# Patient Record
Sex: Female | Born: 1960
Health system: Southern US, Community
[De-identification: ages and names within clinical notes are randomized; demographics above are authoritative.]

## PROBLEM LIST (undated history)

## (undated) ENCOUNTER — Emergency Department (HOSPITAL_COMMUNITY): Discharge status: Absent without leave | Payer: Medicare HMO

## (undated) DIAGNOSIS — M545 Low back pain, unspecified: Secondary | ICD-10-CM

## (undated) DIAGNOSIS — F329 Major depressive disorder, single episode, unspecified: Secondary | ICD-10-CM

## (undated) DIAGNOSIS — K219 Gastro-esophageal reflux disease without esophagitis: Secondary | ICD-10-CM

## (undated) DIAGNOSIS — R011 Cardiac murmur, unspecified: Secondary | ICD-10-CM

## (undated) DIAGNOSIS — G43909 Migraine, unspecified, not intractable, without status migrainosus: Secondary | ICD-10-CM

## (undated) DIAGNOSIS — I10 Essential (primary) hypertension: Secondary | ICD-10-CM

## (undated) DIAGNOSIS — N390 Urinary tract infection, site not specified: Secondary | ICD-10-CM

## (undated) DIAGNOSIS — J45909 Unspecified asthma, uncomplicated: Secondary | ICD-10-CM

## (undated) DIAGNOSIS — Z9289 Personal history of other medical treatment: Secondary | ICD-10-CM

## (undated) DIAGNOSIS — K253 Acute gastric ulcer without hemorrhage or perforation: Secondary | ICD-10-CM

## (undated) DIAGNOSIS — N189 Chronic kidney disease, unspecified: Secondary | ICD-10-CM

## (undated) DIAGNOSIS — J189 Pneumonia, unspecified organism: Secondary | ICD-10-CM

## (undated) DIAGNOSIS — E119 Type 2 diabetes mellitus without complications: Secondary | ICD-10-CM

## (undated) DIAGNOSIS — G8929 Other chronic pain: Secondary | ICD-10-CM

## (undated) DIAGNOSIS — K224 Dyskinesia of esophagus: Secondary | ICD-10-CM

## (undated) DIAGNOSIS — F32A Depression, unspecified: Secondary | ICD-10-CM

## (undated) DIAGNOSIS — E7800 Pure hypercholesterolemia, unspecified: Secondary | ICD-10-CM

## (undated) DIAGNOSIS — M1990 Unspecified osteoarthritis, unspecified site: Secondary | ICD-10-CM

## (undated) DIAGNOSIS — F419 Anxiety disorder, unspecified: Secondary | ICD-10-CM

## (undated) DIAGNOSIS — B1920 Unspecified viral hepatitis C without hepatic coma: Secondary | ICD-10-CM

## (undated) DIAGNOSIS — IMO0002 Reserved for concepts with insufficient information to code with codable children: Secondary | ICD-10-CM

## (undated) DIAGNOSIS — D649 Anemia, unspecified: Secondary | ICD-10-CM

## (undated) HISTORY — PX: HEEL SPUR EXCISION: SHX1733

## (undated) HISTORY — PX: TONSILLECTOMY: SUR1361

## (undated) HISTORY — DX: Gastro-esophageal reflux disease without esophagitis: K21.9

## (undated) HISTORY — PX: TUBAL LIGATION: SHX77

## (undated) HISTORY — PX: ABDOMINAL HYSTERECTOMY: SHX81

## (undated) HISTORY — PX: APPENDECTOMY: SHX54

## (undated) HISTORY — DX: Unspecified osteoarthritis, unspecified site: M19.90

## (undated) HISTORY — DX: Acute gastric ulcer without hemorrhage or perforation: K25.3

## (undated) HISTORY — DX: Dyskinesia of esophagus: K22.4

## (undated) HISTORY — PX: CARPAL TUNNEL RELEASE: SHX101

## (undated) HISTORY — PX: FOOT FRACTURE SURGERY: SHX645

## (undated) HISTORY — PX: CARDIAC CATHETERIZATION: SHX172

## (undated) HISTORY — DX: Essential (primary) hypertension: I10

## (undated) HISTORY — DX: Anemia, unspecified: D64.9

## (undated) HISTORY — PX: CHOLECYSTECTOMY: SHX55

## (undated) HISTORY — DX: Reserved for concepts with insufficient information to code with codable children: IMO0002

---

## 1999-01-23 ENCOUNTER — Encounter: Payer: Self-pay | Admitting: Emergency Medicine

## 1999-01-23 ENCOUNTER — Emergency Department (HOSPITAL_COMMUNITY): Admission: EM | Admit: 1999-01-23 | Discharge: 1999-01-23 | Payer: Self-pay | Admitting: Emergency Medicine

## 1999-07-26 ENCOUNTER — Inpatient Hospital Stay (HOSPITAL_COMMUNITY): Admission: EM | Admit: 1999-07-26 | Discharge: 1999-07-28 | Payer: Self-pay | Admitting: Internal Medicine

## 1999-07-26 ENCOUNTER — Encounter (INDEPENDENT_AMBULATORY_CARE_PROVIDER_SITE_OTHER): Payer: Self-pay | Admitting: *Deleted

## 1999-07-27 ENCOUNTER — Encounter: Payer: Self-pay | Admitting: Family Medicine

## 1999-07-27 ENCOUNTER — Encounter: Payer: Self-pay | Admitting: Internal Medicine

## 1999-08-22 ENCOUNTER — Ambulatory Visit (HOSPITAL_COMMUNITY): Admission: RE | Admit: 1999-08-22 | Discharge: 1999-08-22 | Payer: Self-pay | Admitting: Gastroenterology

## 1999-11-06 ENCOUNTER — Emergency Department (HOSPITAL_COMMUNITY): Admission: EM | Admit: 1999-11-06 | Discharge: 1999-11-06 | Payer: Self-pay | Admitting: Emergency Medicine

## 1999-11-19 ENCOUNTER — Emergency Department (HOSPITAL_COMMUNITY): Admission: EM | Admit: 1999-11-19 | Discharge: 1999-11-19 | Payer: Self-pay | Admitting: *Deleted

## 1999-12-01 ENCOUNTER — Encounter: Payer: Self-pay | Admitting: Gastroenterology

## 1999-12-01 ENCOUNTER — Ambulatory Visit (HOSPITAL_COMMUNITY): Admission: RE | Admit: 1999-12-01 | Discharge: 1999-12-01 | Payer: Self-pay | Admitting: Gastroenterology

## 1999-12-08 ENCOUNTER — Encounter: Payer: Self-pay | Admitting: Emergency Medicine

## 1999-12-08 ENCOUNTER — Emergency Department (HOSPITAL_COMMUNITY): Admission: EM | Admit: 1999-12-08 | Discharge: 1999-12-09 | Payer: Self-pay | Admitting: Emergency Medicine

## 1999-12-15 ENCOUNTER — Ambulatory Visit (HOSPITAL_COMMUNITY): Admission: RE | Admit: 1999-12-15 | Discharge: 1999-12-15 | Payer: Self-pay | Admitting: Gastroenterology

## 1999-12-18 ENCOUNTER — Emergency Department (HOSPITAL_COMMUNITY): Admission: EM | Admit: 1999-12-18 | Discharge: 1999-12-19 | Payer: Self-pay | Admitting: Emergency Medicine

## 2000-01-19 ENCOUNTER — Emergency Department (HOSPITAL_COMMUNITY): Admission: EM | Admit: 2000-01-19 | Discharge: 2000-01-19 | Payer: Self-pay | Admitting: Emergency Medicine

## 2000-01-19 ENCOUNTER — Encounter: Payer: Self-pay | Admitting: Emergency Medicine

## 2000-04-05 ENCOUNTER — Ambulatory Visit (HOSPITAL_COMMUNITY): Admission: RE | Admit: 2000-04-05 | Discharge: 2000-04-05 | Payer: Self-pay | Admitting: Gastroenterology

## 2001-06-20 ENCOUNTER — Ambulatory Visit (HOSPITAL_COMMUNITY): Admission: RE | Admit: 2001-06-20 | Discharge: 2001-06-20 | Payer: Self-pay | Admitting: Gastroenterology

## 2001-06-25 ENCOUNTER — Emergency Department (HOSPITAL_COMMUNITY): Admission: EM | Admit: 2001-06-25 | Discharge: 2001-06-25 | Payer: Self-pay | Admitting: Emergency Medicine

## 2001-06-25 ENCOUNTER — Encounter: Payer: Self-pay | Admitting: Emergency Medicine

## 2001-10-21 ENCOUNTER — Encounter: Payer: Self-pay | Admitting: Emergency Medicine

## 2001-10-21 ENCOUNTER — Emergency Department (HOSPITAL_COMMUNITY): Admission: EM | Admit: 2001-10-21 | Discharge: 2001-10-22 | Payer: Self-pay | Admitting: Emergency Medicine

## 2001-10-22 ENCOUNTER — Encounter: Payer: Self-pay | Admitting: Emergency Medicine

## 2001-10-27 ENCOUNTER — Inpatient Hospital Stay (HOSPITAL_COMMUNITY): Admission: AD | Admit: 2001-10-27 | Discharge: 2001-10-27 | Payer: Self-pay | Admitting: Obstetrics and Gynecology

## 2001-12-04 ENCOUNTER — Encounter (INDEPENDENT_AMBULATORY_CARE_PROVIDER_SITE_OTHER): Payer: Self-pay | Admitting: *Deleted

## 2001-12-04 ENCOUNTER — Observation Stay (HOSPITAL_COMMUNITY): Admission: RE | Admit: 2001-12-04 | Discharge: 2001-12-05 | Payer: Self-pay | Admitting: Obstetrics and Gynecology

## 2002-07-31 ENCOUNTER — Ambulatory Visit (HOSPITAL_COMMUNITY): Admission: RE | Admit: 2002-07-31 | Discharge: 2002-07-31 | Payer: Self-pay | Admitting: Gastroenterology

## 2002-08-31 ENCOUNTER — Emergency Department (HOSPITAL_COMMUNITY): Admission: EM | Admit: 2002-08-31 | Discharge: 2002-08-31 | Payer: Self-pay | Admitting: Emergency Medicine

## 2003-07-01 ENCOUNTER — Encounter: Payer: Self-pay | Admitting: Gastroenterology

## 2003-07-01 ENCOUNTER — Ambulatory Visit (HOSPITAL_COMMUNITY): Admission: RE | Admit: 2003-07-01 | Discharge: 2003-07-01 | Payer: Self-pay | Admitting: Gastroenterology

## 2004-02-11 ENCOUNTER — Ambulatory Visit: Payer: Self-pay | Admitting: Gastroenterology

## 2004-02-16 ENCOUNTER — Ambulatory Visit (HOSPITAL_COMMUNITY): Admission: RE | Admit: 2004-02-16 | Discharge: 2004-02-16 | Payer: Self-pay | Admitting: Gastroenterology

## 2004-02-16 ENCOUNTER — Ambulatory Visit: Payer: Self-pay | Admitting: Gastroenterology

## 2004-03-15 ENCOUNTER — Ambulatory Visit: Payer: Self-pay | Admitting: Gastroenterology

## 2004-03-20 ENCOUNTER — Ambulatory Visit: Payer: Self-pay | Admitting: Gastroenterology

## 2004-03-20 ENCOUNTER — Ambulatory Visit (HOSPITAL_COMMUNITY): Admission: RE | Admit: 2004-03-20 | Discharge: 2004-03-20 | Payer: Self-pay | Admitting: Gastroenterology

## 2004-03-21 ENCOUNTER — Ambulatory Visit (HOSPITAL_COMMUNITY): Admission: RE | Admit: 2004-03-21 | Discharge: 2004-03-21 | Payer: Self-pay | Admitting: Gastroenterology

## 2004-04-05 ENCOUNTER — Ambulatory Visit: Payer: Self-pay | Admitting: Gastroenterology

## 2004-04-26 ENCOUNTER — Ambulatory Visit: Payer: Self-pay | Admitting: Gastroenterology

## 2004-05-11 ENCOUNTER — Ambulatory Visit: Payer: Self-pay | Admitting: Gastroenterology

## 2004-05-16 ENCOUNTER — Emergency Department (HOSPITAL_COMMUNITY): Admission: EM | Admit: 2004-05-16 | Discharge: 2004-05-16 | Payer: Self-pay | Admitting: Emergency Medicine

## 2004-05-16 ENCOUNTER — Ambulatory Visit (HOSPITAL_COMMUNITY): Admission: RE | Admit: 2004-05-16 | Discharge: 2004-05-16 | Payer: Self-pay | Admitting: Obstetrics & Gynecology

## 2004-05-16 ENCOUNTER — Ambulatory Visit: Payer: Self-pay | Admitting: Internal Medicine

## 2004-07-19 ENCOUNTER — Emergency Department (HOSPITAL_COMMUNITY): Admission: EM | Admit: 2004-07-19 | Discharge: 2004-07-19 | Payer: Self-pay | Admitting: Emergency Medicine

## 2006-01-20 ENCOUNTER — Emergency Department (HOSPITAL_COMMUNITY): Admission: EM | Admit: 2006-01-20 | Discharge: 2006-01-21 | Payer: Self-pay | Admitting: Internal Medicine

## 2006-05-02 ENCOUNTER — Ambulatory Visit: Payer: Self-pay | Admitting: Gastroenterology

## 2007-09-06 ENCOUNTER — Other Ambulatory Visit: Payer: Self-pay | Admitting: Family Medicine

## 2007-09-06 ENCOUNTER — Emergency Department (HOSPITAL_COMMUNITY): Admission: EM | Admit: 2007-09-06 | Discharge: 2007-09-06 | Payer: Self-pay | Admitting: Emergency Medicine

## 2008-02-04 ENCOUNTER — Encounter: Payer: Self-pay | Admitting: Gastroenterology

## 2008-03-04 ENCOUNTER — Encounter: Payer: Self-pay | Admitting: Gastroenterology

## 2008-03-09 ENCOUNTER — Telehealth: Payer: Self-pay | Admitting: Gastroenterology

## 2008-03-10 ENCOUNTER — Telehealth: Payer: Self-pay | Admitting: Gastroenterology

## 2008-03-10 DIAGNOSIS — J42 Unspecified chronic bronchitis: Secondary | ICD-10-CM

## 2008-03-10 DIAGNOSIS — B182 Chronic viral hepatitis C: Secondary | ICD-10-CM | POA: Insufficient documentation

## 2008-03-10 DIAGNOSIS — K224 Dyskinesia of esophagus: Secondary | ICD-10-CM

## 2008-03-10 DIAGNOSIS — I10 Essential (primary) hypertension: Secondary | ICD-10-CM

## 2008-03-10 DIAGNOSIS — M129 Arthropathy, unspecified: Secondary | ICD-10-CM

## 2008-03-10 HISTORY — DX: Dyskinesia of esophagus: K22.4

## 2008-03-10 HISTORY — DX: Essential (primary) hypertension: I10

## 2008-03-10 HISTORY — DX: Unspecified chronic bronchitis: J42

## 2008-03-10 HISTORY — DX: Arthropathy, unspecified: M12.9

## 2008-03-10 HISTORY — DX: Chronic viral hepatitis C: B18.2

## 2008-03-11 ENCOUNTER — Encounter: Payer: Self-pay | Admitting: Gastroenterology

## 2008-03-15 ENCOUNTER — Telehealth: Payer: Self-pay | Admitting: Gastroenterology

## 2008-03-15 ENCOUNTER — Ambulatory Visit: Payer: Self-pay | Admitting: Gastroenterology

## 2008-03-16 ENCOUNTER — Encounter: Payer: Self-pay | Admitting: Gastroenterology

## 2008-03-24 ENCOUNTER — Telehealth: Payer: Self-pay | Admitting: Gastroenterology

## 2008-03-25 ENCOUNTER — Ambulatory Visit (HOSPITAL_COMMUNITY): Admission: RE | Admit: 2008-03-25 | Discharge: 2008-03-25 | Payer: Self-pay | Admitting: Gastroenterology

## 2008-03-25 ENCOUNTER — Encounter: Payer: Self-pay | Admitting: Gastroenterology

## 2008-03-25 ENCOUNTER — Ambulatory Visit: Payer: Self-pay | Admitting: Gastroenterology

## 2008-03-26 ENCOUNTER — Telehealth: Payer: Self-pay | Admitting: Gastroenterology

## 2008-03-29 ENCOUNTER — Encounter: Payer: Self-pay | Admitting: Gastroenterology

## 2008-04-07 ENCOUNTER — Encounter: Payer: Self-pay | Admitting: Gastroenterology

## 2008-04-08 ENCOUNTER — Telehealth: Payer: Self-pay | Admitting: Gastroenterology

## 2008-04-23 ENCOUNTER — Ambulatory Visit: Payer: Self-pay | Admitting: Internal Medicine

## 2008-04-26 ENCOUNTER — Ambulatory Visit: Payer: Self-pay | Admitting: Gastroenterology

## 2008-04-29 ENCOUNTER — Ambulatory Visit (HOSPITAL_COMMUNITY): Admission: RE | Admit: 2008-04-29 | Discharge: 2008-04-29 | Payer: Self-pay | Admitting: Gastroenterology

## 2008-04-29 ENCOUNTER — Ambulatory Visit: Payer: Self-pay | Admitting: Gastroenterology

## 2008-06-14 ENCOUNTER — Ambulatory Visit: Payer: Self-pay | Admitting: Gastroenterology

## 2008-11-10 ENCOUNTER — Encounter: Payer: Self-pay | Admitting: Nurse Practitioner

## 2008-11-18 ENCOUNTER — Encounter: Payer: Self-pay | Admitting: Nurse Practitioner

## 2008-11-24 ENCOUNTER — Encounter: Payer: Self-pay | Admitting: Nurse Practitioner

## 2008-12-02 ENCOUNTER — Encounter: Payer: Self-pay | Admitting: Nurse Practitioner

## 2008-12-10 ENCOUNTER — Encounter: Payer: Self-pay | Admitting: Nurse Practitioner

## 2008-12-14 ENCOUNTER — Encounter: Payer: Self-pay | Admitting: Nurse Practitioner

## 2008-12-17 ENCOUNTER — Telehealth: Payer: Self-pay | Admitting: Gastroenterology

## 2008-12-20 ENCOUNTER — Ambulatory Visit: Payer: Self-pay | Admitting: Gastroenterology

## 2008-12-20 DIAGNOSIS — R112 Nausea with vomiting, unspecified: Secondary | ICD-10-CM

## 2008-12-20 DIAGNOSIS — R1031 Right lower quadrant pain: Secondary | ICD-10-CM | POA: Insufficient documentation

## 2008-12-20 HISTORY — DX: Nausea with vomiting, unspecified: R11.2

## 2008-12-20 LAB — CONVERTED CEMR LAB
Basophils Absolute: 0.1 10*3/uL (ref 0.0–0.1)
Basophils Relative: 0.6 % (ref 0.0–3.0)
Eosinophils Absolute: 0.2 10*3/uL (ref 0.0–0.7)
Eosinophils Relative: 1.4 % (ref 0.0–5.0)
HDL goal, serum: 40 mg/dL
Hemoglobin: 13.3 g/dL (ref 12.0–15.0)
Lymphocytes Relative: 27.9 % (ref 12.0–46.0)
Lymphs Abs: 3.2 10*3/uL (ref 0.7–4.0)
Monocytes Relative: 4.9 % (ref 3.0–12.0)
Neutro Abs: 7.3 10*3/uL (ref 1.4–7.7)
Neutrophils Relative %: 65.2 % (ref 43.0–77.0)

## 2009-01-24 ENCOUNTER — Ambulatory Visit: Payer: Self-pay | Admitting: Gastroenterology

## 2009-01-24 ENCOUNTER — Encounter (INDEPENDENT_AMBULATORY_CARE_PROVIDER_SITE_OTHER): Payer: Self-pay | Admitting: *Deleted

## 2009-01-24 DIAGNOSIS — R1310 Dysphagia, unspecified: Secondary | ICD-10-CM | POA: Insufficient documentation

## 2009-02-08 ENCOUNTER — Ambulatory Visit: Payer: Self-pay | Admitting: Gastroenterology

## 2009-02-08 ENCOUNTER — Ambulatory Visit (HOSPITAL_COMMUNITY): Admission: RE | Admit: 2009-02-08 | Discharge: 2009-02-08 | Payer: Self-pay | Admitting: Gastroenterology

## 2009-02-14 ENCOUNTER — Ambulatory Visit (HOSPITAL_COMMUNITY): Admission: RE | Admit: 2009-02-14 | Discharge: 2009-02-14 | Payer: Self-pay | Admitting: Gastroenterology

## 2009-02-14 ENCOUNTER — Telehealth: Payer: Self-pay | Admitting: Gastroenterology

## 2009-02-15 ENCOUNTER — Telehealth: Payer: Self-pay | Admitting: Gastroenterology

## 2009-03-03 ENCOUNTER — Telehealth: Payer: Self-pay | Admitting: Gastroenterology

## 2009-03-07 ENCOUNTER — Telehealth: Payer: Self-pay | Admitting: Gastroenterology

## 2009-03-08 ENCOUNTER — Telehealth: Payer: Self-pay | Admitting: Gastroenterology

## 2009-03-09 ENCOUNTER — Encounter: Payer: Self-pay | Admitting: Physician Assistant

## 2009-03-09 ENCOUNTER — Ambulatory Visit: Payer: Self-pay | Admitting: Gastroenterology

## 2009-03-09 DIAGNOSIS — R0789 Other chest pain: Secondary | ICD-10-CM

## 2009-03-09 DIAGNOSIS — R1310 Dysphagia, unspecified: Secondary | ICD-10-CM

## 2009-03-09 HISTORY — DX: Other chest pain: R07.89

## 2009-03-09 HISTORY — DX: Dysphagia, unspecified: R13.10

## 2009-03-15 ENCOUNTER — Telehealth: Payer: Self-pay | Admitting: Physician Assistant

## 2009-03-16 ENCOUNTER — Telehealth: Payer: Self-pay | Admitting: Gastroenterology

## 2009-03-17 ENCOUNTER — Ambulatory Visit: Payer: Self-pay | Admitting: Gastroenterology

## 2009-03-17 ENCOUNTER — Ambulatory Visit (HOSPITAL_COMMUNITY): Admission: RE | Admit: 2009-03-17 | Discharge: 2009-03-17 | Payer: Self-pay | Admitting: Gastroenterology

## 2009-03-18 ENCOUNTER — Telehealth: Payer: Self-pay | Admitting: Gastroenterology

## 2009-03-22 ENCOUNTER — Telehealth: Payer: Self-pay | Admitting: Gastroenterology

## 2009-07-25 ENCOUNTER — Ambulatory Visit: Payer: Self-pay | Admitting: Gastroenterology

## 2009-08-23 ENCOUNTER — Ambulatory Visit (HOSPITAL_COMMUNITY): Admission: RE | Admit: 2009-08-23 | Discharge: 2009-08-23 | Payer: Self-pay | Admitting: Gastroenterology

## 2009-08-23 ENCOUNTER — Ambulatory Visit: Payer: Self-pay | Admitting: Gastroenterology

## 2009-08-24 ENCOUNTER — Telehealth: Payer: Self-pay | Admitting: Gastroenterology

## 2009-08-25 ENCOUNTER — Ambulatory Visit: Payer: Self-pay | Admitting: Internal Medicine

## 2009-08-26 ENCOUNTER — Telehealth: Payer: Self-pay | Admitting: Nurse Practitioner

## 2009-09-02 ENCOUNTER — Telehealth: Payer: Self-pay | Admitting: Gastroenterology

## 2009-09-05 ENCOUNTER — Encounter: Payer: Self-pay | Admitting: Gastroenterology

## 2009-09-05 ENCOUNTER — Encounter: Payer: Self-pay | Admitting: Physician Assistant

## 2009-09-05 ENCOUNTER — Ambulatory Visit: Payer: Self-pay | Admitting: Internal Medicine

## 2009-09-05 DIAGNOSIS — R1013 Epigastric pain: Secondary | ICD-10-CM | POA: Insufficient documentation

## 2009-09-05 DIAGNOSIS — E1149 Type 2 diabetes mellitus with other diabetic neurological complication: Secondary | ICD-10-CM

## 2009-09-05 HISTORY — DX: Type 2 diabetes mellitus with other diabetic neurological complication: E11.49

## 2009-09-06 ENCOUNTER — Ambulatory Visit (HOSPITAL_COMMUNITY): Admission: RE | Admit: 2009-09-06 | Discharge: 2009-09-06 | Payer: Self-pay | Admitting: Internal Medicine

## 2009-09-07 ENCOUNTER — Telehealth: Payer: Self-pay | Admitting: Gastroenterology

## 2009-09-09 ENCOUNTER — Ambulatory Visit (HOSPITAL_COMMUNITY): Admission: RE | Admit: 2009-09-09 | Discharge: 2009-09-09 | Payer: Self-pay | Admitting: Gastroenterology

## 2009-09-09 ENCOUNTER — Telehealth: Payer: Self-pay | Admitting: Internal Medicine

## 2009-09-09 ENCOUNTER — Encounter: Payer: Self-pay | Admitting: Gastroenterology

## 2009-09-10 ENCOUNTER — Emergency Department (HOSPITAL_COMMUNITY): Admission: EM | Admit: 2009-09-10 | Discharge: 2009-09-11 | Payer: Self-pay | Admitting: Emergency Medicine

## 2009-09-10 ENCOUNTER — Telehealth: Payer: Self-pay | Admitting: Internal Medicine

## 2009-09-12 ENCOUNTER — Telehealth: Payer: Self-pay | Admitting: Gastroenterology

## 2009-09-15 ENCOUNTER — Telehealth: Payer: Self-pay | Admitting: Gastroenterology

## 2009-09-16 ENCOUNTER — Telehealth: Payer: Self-pay | Admitting: Gastroenterology

## 2009-10-07 ENCOUNTER — Telehealth: Payer: Self-pay | Admitting: Gastroenterology

## 2009-10-21 ENCOUNTER — Telehealth: Payer: Self-pay | Admitting: Gastroenterology

## 2009-11-16 ENCOUNTER — Telehealth: Payer: Self-pay | Admitting: Gastroenterology

## 2010-03-04 ENCOUNTER — Encounter: Payer: Self-pay | Admitting: Obstetrics and Gynecology

## 2010-03-05 ENCOUNTER — Encounter: Payer: Self-pay | Admitting: Gastroenterology

## 2010-03-14 NOTE — Progress Notes (Signed)
Summary: Med refill  Phone Note Refill Request Message from:  Fax from Pharmacy on October 07, 2009 2:06 PM  Refills Requested: Medication #1:  VICODIN 5-500 MG TABS 1 by mouth every 6 hours as needed for pain.   Dosage confirmed as above?Dosage Confirmed   Brand Name Necessary? No  Method Requested: Fax to Local Pharmacy Initial call taken by: Genella Mech CMA ([place]),  October 07, 2009 2:08 PM    New/Updated Medications: VICODIN 5-500 MG TABS (HYDROCODONE-ACETAMINOPHEN) 1 by mouth every 6 hours as needed for pain Prescriptions: VICODIN 5-500 MG TABS (HYDROCODONE-ACETAMINOPHEN) 1 by mouth every 6 hours as needed for pain  #30 x 0   Entered by:   Genella Mech CMA ([place])   Authorized by:   Inda Castle MD   Signed by:   Genella Mech CMA ([place]) on 10/07/2009   Method used:   Historical   RxIDLP:[id number]

## 2010-03-14 NOTE — Procedures (Signed)
Summary: Upper Endoscopy  Patient: [name] [name] Note: All result statuses are Final unless otherwise noted.  Tests: (1) Upper Endoscopy (EGD)   EGD Upper Endoscopy       DONE     [facility]     [place], [place]  [address]           ENDOSCOPY PROCEDURE REPORT           PATIENT:  [name], [name]  MR#:  [id number]     BIRTHDATE:  [birth year], 21 yrs. old  GENDER:  female           ENDOSCOPIST:  Sandy Salaam. Deatra Ina, MD     Referred by:           PROCEDURE DATE:  09/09/2009     PROCEDURE:  EGD with balloon dilatation, EGD w/botox injection     ASA CLASS:  Class II     INDICATIONS:  chest pain           MEDICATIONS:   Fentanyl 75 mcg IV, Versed 5 mg IV, glycopyrrolate     (Robinal) 0.2 mg IV, Benadryl 50 mg IV     TOPICAL ANESTHETIC:  Cetacaine Spray           DESCRIPTION OF PROCEDURE:   After the risks benefits and     alternatives of the procedure were thoroughly explained, informed     consent was obtained.  The  endoscope was introduced through the     mouth and advanced to the third portion of the duodenum, without     limitations.  The instrument was slowly withdrawn as the mucosa     was fully examined.           The upper, middle, and distal third of the esophagus were     carefully inspected and no abnormalities were noted. The z-line     was well seen at the GEJ. The endoscope was pushed into the fundus     which was normal including a retroflexed view. The antrum,gastric     body, first and second part of the duodenum were unremarkable.     balloon dilation botox injection 52mm for 30 seconds; minimal     resistance; no heme     10 units (1cc) injected submucosally every 1cm beginning at GE     junction. Total 100 units were injected.    Retroflexed views     revealed no abnormalities.    The scope was then withdrawn from     the patient and the procedure completed.           COMPLICATIONS:  None           ENDOSCOPIC IMPRESSION:     1)  Diffuse esophageal spasm - s/p balloon dilitation and botox     injection     RECOMMENDATIONS:OV 1 month           REPEAT EXAM:  No           ______________________________     Sandy Salaam. Deatra Ina, MD           CC:  [name] MD           n.     Lorrin MaisSandy Salaam. Monroe Qin at 09/09/2009 12:59 PM           [name] [name], [id number]  Note: An exclamation mark (!) indicates a result that was not dispersed into the flowsheet.  Document Creation Date: 09/09/2009 1:00 PM _______________________________________________________________________  (1) Order result status: Final Collection or observation date-time: 09/09/2009 12:55 Requested date-time:  Receipt date-time:  Reported date-time:  Referring Physician:   Ordering Physician: Erskine Emery [phone]) Specimen Source:  Source: Tawanna Cooler Order Number: [id number] Lab site:

## 2010-03-14 NOTE — Letter (Signed)
Summary: Diabetic Instructions  [facility]  [place], [place] [address]   Phone: [phone]  Fax: [phone]    [name] [name] April 23, 1960 MRN: [id number]   X   ORAL DIABETIC MEDICATION INSTRUCTIONS  The day before your procedure:   Take your diabetic pill as you do normally  The day of your procedure:   Do not take your diabetic pill    We will check your blood sugar levels during the admission process and again in Recovery before discharging you home  ________________________________________________________________________  _  _   INSULIN (LONG ACTING) MEDICATION INSTRUCTIONS (Lantus, NPH, 70/30, Humulin, Novolin-N)   The day before your procedure:   Take  your regular evening dose    The day of your procedure:   Do not take your morning dose    _  _   INSULIN (SHORT ACTING) MEDICATION INSTRUCTIONS (Regular, Humulog, Novolog)   The day before your procedure:   Do not take your evening dose   The day of your procedure:   Do not take your morning dose   _  _   INSULIN PUMP MEDICATION INSTRUCTIONS  We will contact the physician managing your diabetic care for written dosage instructions for the day before your procedure and the day of your procedure.  Once we have received the instructions, we will contact you.

## 2010-03-14 NOTE — Letter (Signed)
Summary: EGD Instructions  [facility]  [place], [place] [address]   Phone: [phone]  Fax: [phone]       [name] [name]    12-02-1960    MRN: [id number]       Procedure Day /Date:03-16-09     Arrival Time: 7:00 AM     Procedure Time: 8:00 AM     Location of Procedure:                     X    [facility] ( Outpatient Registration)    PREPARATION FOR ENDOSCOPY   On 03-16-09 THE DAY OF THE PROCEDURE:  1.   No solid foods, milk or milk products are allowed after midnight the night before your procedure.  2.   Do not drink anything colored red or purple.  Avoid juices with pulp.  No orange juice.  3.  You may drink clear liquids until 4:00 AM, which is 2 hours before your procedure.                                                                                                CLEAR LIQUIDS INCLUDE: Water Jello Ice Popsicles Tea (sugar ok, no milk/cream) Powdered fruit flavored drinks Coffee (sugar ok, no milk/cream) Gatorade Juice: apple, white grape, white cranberry  Lemonade Clear bullion, consomm, broth Carbonated beverages (any kind) Strained chicken noodle soup Hard Candy   MEDICATION INSTRUCTIONS  Unless otherwise instructed, you should take regular prescription medications with a small sip of water as early as possible the morning of your procedure.  Diabetic patients - see separate instructions.           OTHER INSTRUCTIONS  You will need a responsible adult at least 49 years of age to accompany you and drive you home.   This person must remain in the waiting room during your procedure.  Wear loose fitting clothing that is easily removed.  Leave jewelry and other valuables at home.  However, you may wish to bring a book to read or an iPod/MP3 player to listen to music as you wait for your procedure to start.  Remove all body piercing jewelry and leave at home.  Total time from sign-in until discharge is  approximately 2-3 hours.  You should go home directly after your procedure and rest.  You can resume normal activities the day after your procedure.  The day of your procedure you should not:   Drive   Make legal decisions   Operate machinery   Drink alcohol   Return to work  You will receive specific instructions about eating, activities and medications before you leave.    The above instructions have been reviewed and explained to me by   _______________________    I fully understand and can verbalize these instructions _____________________________ Date _________

## 2010-03-14 NOTE — Letter (Signed)
Summary: EGD Instructions  [facility]  [place], [place] [address]   Phone: [phone]  Fax: [phone]       [name] [name]    October 09, 1960    MRN: [id number]       Procedure Day /Date:FRIDAY 09/09/2009     Arrival Time: 11:30AM     Procedure Time:12:30PM     Location of Procedure:                     X  [facility] ( Outpatient Registration)   PREPARATION FOR ENDOSCOPY/BALLOON/BOTOX   On7/29/2011  THE DAY OF THE PROCEDURE:  1.   No solid foods, milk or milk products are allowed after midnight the night before your procedure.  2.   Do not drink anything colored red or purple.  Avoid juices with pulp.  No orange juice.  3.  You may drink clear liquids until8:30AM, which is 4  hours before your procedure.                                                                                                CLEAR LIQUIDS INCLUDE: Water Jello Ice Popsicles Tea (sugar ok, no milk/cream) Powdered fruit flavored drinks Coffee (sugar ok, no milk/cream) Gatorade Juice: apple, white grape, white cranberry  Lemonade Clear bullion, consomm, broth Carbonated beverages (any kind) Strained chicken noodle soup Hard Candy   MEDICATION INSTRUCTIONS  Unless otherwise instructed, you should take regular prescription medications with a small sip of water as early as possible the morning of your procedure.  Diabetic patients - see separate instructions.           OTHER INSTRUCTIONS  You will need a responsible adult at least 49 years of age to accompany you and drive you home.   This person must remain in the waiting room during your procedure.  Wear loose fitting clothing that is easily removed.  Leave jewelry and other valuables at home.  However, you may wish to bring a book to read or an iPod/MP3 player to listen to music as you wait for your procedure to start.  Remove all body piercing jewelry and leave at home.  Total time from sign-in  until discharge is approximately 2-3 hours.  You should go home directly after your procedure and rest.  You can resume normal activities the day after your procedure.  The day of your procedure you should not:   Drive   Make legal decisions   Operate machinery   Drink alcohol   Return to work  You will receive specific instructions about eating, activities and medications before you leave.    The above instructions have been reviewed and explained to me by   _______________________    I fully understand and can verbalize these instructions _____________________________ Date _________

## 2010-03-14 NOTE — Progress Notes (Signed)
Summary: Meds not working  Phone Note Call from Patient Call back at [employer] [phone]   Call For: Tye Savoy, NP Summary of Call: Medicine she was given yesterday is not working. Initial call taken by: Irwin Brakeman [facility],  August 26, 2009 10:28 AM  Follow-up for Phone Call        Per Nevin Bloodgood, she needs to continue the Carafate.  Nevin Bloodgood had me send to [facility] in [place] the Viscous Lidocaine 2 % , she is to take 5 cc, swish and swallow before each meal for 10 day and i sent 1 refill.  The pt understood this and I asked her to call me Mon or Tues of next week with a progress report. Follow-up by: Sharol Roussel,  August 26, 2009 11:22 AM    New/Updated Medications: LIDOCAINE VISCOUS 2 % SOLN (LIDOCAINE HCL) Take 5 cc and swish and swallow before each meal. Prescriptions: LIDOCAINE VISCOUS 2 % SOLN (LIDOCAINE HCL) Take 5 cc and swish and swallow before each meal.  #150 cc x 1   Entered by:   Marisue Humble NCMA   Authorized by:   Tye Savoy NP   Signed by:   Marisue Humble NCMA on 08/26/2009   Method used:   Electronically to        [facility] (retail)       [address]       [place], [place]  [address]       Ph: [phone]       Fax: [phone]   RxID:   [id number]

## 2010-03-14 NOTE — Progress Notes (Signed)
Summary: Triage  Phone Note Call from Patient Call back at [phone]   Caller: Patient Call For: Dr. Deatra Ina Reason for Call: Talk to Nurse Summary of Call: Pt has some questions about her procedure tomorrow. She has a ?cyst on her vagina and wants to know if it can be "popped". Initial call taken by: Webb Laws,  March 16, 2009 8:58 AM  Follow-up for Phone Call        No, she has been advised to contact her GYN. or PCP. Keep procedure as scheduled. Pt. instructed to call back as needed.  Follow-up by: Vivia Ewing LPN,  February  2, 624THL 10:09 AM

## 2010-03-14 NOTE — Progress Notes (Signed)
Summary: ON CALL - CHEST PAIN  Phone Note Call from Patient   Caller: Patient Call For: Dr. Deatra Ina Details for Reason: chest pain Summary of Call: patient called crying. c/o same chest pain. no other symptoms such as SOB, N/V, fvr, Bleeding, etc...told her that I have no idea why she has the complaint. Recommend ER eval ASAP Initial call taken by: Irene Shipper MD,  September 10, 2009 8:13 PM

## 2010-03-14 NOTE — Letter (Signed)
Summary: Diabetic Instructions  [facility]  [place], [place] [address]   Phone: [phone]  Fax: [phone]    [name] [name] 01/05/1961 MRN: [id number]   X   ORAL DIABETIC MEDICATION INSTRUCTIONS  The day before your procedure:   Take your diabetic pill as you do normally  The day of your procedure:   Do not take your diabetic pill    We will check your blood sugar levels during the admission process and again in Recovery before discharging you home  ________________________________________________________________________  _  _   INSULIN (LONG ACTING) MEDICATION INSTRUCTIONS (Lantus, NPH, 70/30, Humulin, Novolin-N)   The day before your procedure:   Take  your regular evening dose    The day of your procedure:   Do not take your morning dose    _  _   INSULIN (SHORT ACTING) MEDICATION INSTRUCTIONS (Regular, Humulog, Novolog)   The day before your procedure:   Do not take your evening dose   The day of your procedure:   Do not take your morning dose   _  _   INSULIN PUMP MEDICATION INSTRUCTIONS  We will contact the physician managing your diabetic care for written dosage instructions for the day before your procedure and the day of your procedure.  Once we have received the instructions, we will contact you.

## 2010-03-14 NOTE — Progress Notes (Signed)
Summary: Pt cancelled appointment  ---- Converted from flag ---- ---- 03/07/2009 12:24 PM, [name] [name] [facility] wrote: Pt cancelled appt today 03-07-09 2:45pm. Admitted at [facility] ------------------------------  no charge

## 2010-03-14 NOTE — Assessment & Plan Note (Signed)
Summary: CHEST PAIN  POST PROCEDURE        (DR.KAPLAN PT.)       [name] [name]   History of Present Illness Visit Type: Follow-up Visit Primary GI MD: Erskine Emery MD [facility] Primary Provider: [name], MD  Requesting Provider: n/a Chief Complaint: Hervey Ard pains in chest post procedure, patient choked when drinking History of Present Illness:   Patient known to Dr. Deatra Ina for chronic chest pain / history of esophageal spasm.  Patient has had numerous EGDs with balloon dilations as well as Botox injections, her last one being two days ago. Felt okay following procedure but later in the evening noticed she was "sore" in her chest. This was followed by intermittent sharp pains and sensation of "weight" on her chest. Taking a deep breath can cause the sharp pain but not on consistent basis. Patient has had these exact symptoms post-EGD before. Upon questioning, feels slightly SOB with exertion. No cough.   This am she "choked" on Kool-Aid, it "wouldn't go down".  Having some mild nausea but that pre-dates EGD.    GI Review of Systems    Reports chest pain and  dysphagia with liquids.      Denies abdominal pain, acid reflux, belching, bloating, dysphagia with solids, heartburn, loss of appetite, nausea, vomiting, vomiting blood, weight loss, and  weight gain.        Denies anal fissure, black tarry stools, change in bowel habit, constipation, diarrhea, diverticulosis, fecal incontinence, heme positive stool, hemorrhoids, irritable bowel syndrome, jaundice, light color stool, liver problems, rectal bleeding, and  rectal pain.    Current Medications (verified): 1)  Glimepiride 4 Mg Tabs (Glimepiride) .... One Tablet Once Daily 2)  Metformin Hcl 500 Mg Tabs (Metformin Hcl) .... One Tablet By Mouth Once Daily 3)  Tramadol Hcl 50 Mg Tabs (Tramadol Hcl) .... Take 1 Tab Every 6 Hours As Needed 4)  Omeprazole 20 Mg Cpdr (Omeprazole) .... Take 1 Tab 30 Min Prior To Breakfast 5)  Isosorbide Dinitrate 30 Mg Tabs  (Isosorbide Dinitrate) .... Take 1 Tablet Once Daily For Chest Pain  Allergies (verified): 1)  ! Codeine  Past History:  Past Medical History: Reviewed history from 03/10/2008 and no changes required. 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 Cholecystectomy Hysterectomy Tonsillectomy  Family History: Reviewed history from 12/20/2008 and no changes required. Family History of Diabetes: Brother, Aunt, and Grandma  No FH of Colon Cancer:  Social History: Reviewed history from 07/25/2009 and no changes required. Patient has never smoked.  Alcohol Use - no Illicit Drug Use - no Occupation: Glass blower/designer Married  2 girls  Review of Systems       The patient complains of shortness of breath and sleeping problems.  The patient denies allergy/sinus, anemia, anxiety-new, arthritis/joint pain, back pain, blood in urine, breast changes/lumps, change in vision, confusion, cough, coughing up blood, depression-new, fainting, fatigue, fever, headaches-new, hearing problems, heart murmur, heart rhythm changes, itching, menstrual pain, muscle pains/cramps, night sweats, nosebleeds, pregnancy symptoms, skin rash, sore throat, swelling of feet/legs, swollen lymph glands, thirst - excessive, urination - excessive, urination changes/pain, urine leakage, vision changes, and voice change.    Vital Signs:  Patient profile:   50 year old female Height:      65 inches Weight:      212.38 pounds BMI:     35.47  Pulse rate:   80 / minute Pulse rhythm:   regular BP sitting:   108 / 74  (left arm) Cuff size:   regular  Vitals Entered By: [name] [name]) (August 25, 2009 2:09 PM)  Physical Exam  General:  Well developed, well nourished, no acute  distress. Head:  Normocephalic and atraumatic. Eyes:  Conjunctiva pink, no icterus.  Neck:  no obvious masses  Lungs:  Clear throughout to auscultation. Heart:  Regular rate and rhythm, positive murmur Abdomen:  Abdomen soft, nontender, nondistended. No obvious masses or hepatomegaly.Normal bowel sounds.  Msk:  Moderate chest wall tenderness localized to small area of left chest Neurologic:  Alert and  oriented x4;  grossly normal neurologically. Skin:  Intact without significant lesions or rashes. No crepitus. Cervical Nodes:  No significant cervical adenopathy. Psych:  Alert and cooperative. Normal mood and affect.   Impression & Recommendations:  Problem # 1:  CHEST PAIN (ICD-786.50) Assessment Deteriorated Chronic chest pain, history of esophageal spasms. Having intermittent sharp pains since EGD with balloon dilation and Botox injection yesterday. Patient tells me she has had these same symptoms post-procedure in the past. Patient looks fine, no chest crepitus on exam, no dyspnea or fevers. Some of her discomfort may be from the injection, nothing on exam suggests perforation. Area of localized tenderness to left chest. She may have musculoskeletal pain. Trial of Carafate, will make some temporary diet modifications. Follow up with Dr. Deatra Ina to be made per post-EGD recommendations. Patient knows to head to ER for severe chest pain, SOB, fevers.   Problem # 2:  DYSPHAGIA (ICD-787.29) Clear liquids for 24 hours then small bites of soft food for next 1-2 days.  Patient Instructions: 1)  Clear liquids for 24 hours then small bites of soft food for next 1-2 days. 2)  We faxed a prescription for Carafate Slurry to [facility]. 3)  If you get shortness of breath, severe chest pain go to the Emergency Room. 4)  We made you an appointment to see Dr. Deatra Ina for 09-20-09 at 10:15 PM.  5)  Copy sent to : [name] 6)  The medication list was reviewed and reconciled.  All changed /  newly prescribed medications were explained.  A complete medication list was provided to the patient / caregiver. Prescriptions: CARAFATE SLURRY Take 1 GM 4 times daily  #120 x 0   Entered by:   Marisue Humble NCMA   Authorized by:   Tye Savoy NP   Signed by:   Marisue Humble NCMA on 08/25/2009   Method used:   Faxed to ...       [facility] [address]* (retail)       [address]       [place], [place]  [address]       Ph: [phone]       Fax: [phone]   RxID:   [id number]

## 2010-03-14 NOTE — Assessment & Plan Note (Signed)
Summary: F/U AFTER PROCEDURE...EM   History of Present Illness Visit Type: Follow-up Visit Primary GI MD: Erskine Emery MD [facility] Primary Provider: [name], MD  Requesting Provider: n/a Chief Complaint: follow-up ECL History of Present Illness:   [name] [name] has returned with recurrent dysphagia and chest discomfort.  She has a nonspecific motility disorder that responds to combination of balloon dilatation and Botox.  Her last treatment session was in early February.  She is having typical chest discomfort with swallowing and dysphagia to both solids and liquids   GI Review of Systems    Reports abdominal pain, chest pain, and  dysphagia with liquids.      Denies acid reflux, belching, bloating, dysphagia with solids, heartburn, loss of appetite, nausea, vomiting, vomiting blood, weight loss, and  weight gain.        Denies anal fissure, black tarry stools, change in bowel habit, constipation, diarrhea, diverticulosis, fecal incontinence, heme positive stool, hemorrhoids, irritable bowel syndrome, jaundice, light color stool, liver problems, rectal bleeding, and  rectal pain.    Current Medications (verified): 1)  Glimepiride 4 Mg Tabs (Glimepiride) .... One Tablet Once Daily 2)  Metformin Hcl 500 Mg Tabs (Metformin Hcl) .... One Tablet By Mouth Once Daily 3)  Tramadol Hcl 50 Mg Tabs (Tramadol Hcl) .... Take 1 Tab Every 6 Hours As Needed 4)  Omeprazole 20 Mg Cpdr (Omeprazole) .... Take 1 Tab 30 Min Prior To Breakfast  Allergies (verified): 1)  ! Codeine  Past History:  Past Medical History: Reviewed history from 03/10/2008 and no changes required. 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 Cholecystectomy Hysterectomy Tonsillectomy  Family History: Reviewed history from 12/20/2008 and no changes required. Family History of Diabetes: Brother, Aunt, and Grandma  No FH of Colon Cancer:  Social History: Reviewed history from 03/15/2008 and no changes required. Patient has never smoked.  Alcohol Use - no Illicit Drug Use - no Occupation: Glass blower/designer Married  2 girls  Review of Systems  The patient denies allergy/sinus, anemia, anxiety-new, arthritis/joint pain, back pain, blood in urine, breast changes/lumps, change in vision, confusion, cough, coughing up blood, depression-new, fainting, fatigue, fever, headaches-new, hearing problems, heart murmur, heart rhythm changes, itching, menstrual pain, muscle pains/cramps, night sweats, nosebleeds, pregnancy symptoms, shortness of breath, skin rash, sleeping problems, sore throat, swelling of feet/legs, swollen lymph glands, thirst - excessive , urination - excessive , urination changes/pain, urine leakage, vision changes, and voice change.    Vital Signs:  Patient profile:   50 year old female Height:      65 inches Weight:      222 pounds BMI:     37.08 Pulse rate:   84 / minute Pulse rhythm:   regular BP sitting:   158 / 98  (left arm)  Vitals Entered By: Randye Lobo NCMA (July 25, 2009 3:10 PM)   Impression & Recommendations:  Problem # 1:  CHEST PAIN (ICD-786.50)  Plan repeat endoscopy with Botox injection and balloon dilatation  Orders: ZENDO with Botox (ZENDO/Botox) ZEGD Balloon Dil (ZEGD Balloon)  Patient Instructions: 1)  Copy sent to : [name], MD  2)  Your EGD is scheduled at [facility] on 08/23/2009 at 9am 3)  You have been instructed on diabetic meds 4)  The medication list was reviewed and reconciled.  All changed / newly prescribed medications were  explained.  A complete medication list was provided to the patient / caregiver.

## 2010-03-14 NOTE — Progress Notes (Signed)
Summary: Condition Update  Phone Note Outgoing Call   Call placed by: Vivia Ewing LPN,  January  4, 624THL 9:00 AM Call placed to: Patient Summary of Call: Condition update--(See triage and BA Swallow report from 02-14-09) Pt. continues to have chest pain, somewhat better this morning. Pt. instructed to stay on a full liquid diet for 24 hours and slowly advance diet back to normal, as tolerated. Use pain meds as needed. If symptoms become worse call back immediately or go to ER. Pt. instructed to call back as needed.  Initial call taken by: Vivia Ewing LPN,  January  4, 624THL 9:03 AM  Follow-up for Phone Call        xray looks ok agree with plan Follow-up by: Inda Castle MD,  February 15, 2009 10:52 AM

## 2010-03-14 NOTE — Progress Notes (Signed)
Summary: Date change for EGD @ [facility].  Phone Note Call from Patient   Caller: Patient Call For: [facility] Summary of Call: Called pt at her Mother's number, [phone] and LM for the pt to please call me.  The pt did call me back this AM and I did let her know the [facility] ENdo Balloon Dil is schedule for Thurs 03-17-09 at 8Am. She would need to arrive at 6:45Am.  When pt was here and saw Rielle Schlauch PA-C on 03-09-09, we scheduled the procedure for  03-16-09 but we had to change it to 03-17-09 due to a meeting Dr. Deatra Ina has to attend. The pt was fine with the date change.  Initial call taken by: Sharol Roussel,  March 15, 2009 10:26 AM

## 2010-03-14 NOTE — Progress Notes (Signed)
Summary: Triage  Phone Note Call from Patient Call back at [phone]   Caller: Patient Call For: Dr. Deatra Ina Reason for Call: Talk to Nurse Summary of Call: Pt. is requesting to speak directly to nurse Initial call taken by: Webb Laws,  October 21, 2009 9:31 AM  Follow-up for Phone Call        Answering machine is in spanish, Georgette Shell left a message for pt. to callback. Pt. home# [phone] and cell# [phone] are not accepting calls.  No answer at [phone]. Pt's daughters# [phone] is unavailable. I left a message w/pt's mother at [phone], for pt. to callback.  Follow-up by: Vivia Ewing LPN,  September  9, 624THL 9:59 AM  Additional Follow-up for Phone Call Additional follow up Details #1::        Pt. calling to see if Dr.Kaplan can do a Myotomy. I let her know that is a surgical procedure and she will need to discuss this w/Dr.Kock at [facility] when she goes to see him. Pt. instructed to call back as needed.  Additional Follow-up by: Vivia Ewing LPN,  September  9, 624THL 10:07 AM

## 2010-03-14 NOTE — Progress Notes (Signed)
Summary: Triage  Phone Note Call from Patient Call back at Home Phone [phone]   Caller: Patient Call For: Dr. Deatra Ina Reason for Call: Talk to Nurse Summary of Call: Seen in ER on 09-11-09 and still having burning in esophagus and abd. Initial call taken by: Webb Laws,  September 12, 2009 8:47 AM  Follow-up for Phone Call        Pt. has 2 charts as the hospital one which she was seen under yesterday is listed under [facility]mie Evens. Pollina.EMR team has been contacted to combine charts. She was given PEPCID at ER and told to call here today. Follow-up by: Abel Presto RN,  September 12, 2009 10:33 AM  Additional Follow-up for Phone Call Additional follow up Details #1::        Can d/c pepcid, continue carafate and omeprazole. Try hyomax 0.375mg  two times a day as needed abdominal pain. Additional Follow-up by: Inda Castle MD,  September 12, 2009 11:34 AM    Additional Follow-up for Phone Call Additional follow up Details #2::    Pt. ntfd. of Dr.Rachell Druckenmiller's orders and new rx. sent to pharmacy. Follow-up by: Abel Presto RN,  September 12, 2009 11:54 AM  New/Updated Medications: HYOMAX-SR 0.375 MG XR12H-TAB (HYOSCYAMINE SULFATE) Take 1 p.o. twice a day as needed for abd. pain Prescriptions: HYOMAX-SR 0.375 MG XR12H-TAB (HYOSCYAMINE SULFATE) Take 1 p.o. twice a day as needed for abd. pain  #60 x 3   Entered by:   Abel Presto RN   Authorized by:   Inda Castle MD   Signed by:   Abel Presto RN on 09/12/2009   Method used:   Electronically to        [facility].* (retail)       [address]       [place], [place]  [address]       Ph: [phone]       Fax: [phone]   RxID:   [id number]

## 2010-03-14 NOTE — Progress Notes (Signed)
Summary: TRIAGE  Phone Note Call from Patient Call back at [phone]   Caller: Patient Call For: Dr. Deatra Ina Reason for Call: Talk to Nurse Summary of Call: Pt is having Esophagus and chest pain. Been to [facility] ER and needs an appt. with [facility] ASAP Initial call taken by: Webb Laws,  March 03, 2009 10:13 AM  Follow-up for Phone Call        Pt. c/o chest pain, worse yesterday, she went to [facility] ER last night, was told it is her Esophagus. Pt. feels somewhat better this morning.  1) See Dr.Zakariyah Freimark on 03-07-09 at 2:45pm 2) Bring ER records to appt. 3) Increase Omeprazole to two times a day until Appt. 4) Soft,bland diet. No spicy,greasy,fried foods.  5) If symptoms become worse call back immediately or go to ER.  Follow-up by: Vivia Ewing LPN,  January 20, 624THL 10:35 AM  Additional Follow-up for Phone Call Additional follow up Details #1::        ok Additional Follow-up by: Inda Castle MD,  March 03, 2009 10:41 AM

## 2010-03-14 NOTE — Progress Notes (Signed)
Summary: F/U From ER Last Night  Phone Note Call from Patient Call back at Home Phone [phone]   Caller: Patient Call For: Dr. Deatra Ina  Reason for Call: Talk to Nurse Summary of Call: pt says she was told by hospital to move her f/u appt up... currently sch'ed for August 9th Initial call taken by: Lucien Mons,  September 02, 2009 9:36 AM  Follow-up for Phone Call        Pt. saw Tye Savoy NP 08-25-09. She went to the ER at [facility] last night because she got chicken stuck in her esophagus. She states they gave her NTG SL and had her drink water and the chicken finally went down.   1) See Amy Esterwood PAC on 09-05-09 at 10:30am 2) Full liquids and very soft foods, no meats,breads or rice. 3) If symptoms become worse call back immediately or go to ER.  (I have requested the records from [facility] ER be faxed to me.) Follow-up by: Vivia Ewing LPN,  July 22, 624THL 579FGE AM

## 2010-03-14 NOTE — Progress Notes (Signed)
Summary: Triage  Phone Note Call from Patient Call back at Home Phone [phone] Call back at [phone]   Call For: Dr Deatra Ina Summary of Call: Medicine is making her fall asleep. Initial call taken by: Irwin Brakeman [facility],  September 15, 2009 10:37 AM  Follow-up for Phone Call        Pt. began Hyomax on 09-12-09. Pt. states,"It just puts me to sleep, when I wake up I 'm still hurting."  Pt. is scheduled to see Dr.Koch on 12-07-09.  [facility] PLEASE ADVISE  Follow-up by: Vivia Ewing LPN,  August  4, 624THL 12:33 PM  Additional Follow-up for Phone Call Additional follow up Details #1::        try hyomax 0.125mg  s.l. q4h prn Additional Follow-up by: Inda Castle MD,  September 15, 2009 1:47 PM    Additional Follow-up for Phone Call Additional follow up Details #2::    Above MD orders reviewed with patient. Med to pharmacy. Pt. requests I try to get a sooner appt. for her with Dr.Koch. Per Sharl Ma w/Dr.Koch, she will have Dr.Koch review pt. records and advise. They will contact pt. with sooner appt. information. Pt. instructed to call back as needed.  Follow-up by: Vivia Ewing LPN,  August  4, 624THL 2:36 PM  New/Updated Medications: HYOMAX-SL 0.125 MG SUBL (HYOSCYAMINE SULFATE) Put 1 under your tongue every 4 hours as needed. Prescriptions: HYOMAX-SL 0.125 MG SUBL (HYOSCYAMINE SULFATE) Put 1 under your tongue every 4 hours as needed.  #30 x 1   Entered by:   Vivia Ewing LPN   Authorized by:   Inda Castle MD   Signed by:   Vivia Ewing LPN on 624THL   Method used:   Electronically to        [facility].* (retail)       [address]       [place], [place]  [address]       Ph: [phone]       Fax: [phone]   RxID:   [id number]

## 2010-03-14 NOTE — Procedures (Signed)
Summary: Upper Endoscopy  Patient: [name] [name] Note: All result statuses are Final unless otherwise noted.  Tests: (1) Upper Endoscopy (EGD)   EGD Upper Endoscopy       DONE     [facility]     [place], [place]  [address]           ENDOSCOPY PROCEDURE REPORT           PATIENT:  [name], [name]  MR#:  [id number]     BIRTHDATE:  Jun 12, 1960, 65 yrs. old  GENDER:  female           ENDOSCOPIST:  Sandy Salaam. Deatra Ina, MD     Referred by:           PROCEDURE DATE:  08/23/2009     PROCEDURE:  EGD with balloon dilatation, EGD w/botox injection     ASA CLASS:  Class II     INDICATIONS:  chest pain h/o esophageal spasm           MEDICATIONS:   Fentanyl 125 mcg IV, Versed 12.5 mg IV, Benadryl 50     mg IV, glycopyrrolate (Robinal) 0.2 mg IV     TOPICAL ANESTHETIC:  Cetacaine Spray           DESCRIPTION OF PROCEDURE:   After the risks benefits and     alternatives of the procedure were thoroughly explained, informed     consent was obtained.  The  endoscope was introduced through the     mouth and advanced to the third portion of the duodenum, without     limitations.  The instrument was slowly withdrawn as the mucosa     was fully examined.           The upper, middle, and distal third of the esophagus were     carefully inspected and no abnormalities were noted. The z-line     was well seen at the GEJ. The endoscope was pushed into the fundus     which was normal including a retroflexed view. The antrum,gastric     body, first and second part of the duodenum were unremarkable.     balloon dilation botox injection 15-16.5-18 mild resistance; no     heme     10 units (1cc) were injected every 1 cm beginning at GE junction     and moving proximally. Total 10cc (100 units) were injected.     Retroflexed views revealed no abnormalities.    The scope was then     withdrawn from the patient and the procedure completed.     COMPLICATIONS:  None           ENDOSCOPIC  IMPRESSION:     1) esophageal spasm - s/p balloon dilitation and botox injection           RECOMMENDATIONS:     1) Call office next 2-3 days to schedule an office appointment     for 6 weeks           REPEAT EXAM:  prn           ______________________________     Sandy Salaam. Deatra Ina, MD           CC:  [name] MD           n.     Lorrin MaisSandy Salaam. Christna Kulick at 08/23/2009 10:26 AM           [name] [name],  [id number]  Note: An exclamation mark (!) indicates a result that was not dispersed into the flowsheet. Document Creation Date: 08/23/2009 10:27 AM _______________________________________________________________________  (1) Order result status: Final Collection or observation date-time: 08/23/2009 10:18 Requested date-time:  Receipt date-time:  Reported date-time:  Referring Physician:   Ordering Physician: Erskine Emery [id number]) Specimen Source:  Source: Tawanna Cooler Order Number: [id number] Lab site:

## 2010-03-14 NOTE — Procedures (Signed)
Summary: Upper Endoscopy w/DIL  Patient: [name] [name] Note: All result statuses are Final unless otherwise noted.  Tests: (1) Upper Endoscopy w/DIL (UED)  UED Upper Endoscopy w/DIL                             DONE     [facility]     [place], [place]  [address]           ENDOSCOPY PROCEDURE REPORT           PATIENT:  [name] [name], [name] [name]  MR#:  [id number]     BIRTHDATE:  [birth year], 45 yrs. old  GENDER:  female           ENDOSCOPIST:  Sandy Salaam. Deatra Ina, MD     ASSISTANT:           PROCEDURE DATE:  03/17/2009     PROCEDURE:  EGD with balloon dilatation, EGD w/ submucosal     injection     ASA CLASS:  Class II     INDICATIONS:  1) chest pain           MEDICATIONS:   Fentanyl 75 mcg IV, Versed 7 mg IV, Benadryl 50 mg     IV, glycopyrrolate (Robinal) 0.2 mg IV     TOPICAL ANESTHETIC:  Cetacaine Spray           DESCRIPTION OF PROCEDURE:   After the risks benefits and     alternatives of the procedure were thoroughly explained, informed     consent was obtained.  The  endoscope was introduced through the     mouth and advanced to the second portion of the duodenum, without     limitations.  The instrument was slowly withdrawn as the mucosa     was carefully examined.     <<PROCEDUREIMAGES>>           Retained food was present (see image1).  The examination was     otherwise normal. botox injection 15-16.5-78mm balloon dilators     (8cm in length) were inflated in lower 1/3 of esophagus and across     the GE junction. Following dilitation 10 units of botox (1cc) was     injected at 1cc intervals beginning at the GE junction and     extending proximally    Dilation was then performed at the           1) Dilator:  Balloon  Size(s):  15-16.5-18     Resistance:  minimal  Heme:  none     Appearance:           COMPLICATIONS:  None           ENDOSCOPIC IMPRESSION:     1) Food, retained     2) Esophageal spasm - s/p dilitation and botox injection  RECOMMENDATIONS:     1) call office to schedule an office visit for _3weeks           REPEAT EXAM:  No           ______________________________     Sandy Salaam. Deatra Ina, MD           CC:           n.     eSIGNED:   Sandy Salaam. Kaplan at 03/17/2009 08:18 AM           [name] [name], [id number]  Note: An exclamation  mark (!) indicates a result that was not dispersed into the flowsheet. Document Creation Date: 03/17/2009 8:19 AM _______________________________________________________________________  (1) Order result status: Final Collection or observation date-time: 03/17/2009 08:12 Requested date-time:  Receipt date-time:  Reported date-time:  Referring Physician:   Ordering Physician: Erskine Emery [phone]) Specimen Source:  Source: Tawanna Cooler Order Number: [id number] Lab site:   Appended Document: Upper Endoscopy w/DIL Called to remind pt. she needs to schedule a f/u appt. with Dr.Kaplan. She will check her work calendar and callback to schedule appt.

## 2010-03-14 NOTE — Progress Notes (Signed)
Summary: Appt. w/Dr.Koch  ---- Converted from flag ---- ---- 09/07/2009 11:26 AM, Inda Castle MD wrote: yes  ---- 09/07/2009 11:08 AM, Vivia Ewing LPN wrote: Does she need to keep her appt. for and endo. with you on Friday?  ---- 09/07/2009 11:07 AM, Inda Castle MD wrote: The patient remains symptomatic despite medical therapy.  That is make an appointment for her to see Dr. Nash Dimmer at [facility]  ---- 09/06/2009 8:49 PM, Amy Genia Harold PA-c wrote: Derrek Monaco REVIEW MY NOTE AND LOOK AT BARIUM SWALLOW/UGI--GUESS SHE PROBABLY DOES NOT NEED A REPEAT BOTOX/OR DIL NOW THOUGH STILL WITH MAJOR SXS.... ------------------------------  Phone Note Outgoing Call Call back at [facility] Phone [phone] Call back at Work Phone [phone]   Call placed by: Vivia Ewing LPN,  July 27, 624THL 075-GRM AM Summary of Call: Records faxed to [facility] for appt.  Message left for patient to callback.   Initial call taken by: Vivia Ewing LPN,  July 27, 624THL [id number] AM  Follow-up for Phone Call        Pt. advised I will notify her with appt. information for Dr.Koch. She will keep her Endo. appt. on 09-09-09 w/Dr.Aadi Bordner. Pt. instructed to call back as needed.  Follow-up by: Vivia Ewing LPN,  July 27, 624THL [id number] PM     Appended Document: Appt. w/Dr.Koch Pt's appt. with Dr.Koch is scheduled for 12-07-09 at 2pm, they will mail her an appt. packet. Pt's sister,Kathy, will give pt. the appt. information. Pt. instructed to call back as needed.

## 2010-03-14 NOTE — Assessment & Plan Note (Signed)
Summary: POST ER ON 09-01-09, FOOD IMPACTION.        (DR.KAPLAN PT.)   ...   History of Present Illness Visit Type: Follow-up Visit Primary GI MD: Erskine Emery MD [facility] Primary Provider: [name], MD Requesting Provider: n/a Chief Complaint: dysphagia, seen in ER on 7/21, she is still having problems with solids and liquids History of Present Illness:   [name] [name] 50 YO FEMALE KNOWN TO DR. KAPLAN WITH DX OF DIFFUSE ESOPHAGEAL SPASM. SHE HAS UNDERGONE CARDIAC EVALUATION EARLIER THIS YEAR WITH NEGATIVE CATHERIZATION. SHE HAS HAD MULTIPLE EGD'S WITH BALOON DILATIONS AND BOTOX INJECTIONS OVER THE PAST COUPLE YEARS. SHE MOST RECENTLY HAD AN EGD ON 7/12 WITH BALLOON TO 18 MM AND BOTOX TO GE JUNCTION. SHE SAYS SHE USUALLY HURTS FOR A COUPLE WEEKS AFTER THE PROCEDURE . THIS LAST TIME SHE HURT WORSE-WAS SEEN IN THE OFFICE ON 7/14 BY N.P.,ADDED CARAFATE TO REGIMEN. SHE WENT TO THE E.R ON 7/23 AFTER SHE CHOKED ON CHICKEN. SHE WAS GIVEN  NTG S.L WHICH HELPED AND SXS RESOLVED. SHE SAYS SHE HAS BEEN CHOKING OFF AND ON WITH SOLIDS AND LIQUIDS THE PAST COUPLE WEEKS.YESTERDAY SHE HAD A SPASM WITH SALIVA.SHE IS EATING VERY SOFT FOODS WHICH GENERALLY GO DOWN WELL. SHE HAS IMDUR AT HOME WHICH HAS ONLY BEEN TAKING as needed,IT GIVED HER A HEADACHE AND DOESN'T HELP. SHE DOES NOT FEEL SHE COULD TOLERATE DAILY DOSING DUE TO HEADACHES.   GI Review of Systems    Reports abdominal pain, belching, bloating, chest pain, dysphagia with liquids, and  dysphagia with solids.     Location of  Abdominal pain: upper abdomen.    Denies acid reflux, heartburn, loss of appetite, nausea, vomiting, vomiting blood, and  weight loss.        Denies anal fissure, black tarry stools, change in bowel habit, constipation, diarrhea, diverticulosis, fecal incontinence, heme positive stool, hemorrhoids, irritable bowel syndrome, jaundice, light color stool, liver problems, rectal bleeding, and  rectal pain.    Current Medications  (verified): 1)  Glimepiride 4 Mg Tabs (Glimepiride) .... One Tablet Once Daily 2)  Metformin Hcl 500 Mg Tabs (Metformin Hcl) .... One Tablet By Mouth Once Daily 3)  Tramadol Hcl 50 Mg Tabs (Tramadol Hcl) .... Take 1 Tab Every 6 Hours As Needed 4)  Omeprazole 20 Mg Cpdr (Omeprazole) .... Take 1 Tab 30 Min Prior To Breakfast 5)  Isosorbide Dinitrate 30 Mg Tabs (Isosorbide Dinitrate) .... Take 1 Tablet Once Daily For Chest Pain 6)  Carafate 1 Gm/63ml Susp (Sucralfate) .... Take 1 Gram By Mouth Four Times A Day 7)  Lidocaine Viscous 2 % Soln (Lidocaine Hcl) .... Take 5 Cc and Swish and Swallow Before Each Meal.  Allergies (verified): 1)  ! Codeine  Past History:  Past Medical History: Reviewed history from 03/10/2008 and no changes required. 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 Family History of Diabetes: Brother, Aunt, and Grandma  No FH of Colon Cancer:  Social History: Reviewed history from 07/25/2009 and no changes required. Patient has never smoked.  Alcohol Use - no Illicit Drug Use - no Occupation: Glass blower/designer Married  2 girls  Review of Systems  The patient denies allergy/sinus, anemia, anxiety-new, arthritis/joint pain, back pain, blood in urine, breast changes/lumps, confusion, cough, coughing up blood, depression-new, fainting, fatigue, fever, headaches-new, hearing  problems, heart murmur, heart rhythm changes, itching, menstrual pain, muscle pains/cramps, night sweats, nosebleeds, pregnancy symptoms, shortness of breath, skin rash, sleeping problems, sore throat, swelling of feet/legs, swollen lymph glands, thirst - excessive, urination -  excessive, urination changes/pain, urine leakage, vision changes, and voice change.         OTHERWISE SEE HPI  Vital Signs:  Patient profile:   50 year old female Height:      65 inches Weight:      212 pounds BMI:     35.41 Pulse rate:   84 / minute Pulse rhythm:   regular BP sitting:   110 / 76  (left arm) Cuff size:   large  Vitals Entered By: Abelino Derrick CMA [name]) (September 05, 2009 9:30 AM)  Physical Exam  General:  Well developed, well nourished, no acute distress. Head:  Normocephalic and atraumatic. Eyes:  PERRLA, no icterus. Neck:  Supple; no masses or thyromegaly. Lungs:  Clear throughout to auscultation. Heart:  Regular rate and rhythm; no murmurs, rubs,  or bruits. Abdomen:  SOFT, MINIMALLY TENDER UPPER ABDOMEN, NO GUARDING, NO MASS OR HSM,BS+ Rectal:  NOT DONE Neurologic:  Alert and  oriented x4;  grossly normal neurologically. Psych:  Alert and cooperative. Normal mood and affect.   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 OF DIFFUSE ESOPHAGEAL SPASM;WITH PERSISTENT CHOKING EPISODES S/P RECENT EGD WITH BALLOON DILATION AND BOTOX .SXS ARE CONSISTENT WITH SPASM-RELIEVED WITH NTG. SHE SEEMS TO BE INTOLERANT OF LONG ACTING NITRATES. PAIN  PERSISTENT POST LAST DILATION.  WILL SCHEDULE FOR BA SWALLOW /UGI -R/O POST PROCEDURE EDEMA ETC. SCHEDULE FOR REPEAT EGD/BOTOX/DILATION WITH DR. KAPLAN. PT HAS HAD TO HAVE SOME SEQUENTIAL PROCEDURES FOR GOOD RESPONSE IN THE PAST. START NTG S.L. 0.4 as needed CHEST PAIN/CHOKING- I EXPLAINED HOW TO USE,NO MORE THAN ONE AT A TIME,OR TWO IN ONE DAY STOP ISOSORBIDE WHICH WAS NOT TAKING CORRECTLY,AND DOESN'T TOLERATE WELL CONSIDER SURGICAL REFERRAL FOR MYOTOMY. Orders: ZEGD Balloon Dil (ZEGD Balloon)  Problem # 2:  DIABETES MELLITUS-TYPE II (ICD-250.00) Assessment: Comment Only  Problem # 3:  HEPATITIS C, CHRONIC  (ICD-070.54) Assessment: Comment Only  Problem # 4:  BRONCHITIS, CHRONIC (ICD-491.9) Assessment: Comment Only  Other Orders: UGI Series (UGI Series) Barium Swallow (Barium Swallow) ZENDO with Botox (ZENDO/Botox)  Patient Instructions: 1)  Copy sent to : [name], MD 2)  Your radiology test are scheduled at [facility] radiology on 09/06/2009 at 9:45am 3)  Your EGD with Dr Deatra Ina is scheduled on 09/09/2009 at 12:30pm at [place]. 4)  We are sending in your Nitroglycerin to your pharmacy today 5)  Please D/C Isosorbide per Tavionna Grout,PA 6)  The medication list was reviewed and reconciled.  All changed / newly prescribed medications were explained.  A complete medication list was provided to the patient / caregiver. Prescriptions: NITROSTAT 0.4 MG SUBL (NITROGLYCERIN) to use as needed for chest pain and choking  #15 x 1   Entered by:   Genella Mech CMA (AAMA)   Authorized by:   Alfredia Ferguson PA-c   Signed by:   Genella Mech CMA (Bracey) on 09/05/2009   Method used:   Electronically to        [facility] (retail)       [address]       [place], [place]  [address]  Ph: [phone]       Fax: [phone]   RxID:   [id number]

## 2010-03-14 NOTE — Procedures (Signed)
Summary: Prep/[facility]  Prep/[facility]   Imported By: Bubba Hales 09/07/2009 10:05:16  _____________________________________________________________________  External Attachment:    Type:   Image     Comment:   External Document

## 2010-03-14 NOTE — Assessment & Plan Note (Signed)
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 SHE HAS RESPONDED TO BALOON DILATIONS IN COMBINATION WITH BOTOX INJECTIONS. HER LAST PROCEDURE WAS IN DECEMBER 2010.SHE DID HAVE RELIEF BU STARTED HAVING RECURRENT SXS LAST WEEK. SHE HAD A BAD EPISODE OF PAIN LAST TUESDAY AND WENT TO THE ER IN [place],WAS DISCHARGED WITH PAIN MEDS. SHE WENT BACK WITH ANOTHER EPIDOSE WHICH WAS WORSE ON 1/24 AND WAS TRANSFEERRED TO [facility].SHE REPORTS HAVING A CATHERIZATION  WHICH WAS NORMAL. SHE IS STILL HAVING PAIN WHICH WAXES AND WANES BUT DOESN'T GO AWAY-SHE FEELS IT IN TO HER BACK AND SOMETIMES IN TO HER LEFT ARM. SHE CAN EAT,FEEL FOOD STICKING AT TIMES. NO ABDOMINAL PAIN.SHE HAD BEEN TRIED ON NTG IN THE PAST SOMEWHAT HELPFUL. HAD ALSO BEEN ON IMDUR BUT DOESN'T REMEMBER IF THIS HELPED.   GI Review of Systems    Reports chest pain, dysphagia with solids, loss of appetite, and  nausea.      Denies abdominal pain, acid reflux, belching, bloating, dysphagia with liquids, heartburn, vomiting, vomiting blood, and  weight loss.        Denies anal fissure, black tarry stools, change in bowel habit, constipation, diarrhea, diverticulosis, fecal incontinence, heme positive stool, hemorrhoids, irritable bowel syndrome, jaundice, light color stool, liver problems, rectal bleeding, and  rectal pain.    Current Medications (verified): 1)  Glimepiride 4 Mg Tabs (Glimepiride) .... One Tablet Once Daily 2)  Metformin Hcl 500 Mg Tabs (Metformin Hcl) .... One Tablet By Mouth Once Daily 3)  Tramadol Hcl 50 Mg Tabs (Tramadol Hcl) .... Take 1 Tab Every 6 Hours As Needed 4)  Omeprazole 20 Mg  Cpdr (Omeprazole) .... Take 1 Tab 30 Min Prior To Breakfast  Allergies (verified): 1)  ! Codeine  Past History:  Past Medical History: Reviewed history from 03/10/2008 and no changes required. 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 Cholecystectomy Hysterectomy Tonsillectomy  Family History: Reviewed history from 12/20/2008 and no changes required. Family History of Diabetes: Brother, Aunt, and Grandma  No FH of Colon Cancer:  Social History: Reviewed history from 03/15/2008 and no changes required. Patient has never smoked.  Alcohol Use - no Illicit Drug Use - no Occupation: Glass blower/designer  Review of Systems       The patient complains of anxiety-new, arthritis/joint pain, back pain, depression-new, fatigue, headaches-new, itching, muscle pains/cramps, night sweats, nosebleeds, shortness of breath, sleeping problems, and voice change.         ROS OTHERWISE NEGATIVE EXCEPT AS IN HPI  Vital Signs:  Patient profile:   50 year old female Height:      65 inches Weight:      222.13 pounds BMI:     37.10 Pulse rate:   80 / minute Pulse rhythm:   regular BP sitting:   116 / 80  (left arm)  Vitals Entered By: June McMurray Protection [name]) (March 09, 2009 1:23 PM)  Physical Exam  General:  Well developed, well nourished, no acute distress. Head:  Normocephalic  and atraumatic. Eyes:  PERRLA, no icterus. Neck:  Supple; no masses or thyromegaly. Lungs:  Clear throughout to auscultation. Heart:  Regular rate and rhythm; no murmurs, rubs,  or bruits. Abdomen:  SOFT, NONTENDER, NO MASS OR HSM,BS+ Rectal:  NOT DONE Extremities:  No clubbing, cyanosis, edema or deformities noted. Neurologic:  Alert and  oriented x4;  grossly  normal neurologically. Psych:  Alert and cooperative. Normal mood and affect.   Impression & Recommendations:  Problem # 1:  CHEST PAIN (ICD-786.50) Assessment Deteriorated  50 YO FEMALE WITH HX OF ESOPHAGEAL SPASM;PROBABLE DIFFUSE ESOPHAGEAL SPASM WHO HAS RESPONDED TO BALLOON DILATIONS IN COMBINATION WITH BOTOX INJECTIONS-NOW WITH RECURRENT CHEST PAIN. NEGATIVE CATHERIZATION EARLIER THIS WEEK  SCHEDULE FOR REPEAT EGD WITH BALLOON DILATION AND BOTOX INJECTIONS WITH DR. KAPLAN A.S.A.P TRIAL OF PROCARDIA XL 30 MG DAILY IN AM VICODEN 5/500 Q 6 HOURS AS NEEDED FOR PAIN. PT ADVISED TO STOP LISINOPRIL (SHE JUST STARTED IT) DISCUSSED  CONTINUITY OF CARE ISSUES WITH HER AND ADVISED TO TRY TO [place] [facility] SO  HER CARE CAN BE COORDINATED. ALSO BRIEFLY DISCUSSED POSSIBILITY OF MYOTOMY IN THE FUTURE.  Orders: ZEGD Balloon Dil (ZEGD Balloon)  Problem # 2:  HEPATITIS C, CHRONIC (ICD-070.54) Assessment: Comment Only  Patient Instructions: 1)  We scheduled the Endoscopy with Dr [name] [name] and [facility] on 03-17-09 at [facility].   2)  Endoscopy brochure provided. 3)  We sent a perscription for Procardia XL to your pharmacy. 4)  We have given you a perscription for Vicodin to take to your pharmacy. 5)  Stop the Lisinopril.  6)  Copy sent to : Dr. [name] 7)  The medication list was reviewed and reconciled.  All changed / newly prescribed medications were explained.  A complete medication list was provided to the patient / caregiver. Prescriptions: PROCARDIA XL 30 MG XR24H-TAB (NIFEDIPINE) Take 1 tab daily  #30 x 2   Entered by:   Marisue Humble NCMA   Authorized by:   Alfredia Ferguson PA-c   Signed by:   Marisue Humble NCMA on 03/09/2009   Method used:   Electronically to        [facility]  [address] [address] [phone]* (retail)       [address], [place]  [address]       Ph: [phone] or [phone]       Fax: [phone]   RxID:    [id number] VICODIN 5-500 MG TABS (HYDROCODONE-ACETAMINOPHEN) Take 1 tab every 6 hours as needed for pain  #40 x 0   Entered by:   Marisue Humble NCMA   Authorized by:   Alfredia Ferguson PA-c   Signed by:   Marisue Humble NCMA on 03/09/2009   Method used:   Printed then faxed to ...       [facility]  [address] [phone]* (retail)       [address]       [place], [place]  [address]       Ph: [phone] or [phone]       Fax: [phone]   RxID:   [id number]

## 2010-03-14 NOTE — Progress Notes (Signed)
Summary: Triage-Chest Pain  Phone Note Call from Patient Call back at [phone]   Caller: Patient Call For: Dr. Deatra Ina Reason for Call: Talk to Nurse Summary of Call: Pt. is having pain in chest for over a week. Initial call taken by: Webb Laws,  March 22, 2009 11:40 AM  Follow-up for Phone Call        Had Endo/Dil/Botox on 03-17-09.Pt. continues with chest pain. Takes Omeprazole 20mg  daily, Vicodin 5/500 Q6 hours. Never started Procardia that was ordered on 03-09-09.  1) Start Procardia daily 2) Soft,bland diet. No spicy,greasy,fried foods.  3) Continue other above meds 4) Call with an update on Friday, sooner as needed. Follow-up by: Vivia Ewing LPN,  February  8, 624THL 12:09 PM  Additional Follow-up for Phone Call Additional follow up Details #1::        ok Additional Follow-up by: Inda Castle MD,  March 22, 2009 1:53 PM

## 2010-03-14 NOTE — Progress Notes (Signed)
Summary: referral info  Phone Note Call from Patient Call back at [phone]   Caller: Patient Call For: Dr. Deatra Ina Reason for Call: Talk to Nurse Summary of Call: would like name of doctor being referred to at [facility] as well as the phone number Initial call taken by: Lucien Mons,  November 16, 2009 10:01 AM  Follow-up for Phone Call        Dr Derrill Kay [facility] [phone].  I have left the patient a voicemail with the information Follow-up by: Barb Merino RN, CGRN,  November 16, 2009 10:39 AM

## 2010-03-14 NOTE — Progress Notes (Signed)
Summary: C/O CHEST PAIN AFTER PROCEDURE  Phone Note Call from Patient   Caller: Spouse Details for Reason: pain Summary of Call: Patient c/o pain post procedure (talked w/ female). Current and  old record reviewed. Pt has c/o same in past...Marland Kitchenhear patient SCREAMING in he background. Impossible for me to know if there is a clinicly relevant problem. Thus, could only recommend goimng to ER for eval. Initial call taken by: Irene Shipper MD,  September 09, 2009 8:08 PM

## 2010-03-14 NOTE — Progress Notes (Signed)
Summary: TRIAGE-PAIN  Phone Note Call from Patient Call back at [phone]   Caller: Patient Call For: Deatra Ina Reason for Call: Talk to Nurse Summary of Call: Patient has chest pain, had procedure yesterday (pt crying) Initial call taken by: Ronalee Red,  March 18, 2009 3:41 PM  Follow-up for Phone Call        Pt. had Endo/Botox/DIl. yesterday. Pt. is crying, states, "I hurt like hell!" Pt. states she hasn't eaten or drank anything since 12noon yesterday. Pt. states this is the same pain she usually has.  Per Dr.Natania Finigan- 1) Full liquids x24 hours. Advance to soft,bland diet x2-4 days. Advanced as tolerated. 2) Vicodin 5-500 1 Q6H as needed for pain, #30, 0 refills  Above MD orders reviewed with patient. Med to pharmacy. If symptoms become worse call back immediately or go to ER.   Follow-up by: Vivia Ewing LPN,  February  4, 624THL 3:46 PM    Prescriptions: VICODIN 5-500 MG TABS (HYDROCODONE-ACETAMINOPHEN) Take 1 tab every 6 hours as needed for pain  #30 x 0   Entered by:   Vivia Ewing LPN   Authorized by:   Inda Castle MD   Signed by:   Vivia Ewing LPN on [id number]   Method used:   Telephoned to ...       [facility]  [address] [phone]* (retail)       [address]       [place], [place]  [address]       Ph: [phone] or [phone]       Fax: [phone]   RxID:   [id number]

## 2010-03-14 NOTE — Progress Notes (Signed)
Summary: Triage-Pain  Phone Note Call from Patient Call back at [phone]   Caller: Patient Call For: Dr. Deatra Ina Reason for Call: Talk to Nurse Summary of Call: pt. had Endo yesterday and is experiencing chest pain and discomfort Initial call taken by: Webb Laws,  August 24, 2009 12:51 PM  Follow-up for Phone Call        # busy x3, I will try back later. Vivia Ewing LPN  July 13, 624THL [id number] PM   No answer, I will try back later. Vivia Ewing LPN  July 13, 624THL 624THL PM  No answer, I will call again tomorrow. Vivia Ewing LPN  July 13, 624THL 624THL PM   Pt's mother states to call pt. at [phone]. Unable to leave a message at that #. I called back and left a message with pt./mother for pt. to callback. Follow-up by: Vivia Ewing LPN,  July 14, 624THL 624THL AM  Additional Follow-up for Phone Call Additional follow up Details #1::        Had Endo/Balloon Dil/Botox 07-24-09. She began w/chest pain that evening. Pain is intermittent, sharp, mid-chest and left chest. Some nausea and painful swallowing. Takes Omeprazole daily.  Pt. will see Tye Savoy NP today at 2pm. Additional Follow-up by: Vivia Ewing LPN,  July 14, 624THL 624THL AM

## 2010-03-14 NOTE — Procedures (Signed)
Summary: Prep/[facility]  Prep/[facility]   Imported By: Bubba Hales 07/28/2009 10:52:05  _____________________________________________________________________  External Attachment:    Type:   Image     Comment:   External Document

## 2010-03-14 NOTE — Letter (Signed)
Summary: Diabetic Instructions  [facility]  [place], [place] [address]   Phone: [phone]  Fax: [phone]    [name] [name] 1960-07-22 MRN: [id number]   X    ORAL DIABETIC MEDICATION INSTRUCTIONS  The day before your procedure:   Take your diabetic pill as you do normally  The day of your procedure:   Do not take your diabetic pill    We will check your blood sugar levels during the admission process and again in Recovery before discharging you home  ________________________________________________________________________  _  _   INSULIN (LONG ACTING) MEDICATION INSTRUCTIONS (Lantus, NPH, 70/30, Humulin, Novolin-N)   The day before your procedure:   Take  your regular evening dose    The day of your procedure:   Do not take your morning dose    _  _   INSULIN (SHORT ACTING) MEDICATION INSTRUCTIONS (Regular, Humulog, Novolog)   The day before your procedure:   Do not take your evening dose   The day of your procedure:   Do not take your morning dose   _  _   INSULIN PUMP MEDICATION INSTRUCTIONS  We will contact the physician managing your diabetic care for written dosage instructions for the day before your procedure and the day of your procedure.  Once we have received the instructions, we will contact you.

## 2010-03-14 NOTE — Letter (Signed)
Summary: EGD Instructions  [facility]  [place], [place] [address]   Phone: [phone]  Fax: [phone]       [name] [name]    27-Jun-1960    MRN: [id number]       Procedure Day /Date:TUESDAY 08/23/2009     Arrival Time: 8AM     Procedure Time:9AM     Location of Procedure:                     [facility] ( Outpatient Registration)    PREPARATION FOR ENDOSCOPY/BALLOON/BOTOX   On7/01/2010  THE DAY OF THE PROCEDURE:  1.   No solid foods, milk or milk products are allowed after midnight the night before your procedure.  2.   Do not drink anything colored red or purple.  Avoid juices with pulp.  No orange juice.  3.  You may drink clear liquids until5AM, which is 4 hours before your procedure.                                                                                                CLEAR LIQUIDS INCLUDE: Water Jello Ice Popsicles Tea (sugar ok, no milk/cream) Powdered fruit flavored drinks Coffee (sugar ok, no milk/cream) Gatorade Juice: apple, white grape, white cranberry  Lemonade Clear bullion, consomm, broth Carbonated beverages (any kind) Strained chicken noodle soup Hard Candy   MEDICATION INSTRUCTIONS  Unless otherwise instructed, you should take regular prescription medications with a small sip of water as early as possible the morning of your procedure.  Diabetic patients - see separate instructions.             OTHER INSTRUCTIONS  You will need a responsible adult at least 49 years of age to accompany you and drive you home.   This person must remain in the waiting room during your procedure.  Wear loose fitting clothing that is easily removed.  Leave jewelry and other valuables at home.  However, you may wish to bring a book to read or an iPod/MP3 player to listen to music as you wait for your procedure to start.  Remove all body piercing jewelry and leave at home.  Total time from sign-in until  discharge is approximately 2-3 hours.  You should go home directly after your procedure and rest.  You can resume normal activities the day after your procedure.  The day of your procedure you should not:   Drive   Make legal decisions   Operate machinery   Drink alcohol   Return to work  You will receive specific instructions about eating, activities and medications before you leave.    The above instructions have been reviewed and explained to me by   _______________________    I fully understand and can verbalize these instructions _____________________________ Date _________

## 2010-03-14 NOTE — Progress Notes (Signed)
Summary: TRIAGE  Phone Note Call from Patient Call back at [phone]   Caller: Patient Call For: Dr. Deatra Ina Reason for Call: Talk to Nurse Summary of Call: Pt has been in the hosp. for chest pain to ruleout heart. Needs an appt. w/[facility] before the first available which is Feb. 16th. Initial call taken by: Webb Laws,  March 08, 2009 3:54 PM  Follow-up for Phone Call        Pt. was in the hospital for chest pain, states cardiac has been r/o. Continues w/severe chest pain, wants an ASAP appt.  She will see [facility] on 03-09-09 at 1:30pm. Pt. advised to bring the hospital records with her, she agrees. If symptoms become worse call back immediately or go to ER.  Follow-up by: Vivia Ewing LPN,  January 25, 624THL 4:06 PM

## 2010-03-14 NOTE — Progress Notes (Signed)
Summary: TRIAGE  Phone Note Call from Patient Call back at Work Phone [phone]   Call For: Dr Deatra Ina Summary of Call: Told me she was returning Robin's call but I did not see a note stating that. When I asked her when did Robin call and she started crying and said she is tired of hurting and just wants help. Initial call taken by: Irwin Brakeman [facility],  September 16, 2009 1:40 PM  Follow-up for Phone Call        Pt. crying, continues w/epigastric pain.  Per Dr.Kaplan- 1) Vicodin 5-500 1 by mouth Q6 hours PRN #30, 0 refill. 2) Full liquids x24 hours. Advance to soft,bland diet x2-4 days. Advanced as tolerated. 3) Keep appt. w/Dr.Koch at [facility]. 4) If symptoms become worse call back immediately or go to ER.     Follow-up by: Vivia Ewing LPN,  August  5, 624THL 2:13 PM

## 2010-03-14 NOTE — Progress Notes (Signed)
Summary: TRIAGE--PAIN  Phone Note Call from Patient Call back at [phone]   Caller: Patient Call For: Dr. Deatra Ina Reason for Call: Talk to Nurse Summary of Call: Pt is having chest pain since last Wednesday. Was hoping it would go away Initial call taken by: Webb Laws,  February 14, 2009 9:37 AM  Follow-up for Phone Call        Pt. had Endo/Botox & Balloon Dil. on 02-08-09. She is c/o severe chest pain, pain is constant. Also states her voice is "Restricted"  States she feels like her food is moving around in her chest. Pt. is on the phone crying.   [facility] PLEASE ADVISE   Follow-up by: Vivia Ewing LPN,  January  3, 624THL 9:51 AM  Additional Follow-up for Phone Call Additional follow up Details #1::        needs barium swallow vicodin 10/500 1 tab Q6h as needed #25 Additional Follow-up by: Inda Castle MD,  February 14, 2009 10:04 AM    Additional Follow-up for Phone Call Additional follow up Details #2::    Above MD orders reviewed with patient. She is scheduled for a Barium Swallow with Gastrografin, at [facility] today at 1pm. NPO 3 hours prior. She will pick-up her prescription after her x-ray. Pt. to remain on liquids only until Dr.Krisi Azua reviews BA Swallow and advises. If symptoms become worse call back immediately or go to ER.  Follow-up by: Vivia Ewing LPN,  January  3, 624THL 10:17 AM  New/Updated Medications: HYDROCODONE-ACETAMINOPHEN 5-500 MG TABS (HYDROCODONE-ACETAMINOPHEN) Take one by mouth every 6 hours as needed for pain. Prescriptions: HYDROCODONE-ACETAMINOPHEN 5-500 MG TABS (HYDROCODONE-ACETAMINOPHEN) Take one by mouth every 6 hours as needed for pain.  #25 x 0   Entered by:   Vivia Ewing LPN   Authorized by:   Inda Castle MD   Signed by:   Vivia Ewing LPN on [id number]   Method used:   Print then Give to Patient   RxID:   [id number]

## 2010-03-14 NOTE — Procedures (Signed)
Summary: Instructions for procedure/[facility] (out pt)  Instructions for procedure/[facility] (out pt)   Imported By: Phillis Knack 03/12/2009 11:47:06  _____________________________________________________________________  External Attachment:    Type:   Image     Comment:   External Document

## 2010-04-29 LAB — CBC
HCT: 33.9 % — ABNORMAL LOW (ref 36.0–46.0)
Hemoglobin: 11.7 g/dL — ABNORMAL LOW (ref 12.0–15.0)
MCV: 84.3 fL (ref 78.0–100.0)
RBC: 4.02 MIL/uL (ref 3.87–5.11)
RDW: 14 % (ref 11.5–15.5)
WBC: 11.3 10*3/uL — ABNORMAL HIGH (ref 4.0–10.5)

## 2010-04-29 LAB — HEPATIC FUNCTION PANEL
ALT: 23 U/L (ref 0–35)
AST: 28 U/L (ref 0–37)
Alkaline Phosphatase: 129 U/L — ABNORMAL HIGH (ref 39–117)
Bilirubin, Direct: 0.1 mg/dL (ref 0.0–0.3)
Indirect Bilirubin: 0.7 mg/dL (ref 0.3–0.9)

## 2010-04-29 LAB — POCT CARDIAC MARKERS
CKMB, poc: <1 ng/mL — ABNORMAL LOW (ref 1.0–8.0)
Myoglobin, poc: 51.6 ng/mL (ref 12–200)
Myoglobin, poc: 62.7 ng/mL (ref 12–200)

## 2010-04-29 LAB — DIFFERENTIAL
Eosinophils Relative: 5 % (ref 0–5)
Lymphocytes Relative: 33 % (ref 12–46)
Lymphs Abs: 3.8 10*3/uL (ref 0.7–4.0)
Monocytes Absolute: 0.7 10*3/uL (ref 0.1–1.0)
Monocytes Relative: 6 % (ref 3–12)
Neutro Abs: 6.3 10*3/uL (ref 1.7–7.7)

## 2010-04-29 LAB — BASIC METABOLIC PANEL
Calcium: 8.9 mg/dL (ref 8.4–10.5)
GFR calc Af Amer: >60 mL/min (ref >60)
Glucose, Bld: 72 mg/dL (ref 70–99)

## 2010-04-29 LAB — GLUCOSE, CAPILLARY
Glucose-Capillary: 164 mg/dL — ABNORMAL HIGH (ref 70–99)
Glucose-Capillary: 80 mg/dL (ref 70–99)

## 2010-05-03 LAB — GLUCOSE, CAPILLARY: Glucose-Capillary: 264 mg/dL — ABNORMAL HIGH (ref 70–99)

## 2010-05-15 LAB — GLUCOSE, CAPILLARY: Glucose-Capillary: 146 mg/dL — ABNORMAL HIGH (ref 70–99)

## 2010-06-21 ENCOUNTER — Other Ambulatory Visit: Payer: Self-pay | Admitting: Physician Assistant

## 2010-06-26 NOTE — Telephone Encounter (Signed)
Amy saw patient 02/2010 and refilled the Nitrostat for her.  She is a Dr. Deatra Ina patient and Amy said to send it to you or Vaughan Basta and he can decide if he wants to fill it.

## 2010-06-27 NOTE — Telephone Encounter (Signed)
DR Deatra Ina DO YOU WANT REFILL THIS MEDICATION AMY WAS FIRST PRESCRIBER

## 2010-06-27 NOTE — Assessment & Plan Note (Signed)
[place] OFFICE NOTE   NAME:[name], [name] GOOS                        MRN:          [id number]  DATE:04/23/2008                            DOB:          [birth year]    IDENTIFICATION:  [name] [name] is a 50 year old who was referred for  evaluation of chest pain.  She has followed in our GI Clinic.  She also  has been followed by Dr. [name].   HISTORY OF PRESENT ILLNESS:  The patient has a history of esophageal  spasm, has had a Botox injection in the past.  Most recently a few weeks  ago by Dr. Deatra Ina (she has an appointment next week).   She has a history of chest pain in the past.  In May of last year, she  had a cardiac catheterization done at [facility],  which showed normal coronary artery, LVEF of 70.  At that time, she said  she had chest pressure.   Over the past couple of weeks, she has had episodes of chest pain, worse  at night sharp, stabbing pain that radiated to left arm, worse with  taking a deep breath with these spells, she also says that night she  will some right leg cramping.   Note at work, she does lot of lifting, actually up to 70 pounds.  She  denies injury that she can remember.   CURRENT MEDICINES:  1. Glyburide 4 b.i.d.  2. Imdur 30.   ALLERGIES:  CODEINE leading to itching.   PAST MEDICAL HISTORY:  1. Diabetes.  2. Hepatitis C.  3. History of reflux and esophageal spasm.   SOCIAL HISTORY:  The patient is married, does not smoke, does not drink.   FAMILY HISTORY:  Negative for premature CAD.   REVIEW OF SYSTEMS:  All systems reviewed, negative to the above problem  except as noted above.   PHYSICAL EXAMINATION:  GENERAL:  The patient is in no acute distress at  rest.  VITAL SIGNS:  Blood pressure 108/72, pulse is 78 and regular, weight  246, and height is 5 feet 5 inches.  HEENT:  Normocephalic and atraumatic.  EOMI, PERRLA.  Mucous membranes  are  moist.  NECK:  JVP is normal without thyromegaly or bruits.  LUNGS:  Clear.  No rales or wheezes.  Moving air well.  CARDIAC:  Regular rate and rhythm.  S1 and S2.  No S3.  No murmurs.  CHEST:  Tender on palpation.  No with deep inspiration.  The patient's  pain recurs in the left parasternal area.  ABDOMEN:  Supple and nontender.  Normal bowel sounds.  No hepatomegaly.  EXTREMITIES:  Good distal pulses throughout.  No lower extremity edema.   A 12-lead EKG, normal sinus rhythm at 78 beats per minute.  Nonspecific  ST changes.   IMPRESSION:  Chest pain.  I am not impressed that [name] [name]'s chest  pain is cardiac in nature.  Indeed, they brought about mildly with  palpation, but more I watched her catch and develop pain when she takes  a deep breath.  I think it is more musculoskeletal.  She does have a  history of some heavy lifting and I think this is exacerbating things.   She may have some GI component as well.  She will discuss with Dr.  Deatra Ina.   I would not plan any further testing unless her symptoms change.  I told  her try to cut back on her work and give her time to rest.   I have not set a definite followup.      Fay Records, MD, [facility]  Electronically Signed    PVR/MedQ  DD: 04/23/2008  DT: 04/24/2008  Job #: [id number]   cc:   [name]

## 2010-06-27 NOTE — Letter (Signed)
September 07, 2009    Delton Coombes, MD  [facility] [place], [place]  [address]   RE:  BLAKELYN, FAUSNAUGH  MRN:  [id number]  /  DOB:  1960/09/13   Dear Yvone Neu,   I am referring Ms. [name] for your evaluation.  She is a 50-year-  old [place] American female with a history of esophageal spasm.  She has  been treated for the past 10 years for severe episodes of chest pain.  She has had multiple cardiac workups which were negative.  Esophageal  manometry 10 years ago demonstrated high-amplitude contractions after  stimulation with Tensilon that were accompanied by chest pain.  She has  been treated with calcium-blocking agents and nitrates without success.  We have had limited success with periodic Botox injections of her distal  esophagus at 1 cm intervals.  At times, she has responded to combination  of balloon dilatation and Botox injection.  Unfortunately, she is  becoming increasingly symptomatic despite a medical therapy.  Before  proceeding with surgical myotomy, I would like your thoughts and  recommendations.    Sincerely,      Sandy Salaam. Deatra Ina, MD,FACG    RDK/MedQ  DD: 09/07/2009  DT: 09/08/2009  Job #: [id number]

## 2010-06-30 NOTE — Procedures (Signed)
[place]  Patient:    [name] [name], [name] [name]                        MRN: [id number] Proc. Date: 07/28/99 Adm. Date:  [id number] Disc. Date: [id number] Attending:  Fay Records CC:         Dorris Carnes, M.D. [facility]                           Procedure Report  PROCEDURE:  Upper endoscopy.  ENDOSCOPIST:  Sandy Salaam. Deatra Ina, M.D.  INDICATIONS:  The patient is a 50 year old black female admitted with what turned out to be noncardiac chest pain.  Cardiac cath was negative.  Test is performed to rule out upper GI pathology.  INFORMED CONSENT:  The patient provided consent after risks, benefits, and alternatives were explained.  MEDICATIONS:  Versed 7.5, fentanyl 75, and Robinul 0.2 mg IV, and Cetacaine spray.  DESCRIPTION OF PROCEDURE:  The patient was placed in the left lateral decubitus position and administered continuous low flow oxygen and was placed on pulse oximetry.  The Olympus video gastroscope was inserted under direct vision into the oropharynx and esophagus.  FINDINGS: 1. In the prepyloric antrum, there was a superficial 1 cm erosion.  The    surrounding mucosa was slightly erythematous.  Biopsies were taken to rule    out H. pylori. 2. Normal esophagus, proximal stomach and duodenum.  IMPRESSION: 1. Gastric erosion. 2. Chest pain of unclear etiology.  RECOMMENDATIONS: 1. Continue Protonix 40 mg a day. 2. Esophageal manometry with provocative testing with _________. DD:  07/28/99 TD:  08/01/99 Job: [id number]

## 2010-06-30 NOTE — Op Note (Signed)
NAME:  [name] [name], [name] [name]                           ACCOUNT NO.:  [id number]   MEDICAL RECORD NO.:  [id number]                   PATIENT TYPE:  AMB   LOCATION:  DAY                                  FACILITY[facility]  [facility]   PHYSICIAN:  Ralene Bathe. Matthew Saras, M.D.            DATE OF BIRTH:  [birth year]   DATE OF PROCEDURE:  12/04/2001  DATE OF DISCHARGE:                                 OPERATIVE REPORT   PREOPERATIVE DIAGNOSES:  1. Ovarian cyst.  2. Acute and chronic pelvic pain.   POSTOPERATIVE DIAGNOSES:  1. Ovarian cyst.  2. Acute and chronic pelvic pain.  3. Possible products of conception.   PROCEDURE:  Diagnostic laparoscopy, laparoscopic bilateral salpingo-  oophorectomy.   SURGEON:  Ralene Bathe. Matthew Saras, M.D.   ANESTHESIA:  General endotracheal.   COMPLICATIONS:  None.   DRAINS:  Foley catheter.   ESTIMATED BLOOD LOSS:  50 cc.   SPECIMENS:  Specimens removed both tubes and ovaries.   DESCRIPTION OF PROCEDURE:  The patient was taken to the operating room.  After an adequate level of general endotracheal anesthesia was obtained,  with the patient's legs in stirrups, the abdomen, perineum and vagina were  prepped and draped in the usual manner for a laparoscopy.  The bladder was  drained and examination was carried out.  No masses were noted.  The  subumbilical area was infiltrated with 0.5% Marcaine plain.  The Veress  needle was inserted without difficulty.  The central abdominal position was  verified by pressure and water testing.  After a two liter pneumoperitoneum  was then created, laparoscope trocar was inserted and introduced without  difficulty.  This is a 10-12 trocar.  There was no evidence of any bleeding  or trauma.  Three fingerbreadths below the symphysis in the midline, a 5 mm  trocar was inserted after infiltrating with 0.5% Marcaine.  This was done  under direct visualization.  A 35 mm trocar was inserted in the left lower  quadrant after negative  transillumination.  The patient was placed in  Trendelenburg and the pelvic findings revealed that both ovaries were twice  normal size but free and mobile.  There was no evidence of any free fluid or  other abnormalities noted.  Due to the severity of her problem her request  was to have both ovaries removed since she has had severe pain related to  her current ovarian cyst.  Atraumatic grasper was used to grasp the right  ovary.  It was placed on traction toward the midline.  The course of the  right ureter was noted to be well below the operative site.  Coagulation and  cut technique was used to coagulate and cut the ligament again assuring that  the course of the ureter was well below removing the right tube and ovary.  This was hemostatic.  The exact same was repeated on the opposite side again  after  carefully identifying the ureter below.  Initially we tried to remove  the ovary with an Endobag, but due to the size and the density of the ovary,  this was not possibly.  Each ovary was cut into small pieces to remove  through the 12 mm trocar.  All pieces were accounted for.  The pelvis was  irrigated with saline and aspirated.  Pedicles were noted to be hemostatic.  She tolerated this well.  The instruments were removed.  Gas was allowed to  escape.  Deep layers were closed with 4-0 Dexon subcuticular sutures and  Dermabond.  The upper incision due to the adipose layer could never  adequately the fascia to close separately.  This was closed with 2-0 subcu  interrupted sutures and 4-0 Dexon subcuticular with Dermabond and a pressure  dressing.  She tolerated this well and went to the recovery room in good  condition.                                               Richard M. Matthew Saras, M.D.    RMH/MEDQ  D:  12/04/2001  T:  12/04/2001  Job:  [id number]

## 2010-06-30 NOTE — H&P (Signed)
   NAME:  [name] [name], [name] [name]                           ACCOUNT NO.:  [id number]   MEDICAL RECORD NO.:  [name]                   PATIENT TYPE:  AMB   LOCATION:  DAY                                  FACILITY:  [facility]   PHYSICIAN:  Ralene Bathe. Matthew Saras, M.D.            DATE OF BIRTH:  18-Nov-1960   DATE OF ADMISSION:  12/04/2001  DATE OF DISCHARGE:                                HISTORY & PHYSICAL   CHIEF COMPLAINT:  Acute and chronic pelvic pain.   HISTORY AND PHYSICAL:  A 50 year old G3 P3, prior hysterectomy.  This  patient was originally seen in my office on October 28, 2001 with a two-  week history of severe right lower quadrant pain.  She was seen several ED  visits and also by her local GYN who told her she had an ovarian cyst.   When she continued to have pain without relief, a CT was done at the [facility] ED  that showed the uterus to be surgically absent, the right ovary 6 cm in  diameter, with two low-density areas representing smaller cysts  She has  continued to require Percocet for pain and presents now for DL, possible  BSO.  This procedure including risks of bleeding, infection, transfusion,  the possible need for open or additional surgery, the need for ERT, and  other risks regarding wound infection, phlebitis, etc. reviewed with her.  Her CA 125 was drawn on October 15 which was 10.3.  Pain has been bothering  to the point that she has a strong preference to proceed with BSO.   ALLERGIES:  None.   PAST MEDICAL HISTORY:  She is on an oral hypoglycemic for diet-controlled  diabetes.  Surgeries:  She has had prior tonsillectomy, and hysterectomy in  1999.  She is a G3 P3.   FAMILY HISTORY:  Otherwise unremarkable.   PHYSICAL EXAMINATION:  VITAL SIGNS:  Temperature 98.2, blood pressure  124/74.  HEENT:  Unremarkable.  NECK:  Supple without masses.  LUNGS:  Clear.  CARDIOVASCULAR:  Regular rate and rhythm without murmurs, rubs, or gallops  noted.  BREASTS:  Without  masses.  ABDOMEN:  Soft, flat, nontender.  PELVIC:  Normal external genitalia, vagina cuff clear, bimanual negative.  Some tenderness on exam but no definite mass.  EXTREMITIES AND NEUROLOGIC:  Unremarkable.    IMPRESSION:  1. Ovarian cyst.  2. Severe pelvic pain.   PLAN:  Diagnostic laparoscopy, possible BSO.  Procedure and risks reviewed  as above.                                                Richard M. Matthew Saras, M.D.    RMH/MEDQ  D:  12/02/2001  T:  12/02/2001  Job:  [id number]

## 2010-06-30 NOTE — Cardiovascular Report (Signed)
[place]  Patient:    [name] [name], [name] [name]                        MRN: [id number] Proc. Date: 07/27/99 Adm. Date:  [id number] Disc. Date: [id number] Attending:  Fay Records CC:         Allene Dillon, M.D. [facility]             Dr. Lee, [facility]                        Cardiac Catheterization  PROCEDURE PERFORMED:  Coronary arteriography.  INDICATIONS:  Recurrent chest pain.  It was somewhat difficult to access the patients right femoral artery due to her large size.  The 6 French sheath and catheters were used.  CORONARY ARTERIOGRAPHY:  Left main coronary artery:  The left main coronary artery is normal.  Left anterior descending artery:  The left anterior descending artery was normal.  Circumflex coronary artery:  The circumflex coronary artery was nondominant and it was normal.  Right coronary artery:  The right coronary artery was dominant.  It was normal.  Because of the patients persistent chest pain aortic root injection was performed.  There was no evidence of dissection or atheroma.  VENTRICULOGRAPHY:  RAO ventriculography revealed normal wall motion with an EF in excess of 65%.  Left ventricular pressure was equal to 168/27.  Aortic pressure was 159/85.  Because of the patients significant hypertension during the case, distal aortography was performed and there was no evidence of renal artery stenosis.  IMPRESSION:  The patient has had atypical chest pain with normal Cardiolite study and normal catheterization.  She will have a gastrointestinal work-up next.  The patient may require additional medication for her high blood pressure.  She tolerated the procedure well with Versed sedation. DD:  07/27/99 TD:  07/31/99 Job: [id number]

## 2010-06-30 NOTE — Procedures (Signed)
[name] [name]  Patient:    [name] [name], [name] [name]                        MRN: [id number] Proc. Date: 08/24/99 Adm. Date:  [id number] Disc. Date: [id number] Attending:  [place] [facility]                           Procedure Report  PROCEDURE:  Esophageal manometry.  HISTORY:  [name] is a 50 year old female with atypical chest pain. Cardiac work-up is negative.  Testing is performed, including provocative testing with ______ to rule out esophageal spasm.  Esophageal manometry was performed in the usual pull through fashion.  FINDINGS: 1. Upper esophageal sphincter contraction pressure contractions and relaxation    were normal. 2. There were peristaltic contractions throughout the esophageal body.  In the    distal esophagus, there was one high-amplitude contraction after saline    injection measuring 254 mmHg.  Following injection of IV Tensilon.  There    were sustained high-amplitude contractions in the middle and distal    esophagus.  This was associated with chest pain. 3. The LES resting pressure was elevated at 65.8 mmHg.  Relaxation was normal.  IMPRESSION:  Positive Tensilon provocative test consistent with diffuse esophageal spasm.  RECOMMENDATIONS:  Procardia XL 30 mg daily. DD:  08/24/99 TD:  08/24/99 Job: [id number]

## 2010-06-30 NOTE — Op Note (Signed)
[place]. [name]  Patient:    [name] [name]                         MRN: [id number] Proc. Date: 01/23/99 Adm. Date:  [id number] Attending:  Shaune Pollack                           Operative Report  HISTORY OF PRESENT ILLNESS:  I was asked to see [name] [name] in the emergency  room at [facility] January 23, 1999.  She is a 50 year old right-hand  dominant black female, who fell off her porch, sustained an injury to her right  thumb.  The patient has an open distal phalanx fracture with nail bed laceration and avulsed nail.  I have been asked to see her in regards to this.  The patient was given a g of Ancef in the emergency room today.  The patient is up to date n her tetanus shot (had a tetanus shot in the last six months).  She denies loss f consciousness or other complaints.  She is accompanied with her husband.  ALLERGIES:  Darnelle Maffucci is difficult for her to take.  MEDICINES:  None.  PAST SURGICAL HISTORY:  Hysterectomy, tonsillectomy and adenoidectomy.  PAST MEDICAL HISTORY:  None.  SOCIAL HISTORY:  She does not smoke or drink.  She has two children.  She is a Ship broker.  She is married.  PHYSICAL EXAMINATION:  This is a very pleasant black female, alert and oriented and in no acute distress.  Patient has a right thumb that has a nail bed avulsion and bleeding blood in this area.  EPL and FPL appear to be intact.  Sensation ______ is intact.  She is very tender.  The IP joint was not stressed.  There is normal refill.  The patient did not have any pain at the MCP joint.  CMC joint and fingers throughout the right hand are nontender.  Her HEENT examination is grossly within normal limits.  Her chest has equal breath sounds.  Abdomen is obese and soft without distension.  Bilateral lower extremities are atraumatic.  She is ambulator. X-ray shows a comminuted distal phalanx fracture, which is not significantly displaced and  does not extend into the interarticular surface.  There is some depression of the dorsal cortical fragment.  IMPRESSION:  Open distal phalanx fracture, right thumb with nail bed laceration.  PLAN:  I verbally consented her for I&D and repair of structures as necessary.  With this in mind, she desires to proceed.  DESCRIPTION OF OPERATION/PROCEDURE:  The patient was taken to the procedure area of the ER.  She underwent a local block with Marcaine and lidocaine without epinephrine.  The Marcaine was 0.25% without epinephrine.  The lidocaine was 1%  without epinephrine.  Once a satisfactory block was achieved about the thumb, she was given a 10 minute surgical Betadine scrub followed by sterile Betadine painting and draping.  Once the sterile field was secured, the patient had the nail removed distally.  This was done by taking a Soil scientist between the nail and the sterile matrix.  The nail was then removed to a different portion of the operative area and soaked in irrigant.  Next, the patient had inspection of the nail bed laceration.  This was done under tourniquet control using a Penrose tourniquet.  The patient had an obvious laceration, which was oblique  and somewhat jagged ulnarly.  This did involve the germinal matrix.  Back cuts were made at the eponychial folds and the eponychial folds were then sutured down to the skin so as to see clearly the germinal and sterile matrix.  Once this was done, an I&D was  accomplished.  The dorsal cortex, which was comminuted was I&Dd copiously with syringe, saline and pulsatile-type lavage using a 25-gauge needle.  Once satisfactory I&D was accomplished, the dorsal comminution was replaced to a flat even surface and the patient then underwent nail bed repair with 7-0 Vicryl under 4.0 loupe magnification.  The nail bed approximated well.  The stellate configuration ulnarly did approximate well with the repair.  I was pleased  with the repair.  It was a nice smooth surface.  I discussed with the patient that she does have propensity towards nail ridging and/or decreased growth secondary to the involvement in the germinal matrix.  Once this patient had the repair performed, the tourniquet was then deflated.  Following this, the patient had the eponychial fold stitch removed.  The patients nail was then trimmed and scraped to a translucent and very thin surface and treated with antibiotic irrigant.  A large hole was placed in the middle of it and it was then replaced and sutured with three interrupted nylon sutures.  The eponychial fold back cuts, which were created with knife blade were sutured with 5-0 chromic suture.  This all approximated well.  The patient had excellent capillary refill.  Bloody egress of fluid was able to be accomplished through the dorsal hole in the nail.  Following this, the patient ad Xeroform placed around the incision and laceration sites.  The patient then had  placement of gauze, followed by Doreene Nest and a Coban wrap with a volar splint. She tolerated the procedure well, had good refill and there were no complications. All sponge, needle and instrument counts were correct.  The patient was instructed on elevation x 72 hours.  She will return to see me n one week for wound check.  She will be given Keflex x seven days 5 mg one p.o. q.i.d.  She was given appropriate pain medicine and precautions while taking it. If there are any problems, she will notify me. DD:  01/23/99 TD:  01/23/99 Job: [id number]

## 2010-06-30 NOTE — Discharge Summary (Signed)
[place]. [facility]  Patient:    [name] [name], [name] [name]                        MRN: [id number] Adm. Date:  [id number] Disc. Date: 07/28/99 Attending:  Fay Records Dictator:   Mannie Stabile, P.A. CC:         Sandy Salaam. Deatra Ina, M.D. [facility]             Dr. Truman Hayward, [place]                  Referring Physician Discharge [place]  PROCEDURES: 1. Cardiac catheterization on July 27, 1999. 2. Esophagogastroduodenoscopy on July 28, 1999.  HISTORY OF PRESENT ILLNESS:  The patient is a 50 year old female without a prior history of heart disease and cardiac risk factors notable for diabetes mellitus, who initially presented to [facility] with chest discomfort. Serial cardiac enzymes were negative and a Persantine Cardiolite was normal. However, the patient requested coronary angiography for definitive diagnosis and was subsequently transferred to [facility].  LABORATORY DATA AT [place]:  Negative serial CPK-MB. Troponin I 0.0.  Hemoglobin A1C 9.7.  Normal CBC.  Normal metabolic profile save for elevated glucose of 128.  Elevated SGOT/SGPT.  Lipid profile: Cholesterol 176, triglycerides 247, HDL 38, LDL 89.  Hepatic panel:  Total protein 5.6, albumin 2.8, SGOT 380, SGPT 464, direct bilirubin 0.5, otherwise normal.  Hepatitis panel:  Pending.  Urinalysis:  Urobilinogen 1.0.  HOSPITAL COURSE:  Following transfer from [facility], the patient proceeded with same-day diagnostic coronary angiogram performed by Allene Dillon, M.D. (see catheterization report for details).  Angiography revealed normal coronary arteries/left ventricle.  The patient was noted to have significant HTN (170/110) treated with labetalol and nitroglycerin.  [facility] was consulted for noncardiac chest discomfort and elevated LFTs.  Hepatobiliary scan was normal and an EGD revealed a 1 cm superficial linear prepyloric erosion.  The patient was placed on Protonix  and plans were to proceed with outpatient manometry scheduled to be performed here at [facility]. [facility].  The elevated LFTs were felt to represent chronic liver disease.  Hepatitis panel pending at discharge.  DISCHARGE MEDICATIONS: 1. Protonix 40 mg q.d. 2. Norvasc 5 mg q.d. 3. GlucoVance 5/500 mg q.d.  DISPOSITION The patient is to proceed to the [facility] admitting department on August 14, 1999, at 10 a.m. for swallowing studies (manometry).  Instructions regarding refraining from eating or drinking that morning have been provided.  The patient will then follow up with Sandy Salaam. Deatra Ina, M.D., [facility], on August 23, 1999, at 2:30 p.m.  DISCHARGE DIAGNOSES: 1. Noncardiac chest pain.    a. Cardiac catheterization on July 27, 1999, with normal coronary       arteries/left ventricle. 2. Gastric erosion.    a. Esophagogastroduodenoscopy on July 28, 1999. 3. Elevated liver enzymes. 4. Type 2 diabetes mellitus. 5. Hypertension. DD:  07/28/99 TD:  07/28/99 Job: [id number]

## 2011-07-10 IMAGING — RF DG UGI W/ HIGH DENSITY W/KUB
14 of 24 series · 14 of 24 positions shown · non-contrast
Comparison: None.

CLINICAL DATA: 49-year-old female with dysphasia, history of
esophageal dilatation

UPPER GI SERIES WITH KUB
TECHNIQUE: Routine upper GI series was performed with thin and
high density barium.
Fluoroscopy Time: 5.6 minutes

[Series 1: run · 1 of 1 slices shown (1 of 14)]
[im 1/1]
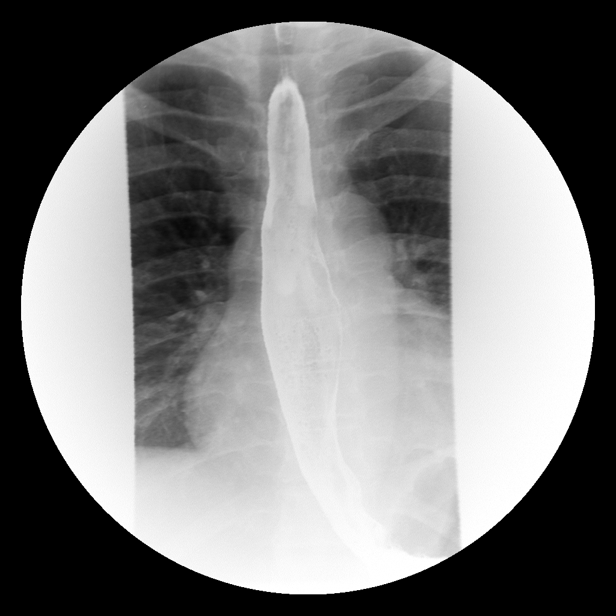

[Series 3: run · 1 of 1 slices shown (2 of 14)]
[im 1/1]
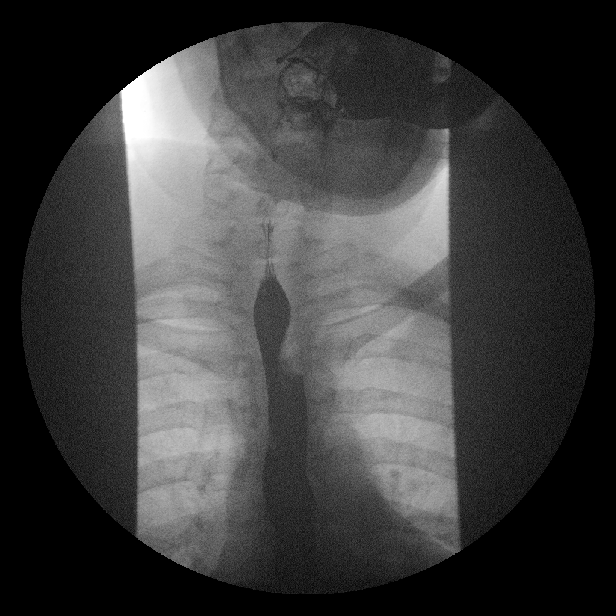

[Series 5: run · 1 of 1 slices shown (3 of 14)]
[im 1/1]
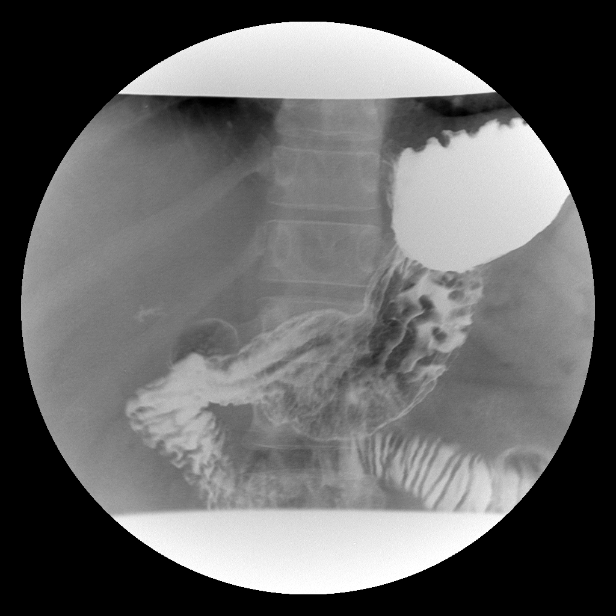

[Series 7: run · 1 of 1 slices shown (4 of 14)]
[im 1/1]
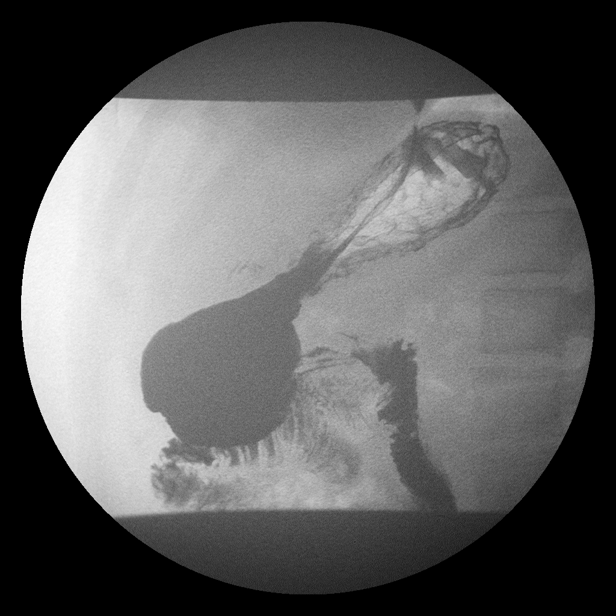

[Series 8: run · 1 of 1 slices shown (5 of 14)]
[im 1/1]
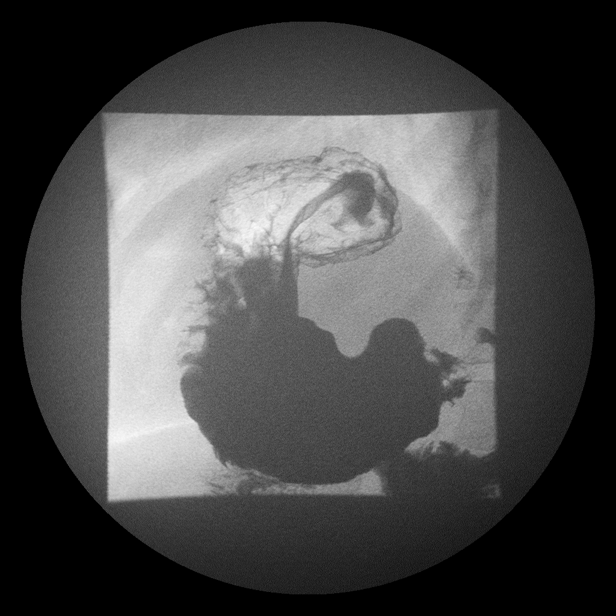

[Series 10: run · 1 of 1 slices shown (6 of 14)]
[im 1/1]
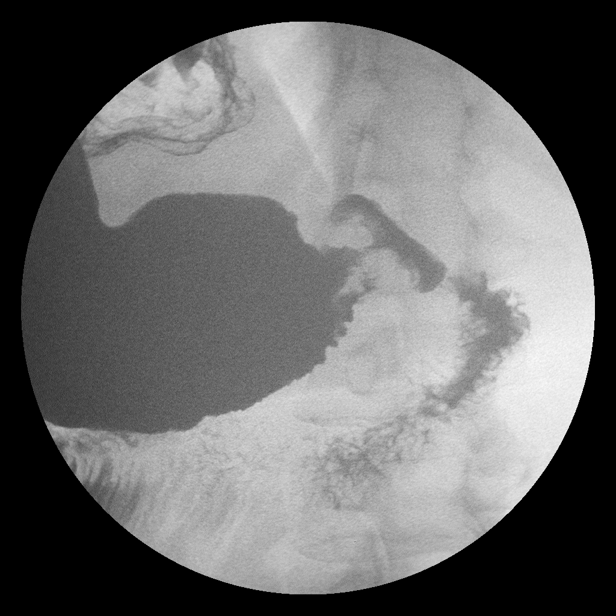

[Series 12: run · 1 of 1 slices shown (7 of 14)]
[im 1/1]
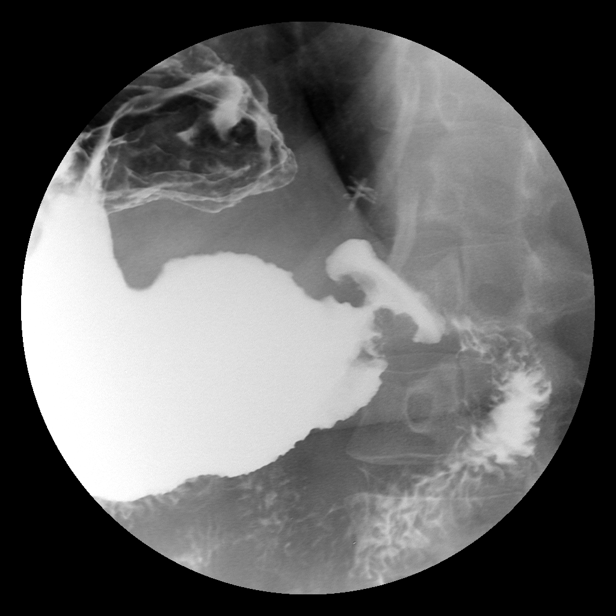

[Series 13: run · 1 of 1 slices shown (8 of 14)]
[im 1/1]
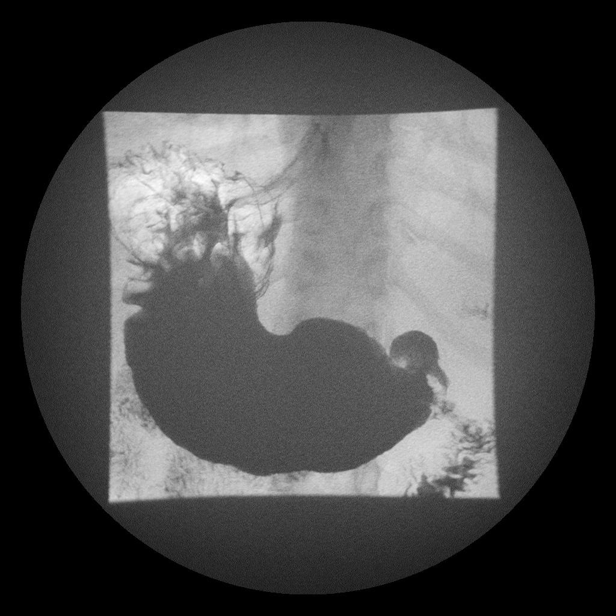

[Series 15: run · 1 of 1 slices shown (9 of 14)]
[im 1/1]
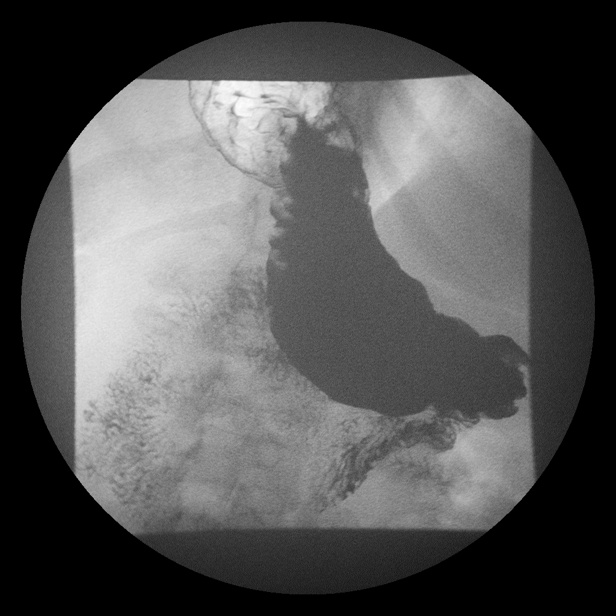

[Series 17: run · 1 of 1 slices shown (10 of 14)]
[im 1/1]
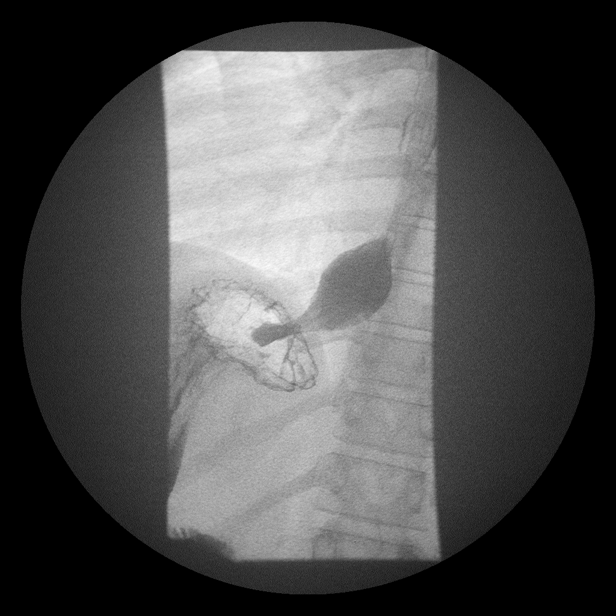

[Series 19: run · 1 of 1 slices shown (11 of 14)]
[im 1/1]
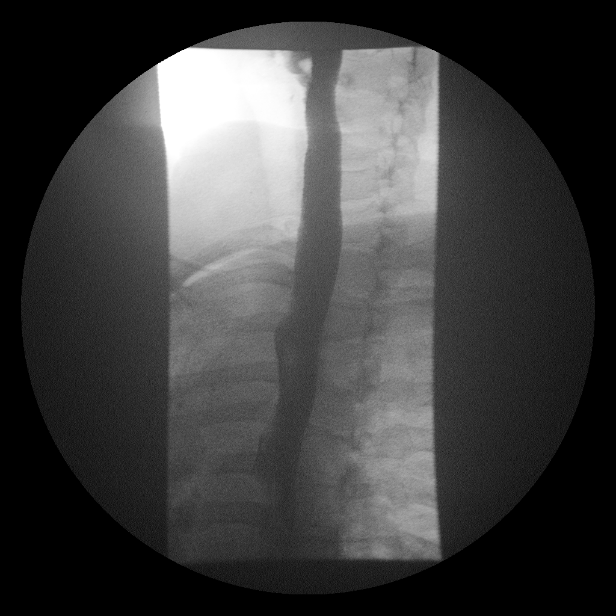

[Series 20: run · 1 of 1 slices shown (12 of 14)]
[im 1/1]
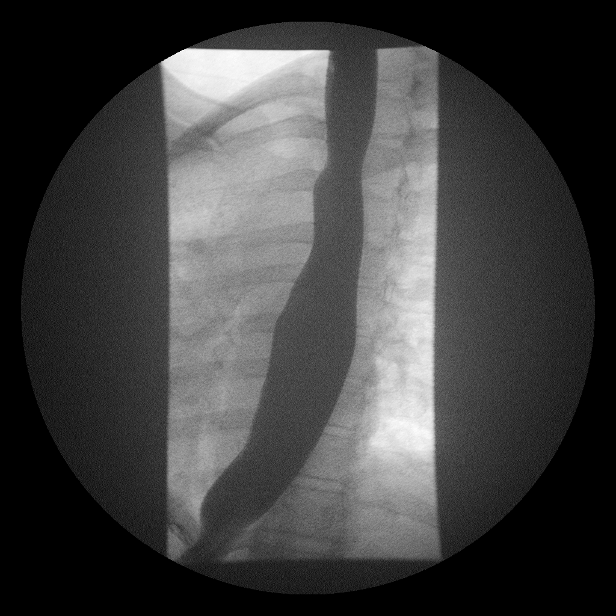

[Series 22: run · 1 of 1 slices shown (13 of 14)]
[im 1/1]
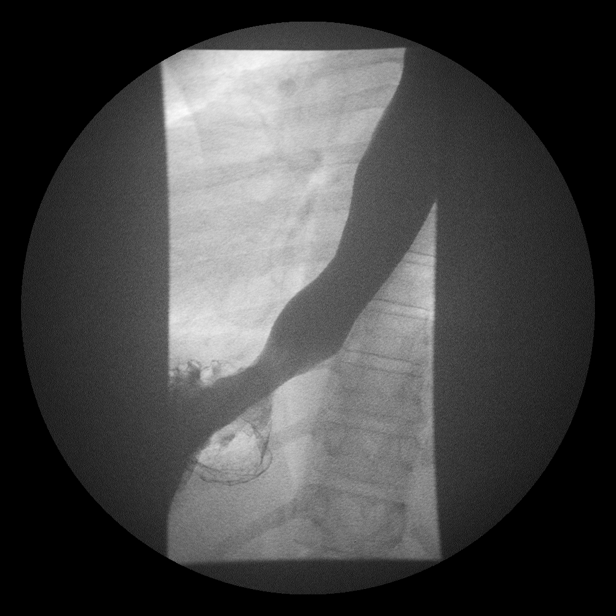

[Series 24: run · 1 of 1 slices shown (14 of 14)]
[im 1/1]
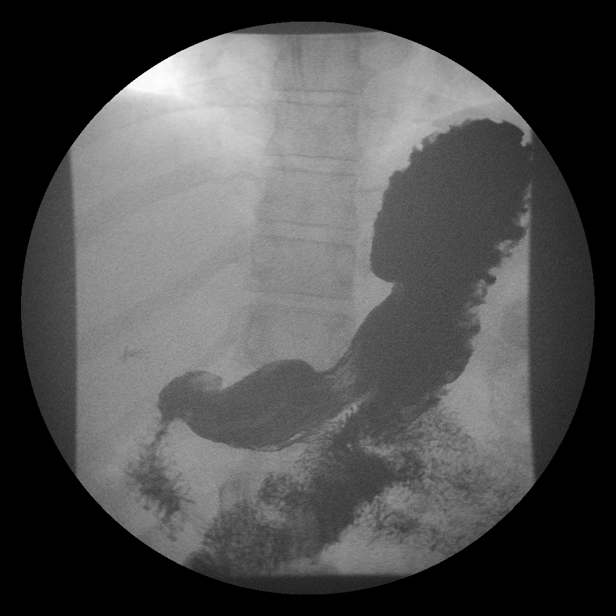

[14 of 24 positions shown; findings below may reference images not displayed]

FINDINGS: A single scout view is obtained of the abdomen
demonstrating a nonobstructive bowel gas pattern.  Phleboliths are
seen in the pelvis the patient has had a cholecystectomy with clips
in the right upper quadrant.

The esophagus is normal in caliber without evidence of stricture on
today's exam.  No hiatal hernia is seen.  A barium pill passes
through the esophagus without difficulty. A large volume of reflux
is seen throughout the course of the exam.  The esophageal motility
is otherwise normal.

The distensibility of the stomach is normal.  No abnormalities are
seen in the stomach or duodenum.  The duodenal bulb is
unremarkable.
IMPRESSION: Gastroesophageal reflux as detailed above.  No evidence of
esophageal stricture.

## 2011-12-17 ENCOUNTER — Emergency Department (HOSPITAL_COMMUNITY)
Admission: EM | Admit: 2011-12-17 | Discharge: 2011-12-17 | Disposition: A | Discharge status: Home / Self Care | Payer: PRIVATE HEALTH INSURANCE | Attending: Emergency Medicine | Admitting: Emergency Medicine

## 2011-12-17 ENCOUNTER — Encounter (HOSPITAL_COMMUNITY): Payer: Self-pay | Admitting: *Deleted

## 2011-12-17 DIAGNOSIS — M25579 Pain in unspecified ankle and joints of unspecified foot: Secondary | ICD-10-CM | POA: Insufficient documentation

## 2011-12-17 DIAGNOSIS — M898X9 Other specified disorders of bone, unspecified site: Secondary | ICD-10-CM | POA: Insufficient documentation

## 2011-12-17 DIAGNOSIS — E119 Type 2 diabetes mellitus without complications: Secondary | ICD-10-CM | POA: Insufficient documentation

## 2011-12-17 DIAGNOSIS — B1920 Unspecified viral hepatitis C without hepatic coma: Secondary | ICD-10-CM | POA: Insufficient documentation

## 2011-12-17 DIAGNOSIS — Z79899 Other long term (current) drug therapy: Secondary | ICD-10-CM | POA: Insufficient documentation

## 2011-12-17 DIAGNOSIS — M7730 Calcaneal spur, unspecified foot: Secondary | ICD-10-CM

## 2011-12-17 HISTORY — DX: Unspecified viral hepatitis C without hepatic coma: B19.20

## 2011-12-17 MED ORDER — DIPHENHYDRAMINE HCL 25 MG PO CAPS
25.0000 mg | ORAL_CAPSULE | Freq: Once | ORAL | Status: AC
  Administered 2011-12-17: 25 mg via ORAL
  Filled 2011-12-17: qty 1

## 2011-12-17 MED ORDER — HYDROCODONE-ACETAMINOPHEN 5-325 MG PO TABS
2.0000 | ORAL_TABLET | Freq: Once | ORAL | Status: AC
  Administered 2011-12-17: 2 via ORAL
  Filled 2011-12-17: qty 2

## 2011-12-17 MED ORDER — OXYCODONE-ACETAMINOPHEN 5-325 MG PO TABS: 2.0000 | ORAL_TABLET | ORAL | Status: DC | PRN

## 2011-12-17 NOTE — ED Notes (Signed)
Pt reports R heel pain-states she has heel spur.  Went to see a foot doctor x 2 weeks ago and was told that she needed a "hard bottom" shoe.

## 2011-12-17 NOTE — ED Provider Notes (Signed)
Medical screening examination/treatment/procedure(s) were performed by non-physician practitioner and as supervising physician I was immediately available for consultation/collaboration.    Dot Lanes, MD 12/17/11 2046

## 2011-12-17 NOTE — ED Provider Notes (Signed)
History     CSN: [id number]  Arrival date & time 12/17/11  [id number]   First MD Initiated Contact with Patient 12/17/11 1021      Chief Complaint  Patient presents with  . Foot Pain    R heel    (Consider location/radiation/quality/duration/timing/severity/associated sxs/prior treatment) HPI Comments: Patient is a 51 year old female who presents with right heel pain for the past couple weeks. The pain started gradually and progressively worsened since the onset. The pain is sharp, aching, and severe. The pain is located on the bottom of her right heel. Any weight bearing on the affected heel makes the pain unbearable. Patient has seen her PCP and Podiatrist for symptom relief but the patient would like a "shot" or some faster pain relief so she can work 12 hour shifts on her feet. Patient has tried tylenol, ibuprofen, and shoe inserts for symptoms without relief. No alleviating factors. Patient denies associated symptoms. Patient denies injury, numbness/tingling, weakness, swelling, color change of extremities.    Past Medical History  Diagnosis Date  . Diabetes mellitus without complication   . Hepatitis C     Past Surgical History  Procedure Date  . Cholecystectomy   . Abdominal hysterectomy   . Appendectomy     No family history on file.  History  Substance Use Topics  . Smoking status: Never Smoker   . Smokeless tobacco: Not on file  . Alcohol Use: No    OB History    Grav Para Term Preterm Abortions TAB SAB Ect Mult Living                  Review of Systems  Musculoskeletal: Positive for arthralgias.  All other systems reviewed and are negative.    Allergies  Codeine  Home Medications   Current Outpatient Rx  Name  Route  Sig  Dispense  Refill  . GLIMEPIRIDE 4 MG PO TABS   Oral   Take 4 mg by mouth 2 (two) times daily.         . IBUPROFEN 800 MG PO TABS   Oral   Take 800 mg by mouth every 8 (eight) hours as needed. Pain         . MAGNESIUM OXIDE  400 MG PO TABS   Oral   Take 400 mg by mouth daily.         Marland Kitchen METFORMIN HCL 1000 MG PO TABS   Oral   Take 1,000 mg by mouth 2 (two) times daily.         . MULTI-VITAMIN/MINERALS PO TABS   Oral   Take 1 tablet by mouth daily.         Marland Kitchen NITROSTAT 0.4 MG SL SUBL      USE AS NEEDED FOR CHEST PAIN AND CHOKING   30 each   2     BP 146/90  Pulse 81  Temp 98.5 F (36.9 C) (Oral)  Resp 20  SpO2 100%  Physical Exam  Nursing note and vitals reviewed. Constitutional: She is oriented to person, place, and time. She appears well-developed and well-nourished. No distress.  HENT:  Head: Normocephalic and atraumatic.  Eyes: Conjunctivae normal and EOM are normal. Pupils are equal, round, and reactive to light.  Neck: Normal range of motion. Neck supple.  Cardiovascular: Normal rate and regular rhythm.  Exam reveals no gallop and no friction rub.   No murmur heard. Pulmonary/Chest: Effort normal and breath sounds normal. She has no wheezes. She has  no rales. She exhibits no tenderness.  Abdominal: Soft. She exhibits no distension.  Musculoskeletal: Normal range of motion.       Volar aspect of right heel tender to palpation. No obvious deformity, swelling or color changes. No other joint affected. Right ankle and toes ROM intact.   Neurological: She is alert and oriented to person, place, and time. Coordination normal.       Strength and sensation equal and intact bilaterally. Speech is goal-oriented. Moves limbs without ataxia.   Skin: Skin is warm and dry. She is not diaphoretic.  Psychiatric: She has a normal mood and affect. Her behavior is normal.    ED Course  Procedures (including critical care time)  Labs Reviewed - No data to display No results found.   1. Heel spur       MDM  10:59 AM Patient will have Vicodin for pain. Patient will be discharged with recommended follow up with Ortho and Podiatry. She will call and see which can provide her the most  appropriate care. No imaging needed and no further evaluation required at this time.         Alvina Chou, PA-C 12/17/11 1609

## 2011-12-27 ENCOUNTER — Other Ambulatory Visit (HOSPITAL_COMMUNITY): Payer: Self-pay | Admitting: Orthopedic Surgery

## 2011-12-27 ENCOUNTER — Ambulatory Visit (HOSPITAL_COMMUNITY)
Admission: RE | Admit: 2011-12-27 | Discharge: 2011-12-27 | Disposition: A | Discharge status: Home / Self Care | Payer: PRIVATE HEALTH INSURANCE | Source: Ambulatory Visit | Attending: Orthopedic Surgery | Admitting: Orthopedic Surgery

## 2011-12-27 DIAGNOSIS — M549 Dorsalgia, unspecified: Secondary | ICD-10-CM

## 2011-12-27 DIAGNOSIS — Z1389 Encounter for screening for other disorder: Secondary | ICD-10-CM | POA: Insufficient documentation

## 2012-01-28 ENCOUNTER — Ambulatory Visit (HOSPITAL_COMMUNITY)
Admission: RE | Admit: 2012-01-28 | Discharge: 2012-01-28 | Disposition: A | Discharge status: Home / Self Care | Payer: PRIVATE HEALTH INSURANCE | Source: Ambulatory Visit | Attending: Podiatry | Admitting: Podiatry

## 2012-01-28 ENCOUNTER — Other Ambulatory Visit (HOSPITAL_COMMUNITY): Payer: Self-pay | Admitting: Podiatry

## 2012-01-28 DIAGNOSIS — I82409 Acute embolism and thrombosis of unspecified deep veins of unspecified lower extremity: Secondary | ICD-10-CM

## 2012-01-28 NOTE — Progress Notes (Signed)
Right lower extremity venous doppler completed.

## 2012-02-26 ENCOUNTER — Encounter: Payer: Self-pay | Admitting: Gastroenterology

## 2012-03-26 ENCOUNTER — Ambulatory Visit (AMBULATORY_SURGERY_CENTER): Payer: PRIVATE HEALTH INSURANCE | Admitting: *Deleted

## 2012-03-26 VITALS — Ht 65.0 in | Wt 235.4 lb

## 2012-03-26 DIAGNOSIS — Z1211 Encounter for screening for malignant neoplasm of colon: Secondary | ICD-10-CM

## 2012-03-26 MED ORDER — NA SULFATE-K SULFATE-MG SULF 17.5-3.13-1.6 GM/177ML PO SOLN: 1.0000 | Freq: Once | ORAL | Status: DC

## 2012-03-26 NOTE — Progress Notes (Signed)
No egg or soy allergy. ewm No problems with sedation or intubation. ewm

## 2012-03-31 ENCOUNTER — Encounter: Payer: Self-pay | Admitting: Gastroenterology

## 2012-04-08 ENCOUNTER — Encounter: Payer: PRIVATE HEALTH INSURANCE | Admitting: Gastroenterology

## 2012-04-10 ENCOUNTER — Encounter: Payer: Self-pay | Admitting: Gastroenterology

## 2012-04-10 ENCOUNTER — Other Ambulatory Visit: Payer: Self-pay | Admitting: Gastroenterology

## 2012-04-10 ENCOUNTER — Ambulatory Visit (AMBULATORY_SURGERY_CENTER): Payer: PRIVATE HEALTH INSURANCE | Admitting: Gastroenterology

## 2012-04-10 VITALS — BP 167/92 | HR 65 | Temp 96.7°F | Resp 20 | Ht 65.0 in | Wt 235.0 lb

## 2012-04-10 DIAGNOSIS — Z1211 Encounter for screening for malignant neoplasm of colon: Secondary | ICD-10-CM

## 2012-04-10 HISTORY — PX: COLONOSCOPY: SHX174

## 2012-04-10 MED ORDER — SODIUM CHLORIDE 0.9 % IV SOLN: 500.0000 mL | INTRAVENOUS | Status: DC

## 2012-04-10 NOTE — Progress Notes (Signed)
[place] juice prior to discharge.

## 2012-04-10 NOTE — Patient Instructions (Signed)
YOU HAD AN ENDOSCOPIC PROCEDURE TODAY AT THE [facility]: Refer to the procedure report that was given to you for any specific questions about what was found during the examination.  If the procedure report does not answer your questions, please call your gastroenterologist to clarify.  If you requested that your care partner not be given the details of your procedure findings, then the procedure report has been included in a sealed envelope for you to review at your convenience later.  YOU SHOULD EXPECT: Some feelings of bloating in the abdomen. Passage of more gas than usual.  Walking can help get rid of the air that was put into your GI tract during the procedure and reduce the bloating. If you had a lower endoscopy (such as a colonoscopy or flexible sigmoidoscopy) you may notice spotting of blood in your stool or on the toilet paper. If you underwent a bowel prep for your procedure, then you may not have a normal bowel movement for a few days.  DIET: Your first meal following the procedure should be a light meal and then it is ok to progress to your normal diet.  A half-sandwich or bowl of soup is an example of a good first meal.  Heavy or fried foods are harder to digest and may make you feel nauseous or bloated.  Likewise meals heavy in dairy and vegetables can cause extra gas to form and this can also increase the bloating.  Drink plenty of fluids but you should avoid alcoholic beverages for 24 hours.  ACTIVITY: Your care partner should take you home directly after the procedure.  You should plan to take it easy, moving slowly for the rest of the day.  You can resume normal activity the day after the procedure however you should NOT DRIVE or use heavy machinery for 24 hours (because of the sedation medicines used during the test).    SYMPTOMS TO REPORT IMMEDIATELY: A gastroenterologist can be reached at any hour.  During normal business hours, 8:30 AM to 5:00 PM Monday through Friday,  call [phone].  After hours and on weekends, please call the GI answering service at [phone] who will take a message and have the physician on call contact you.   Following lower endoscopy (colonoscopy or flexible sigmoidoscopy):  Excessive amounts of blood in the stool  Significant tenderness or worsening of abdominal pains  Swelling of the abdomen that is new, acute  Fever of 100F or higher    FOLLOW UP: If any biopsies were taken you will be contacted by phone or by letter within the next 1-3 weeks.  Call your gastroenterologist if you have not heard about the biopsies in 3 weeks.  Our staff will call the home number listed on your records the next business day following your procedure to check on you and address any questions or concerns that you may have at that time regarding the information given to you following your procedure. This is a courtesy call and so if there is no answer at the home number and we have not heard from you through the emergency physician on call, we will assume that you have returned to your regular daily activities without incident.  SIGNATURES/CONFIDENTIALITY: You and/or your care partner have signed paperwork which will be entered into your electronic medical record.  These signatures attest to the fact that that the information above on your After Visit Summary has been reviewed and is understood.  Full responsibility of the confidentiality   of this discharge information lies with you and/or your care-partner.     

## 2012-04-10 NOTE — Progress Notes (Signed)
Patient did not experience any of the following events: a burn prior to discharge; a fall within the facility; wrong site/side/patient/procedure/implant event; or a hospital transfer or hospital admission upon discharge from the facility. (G8907) Patient did not have preoperative order for IV antibiotic SSI prophylaxis. (G8918)  

## 2012-04-10 NOTE — Op Note (Signed)
[place]  [address]. [place], [id number]   COLONOSCOPY PROCEDURE REPORT  PATIENT: [name] [name], [name] [name]  MR#: [id number] BIRTHDATE: August 22, 1960 , 51  yrs. old GENDER: Female ENDOSCOPIST: Inda Castle, MD REFERRED BY:Jim Spero Curb, M.D. PROCEDURE DATE:  04/10/2012 PROCEDURE:   Colonoscopy, diagnostic ASA CLASS:   Class II INDICATIONS:average risk screening. MEDICATIONS: MAC sedation, administered by CRNA and propofol (Diprivan) 200mg  IV  DESCRIPTION OF PROCEDURE:   After the risks benefits and alternatives of the procedure were thoroughly explained, informed consent was obtained.  A digital rectal exam revealed no abnormalities of the rectum.   The LB [id number]  endoscope was introduced through the anus and advanced to the cecum, which was identified by both the appendix and ileocecal valve. No adverse events experienced.   The quality of the prep was Suprep excellent The instrument was then slowly withdrawn as the colon was fully examined.      COLON FINDINGS: A normal appearing cecum, ileocecal valve, and appendiceal orifice were identified.  The ascending, hepatic flexure, transverse, splenic flexure, descending, sigmoid colon and rectum appeared unremarkable.  No polyps or cancers were seen. Retroflexed views revealed no abnormalities. The time to cecum=1 minutes 36 seconds.  Withdrawal time=7 minutes 49 seconds.  The scope was withdrawn and the procedure completed. COMPLICATIONS: There were no complications.  ENDOSCOPIC IMPRESSION: Normal colon  RECOMMENDATIONS: Continue current colorectal screening recommendations for "routine risk" patients with a repeat colonoscopy in 10 years.   eSigned:  Inda Castle, MD 04/10/2012 2:43 PM   cc:

## 2012-04-10 NOTE — Progress Notes (Signed)
A/ox3 pleased with MAC report to Karen RN 

## 2012-04-11 ENCOUNTER — Telehealth: Payer: Self-pay | Admitting: *Deleted

## 2012-04-11 NOTE — Telephone Encounter (Signed)
  Follow up Call-  Call back number 04/10/2012  Post procedure Call Back phone  # [phone]  Permission to leave phone message Yes     Patient questions:  Left message for patient to call if necessary.

## 2012-06-23 ENCOUNTER — Other Ambulatory Visit: Payer: Self-pay | Admitting: Orthopedic Surgery

## 2012-06-23 DIAGNOSIS — M545 Low back pain, unspecified: Secondary | ICD-10-CM

## 2012-07-03 ENCOUNTER — Other Ambulatory Visit: Payer: Self-pay | Admitting: Orthopedic Surgery

## 2012-07-03 ENCOUNTER — Ambulatory Visit
Admission: RE | Admit: 2012-07-03 | Discharge: 2012-07-03 | Disposition: A | Discharge status: Home / Self Care | Payer: PRIVATE HEALTH INSURANCE | Source: Ambulatory Visit | Attending: Orthopedic Surgery | Admitting: Orthopedic Surgery

## 2012-07-03 ENCOUNTER — Inpatient Hospital Stay: Admission: RE | Admit: 2012-07-03 | Payer: Self-pay | Source: Ambulatory Visit

## 2012-07-03 ENCOUNTER — Ambulatory Visit
Admission: RE | Admit: 2012-07-03 | Discharge: 2012-07-03 | Disposition: A | Discharge status: Home / Self Care | Payer: Self-pay | Source: Ambulatory Visit | Attending: Orthopedic Surgery | Admitting: Orthopedic Surgery

## 2012-07-03 ENCOUNTER — Other Ambulatory Visit: Payer: PRIVATE HEALTH INSURANCE

## 2012-07-03 VITALS — BP 92/44 | HR 72

## 2012-07-03 DIAGNOSIS — M545 Low back pain, unspecified: Secondary | ICD-10-CM

## 2012-07-03 DIAGNOSIS — M549 Dorsalgia, unspecified: Secondary | ICD-10-CM

## 2012-07-03 MED ORDER — IOHEXOL 180 MG/ML  SOLN
15.0000 mL | Freq: Once | INTRAMUSCULAR | Status: AC | PRN
  Administered 2012-07-03: 15 mL via INTRATHECAL

## 2012-07-03 MED ORDER — ONDANSETRON HCL 4 MG/2ML IJ SOLN
4.0000 mg | Freq: Once | INTRAMUSCULAR | Status: AC
  Administered 2012-07-03: 4 mg via INTRAMUSCULAR

## 2012-07-03 MED ORDER — MEPERIDINE HCL 100 MG/ML IJ SOLN
100.0000 mg | Freq: Once | INTRAMUSCULAR | Status: AC
  Administered 2012-07-03: 100 mg via INTRAMUSCULAR

## 2012-07-03 MED ORDER — DIAZEPAM 5 MG PO TABS
10.0000 mg | ORAL_TABLET | Freq: Once | ORAL | Status: AC
  Administered 2012-07-03: 10 mg via ORAL

## 2012-07-03 NOTE — Progress Notes (Signed)
Discharge instructions explained.

## 2012-11-10 ENCOUNTER — Encounter: Payer: Self-pay | Admitting: Gastroenterology

## 2012-12-12 ENCOUNTER — Encounter: Payer: Self-pay | Admitting: Physician Assistant

## 2012-12-12 ENCOUNTER — Ambulatory Visit: Payer: PRIVATE HEALTH INSURANCE | Admitting: Gastroenterology

## 2012-12-12 ENCOUNTER — Ambulatory Visit (INDEPENDENT_AMBULATORY_CARE_PROVIDER_SITE_OTHER): Payer: Managed Care, Other (non HMO) | Admitting: Physician Assistant

## 2012-12-12 VITALS — BP 130/80 | HR 75 | Ht 65.0 in | Wt 220.4 lb

## 2012-12-12 DIAGNOSIS — I10 Essential (primary) hypertension: Secondary | ICD-10-CM

## 2012-12-12 DIAGNOSIS — K224 Dyskinesia of esophagus: Secondary | ICD-10-CM

## 2012-12-12 DIAGNOSIS — R079 Chest pain, unspecified: Secondary | ICD-10-CM

## 2012-12-12 MED ORDER — OXYCODONE-ACETAMINOPHEN 5-325 MG PO TABS: 2.0000 | ORAL_TABLET | ORAL | Status: DC | PRN

## 2012-12-12 NOTE — Progress Notes (Signed)
Subjective:    Patient ID: [name] [name], female    DOB: 08/26/1960, 52 y.o.   MRN: [id number]  HPI  [name] [name] is a pleasant 52 year old African American female known to Dr. Deatra Ina who has undergone multiple endoscopies in the past for complaints of dysphagia and odynophagia as well as chest pain. Notes have indicated that she has a diagnosis of diffuse esophageal spasm. I cannot locate a manometry however the patient states that she had a manometry done at [facility] 51 or 3 years ago and was told that she definitely had esophageal spasms. Last EGD was done in July of 2011 with a normal-appearing esophagus she underwent balloon dilation and and Botox injections. She says that this is uncomfortable for a day or 2 after the procedure but then her dysphagia and chest pain resolved and she will go for a long period of time without any difficulty. She has recently had a hospitalization at [facility] last week after she presented there with chest pain and was also noted to have malignant hypertension. She has been started on antihypertensives and was also put on a nitrate and given nitroglycerin to use as needed. She underwent upper endoscopy with Dr. Lyda Jester showing multiple small ulcers in the distal stomach and she was placed on a PPI twice daily. She also had extensive cardiac workup which was unremarkable. She was advised to come back to Dr. Deatra Ina regarding further treatment of her esophageal spasms Patient says that she had been having a lot of difficulty over the past month and had been taking aspirin and NSAIDs as needed, she says I would take whatever I could get my hands on" to help relieve the chest pain. Since being on any medications she does feel that she is . somewhat improved but still has some level of chest pain present constantly. She says it never completely goes away. She's not feel that the pain is exertional in nature and does not seem to be affected by eating. Her husband also  reports that she has frequent episodes of choking and that he does not let her eat alone. She was also started on metoclopramide 10 mg 4 times daily during his last hospital stay.    Review of Systems  Constitutional: Negative.   HENT: Positive for trouble swallowing.   Eyes: Negative.   Respiratory: Positive for choking.   Cardiovascular: Positive for chest pain.  Gastrointestinal: Positive for abdominal pain.  Endocrine: Negative.   Genitourinary: Negative.   Musculoskeletal: Negative.   Allergic/Immunologic: Negative.   Neurological: Negative.   Hematological: Negative.   Psychiatric/Behavioral: Negative.    Outpatient Prescriptions Prior to Visit  Medication Sig Dispense Refill  . glimepiride (AMARYL) 4 MG tablet Take 4 mg by mouth 2 (two) times daily.      Marland Kitchen lisinopril (PRINIVIL,ZESTRIL) 2.5 MG tablet Take 20 mg by mouth. 2 tablets once a day      . NITROSTAT 0.4 MG SL tablet USE AS NEEDED FOR CHEST PAIN AND CHOKING  30 each  2  . oxyCODONE-acetaminophen (PERCOCET/ROXICET) 5-325 MG per tablet Take 2 tablets by mouth every 4 (four) hours as needed for pain.  15 tablet  0  . ibuprofen (ADVIL,MOTRIN) 800 MG tablet Take 800 mg by mouth every 8 (eight) hours as needed. Pain      . magnesium oxide (MAG-OX) 400 MG tablet Take 400 mg by mouth daily.      . metFORMIN (GLUCOPHAGE) 1000 MG tablet Take 1,000 mg by mouth 2 (two) times  daily.      . Multiple Vitamins-Minerals (MULTIVITAMIN WITH MINERALS) tablet Take 1 tablet by mouth daily.       No facility-administered medications prior to visit.   Allergies  Allergen Reactions  . Codeine Itching    No hives or rash, takes benadryl   Patient Active Problem List   Diagnosis Date Noted  . DIABETES MELLITUS-TYPE II 09/05/2009  . CHEST PAIN 03/09/2009  . DYSPHAGIA 03/09/2009  . NAUSEA AND VOMITING 12/20/2008  . HEPATITIS C, CHRONIC 03/10/2008  . HYPERTENSION 03/10/2008  . BRONCHITIS, CHRONIC 03/10/2008  . Dyskinesia of esophagus  03/10/2008  . ARTHRITIS 03/10/2008   History  Substance Use Topics  . Smoking status: Never Smoker   . Smokeless tobacco: Never Used  . Alcohol Use: No   family history includes Alzheimer's disease in her father; Hypertension in her mother; Throat cancer (age of onset: 70) in her sister. There is no history of Colon cancer, Rectal cancer, or Stomach cancer.     Objective:   Physical Exam   Well-developed African American female in no acute distress, pleasant accompanied by her husband. Blood pressure 130/80 pulse 75 height 5 foot 5 weight 220. HEENT nontraumatic normocephalic EOMI PERRLA sclera anicteric, Supple no JVD, Cardiovascular regular rate and rhythm with S1-S2 no murmur or gallop, Pulmonary clear bilaterally, Abdomen soft basically nontender there's no palpable mass or hepatosplenomegaly bowel sounds are present no guarding or rebound, Rectal exam not done, Extremities no clubbing cyanosis or edema skin warm and dry, Psych mood and affect normal and appropriate       Assessment & Plan:  #80 52 year old female with previous diagnosis of diffuse esophagitis esophageal spasm by records which has been responsive to balloon dilations and Botox injections with last procedure about 3 years ago. Patient now presents with 1 month history of recurrent chest pain dysphagia odynophagia and intermittent choking. Recent hospitalization at [facility] with negative cardiac workup. Symptoms are most consistent with diffuse esophageal spasm  #2 hypertension #3 diabetes mellitus #4 history of hepatitis C #5 osteoarthritis #6 probable NSAID-induced gastric ulcers on EGD done at [facility] 10 2014  Plan; patient will continue Prilosec 20 mg by mouth twice daily and advise her to avoid all aspirin and NSAIDs given recent diagnosis of an ulcer Discontinue Reglan Patient has been placed on Imdur were and will continue and has Nitrostat to use when necessary Will obtain copies of her  records from [facility]. Patient states that she had a manometry done there 2-3 years ago Schedule for EGD with probable balloon dilation and Botox injections with Dr. Deatra Ina. Oxycodone 5/325 #20 no refills for severe pain

## 2012-12-12 NOTE — Patient Instructions (Signed)
You have been scheduled for an endoscopy with balloon dilation and botox at [facility] .  Please follow written instructions given to you at your visit today. If you use inhalers (even only as needed), please bring them with you on the day of your procedure.  We have given you the following medications to take to your pharmacy for you to pick up at your convenience:  Percocet  Per Amy Esterwood, discontinue the Reglan.

## 2012-12-15 NOTE — Progress Notes (Signed)
Reviewed and agree with management. Yana Schorr D. Calib Wadhwa, M.D., FACG  

## 2012-12-22 ENCOUNTER — Ambulatory Visit: Payer: 59 | Admitting: Gastroenterology

## 2012-12-23 ENCOUNTER — Telehealth: Payer: Self-pay | Admitting: *Deleted

## 2012-12-23 NOTE — Telephone Encounter (Signed)
Per dr Guillermina City request pt has been rescheduled to Monday morning  Dr Myrla Halsted will be in the hospital on Monday morning I monved fridays case [name] [name] to Monday morning at 8:30am

## 2012-12-23 NOTE — Telephone Encounter (Signed)
ok 

## 2012-12-24 ENCOUNTER — Encounter: Payer: Self-pay | Admitting: Gastroenterology

## 2012-12-29 ENCOUNTER — Encounter (HOSPITAL_COMMUNITY)
Admission: RE | Disposition: A | Discharge status: Home / Self Care | Payer: Self-pay | Source: Ambulatory Visit | Attending: Gastroenterology

## 2012-12-29 ENCOUNTER — Encounter (HOSPITAL_COMMUNITY): Payer: 59 | Admitting: Registered Nurse

## 2012-12-29 ENCOUNTER — Ambulatory Visit (HOSPITAL_COMMUNITY): Payer: 59 | Admitting: Registered Nurse

## 2012-12-29 ENCOUNTER — Encounter (HOSPITAL_COMMUNITY): Payer: Self-pay | Admitting: Gastroenterology

## 2012-12-29 ENCOUNTER — Ambulatory Visit (HOSPITAL_COMMUNITY)
Admission: RE | Admit: 2012-12-29 | Discharge: 2012-12-29 | Disposition: A | Discharge status: Home / Self Care | Payer: 59 | Source: Ambulatory Visit | Attending: Gastroenterology | Admitting: Gastroenterology

## 2012-12-29 DIAGNOSIS — I10 Essential (primary) hypertension: Secondary | ICD-10-CM | POA: Insufficient documentation

## 2012-12-29 DIAGNOSIS — R079 Chest pain, unspecified: Secondary | ICD-10-CM | POA: Insufficient documentation

## 2012-12-29 DIAGNOSIS — R1310 Dysphagia, unspecified: Secondary | ICD-10-CM | POA: Insufficient documentation

## 2012-12-29 DIAGNOSIS — Z79899 Other long term (current) drug therapy: Secondary | ICD-10-CM | POA: Insufficient documentation

## 2012-12-29 DIAGNOSIS — B182 Chronic viral hepatitis C: Secondary | ICD-10-CM | POA: Insufficient documentation

## 2012-12-29 DIAGNOSIS — K2940 Chronic atrophic gastritis without bleeding: Secondary | ICD-10-CM | POA: Insufficient documentation

## 2012-12-29 DIAGNOSIS — K2960 Other gastritis without bleeding: Secondary | ICD-10-CM

## 2012-12-29 DIAGNOSIS — K259 Gastric ulcer, unspecified as acute or chronic, without hemorrhage or perforation: Secondary | ICD-10-CM

## 2012-12-29 DIAGNOSIS — E119 Type 2 diabetes mellitus without complications: Secondary | ICD-10-CM | POA: Insufficient documentation

## 2012-12-29 DIAGNOSIS — M1990 Unspecified osteoarthritis, unspecified site: Secondary | ICD-10-CM | POA: Insufficient documentation

## 2012-12-29 DIAGNOSIS — K222 Esophageal obstruction: Secondary | ICD-10-CM

## 2012-12-29 DIAGNOSIS — K224 Dyskinesia of esophagus: Secondary | ICD-10-CM

## 2012-12-29 HISTORY — PX: BALLOON DILATION: SHX5330

## 2012-12-29 HISTORY — PX: BOTOX INJECTION: SHX5754

## 2012-12-29 HISTORY — PX: ESOPHAGOGASTRODUODENOSCOPY: SHX5428

## 2012-12-29 LAB — GLUCOSE, CAPILLARY
Glucose-Capillary: 436 mg/dL — ABNORMAL HIGH (ref 70–99)
Glucose-Capillary: 393 mg/dL — ABNORMAL HIGH (ref 70–99)
Glucose-Capillary: 287 mg/dL — ABNORMAL HIGH (ref 70–99)
Glucose-Capillary: 240 mg/dL — ABNORMAL HIGH (ref 70–99)

## 2012-12-29 SURGERY — EGD (ESOPHAGOGASTRODUODENOSCOPY)
Anesthesia: Monitor Anesthesia Care

## 2012-12-29 MED ORDER — NON FORMULARY: 6.0000 [IU] | Freq: Once | Status: DC

## 2012-12-29 MED ORDER — LABETALOL HCL 5 MG/ML IV SOLN
INTRAVENOUS | Status: DC | PRN
  Administered 2012-12-29: 5 mg via INTRAVENOUS

## 2012-12-29 MED ORDER — MIDAZOLAM HCL 5 MG/5ML IJ SOLN
INTRAMUSCULAR | Status: DC | PRN
  Administered 2012-12-29: 1 mg via INTRAVENOUS

## 2012-12-29 MED ORDER — GLYCOPYRROLATE 0.2 MG/ML IJ SOLN
INTRAMUSCULAR | Status: DC | PRN
  Administered 2012-12-29: 0.2 mg via INTRAVENOUS

## 2012-12-29 MED ORDER — MIDAZOLAM HCL 10 MG/2ML IJ SOLN
INTRAMUSCULAR | Status: AC
  Filled 2012-12-29: qty 2

## 2012-12-29 MED ORDER — INSULIN ASPART 100 UNIT/ML ~~LOC~~ SOLN
6.0000 [IU] | Freq: Once | SUBCUTANEOUS | Status: AC
  Administered 2012-12-29: 6 [IU] via SUBCUTANEOUS
  Filled 2012-12-29: qty 0.06

## 2012-12-29 MED ORDER — DIPHENHYDRAMINE HCL 50 MG/ML IJ SOLN
INTRAMUSCULAR | Status: AC
  Filled 2012-12-29: qty 1

## 2012-12-29 MED ORDER — BUTAMBEN-TETRACAINE-BENZOCAINE 2-2-14 % EX AERO
INHALATION_SPRAY | CUTANEOUS | Status: DC | PRN
  Administered 2012-12-29: 1 via TOPICAL
  Administered 2012-12-29: 2 via TOPICAL

## 2012-12-29 MED ORDER — FENTANYL CITRATE 0.05 MG/ML IJ SOLN
INTRAMUSCULAR | Status: AC
  Filled 2012-12-29: qty 2

## 2012-12-29 MED ORDER — FENTANYL CITRATE 0.05 MG/ML IJ SOLN
INTRAMUSCULAR | Status: DC | PRN
  Administered 2012-12-29: 50 ug via INTRAVENOUS

## 2012-12-29 MED ORDER — SODIUM CHLORIDE 0.9 % IV SOLN
INTRAVENOUS | Status: DC
  Administered 2012-12-29: 09:00:00 via INTRAVENOUS
  Administered 2012-12-29: 500 mL via INTRAVENOUS

## 2012-12-29 MED ORDER — TRAMADOL HCL 50 MG PO TABS: 50.0000 mg | ORAL_TABLET | Freq: Four times a day (QID) | ORAL | Status: DC | PRN

## 2012-12-29 MED ORDER — INSULIN ASPART 100 UNIT/ML ~~LOC~~ SOLN
6.0000 [IU] | Freq: Once | SUBCUTANEOUS | Status: AC
  Administered 2012-12-29: 6 [IU] via SUBCUTANEOUS
  Filled 2012-12-29 (×2): qty 0.06

## 2012-12-29 MED ORDER — LIDOCAINE HCL (CARDIAC) 20 MG/ML IV SOLN
INTRAVENOUS | Status: DC | PRN
  Administered 2012-12-29: 50 mg via INTRAVENOUS

## 2012-12-29 MED ORDER — PROPOFOL INFUSION 10 MG/ML OPTIME
INTRAVENOUS | Status: DC | PRN
  Administered 2012-12-29: 200 ug/kg/min via INTRAVENOUS

## 2012-12-29 MED ORDER — SODIUM CHLORIDE 0.9 % IJ SOLN
100.0000 [IU] | Freq: Once | INTRAMUSCULAR | Status: AC
  Administered 2012-12-29: 100 [IU] via SUBMUCOSAL
  Filled 2012-12-29: qty 100

## 2012-12-29 NOTE — Anesthesia Preprocedure Evaluation (Addendum)
Anesthesia Evaluation    Airway Mallampati: II TM Distance: >3 FB     Dental  (+) Dental Advisory Given   Pulmonary  breath sounds clear to auscultation        Cardiovascular hypertension, Pt. on medications Rhythm:Regular Rate:Normal     Neuro/Psych    GI/Hepatic GERD-  ,(+) Hepatitis -, C  Endo/Other  diabetes (Patient arrives with poor diabetes control; spoke with Dr. Deatra Ina and due to nauture of her ongoing GERD/esophagel spasm, wil e;;ect to proceed.), Poorly Controlled, Type 2, Oral Hypoglycemic Agents  Renal/GU      Musculoskeletal   Abdominal   Peds  Hematology   Anesthesia Other Findings   Reproductive/Obstetrics                          Anesthesia Physical Anesthesia Plan  ASA: III  Anesthesia Plan: MAC   Post-op Pain Management:    Induction: Intravenous  Airway Management Planned: Nasal Cannula  Additional Equipment:   Intra-op Plan:   Post-operative Plan:   Informed Consent:   Plan Discussed with: CRNA  Anesthesia Plan Comments:         Anesthesia Quick Evaluation

## 2012-12-29 NOTE — H&P (View-Only) (Signed)
Subjective:    Patient ID: [name] [name], female    DOB: April 12, 1960, 52 y.o.   MRN: [id number]  HPI  [name] [name] is a pleasant 52 year old African American female known to Dr. Deatra Ina who has undergone multiple endoscopies in the past for complaints of dysphagia and odynophagia as well as chest pain. Notes have indicated that she has a diagnosis of diffuse esophageal spasm. I cannot locate a manometry however the patient states that she had a manometry done at [facility] 2 or 3 years ago and was told that she definitely had esophageal spasms. Last EGD was done in July of 2011 with a normal-appearing esophagus she underwent balloon dilation and and Botox injections. She says that this is uncomfortable for a day or 2 after the procedure but then her dysphagia and chest pain resolved and she will go for a long period of time without any difficulty. She has recently had a hospitalization at [facility] last week after she presented there with chest pain and was also noted to have malignant hypertension. She has been started on antihypertensives and was also put on a nitrate and given nitroglycerin to use as needed. She underwent upper endoscopy with Dr. Lyda Jester showing multiple small ulcers in the distal stomach and she was placed on a PPI twice daily. She also had extensive cardiac workup which was unremarkable. She was advised to come back to Dr. Deatra Ina regarding further treatment of her esophageal spasms Patient says that she had been having a lot of difficulty over the past month and had been taking aspirin and NSAIDs as needed, she says I would take whatever I could get my hands on" to help relieve the chest pain. Since being on any medications she does feel that she is . somewhat improved but still has some level of chest pain present constantly. She says it never completely goes away. She's not feel that the pain is exertional in nature and does not seem to be affected by eating. Her husband also  reports that she has frequent episodes of choking and that he does not let her eat alone. She was also started on metoclopramide 10 mg 4 times daily during his last hospital stay.    Review of Systems  Constitutional: Negative.   HENT: Positive for trouble swallowing.   Eyes: Negative.   Respiratory: Positive for choking.   Cardiovascular: Positive for chest pain.  Gastrointestinal: Positive for abdominal pain.  Endocrine: Negative.   Genitourinary: Negative.   Musculoskeletal: Negative.   Allergic/Immunologic: Negative.   Neurological: Negative.   Hematological: Negative.   Psychiatric/Behavioral: Negative.    Outpatient Prescriptions Prior to Visit  Medication Sig Dispense Refill  . glimepiride (AMARYL) 4 MG tablet Take 4 mg by mouth 2 (two) times daily.      Marland Kitchen lisinopril (PRINIVIL,ZESTRIL) 2.5 MG tablet Take 20 mg by mouth. 2 tablets once a day      . NITROSTAT 0.4 MG SL tablet USE AS NEEDED FOR CHEST PAIN AND CHOKING  30 each  2  . oxyCODONE-acetaminophen (PERCOCET/ROXICET) 5-325 MG per tablet Take 2 tablets by mouth every 4 (four) hours as needed for pain.  15 tablet  0  . ibuprofen (ADVIL,MOTRIN) 800 MG tablet Take 800 mg by mouth every 8 (eight) hours as needed. Pain      . magnesium oxide (MAG-OX) 400 MG tablet Take 400 mg by mouth daily.      . metFORMIN (GLUCOPHAGE) 1000 MG tablet Take 1,000 mg by mouth 2 (two) times  daily.      . Multiple Vitamins-Minerals (MULTIVITAMIN WITH MINERALS) tablet Take 1 tablet by mouth daily.       No facility-administered medications prior to visit.   Allergies  Allergen Reactions  . Codeine Itching    No hives or rash, takes benadryl   Patient Active Problem List   Diagnosis Date Noted  . DIABETES MELLITUS-TYPE II 09/05/2009  . CHEST PAIN 03/09/2009  . DYSPHAGIA 03/09/2009  . NAUSEA AND VOMITING 12/20/2008  . HEPATITIS C, CHRONIC 03/10/2008  . HYPERTENSION 03/10/2008  . BRONCHITIS, CHRONIC 03/10/2008  . Dyskinesia of esophagus  03/10/2008  . ARTHRITIS 03/10/2008   History  Substance Use Topics  . Smoking status: Never Smoker   . Smokeless tobacco: Never Used  . Alcohol Use: No   family history includes Alzheimer's disease in her father; Hypertension in her mother; Throat cancer (age of onset: 14) in her sister. There is no history of Colon cancer, Rectal cancer, or Stomach cancer.     Objective:   Physical Exam   Well-developed African American female in no acute distress, pleasant accompanied by her husband. Blood pressure 130/80 pulse 75 height 5 foot 5 weight 220. HEENT nontraumatic normocephalic EOMI PERRLA sclera anicteric, Supple no JVD, Cardiovascular regular rate and rhythm with S1-S2 no murmur or gallop, Pulmonary clear bilaterally, Abdomen soft basically nontender there's no palpable mass or hepatosplenomegaly bowel sounds are present no guarding or rebound, Rectal exam not done, Extremities no clubbing cyanosis or edema skin warm and dry, Psych mood and affect normal and appropriate       Assessment & Plan:  #86 52 year old female with previous diagnosis of diffuse esophagitis esophageal spasm by records which has been responsive to balloon dilations and Botox injections with last procedure about 3 years ago. Patient now presents with 1 month history of recurrent chest pain dysphagia odynophagia and intermittent choking. Recent hospitalization at [facility] with negative cardiac workup. Symptoms are most consistent with diffuse esophageal spasm  #2 hypertension #3 diabetes mellitus #4 history of hepatitis C #5 osteoarthritis #6 probable NSAID-induced gastric ulcers on EGD done at [facility] 10 2014  Plan; patient will continue Prilosec 20 mg by mouth twice daily and advise her to avoid all aspirin and NSAIDs given recent diagnosis of an ulcer Discontinue Reglan Patient has been placed on Imdur were and will continue and has Nitrostat to use when necessary Will obtain copies of her  records from [facility]. Patient states that she had a manometry done there 2-3 years ago Schedule for EGD with probable balloon dilation and Botox injections with Dr. Deatra Ina. Oxycodone 5/325 #20 no refills for severe pain

## 2012-12-29 NOTE — Interval H&P Note (Signed)
History and Physical Interval Note:  12/29/2012 8:56 AM  [name] [name]  has presented today for surgery, with the diagnosis of Esophageal spasm [530.5]  The various methods of treatment have been discussed with the patient and family. After consideration of risks, benefits and other options for treatment, the patient has consented to  Procedure(s): ESOPHAGOGASTRODUODENOSCOPY (EGD) (N/A) BOTOX INJECTION (N/A) BALLOON DILATION (N/A) as a surgical intervention .  The patient's history has been reviewed, patient examined, no change in status, stable for surgery.  I have reviewed the patient's chart and labs.  Questions were answered to the patient's satisfaction.     The recent H&P (dated *12/12/12**) was reviewed, the patient was examined and there is no change in the patients condition since that H&P was completed.   Erskine Emery  12/29/2012, 8:56 AM   Erskine Emery

## 2012-12-29 NOTE — Op Note (Signed)
[facility] [place], [address]   ENDOSCOPY PROCEDURE REPORT  PATIENT: [name], [name]  MR#: [id number] BIRTHDATE: 02-06-1961 , 52  yrs. old GENDER: Female ENDOSCOPIST: Inda Castle, MD REFERRED BY:  [name], M.D. PROCEDURE DATE:  12/29/2012 PROCEDURE:  EGD w/ biopsy and EGD w/ Botox injection, balloon dilation ASA CLASS:     Class II INDICATIONS:  Chest pain.   Dysphagia. MEDICATIONS: MAC sedation, administered by CRNA TOPICAL ANESTHETIC:  DESCRIPTION OF PROCEDURE: After the risks benefits and alternatives of the procedure were thoroughly explained, informed consent was obtained.  The Pentax Gastroscope [id number] endoscope was introduced through the mouth and advanced to the third portion of the duodenum. Without limitations.  The instrument was slowly withdrawn as the mucosa was fully examined.      There was a mild narrowing at the GE junction.  The 9.8 mm gastroscope easily traversed the stricture. in the gastric antrum there multiple superficial erosions.  Biopsies were taken.   There was a mild narrowing at the GE junction.  The 9.8 mm gastroscope easily traversed the stricture. in the gastric antrum there multiple superficial erosions.  Biopsies were taken.   The remainder of the upper endoscopy exam was otherwise normal.  Retroflexed views revealed no abnormalities. a TTS balloon dilator was passed across the GE junction and inflated to 16.5 mm and 18 mm diameters for 30 seconds each.  There was mild resistance to the 18 mm balloon dilator.  There was no heme. Beginning at the [place] junction at 42 cm 1 cc (10 units) of Botox was injected submucosally everyone centimeter for a total of 10 injections corresponding to 10 cm length of the esophagus.   The scope was then withdrawn from the patient and the procedure completed.  COMPLICATIONS: There were no complications. ENDOSCOPIC IMPRESSION: 1.  early esophageal stricture-status  post balloon dilation 2.  status post Botox injection for esophageal spasm 3.  gastric erosions  RECOMMENDATIONS: 1.  Continue PPI therapy 2.  await biopsy results 3.  office visit 2-3 weeks REPEAT EXAM:  eSigned:  Inda Castle, MD 12/29/2012 9:57 AM   CC:  PATIENT NAME:  [name] [name], [name] [name] MR#: [id number]

## 2012-12-29 NOTE — Transfer of Care (Signed)
Immediate Anesthesia Transfer of Care Note  Patient: [name] [name]  Procedure(s) Performed: Procedure(s): ESOPHAGOGASTRODUODENOSCOPY (EGD) (N/A) BOTOX INJECTION BALLOON DILATION (N/A)  Patient Location: PACU  Anesthesia Type:MAC  Level of Consciousness: awake, alert , oriented and patient cooperative  Airway & Oxygen Therapy: Patient Spontanous Breathing and Patient connected to nasal cannula oxygen  Post-op Assessment: Report given to PACU RN, Post -op Vital signs reviewed and stable and Patient moving all extremities X 4  Post vital signs: stable  Complications: No apparent anesthesia complications

## 2012-12-29 NOTE — Anesthesia Postprocedure Evaluation (Signed)
Anesthesia Post Note  Patient: [name] [name]  Procedure(s) Performed: Procedure(s) (LRB): ESOPHAGOGASTRODUODENOSCOPY (EGD) (N/A) BOTOX INJECTION BALLOON DILATION (N/A)  Anesthesia type: MAC  Patient location: PACU  Post pain: Pain level controlled  Post assessment: Post-op Vital signs reviewed  Last Vitals:  Filed Vitals:   12/29/12 1115  BP: 151/88  Pulse:   Temp:   Resp: 15    Post vital signs: Reviewed  Level of consciousness: sedated  Complications: No apparent anesthesia complications

## 2012-12-30 ENCOUNTER — Encounter (HOSPITAL_COMMUNITY): Payer: Self-pay | Admitting: Gastroenterology

## 2013-01-06 ENCOUNTER — Encounter: Payer: Self-pay | Admitting: Gastroenterology

## 2013-01-12 ENCOUNTER — Telehealth: Payer: Self-pay | Admitting: Gastroenterology

## 2013-01-12 NOTE — Telephone Encounter (Signed)
Pts family doctor wrote her a note to stay out of work until her procedure with Dr. Deatra Ina. Discussed with pt that her family doctor should be the one to write her note to go back to work, Dr. Deatra Ina did not put her out of work. Pt verbalized understanding.

## 2013-01-23 ENCOUNTER — Encounter: Payer: Self-pay | Admitting: Gastroenterology

## 2013-01-23 ENCOUNTER — Ambulatory Visit (INDEPENDENT_AMBULATORY_CARE_PROVIDER_SITE_OTHER): Payer: PRIVATE HEALTH INSURANCE | Admitting: Gastroenterology

## 2013-01-23 VITALS — BP 138/84 | HR 68 | Ht 65.0 in | Wt 220.4 lb

## 2013-01-23 DIAGNOSIS — K224 Dyskinesia of esophagus: Secondary | ICD-10-CM

## 2013-01-23 DIAGNOSIS — K219 Gastro-esophageal reflux disease without esophagitis: Secondary | ICD-10-CM | POA: Insufficient documentation

## 2013-01-23 DIAGNOSIS — B182 Chronic viral hepatitis C: Secondary | ICD-10-CM

## 2013-01-23 DIAGNOSIS — K222 Esophageal obstruction: Secondary | ICD-10-CM

## 2013-01-23 NOTE — Assessment & Plan Note (Signed)
Not surprisingly patient has developed pyrosis following dilatation therapy despite taking Prilosec.  Recommendations #1 trial of dexilant 60 mg before breakfast and dinner for 2 weeks and then daily - patient will be given samples before prescribing the medicine

## 2013-01-23 NOTE — Assessment & Plan Note (Signed)
Dysphagia has subsided following dilatation therapy.  Plan repeat as needed

## 2013-01-23 NOTE — Progress Notes (Signed)
          History of Present Illness:  Mrs. Backes has returned following endoscopy with dilatation and Botox injection.  Both chest pain and dysphagia have entirely subsided.  Her main complaint is pyrosis.  She's taking Prilosec daily.   Review of Systems: Pertinent positive and negative review of systems were noted in the above HPI section. All other review of systems were otherwise negative.    Current Medications, Allergies, Past Medical History, Past Surgical History, Family History and Social History were reviewed in [facility] record  Vital signs were reviewed in today's medical record. Physical Exam: General: Well developed , well nourished, no acute distress

## 2013-01-23 NOTE — Assessment & Plan Note (Signed)
Patient has had an excellent response to therapy including Botox and balloon dilatation.  Chest pain has entirely subsided.  Recommendations #1 repeat Botox injection as needed

## 2013-01-23 NOTE — Patient Instructions (Addendum)
Take Dexilant twice a day for 3 weeks then take once daily Script will be sent to your pharmacy  Make sure you are following up with the Hep C clinic

## 2013-01-23 NOTE — Assessment & Plan Note (Signed)
Patient was instructed to followup with her hepatologist for new therapies for hepatitis A

## 2013-06-12 ENCOUNTER — Telehealth: Payer: Self-pay

## 2013-06-12 NOTE — Telephone Encounter (Signed)
Pt states she has started to have trouble swallowing again. Pt scheduled to see Dr. Deatra Ina 06/17/13@8 :30am. Pt aware of appt.

## 2013-06-17 ENCOUNTER — Ambulatory Visit: Payer: Managed Care, Other (non HMO) | Admitting: Gastroenterology

## 2013-06-18 ENCOUNTER — Encounter: Payer: Self-pay | Admitting: Physician Assistant

## 2013-06-18 ENCOUNTER — Encounter (HOSPITAL_COMMUNITY): Payer: Self-pay | Admitting: Pharmacy Technician

## 2013-06-18 ENCOUNTER — Ambulatory Visit (INDEPENDENT_AMBULATORY_CARE_PROVIDER_SITE_OTHER): Payer: Managed Care, Other (non HMO) | Admitting: Physician Assistant

## 2013-06-18 VITALS — BP 158/84 | HR 60 | Ht 65.0 in | Wt 227.4 lb

## 2013-06-18 DIAGNOSIS — R079 Chest pain, unspecified: Secondary | ICD-10-CM

## 2013-06-18 DIAGNOSIS — K219 Gastro-esophageal reflux disease without esophagitis: Secondary | ICD-10-CM

## 2013-06-18 DIAGNOSIS — R1319 Other dysphagia: Secondary | ICD-10-CM

## 2013-06-18 MED ORDER — DEXLANSOPRAZOLE 60 MG PO CPDR: 60.0000 mg | DELAYED_RELEASE_CAPSULE | Freq: Every day | ORAL | Status: DC

## 2013-06-18 MED ORDER — HYOSCYAMINE SULFATE 0.125 MG SL SUBL: 0.1250 mg | SUBLINGUAL_TABLET | SUBLINGUAL | Status: DC | PRN

## 2013-06-18 NOTE — Patient Instructions (Addendum)
Switch from prevacid  To Dexilant 60 mg once daily We sent a prescription to [facility] [place]  Try levsin sl  Dissolve on tongue 1/2 hours before meals to see if helps with spasm/pain We sent a prescription to your pharmacy, [place], [place].  Elevate Head of bed to prevent nighttime reflux, and stay upright after meals  You are scheduled for EGD and dilation/botox with Dr. Myrla Halsted have been scheduled for an endoscopy with propofol. Please follow written instructions given to you at your visit today. If you use inhalers (even only as needed), please bring them with you on the day of your procedure. Your physician has requested that you go to www.startemmi.com and enter the access code given to you at your visit today. This web site gives a general overview about your procedure. However, you should still follow specific instructions given to you by our office regarding your preparation for the procedure.   Thank you for allowing me to  Participate in your care, and Thanks for choosing [facility]  !

## 2013-06-18 NOTE — Progress Notes (Signed)
Reviewed and agree with management. Robbyn Hodkinson D. Allysia Ingles, M.D., FACG  

## 2013-06-18 NOTE — Progress Notes (Signed)
Subjective:    Patient ID: [name] [name], female    DOB: Jan 30, 1961, 53 y.o.   MRN: [id number]  HPI [name] [name] is a pleasant 53 year old Afro-American female known to Dr. Deatra Ina who has history of esophageal dyskinesia, esophageal stricture, chronic hepatitis C, adult onset diabetes mellitus, and hypertension. She has had difficulty with recurrent dysphagia, odynophagia, episodes of chest pain and choking for the past several years. She has undergone multiple EGDs and had a manometry and pH study done at [facility] in 2013. She has been responsive to esophageal dilations and Botox injections which provide complete relief of her symptoms for generally 6-7 months. She last had EGD done in November of 2014 was felt to have an early stricture which was balloon dilated and she also had Botox injections in 4 quadrants at the LES. She says she had complete relief of her symptoms up until about 2-3 weeks ago. Patient says she had eaten at [facility] and did fine while eating, but then afterwards developed chest pain tightness spasm and a lot of burning in her esophagus. She says she had some choking kind of feeling as well. She did take the nitroglycerin which she thinks helps a little bit but still had some discomfort and difficulty swallowing the rest of that day. She says ever since then she has had some intermittent recurrent choking episodes and feels as if her food is not going down. Complains of some burning in her esophagus as well she has not had any regurgitation or vomiting no complaints of abdominal pain. She has been using Prevacid 30 mg by mouth daily. ,   Review of Systems  Constitutional: Negative.   HENT: Positive for trouble swallowing.   Eyes: Negative.   Respiratory: Negative.   Cardiovascular: Positive for chest pain.  Gastrointestinal: Negative.   Endocrine: Negative.   Genitourinary: Negative.   Musculoskeletal: Negative.   Allergic/Immunologic: Negative.   Neurological: Negative.     Hematological: Negative.   Psychiatric/Behavioral: Negative.    Outpatient Prescriptions Prior to Visit  Medication Sig Dispense Refill  . glimepiride (AMARYL) 4 MG tablet Take 4 mg by mouth 2 (two) times daily.      Marland Kitchen lisinopril (PRINIVIL,ZESTRIL) 2.5 MG tablet Take 20 mg by mouth. 2 tablets once a day      . NITROSTAT 0.4 MG SL tablet USE AS NEEDED FOR CHEST PAIN AND CHOKING  30 each  2  . atorvastatin (LIPITOR) 40 MG tablet Take 40 mg by mouth daily.      . carvedilol (COREG) 25 MG tablet Take 25 mg by mouth 2 (two) times daily with a meal.      . isosorbide mononitrate (IMDUR) 30 MG 24 hr tablet Take 30 mg by mouth daily.      Marland Kitchen omeprazole (PRILOSEC) 40 MG capsule Take 40 mg by mouth 2 (two) times daily.      Marland Kitchen oxyCODONE-acetaminophen (PERCOCET/ROXICET) 5-325 MG per tablet Take 2 tablets by mouth as needed for pain.  20 tablet  0  . traMADol (ULTRAM) 50 MG tablet Take 1 tablet (50 mg total) by mouth every 6 (six) hours as needed.  30 tablet  0   No facility-administered medications prior to visit.   Allergies  Allergen Reactions  . Codeine Itching    No hives or rash, takes benadryl   Patient Active Problem List   Diagnosis Date Noted  . Esophageal reflux 01/23/2013  . Gastric erosions 12/29/2012  . Stricture and stenosis of esophagus 12/29/2012  . DIABETES  MELLITUS-TYPE II 09/05/2009  . CHEST PAIN 03/09/2009  . DYSPHAGIA 03/09/2009  . NAUSEA AND VOMITING 12/20/2008  . HEPATITIS C, CHRONIC 03/10/2008  . HYPERTENSION 03/10/2008  . BRONCHITIS, CHRONIC 03/10/2008  . Dyskinesia of esophagus 03/10/2008  . ARTHRITIS 03/10/2008   History  Substance Use Topics  . Smoking status: Never Smoker   . Smokeless tobacco: Never Used  . Alcohol Use: No   family history includes Alzheimer's disease in her father; Hypertension in her mother; Throat cancer (age of onset: 54) in her sister. There is no history of Colon cancer, Rectal cancer, or Stomach cancer.     Objective:    Physical Exam  well-developed African American female in no acute distress, pleasant blood pressure 158/84 pulse 60 height 5 foot 5 weight 227. HEENT; nontraumatic normocephalic EOMI PERRLA sclera anicteric, Supple; no JVD, Cardiovascular; regular rate and rhythm with S1-S2 no murmur or gallop, Pulmonary ;clear bilaterally, Abdomen; soft nontender nondistended bowel sounds are active there is no palpable mass or hepatosplenomegaly, Extremities; no clubbing cyanosis or edema skin warm and dry, Psych; mood and affect appropriate        Assessment & Plan:  #46  53 year old female with esophageal dyskinesia with recurrent esophageal spasms, chest pain and dysphagia as well as odynophagia. Symptoms responsive to balloon dilations and Botox injections of the LES. Patient presents today with recurrent symptoms x2 weeks #2 GERD poorly controlled #3 diabetes mellitus #4 hepatitis C #5 hypertension Plan; will change to Dexilant  60 mg by mouth every morning, samples given. Patient did have any increase in heartburn and reflux symptoms after her last Botox injection and this was discussed todayday, hopefully with Dexilant  this will be controlled, we also discussed elevation of the head of the bed n.p.o. for 2 hours before bedtime etc. Have scheduled for EGD with balloon dilation and Botox injections with Dr. Deatra Ina at [facility] . Procedure discussed with patient and she is agreeable to proceed. Have also given her a prescription for Levsin sublingual, she is asked to try this half an hour before meals to see if this improves her symptoms currently and then may use when necessary for episodes of spasm

## 2013-06-22 ENCOUNTER — Encounter (HOSPITAL_COMMUNITY): Payer: Self-pay | Admitting: *Deleted

## 2013-06-25 ENCOUNTER — Ambulatory Visit (HOSPITAL_COMMUNITY)
Admission: RE | Admit: 2013-06-25 | Discharge: 2013-06-25 | Disposition: A | Discharge status: Home / Self Care | Payer: 59 | Source: Ambulatory Visit | Attending: Gastroenterology | Admitting: Gastroenterology

## 2013-06-25 ENCOUNTER — Encounter (HOSPITAL_COMMUNITY): Payer: Self-pay | Admitting: *Deleted

## 2013-06-25 ENCOUNTER — Encounter (HOSPITAL_COMMUNITY): Payer: 59 | Admitting: Anesthesiology

## 2013-06-25 ENCOUNTER — Encounter (HOSPITAL_COMMUNITY)
Admission: RE | Disposition: A | Discharge status: Home / Self Care | Payer: Self-pay | Source: Ambulatory Visit | Attending: Gastroenterology

## 2013-06-25 ENCOUNTER — Ambulatory Visit (HOSPITAL_COMMUNITY): Payer: 59 | Admitting: Anesthesiology

## 2013-06-25 DIAGNOSIS — K219 Gastro-esophageal reflux disease without esophagitis: Secondary | ICD-10-CM | POA: Insufficient documentation

## 2013-06-25 DIAGNOSIS — I10 Essential (primary) hypertension: Secondary | ICD-10-CM | POA: Insufficient documentation

## 2013-06-25 DIAGNOSIS — R1319 Other dysphagia: Secondary | ICD-10-CM

## 2013-06-25 DIAGNOSIS — Z79899 Other long term (current) drug therapy: Secondary | ICD-10-CM | POA: Insufficient documentation

## 2013-06-25 DIAGNOSIS — E119 Type 2 diabetes mellitus without complications: Secondary | ICD-10-CM | POA: Insufficient documentation

## 2013-06-25 DIAGNOSIS — K224 Dyskinesia of esophagus: Secondary | ICD-10-CM | POA: Insufficient documentation

## 2013-06-25 DIAGNOSIS — B1920 Unspecified viral hepatitis C without hepatic coma: Secondary | ICD-10-CM | POA: Insufficient documentation

## 2013-06-25 DIAGNOSIS — R079 Chest pain, unspecified: Secondary | ICD-10-CM

## 2013-06-25 DIAGNOSIS — Z885 Allergy status to narcotic agent status: Secondary | ICD-10-CM | POA: Insufficient documentation

## 2013-06-25 DIAGNOSIS — R0789 Other chest pain: Secondary | ICD-10-CM | POA: Insufficient documentation

## 2013-06-25 DIAGNOSIS — B182 Chronic viral hepatitis C: Secondary | ICD-10-CM | POA: Insufficient documentation

## 2013-06-25 HISTORY — PX: BALLOON DILATION: SHX5330

## 2013-06-25 HISTORY — PX: ESOPHAGOGASTRODUODENOSCOPY: SHX5428

## 2013-06-25 HISTORY — PX: BOTOX INJECTION: SHX5754

## 2013-06-25 LAB — GLUCOSE, CAPILLARY
Glucose-Capillary: 379 mg/dL — ABNORMAL HIGH (ref 70–99)
Glucose-Capillary: 328 mg/dL — ABNORMAL HIGH (ref 70–99)

## 2013-06-25 SURGERY — EGD (ESOPHAGOGASTRODUODENOSCOPY)
Anesthesia: Monitor Anesthesia Care

## 2013-06-25 MED ORDER — SODIUM CHLORIDE 0.9 % IJ SOLN
INTRAMUSCULAR | Status: AC
  Filled 2013-06-25: qty 10

## 2013-06-25 MED ORDER — PROPOFOL INFUSION 10 MG/ML OPTIME
INTRAVENOUS | Status: DC | PRN
  Administered 2013-06-25: 90 ug/kg/min via INTRAVENOUS

## 2013-06-25 MED ORDER — MIDAZOLAM HCL 5 MG/5ML IJ SOLN
INTRAMUSCULAR | Status: DC | PRN
Start: 2013-06-25 — End: 2013-06-25
  Administered 2013-06-25: 2 mg via INTRAVENOUS

## 2013-06-25 MED ORDER — ONABOTULINUMTOXINA 100 UNITS IJ SOLR
INTRAMUSCULAR | Status: DC | PRN
  Administered 2013-06-25: 100 [IU] via INTRAMUSCULAR

## 2013-06-25 MED ORDER — LACTATED RINGERS IV SOLN
INTRAVENOUS | Status: DC
  Administered 2013-06-25: 1000 mL via INTRAVENOUS

## 2013-06-25 MED ORDER — SODIUM CHLORIDE 0.9 % IV SOLN: INTRAVENOUS | Status: DC

## 2013-06-25 MED ORDER — PROPOFOL 10 MG/ML IV BOLUS
INTRAVENOUS | Status: AC
  Filled 2013-06-25: qty 20

## 2013-06-25 MED ORDER — MIDAZOLAM HCL 2 MG/2ML IJ SOLN
INTRAMUSCULAR | Status: AC
  Filled 2013-06-25: qty 2

## 2013-06-25 MED ORDER — KETAMINE HCL 10 MG/ML IJ SOLN
INTRAMUSCULAR | Status: DC | PRN
  Administered 2013-06-25: 20 mg via INTRAVENOUS

## 2013-06-25 MED ORDER — ONABOTULINUMTOXINA 100 UNITS IJ SOLR
100.0000 [IU] | Freq: Once | INTRAMUSCULAR | Status: DC
  Filled 2013-06-25: qty 100

## 2013-06-25 NOTE — H&P (View-Only) (Signed)
Subjective:    Patient ID: [name] [name], female    DOB: 1960/02/14, 53 y.o.   MRN: [id number]  HPI Bao is a pleasant 53 year old Afro-American female known to Dr. Deatra Ina who has history of esophageal dyskinesia, esophageal stricture, chronic hepatitis C, adult onset diabetes mellitus, and hypertension. She has had difficulty with recurrent dysphagia, odynophagia, episodes of chest pain and choking for the past several years. She has undergone multiple EGDs and had a manometry and pH study done at [facility] in 2013. She has been responsive to esophageal dilations and Botox injections which provide complete relief of her symptoms for generally 6-7 months. She last had EGD done in November of 2014 was felt to have an early stricture which was balloon dilated and she also had Botox injections in 4 quadrants at the LES. She says she had complete relief of her symptoms up until about 2-3 weeks ago. Patient says she had eaten at [facility] and did fine while eating, but then afterwards developed chest pain tightness spasm and a lot of burning in her esophagus. She says she had some choking kind of feeling as well. She did take the nitroglycerin which she thinks helps a little bit but still had some discomfort and difficulty swallowing the rest of that day. She says ever since then she has had some intermittent recurrent choking episodes and feels as if her food is not going down. Complains of some burning in her esophagus as well she has not had any regurgitation or vomiting no complaints of abdominal pain. She has been using Prevacid 30 mg by mouth daily. ,   Review of Systems  Constitutional: Negative.   HENT: Positive for trouble swallowing.   Eyes: Negative.   Respiratory: Negative.   Cardiovascular: Positive for chest pain.  Gastrointestinal: Negative.   Endocrine: Negative.   Genitourinary: Negative.   Musculoskeletal: Negative.   Allergic/Immunologic: Negative.   Neurological: Negative.     Hematological: Negative.   Psychiatric/Behavioral: Negative.    Outpatient Prescriptions Prior to Visit  Medication Sig Dispense Refill  . glimepiride (AMARYL) 4 MG tablet Take 4 mg by mouth 2 (two) times daily.      Marland Kitchen lisinopril (PRINIVIL,ZESTRIL) 2.5 MG tablet Take 20 mg by mouth. 2 tablets once a day      . NITROSTAT 0.4 MG SL tablet USE AS NEEDED FOR CHEST PAIN AND CHOKING  30 each  2  . atorvastatin (LIPITOR) 40 MG tablet Take 40 mg by mouth daily.      . carvedilol (COREG) 25 MG tablet Take 25 mg by mouth 2 (two) times daily with a meal.      . isosorbide mononitrate (IMDUR) 30 MG 24 hr tablet Take 30 mg by mouth daily.      Marland Kitchen omeprazole (PRILOSEC) 40 MG capsule Take 40 mg by mouth 2 (two) times daily.      Marland Kitchen oxyCODONE-acetaminophen (PERCOCET/ROXICET) 5-325 MG per tablet Take 2 tablets by mouth as needed for pain.  20 tablet  0  . traMADol (ULTRAM) 50 MG tablet Take 1 tablet (50 mg total) by mouth every 6 (six) hours as needed.  30 tablet  0   No facility-administered medications prior to visit.   Allergies  Allergen Reactions  . Codeine Itching    No hives or rash, takes benadryl   Patient Active Problem List   Diagnosis Date Noted  . Esophageal reflux 01/23/2013  . Gastric erosions 12/29/2012  . Stricture and stenosis of esophagus 12/29/2012  . DIABETES  MELLITUS-TYPE II 09/05/2009  . CHEST PAIN 03/09/2009  . DYSPHAGIA 03/09/2009  . NAUSEA AND VOMITING 12/20/2008  . HEPATITIS C, CHRONIC 03/10/2008  . HYPERTENSION 03/10/2008  . BRONCHITIS, CHRONIC 03/10/2008  . Dyskinesia of esophagus 03/10/2008  . ARTHRITIS 03/10/2008   History  Substance Use Topics  . Smoking status: Never Smoker   . Smokeless tobacco: Never Used  . Alcohol Use: No   family history includes Alzheimer's disease in her father; Hypertension in her mother; Throat cancer (age of onset: 60) in her sister. There is no history of Colon cancer, Rectal cancer, or Stomach cancer.     Objective:    Physical Exam  well-developed African American female in no acute distress, pleasant blood pressure 158/84 pulse 60 height 5 foot 5 weight 227. HEENT; nontraumatic normocephalic EOMI PERRLA sclera anicteric, Supple; no JVD, Cardiovascular; regular rate and rhythm with S1-S2 no murmur or gallop, Pulmonary ;clear bilaterally, Abdomen; soft nontender nondistended bowel sounds are active there is no palpable mass or hepatosplenomegaly, Extremities; no clubbing cyanosis or edema skin warm and dry, Psych; mood and affect appropriate        Assessment & Plan:  #71  53 year old female with esophageal dyskinesia with recurrent esophageal spasms, chest pain and dysphagia as well as odynophagia. Symptoms responsive to balloon dilations and Botox injections of the LES. Patient presents today with recurrent symptoms x2 weeks #2 GERD poorly controlled #3 diabetes mellitus #4 hepatitis C #5 hypertension Plan; will change to Dexilant  60 mg by mouth every morning, samples given. Patient did have any increase in heartburn and reflux symptoms after her last Botox injection and this was discussed todayday, hopefully with Dexilant  this will be controlled, we also discussed elevation of the head of the bed n.p.o. for 2 hours before bedtime etc. Have scheduled for EGD with balloon dilation and Botox injections with Dr. Deatra Ina at [facility] . Procedure discussed with patient and she is agreeable to proceed. Have also given her a prescription for Levsin sublingual, she is asked to try this half an hour before meals to see if this improves her symptoms currently and then may use when necessary for episodes of spasm

## 2013-06-25 NOTE — Transfer of Care (Signed)
Immediate Anesthesia Transfer of Care Note  Patient: [name] [name]  Procedure(s) Performed: Procedure(s): ESOPHAGOGASTRODUODENOSCOPY (EGD) (N/A) BALLOON DILATION (N/A) BOTOX INJECTION (N/A)  Patient Location: PACU  Anesthesia Type:MAC  Level of Consciousness: sedated  Airway & Oxygen Therapy: Patient Spontanous Breathing and Patient connected to nasal cannula oxygen  Post-op Assessment: Report given to PACU RN and Post -op Vital signs reviewed and stable  Post vital signs: Reviewed and stable  Complications: No apparent anesthesia complications

## 2013-06-25 NOTE — Anesthesia Preprocedure Evaluation (Signed)
Anesthesia Evaluation  Patient identified by MRN, date of birth, ID band Patient awake    Reviewed: Allergy & Precautions, H&P , NPO status , Patient's Chart, lab work & pertinent test results  Airway Mallampati: II TM Distance: >3 FB     Dental  (+) Dental Advisory Given   Pulmonary neg pulmonary ROS,  breath sounds clear to auscultation        Cardiovascular hypertension, Pt. on medications Rhythm:Regular Rate:Normal     Neuro/Psych negative neurological ROS  negative psych ROS   GI/Hepatic GERD-  ,(+) Hepatitis -, C  Endo/Other  diabetes, Poorly Controlled, Type 2, Oral Hypoglycemic Agents  Renal/GU negative Renal ROS     Musculoskeletal negative musculoskeletal ROS (+)   Abdominal   Peds  Hematology negative hematology ROS (+)   Anesthesia Other Findings   Reproductive/Obstetrics negative OB ROS                           Anesthesia Physical  Anesthesia Plan  ASA: III  Anesthesia Plan: MAC   Post-op Pain Management:    Induction: Intravenous  Airway Management Planned: Nasal Cannula  Additional Equipment:   Intra-op Plan:   Post-operative Plan:   Informed Consent: I have reviewed the patients History and Physical, chart, labs and discussed the procedure including the risks, benefits and alternatives for the proposed anesthesia with the patient or authorized representative who has indicated his/her understanding and acceptance.   Dental advisory given  Plan Discussed with: CRNA  Anesthesia Plan Comments:         Anesthesia Quick Evaluation

## 2013-06-25 NOTE — Anesthesia Postprocedure Evaluation (Signed)
Anesthesia Post Note  Patient: Charlesa E Beezley  Procedure(s) Performed: Procedure(s) (LRB): ESOPHAGOGASTRODUODENOSCOPY (EGD) (N/A) BALLOON DILATION (N/A) BOTOX INJECTION (N/A)  Anesthesia type: MAC  Patient location: PACU  Post pain: Pain level controlled  Post assessment: Post-op Vital signs reviewed  Last Vitals: BP 211/99  Pulse 58  Temp(Src) 36.7 C (Oral)  Resp 15  SpO2 98%  Post vital signs: Reviewed  Level of consciousness: awake  Complications: No apparent anesthesia complications

## 2013-06-25 NOTE — Interval H&P Note (Signed)
History and Physical Interval Note:  06/25/2013 1:09 PM  [name] [name]  has presented today for surgery, with the diagnosis of Chest pain with esophageal spasms  The various methods of treatment have been discussed with the patient and family. After consideration of risks, benefits and other options for treatment, the patient has consented to  Procedure(s): ESOPHAGOGASTRODUODENOSCOPY (EGD) (N/A) BALLOON DILATION (N/A) BOTOX INJECTION (N/A) as a surgical intervention .  The patient's history has been reviewed, patient examined, no change in status, stable for surgery.  I have reviewed the patient's chart and labs.  Questions were answered to the patient's satisfaction.    The recent H&P (dated *06/18/13**) was reviewed, the patient was examined and there is no change in the patients condition since that H&P was completed.   Inda Castle  06/25/2013, 1:09 PM    Inda Castle

## 2013-06-25 NOTE — Discharge Instructions (Signed)
Gastrointestinal Endoscopy °Care After °Refer to this sheet in the next few weeks. These instructions provide you with information on caring for yourself after your procedure. Your caregiver may also give you more specific instructions. Your treatment has been planned according to current medical practices, but problems sometimes occur. Call your caregiver if you have any problems or questions after your procedure. °HOME CARE INSTRUCTIONS °· If you were given medicine to help you relax (sedative), do not drive, operate machinery, or sign important documents for 24 hours. °· Avoid alcohol and hot or warm beverages for the first 24 hours after the procedure. °· Only take over-the-counter or prescription medicines for pain, discomfort, or fever as directed by your caregiver. You may resume taking your normal medicines unless your caregiver tells you otherwise. Ask your caregiver when you may resume taking medicines that may cause bleeding, such as aspirin, clopidogrel, or warfarin. °· You may return to your normal diet and activities on the day after your procedure, or as directed by your caregiver. Walking may help to reduce any bloated feeling in your abdomen. °· Drink enough fluids to keep your urine clear or pale yellow. °· You may gargle with salt water if you have a sore throat. °SEEK IMMEDIATE MEDICAL CARE IF: °· You have severe nausea or vomiting. °· You have severe abdominal pain, abdominal cramps that last longer than 6 hours, or abdominal swelling (distention). °· You have severe shoulder or back pain. °· You have trouble swallowing. °· You have shortness of breath, your breathing is shallow, or you are breathing faster than normal. °· You have a fever or a rapid heartbeat. °· You vomit blood or material that looks like coffee grounds. °· You have bloody, black, or tarry stools. °MAKE SURE YOU: °· Understand these instructions. °· Will watch your condition. °· Will get help right away if you are not doing  well or get worse. °Document Released: 09/13/2003 Document Revised: 07/31/2011 Document Reviewed: 05/01/2011 °ExitCare® Patient Information ©2014 [employer]. ° °

## 2013-06-25 NOTE — Op Note (Signed)
[facility] [place], [address]   ENDOSCOPY PROCEDURE REPORT  PATIENT: Hesta, Adamson  MR#: [id number] BIRTHDATE: Sep 26, 1960 , 53  yrs. old GENDER: Female ENDOSCOPIST: Inda Castle, MD ASSISTANT:   Mariana Single, RN Corliss Parish, technician REFERRED BY: PROCEDURE DATE:  06/25/2013 PROCEDURE:   EGD with balloon dilatation , botox injection ASA CLASS:   Class II INDICATIONS:chest pain. MEDICATIONS: MAC sedation, administered by CRNA TOPICAL ANESTHETIC:  DESCRIPTION OF PROCEDURE:   After the risks benefits and alternatives of the procedure were thoroughly explained, informed consent was obtained.  The     endoscope was introduced through the mouth  and advanced to the third portion of the duodenum ,      The instrument was slowly withdrawn as the mucosa was carefully examined.    Upper endoscopy exam was  normal including a retroflexed view. Dilation was then performed at the gastroesphageal junction  Dilator:Balloon Size:50mm  Reststance:minimal Heme:none . Following this 25 units (1 cc) of Botox was injected into each quadrant at the GE junction.  COMPLICATIONS: There were no complications. ENDOSCOPIC IMPRESSION: chest pain secondary to spasm at the LES-status post balloon dilation and Botox injection  RECOMMENDATIONS: Repeat dilation and Botox as needed  eSigned:  Inda Castle, MD 06/25/2013 2:28 PM  CC:

## 2013-06-26 ENCOUNTER — Telehealth: Payer: Self-pay | Admitting: Gastroenterology

## 2013-06-26 ENCOUNTER — Encounter (HOSPITAL_COMMUNITY): Payer: Self-pay | Admitting: Gastroenterology

## 2013-06-26 NOTE — Telephone Encounter (Signed)
Spoke with patient and she is hoarse and having chest pain. She can swallow without difficulty. Spoke with Dr. Deatra Ina and he suggests patient drink warm liquids such as tea. Patient notified.

## 2013-07-01 NOTE — Progress Notes (Signed)
During post-op phone call, pt reported hoarseness and "loosing my voice" that comes and goes. She also stated she was having chest pain since her procedure. Patient states that she has notified Dr. Kelby Fam office and MD recommended her to drink "salt water" and that she has been doing so since this Saturday. Otherwise, no other symptoms reported. Advised patient to keep a watch on her symptoms and if they get worse or do not go away by this Saturday, she should call MD's office back or get to ER. Patient verbalized understanding.

## 2013-07-15 ENCOUNTER — Telehealth: Payer: Self-pay | Admitting: Gastroenterology

## 2013-07-15 NOTE — Telephone Encounter (Signed)
Left message for pt to call back.  Pt scheduled to see Alonza Bogus PA 07/17/13@2pm . PCP office to notify pt of appt and send records.

## 2013-07-17 ENCOUNTER — Ambulatory Visit (INDEPENDENT_AMBULATORY_CARE_PROVIDER_SITE_OTHER): Payer: 59 | Admitting: Gastroenterology

## 2013-07-17 ENCOUNTER — Other Ambulatory Visit: Payer: 59

## 2013-07-17 ENCOUNTER — Encounter: Payer: Self-pay | Admitting: Gastroenterology

## 2013-07-17 VITALS — BP 150/98 | HR 64 | Ht 65.0 in | Wt 225.4 lb

## 2013-07-17 DIAGNOSIS — R109 Unspecified abdominal pain: Secondary | ICD-10-CM

## 2013-07-17 DIAGNOSIS — R197 Diarrhea, unspecified: Secondary | ICD-10-CM

## 2013-07-17 DIAGNOSIS — K219 Gastro-esophageal reflux disease without esophagitis: Secondary | ICD-10-CM

## 2013-07-17 HISTORY — DX: Unspecified abdominal pain: R10.9

## 2013-07-17 HISTORY — DX: Diarrhea, unspecified: R19.7

## 2013-07-17 MED ORDER — DEXLANSOPRAZOLE 60 MG PO CPDR: 60.0000 mg | DELAYED_RELEASE_CAPSULE | Freq: Every day | ORAL | Status: DC

## 2013-07-17 MED ORDER — HYOSCYAMINE SULFATE 0.125 MG SL SUBL: 0.1250 mg | SUBLINGUAL_TABLET | SUBLINGUAL | Status: DC | PRN

## 2013-07-17 NOTE — Addendum Note (Signed)
Addended by: Hope Pigeon A on: 07/17/2013 03:42 PM   Modules accepted: Orders

## 2013-07-17 NOTE — Patient Instructions (Signed)
Samples of Dexilant given today. Prescription of Dexilant was sent to your pharmacy. Please take one capsule by mouth twice daily.  Your physician has requested that you go to the basement for the following lab work before leaving today: GI Pathogen Panel   Refill of Levsin was sent to your pharmacy, please place under your tongue twice daily.

## 2013-07-17 NOTE — Progress Notes (Signed)
07/17/2013 [name] [name] [id number] 1960-10-14   History of Present Illness:  [name] [name] is a pleasant 53 year old African-American female known to Dr. Deatra Ina who has history of esophageal dyskinesia, esophageal stricture, chronic hepatitis C, adult onset diabetes mellitus, and hypertension.  She has had difficulty with recurrent dysphagia, odynophagia, episodes of chest pain and choking for the past several years. She has undergone multiple EGDs and had a manometry and pH study done at [facility] in 2013. She has been responsive to esophageal dilations and Botox injections which provide complete relief of her symptoms for generally 6-7 months. She had EGD done in November of 2014 was felt to have an early stricture which was balloon dilated and she also had Botox injections in 4 quadrants at the LES. She says she had complete relief of her symptoms up until about 2-3 weeks ago.  She underwent EGD by Dr. Deatra Ina on 5/14 at which time he dilated her LES and injected Botox due to esophageal spasm at that area.  She was previously taking prevacid 30 mg daily, but was switched to Dexilant 60 mg daily at her visit here in early May prior to the EGD.  She says that she is taking the Dexilant every day but does not think that it is helping much with her upper abdominal burning.  She also complains of diarrhea that has been present for 3-4 days.  Says that she is having 5-6 episodes of diarrhea each day and wakes up at night to have a BM as well.  Denies blood in the stool.  Denies antibiotic use, recent travel, or sick contacts.    She also complains of lower abdominal pain that she said started between her 06/18/13 office visit and her EGD on 06/25/13.  She was given Levsin to use prior to eating and prn for her esophageal spasm/UGI issues actually.  She has only been taking it once a day, however, and has been swallowing the medication even though it is SL formulation.    She says that her PCP performed labs (we  do not have these results and their office is closed until Monday) and told her that there was something wrong with her small intestine and that she need to see GI for that issue.   Current Medications, Allergies, Past Medical History, Past Surgical History, Family History and Social History were reviewed in [employer] record.   Physical Exam: BP 150/98  Pulse 64  Ht 5\' 5"  (1.651 m)  Wt 225 lb 6.4 oz (102.241 kg)  BMI 37.51 kg/m2 General: Well developed black female in no acute distress Head: Normocephalic and atraumatic Eyes:  Sclerae anicteric, conjunctiva pink  Ears: Normal auditory acuity Lungs: Clear throughout to auscultation Heart: Regular rate and rhythm Abdomen: Soft, non-distended.  Normal bowel sounds.  Very minimal discomfort upon palpation in the lower abdomen. Musculoskeletal: Symmetrical with no gross deformities  Extremities: No edema  Neurological: Alert oriented x 4, grossly non-focal Psychological:  Alert and cooperative. Normal mood and affect  Assessment and Recommendations: -Diarrhea:  Acute, only present 3 or 4 days.  Will check GI stool pathogen panel.  ? Infectious or could this be side effect of Dexilant. -Lower abdominal pain:  Abdominal exam benign.  She has only been using the levsin once daily and has been swallowing it even though it is SL.  I have asked her to place it under her tongue 2-3 times daily. -GERD:  Does not think that Dexilant is helping.  Will increase to twice a day for a couple of weeks until we see if it calms down then could go back to once a day (will send prescription for once daily and will give samples to use for the second dose).  *Will obtain and review lab results from her PCP on Monday when their office re-opens.

## 2013-07-20 ENCOUNTER — Telehealth: Payer: Self-pay | Admitting: *Deleted

## 2013-07-20 NOTE — Progress Notes (Signed)
Reviewed and agree with management.  It is difficult to evaluate this patient because she has so many somatic complaints so often.  If she's not improved with high dose dexilant I would switch her to a less expensive PPI the Robert D. Deatra Ina, M.D., [facility]

## 2013-07-20 NOTE — Telephone Encounter (Signed)
Called Patty @ Dr. Spero Curb [phone], per Linda's note on 07-15-2013 [facility] for call back. Called today 07-20-2013 [facility] for call back regarding patient's lab work that was suppose to be faxed last week before appointment with Alonza Bogus. Per Genella Mech, CMA she received records Friday 07-17-2013 and sent them down to be scanned in to chart.  Called back at 1020 am was told they where short staffed and Chong Sicilian is off today. Advised labs will be faxed in one or two hours today. Gave fax number [phone].

## 2013-07-21 ENCOUNTER — Other Ambulatory Visit: Payer: 59

## 2013-07-21 DIAGNOSIS — R109 Unspecified abdominal pain: Secondary | ICD-10-CM

## 2013-07-21 DIAGNOSIS — R197 Diarrhea, unspecified: Secondary | ICD-10-CM

## 2013-07-22 LAB — GASTROINTESTINAL PATHOGEN PANEL PCR
C. difficile Tox A/B, PCR: NEGATIVE
CRYPTOSPORIDIUM, PCR: NEGATIVE
Campylobacter, PCR: NEGATIVE
E coli (ETEC) LT/ST PCR: NEGATIVE
E coli (STEC) stx1/stx2, PCR: NEGATIVE
E coli 0157, PCR: NEGATIVE
Giardia lamblia, PCR: NEGATIVE
NOROVIRUS, PCR: NEGATIVE
Rotavirus A, PCR: NEGATIVE
SHIGELLA, PCR: NEGATIVE
Salmonella, PCR: NEGATIVE

## 2013-07-24 ENCOUNTER — Other Ambulatory Visit: Payer: Self-pay | Admitting: *Deleted

## 2013-07-24 MED ORDER — METRONIDAZOLE 500 MG PO TABS: ORAL_TABLET | ORAL | Status: DC

## 2013-08-18 ENCOUNTER — Telehealth: Payer: Self-pay | Admitting: Physician Assistant

## 2013-08-18 NOTE — Telephone Encounter (Signed)
Spoke with patient and she states she had chest pain yesterday. States she is taking Dexilant and Levsin without relief. States yesterday, she took nitroglycerin x 3, used a pain patch and Aleve. None of these helped. She has not called her cardiologist because she states it is always GI. She reports she tried to eat today and it burns. Please, advise.

## 2013-08-19 MED ORDER — SUCRALFATE 1 GM/10ML PO SUSP: 1.0000 g | Freq: Three times a day (TID) | ORAL | Status: DC

## 2013-08-19 NOTE — Telephone Encounter (Signed)
Spoke with patient and gave her recommendations. She states she saw her cardiologist yesterday. Rx sent for Carafate suspension(chest pain). Scheduled with Dr. Deatra Ina for [place].

## 2013-08-19 NOTE — Telephone Encounter (Signed)
Left a message for patient to call back. 

## 2013-08-19 NOTE — Telephone Encounter (Signed)
Have her try some carafate 4 times per day (suspension if pain is in chest or tablet if pain is in epigastrium).  I would still like her to see her PCP or cardiologist.  Also, make her an appt to see Dr. Deatra Ina at some point as well for follow-up.  Thank you,  Jess

## 2013-08-27 ENCOUNTER — Encounter (HOSPITAL_COMMUNITY): Payer: Self-pay | Admitting: Emergency Medicine

## 2013-08-27 ENCOUNTER — Inpatient Hospital Stay (HOSPITAL_COMMUNITY)
Admission: EM | Admit: 2013-08-27 | Discharge: 2013-08-30 | DRG: 305 | Disposition: A | Discharge status: Home / Self Care | Payer: 59 | Attending: Internal Medicine | Admitting: Internal Medicine

## 2013-08-27 ENCOUNTER — Emergency Department (HOSPITAL_COMMUNITY): Payer: 59

## 2013-08-27 DIAGNOSIS — G8929 Other chronic pain: Secondary | ICD-10-CM | POA: Diagnosis present

## 2013-08-27 DIAGNOSIS — R0789 Other chest pain: Secondary | ICD-10-CM

## 2013-08-27 DIAGNOSIS — I10 Essential (primary) hypertension: Principal | ICD-10-CM | POA: Diagnosis present

## 2013-08-27 DIAGNOSIS — E1149 Type 2 diabetes mellitus with other diabetic neurological complication: Secondary | ICD-10-CM | POA: Diagnosis present

## 2013-08-27 DIAGNOSIS — F3289 Other specified depressive episodes: Secondary | ICD-10-CM | POA: Diagnosis present

## 2013-08-27 DIAGNOSIS — F411 Generalized anxiety disorder: Secondary | ICD-10-CM | POA: Diagnosis present

## 2013-08-27 DIAGNOSIS — R9431 Abnormal electrocardiogram [ECG] [EKG]: Secondary | ICD-10-CM

## 2013-08-27 DIAGNOSIS — R1319 Other dysphagia: Secondary | ICD-10-CM

## 2013-08-27 DIAGNOSIS — R109 Unspecified abdominal pain: Secondary | ICD-10-CM

## 2013-08-27 DIAGNOSIS — R519 Headache, unspecified: Secondary | ICD-10-CM

## 2013-08-27 DIAGNOSIS — R079 Chest pain, unspecified: Secondary | ICD-10-CM | POA: Diagnosis present

## 2013-08-27 DIAGNOSIS — M549 Dorsalgia, unspecified: Secondary | ICD-10-CM | POA: Diagnosis present

## 2013-08-27 DIAGNOSIS — J42 Unspecified chronic bronchitis: Secondary | ICD-10-CM

## 2013-08-27 DIAGNOSIS — R51 Headache: Secondary | ICD-10-CM

## 2013-08-27 DIAGNOSIS — M129 Arthropathy, unspecified: Secondary | ICD-10-CM

## 2013-08-27 DIAGNOSIS — F329 Major depressive disorder, single episode, unspecified: Secondary | ICD-10-CM | POA: Diagnosis present

## 2013-08-27 DIAGNOSIS — K219 Gastro-esophageal reflux disease without esophagitis: Secondary | ICD-10-CM | POA: Diagnosis present

## 2013-08-27 DIAGNOSIS — K222 Esophageal obstruction: Secondary | ICD-10-CM

## 2013-08-27 DIAGNOSIS — Z23 Encounter for immunization: Secondary | ICD-10-CM

## 2013-08-27 DIAGNOSIS — K224 Dyskinesia of esophagus: Secondary | ICD-10-CM | POA: Diagnosis present

## 2013-08-27 DIAGNOSIS — E119 Type 2 diabetes mellitus without complications: Secondary | ICD-10-CM

## 2013-08-27 DIAGNOSIS — Z7982 Long term (current) use of aspirin: Secondary | ICD-10-CM

## 2013-08-27 DIAGNOSIS — I160 Hypertensive urgency: Secondary | ICD-10-CM

## 2013-08-27 DIAGNOSIS — R197 Diarrhea, unspecified: Secondary | ICD-10-CM

## 2013-08-27 DIAGNOSIS — Z79899 Other long term (current) drug therapy: Secondary | ICD-10-CM

## 2013-08-27 DIAGNOSIS — B182 Chronic viral hepatitis C: Secondary | ICD-10-CM | POA: Diagnosis present

## 2013-08-27 DIAGNOSIS — E7800 Pure hypercholesterolemia, unspecified: Secondary | ICD-10-CM

## 2013-08-27 HISTORY — DX: Type 2 diabetes mellitus without complications: E11.9

## 2013-08-27 HISTORY — DX: Other chronic pain: G89.29

## 2013-08-27 HISTORY — DX: Low back pain: M54.5

## 2013-08-27 HISTORY — DX: Anxiety disorder, unspecified: F41.9

## 2013-08-27 HISTORY — DX: Low back pain, unspecified: M54.50

## 2013-08-27 HISTORY — DX: Pure hypercholesterolemia, unspecified: E78.00

## 2013-08-27 HISTORY — DX: Major depressive disorder, single episode, unspecified: F32.9

## 2013-08-27 HISTORY — DX: Depression, unspecified: F32.A

## 2013-08-27 HISTORY — DX: Pneumonia, unspecified organism: J18.9

## 2013-08-27 HISTORY — DX: Migraine, unspecified, not intractable, without status migrainosus: G43.909

## 2013-08-27 HISTORY — DX: Urinary tract infection, site not specified: N39.0

## 2013-08-27 LAB — COMPREHENSIVE METABOLIC PANEL
ALT: 32 U/L (ref 0–35)
ANION GAP: 17 — AB (ref 5–15)
AST: 30 U/L (ref 0–37)
Albumin: 3.4 g/dL — ABNORMAL LOW (ref 3.5–5.2)
Alkaline Phosphatase: 150 U/L — ABNORMAL HIGH (ref 39–117)
BUN: 13 mg/dL (ref 6–23)
CALCIUM: 8.8 mg/dL (ref 8.4–10.5)
CO2: 23 mEq/L (ref 19–32)
Chloride: 96 mEq/L (ref 96–112)
Creatinine, Ser: 0.98 mg/dL (ref 0.50–1.10)
GFR calc non Af Amer: 65 mL/min — ABNORMAL LOW (ref >90)
GFR, EST AFRICAN AMERICAN: 76 mL/min — AB (ref >90)
GLUCOSE: 399 mg/dL — AB (ref 70–99)
Potassium: 4.3 mEq/L (ref 3.7–5.3)
Sodium: 136 mEq/L — ABNORMAL LOW (ref 137–147)
Total Bilirubin: 0.3 mg/dL (ref 0.3–1.2)
Total Protein: 6.6 g/dL (ref 6.0–8.3)

## 2013-08-27 LAB — CBC WITH DIFFERENTIAL/PLATELET
Basophils Absolute: 0 10*3/uL (ref 0.0–0.1)
Basophils Relative: 0 % (ref 0–1)
EOS ABS: 0.2 10*3/uL (ref 0.0–0.7)
EOS PCT: 2 % (ref 0–5)
HCT: 34.5 % — ABNORMAL LOW (ref 36.0–46.0)
HEMOGLOBIN: 12.3 g/dL (ref 12.0–15.0)
LYMPHS ABS: 3.2 10*3/uL (ref 0.7–4.0)
Lymphocytes Relative: 39 % (ref 12–46)
MCH: 27.5 pg (ref 26.0–34.0)
MCHC: 35.7 g/dL (ref 30.0–36.0)
MCV: 77.2 fL — AB (ref 78.0–100.0)
MONOS PCT: 6 % (ref 3–12)
Monocytes Absolute: 0.5 10*3/uL (ref 0.1–1.0)
Neutro Abs: 4.3 10*3/uL (ref 1.7–7.7)
Neutrophils Relative %: 53 % (ref 43–77)
PLATELETS: 185 10*3/uL (ref 150–400)
RBC: 4.47 MIL/uL (ref 3.87–5.11)
RDW: 13.3 % (ref 11.5–15.5)
WBC: 8.2 10*3/uL (ref 4.0–10.5)

## 2013-08-27 LAB — I-STAT TROPONIN, ED: Troponin i, poc: 0 ng/mL (ref 0.00–0.08)

## 2013-08-27 MED ORDER — NITROGLYCERIN 0.4 MG SL SUBL
0.4000 mg | SUBLINGUAL_TABLET | SUBLINGUAL | Status: DC | PRN
Start: 2013-08-27 — End: 2013-08-30
  Administered 2013-08-28: 0.4 mg via SUBLINGUAL
  Filled 2013-08-27: qty 1

## 2013-08-27 MED ORDER — HYDROMORPHONE HCL PF 1 MG/ML IJ SOLN
1.0000 mg | Freq: Once | INTRAMUSCULAR | Status: AC
  Administered 2013-08-28: 1 mg via INTRAVENOUS
  Filled 2013-08-27: qty 1

## 2013-08-27 MED ORDER — MORPHINE SULFATE 4 MG/ML IJ SOLN
4.0000 mg | Freq: Once | INTRAMUSCULAR | Status: AC
  Administered 2013-08-27: 4 mg via INTRAVENOUS
  Filled 2013-08-27: qty 1

## 2013-08-27 MED ORDER — SODIUM CHLORIDE 0.9 % IV BOLUS (SEPSIS)
1000.0000 mL | Freq: Once | INTRAVENOUS | Status: AC
  Administered 2013-08-27: 1000 mL via INTRAVENOUS

## 2013-08-27 MED ORDER — INSULIN ASPART 100 UNIT/ML ~~LOC~~ SOLN
10.0000 [IU] | Freq: Once | SUBCUTANEOUS | Status: AC
  Administered 2013-08-28: 10 [IU] via SUBCUTANEOUS
  Filled 2013-08-27: qty 1

## 2013-08-27 MED ORDER — ONDANSETRON HCL 4 MG/2ML IJ SOLN
4.0000 mg | Freq: Once | INTRAMUSCULAR | Status: AC
  Administered 2013-08-27: 4 mg via INTRAVENOUS
  Filled 2013-08-27: qty 2

## 2013-08-27 MED ORDER — ASPIRIN 81 MG PO CHEW
324.0000 mg | CHEWABLE_TABLET | Freq: Once | ORAL | Status: AC
  Administered 2013-08-27: 324 mg via ORAL
  Filled 2013-08-27: qty 4

## 2013-08-27 NOTE — ED Notes (Signed)
RN discussed with MD pt's symptoms and vitals. CT head verbally ordered. Pt neurologically intact. CT notified.

## 2013-08-27 NOTE — ED Notes (Signed)
Pt not in room, pt in xray/CT.

## 2013-08-27 NOTE — ED Provider Notes (Signed)
CSN: [id number]     Arrival date & time 08/27/13  2103 History   First MD Initiated Contact with Patient 08/27/13 2145     Chief Complaint  Patient presents with  . Chest Pain  . Dizziness     (Consider location/radiation/quality/duration/timing/severity/associated sxs/prior Treatment) HPI  53 year old female with history of hepatitis C, non-insulin-dependent diabetes, GERD, gastric ulcer presents for evaluation of chest pain and headache. Patient reports 2 days gradual onset of left-sided 8/10 chest pain which patient described as a squeezing sensation, radiates to left arm and also radiates to back. In no diaphoresis but without nausea or vomiting. No complaints of shortness of breath or cough. Endorse lightheadedness and dizziness worsening with positional change. The symptom has not made better or worse with food or medication .  Checked her BP and it was high in the 200s.  Has hx of esophageal spasm, but sts this pain is different.  Denies having similar pain in the past. Also endorsed a severe headache since this morning.  Headache has been constant, pounding, in the center of head.  Nothing makes it better or worse.  Pain is 8/10.  Non smoker, no drug/alcohol use.  No hx of blood clots.  Has been taking Aleve, Advil, Peptol Bismo, BP medication without relief.  Report feeling more forgetful today than usual, which is new for her.  Denies facial numbness, focal weakness, or speech difficulty.  No prior hx of stroke.    Past Medical History  Diagnosis Date  . Diabetes mellitus without complication   . Hepatitis C   . Arthritis     hips  . GERD (gastroesophageal reflux disease)   . Hypertension   . Ulcer     hx gastric ulcers  . Esophageal spasm    Past Surgical History  Procedure Laterality Date  . Cholecystectomy    . Abdominal hysterectomy    . Appendectomy    . Bone spur removal Right     heel  . Tonsillectomy    . Esophagogastroduodenoscopy N/A 12/29/2012    Procedure:  ESOPHAGOGASTRODUODENOSCOPY (EGD);  Surgeon: Inda Castle, MD;  Location: [facility];  Service: Endoscopy;  Laterality: N/A;  . Botox injection  12/29/2012    Procedure: BOTOX INJECTION;  Surgeon: Inda Castle, MD;  Location: [facility];  Service: Endoscopy;;  . Balloon dilation N/A 12/29/2012    Procedure: Larrie Kass DILATION;  Surgeon: Inda Castle, MD;  Location: [facility];  Service: Endoscopy;  Laterality: N/A;  . Esophagogastroduodenoscopy N/A 06/25/2013    Procedure: ESOPHAGOGASTRODUODENOSCOPY (EGD);  Surgeon: Inda Castle, MD;  Location: [facility];  Service: Endoscopy;  Laterality: N/A;  . Balloon dilation N/A 06/25/2013    Procedure: BALLOON DILATION;  Surgeon: Inda Castle, MD;  Location: [facility];  Service: Endoscopy;  Laterality: N/A;  . Botox injection N/A 06/25/2013    Procedure: BOTOX INJECTION;  Surgeon: Inda Castle, MD;  Location: [facility];  Service: Endoscopy;  Laterality: N/A;   Family History  Problem Relation Age of Onset  . Colon cancer Neg Hx   . Rectal cancer Neg Hx   . Stomach cancer Neg Hx   . Throat cancer Sister 41    died at age 36  . Alzheimer's disease Father   . Hypertension Mother    History  Substance Use Topics  . Smoking status: Never Smoker   . Smokeless tobacco: Never Used  . Alcohol Use: No   OB History   Grav Para Term Preterm Abortions TAB  SAB Ect Mult Living                 Review of Systems  All other systems reviewed and are negative.     Allergies  Codeine  Home Medications   Prior to Admission medications   Medication Sig Start Date End Date Taking? Authorizing Provider  dexlansoprazole (DEXILANT) 60 MG capsule Take 60 mg by mouth daily.   Yes Historical Provider, MD  glimepiride (AMARYL) 4 MG tablet Take 4 mg by mouth 2 (two) times daily.   Yes Historical Provider, MD  labetalol (NORMODYNE) 200 MG tablet Take 200 mg by mouth 2 (two) times daily.   Yes Historical Provider, MD  lisinopril  (PRINIVIL,ZESTRIL) 20 MG tablet Take 20 mg by mouth 2 (two) times daily.   Yes Historical Provider, MD  naproxen sodium (ANAPROX) 220 MG tablet Take 440 mg by mouth 2 (two) times daily with a meal.    Yes Historical Provider, MD  nitroGLYCERIN (NITRODUR - DOSED IN MG/24 HR) 0.2 mg/hr patch Place 0.2 mg onto the skin daily.   Yes Historical Provider, MD  nitroGLYCERIN (NITROSTAT) 0.4 MG SL tablet Place 0.4 mg under the tongue every 5 (five) minutes as needed for chest pain.   Yes Historical Provider, MD  omeprazole (PRILOSEC) 40 MG capsule Take 40 mg by mouth 2 (two) times daily.   Yes Historical Provider, MD  sucralfate (CARAFATE) 1 GM/10ML suspension Take 10 mLs (1 g total) by mouth 4 (four) times daily -  with meals and at bedtime. 08/19/13  Yes Jessica D. Zehr, PA-C   BP 187/104  Pulse 74  Temp(Src) 98.1 F (36.7 C) (Oral)  Resp 16  Wt 225 lb (102.059 kg)  SpO2 99% Physical Exam  Nursing note and vitals reviewed. Constitutional: She is oriented to person, place, and time. She appears well-developed and well-nourished. No distress.  HENT:  Head: Atraumatic.  Cerumen impaction to ear bilaterally  Eyes: Conjunctivae and EOM are normal. Pupils are equal, round, and reactive to light.  Horizontal nystagmus favoring L side.    Neck: Normal range of motion. Neck supple.  No nuchal rigidity  Cardiovascular: Normal rate, regular rhythm and intact distal pulses.   Pulmonary/Chest: Effort normal and breath sounds normal. She exhibits no tenderness.  Abdominal: Soft. She exhibits no distension. There is no tenderness.  Musculoskeletal: She exhibits no edema.  Neurological: She is alert and oriented to person, place, and time.  Neurologic exam:  Speech clear, pupils equal round reactive to light, extraocular movements intact  Normal peripheral visual fields Cranial nerves III through XII normal including no facial droop Follows commands, moves all extremities x4, normal strength to bilateral  upper and lower extremities at all major muscle groups including grip Sensation normal to light touch  Coordination intact, no limb ataxia, finger-nose-finger normal Rapid alternating movements normal No pronator drift Gait normal   Skin: No rash noted.  Psychiatric: She has a normal mood and affect.    ED Course  Procedures (including critical care time)  Pt here with active CP x 2 days and also headache x 1 day.  Patient has moderate risk factors for ACS, with a heart score of 4, PERC negative.  She does not have any focal neuro deficit on exam, head CT scan is without acute intracranial abnormality. Her EKG showed new ST inversion in the inferior leads. Her troponin is negative other the pain is been ongoing for 2 days. Doubt acute coronary syndrome. Her initial blood pressure  was [id number] systolic which has improved without any specific treatment. Her blood sugar is 399 without evidence of anion gap concerning for DKA. Care discussed with Dr. Dina Rich.    12:15 AM Pt continues to endorse moderate headache but now report worsening chest pain despite receiving ASA, and morphine. ECG shows ST changes, trop neg.  Will treat sxs as hypertensive urgency.  Her BP is now in 190s.  Labetalol given, Nitro drip initiated.  Pt made aware nitro drip may worsening headache.  Plan to have pt admitted for further care.    12:44 AM Will repeat ECG and obtain delta trop.  Will continue treating her sxs.  I have consulted [facility], Dr. Roel Cluck, who agrees to admit pt to step down, team 10, under her care for further management.    12:47 AM Repeat ECG demonstrates no worsening ECG changes.    1:02 AM Dr. Roel Cluck has evaluated pt and felt pt may benefit from chest/abd/pelvis CT to r/o dissection given her worsening pain.  Test has been ordered.  Dr. Roel Cluck will f/u on result.    1:22 AM Pt drops her BP from [id number] to [id number] systolic without any specific treatment    CRITICAL CARE Performed by:  Trecia Maring Total critical care time: 45 min Critical care time was exclusive of separately billable procedures and treating other patients. Critical care was necessary to treat or prevent imminent or life-threatening deterioration. Critical care was time spent personally by me on the following activities: development of treatment plan with patient and/or surrogate as well as nursing, discussions with consultants, evaluation of patient's response to treatment, examination of patient, obtaining history from patient or surrogate, ordering and performing treatments and interventions, ordering and review of laboratory studies, ordering and review of radiographic studies, pulse oximetry and re-evaluation of patient's condition.   Labs Review Labs Reviewed  CBC WITH DIFFERENTIAL - Abnormal; Notable for the following:    HCT 34.5 (*)    MCV 77.2 (*)    All other components within normal limits  COMPREHENSIVE METABOLIC PANEL - Abnormal; Notable for the following:    Sodium 136 (*)    Glucose, Bld 399 (*)    Albumin 3.4 (*)    Alkaline Phosphatase 150 (*)    GFR calc non Af Amer 65 (*)    GFR calc Af Amer 76 (*)    Anion gap 17 (*)    All other components within normal limits  I-STAT TROPOININ, ED  CBG MONITORING, ED  Randolm Idol, ED    Imaging Review Dg Chest 2 View  08/27/2013   CLINICAL DATA:  Centralized chest pain with dizziness and headache. Hypertension.  EXAM: CHEST  2 VIEW  COMPARISON:  11/23/2012  FINDINGS: Lungs are adequately inflated without consolidation or effusion. Cardiomediastinal silhouette and remainder the exam is unchanged.  IMPRESSION: No active cardiopulmonary disease.   Electronically Signed   By: Marin Olp M.D.   On: 08/27/2013 21:56   Ct Head Wo Contrast  08/27/2013   CLINICAL DATA:  Chest pain and dizziness with headache and hypertension.  EXAM: CT HEAD WITHOUT CONTRAST  TECHNIQUE: Contiguous axial images were obtained from the base of the skull through the  vertex without intravenous contrast.  COMPARISON:  07/08/2012 and 09/06/2007  FINDINGS: Ventricles, cisterns and other CSF spaces are within normal. There is no mass, mass effect, shift of midline structures or acute hemorrhage. There is no evidence to suggest acute infarction. Bones a soft tissues are within normal.  IMPRESSION: No acute  intracranial findings.   Electronically Signed   By: Marin Olp M.D.   On: 08/27/2013 21:55     EKG Interpretation None      Date: 08/27/2013  Rate: 83  Rhythm: normal sinus rhythm  QRS Axis: normal  Intervals: normal  ST/T Wave abnormalities: nonspecific T wave changes  Conduction Disutrbances:none  Narrative Interpretation:   Old EKG Reviewed: none available    MDM   Final diagnoses:  Hypertensive urgency  Chest pain, unspecified chest pain type  Headache, unspecified headache type    BP 165/83  Pulse 71  Temp(Src) 98.1 F (36.7 C) (Oral)  Resp 15  Wt 225 lb (102.059 kg)  SpO2 92%  I have reviewed nursing notes and vital signs. I personally reviewed the imaging tests through PACS system  I reviewed available ER/hospitalization records thought the EMR      Domenic Moras, PA-C 08/28/13 0102  Domenic Moras, PA-C 09/02/13 [id number]

## 2013-08-27 NOTE — ED Notes (Addendum)
Pt reports dizziness starting this morning. BP running high 200s/100s. Chest pain starting yesterday described as pressure into arm. Diaphoresis but denies nausea and vomiting. C/o bad headache unrelieved by OTC meds. Has hx of esophageal spasms but states this does not feel like it.

## 2013-08-28 ENCOUNTER — Encounter (HOSPITAL_COMMUNITY): Payer: Self-pay | Admitting: Internal Medicine

## 2013-08-28 ENCOUNTER — Inpatient Hospital Stay (HOSPITAL_COMMUNITY): Payer: 59

## 2013-08-28 DIAGNOSIS — I160 Hypertensive urgency: Secondary | ICD-10-CM | POA: Diagnosis present

## 2013-08-28 DIAGNOSIS — K224 Dyskinesia of esophagus: Secondary | ICD-10-CM

## 2013-08-28 DIAGNOSIS — E119 Type 2 diabetes mellitus without complications: Secondary | ICD-10-CM

## 2013-08-28 DIAGNOSIS — I10 Essential (primary) hypertension: Principal | ICD-10-CM

## 2013-08-28 DIAGNOSIS — R079 Chest pain, unspecified: Secondary | ICD-10-CM | POA: Diagnosis present

## 2013-08-28 DIAGNOSIS — R0789 Other chest pain: Secondary | ICD-10-CM

## 2013-08-28 DIAGNOSIS — E7800 Pure hypercholesterolemia, unspecified: Secondary | ICD-10-CM

## 2013-08-28 DIAGNOSIS — B182 Chronic viral hepatitis C: Secondary | ICD-10-CM

## 2013-08-28 HISTORY — DX: Hypertensive urgency: I16.0

## 2013-08-28 HISTORY — DX: Chest pain, unspecified: R07.9

## 2013-08-28 LAB — URINALYSIS, ROUTINE W REFLEX MICROSCOPIC
Bilirubin Urine: NEGATIVE
GLUCOSE, UA: 100 mg/dL — AB
KETONES UR: NEGATIVE mg/dL
Leukocytes, UA: NEGATIVE
Nitrite: NEGATIVE
PH: 5.5 (ref 5.0–8.0)
Protein, ur: 100 mg/dL — AB
Specific Gravity, Urine: 1.01 (ref 1.005–1.030)
Urobilinogen, UA: 1 mg/dL (ref 0.0–1.0)

## 2013-08-28 LAB — CBC
HCT: 33 % — ABNORMAL LOW (ref 36.0–46.0)
Hemoglobin: 11.6 g/dL — ABNORMAL LOW (ref 12.0–15.0)
MCH: 27.4 pg (ref 26.0–34.0)
MCHC: 35.2 g/dL (ref 30.0–36.0)
MCV: 77.8 fL — ABNORMAL LOW (ref 78.0–100.0)
PLATELETS: 185 10*3/uL (ref 150–400)
RBC: 4.24 MIL/uL (ref 3.87–5.11)
RDW: 13.3 % (ref 11.5–15.5)
WBC: 7.4 10*3/uL (ref 4.0–10.5)

## 2013-08-28 LAB — CBG MONITORING, ED
GLUCOSE-CAPILLARY: 261 mg/dL — AB (ref 70–99)
GLUCOSE-CAPILLARY: 387 mg/dL — AB (ref 70–99)
GLUCOSE-CAPILLARY: 84 mg/dL (ref 70–99)
Glucose-Capillary: 110 mg/dL — ABNORMAL HIGH (ref 70–99)
Glucose-Capillary: 95 mg/dL (ref 70–99)

## 2013-08-28 LAB — COMPREHENSIVE METABOLIC PANEL
ALT: 30 U/L (ref 0–35)
AST: 29 U/L (ref 0–37)
Albumin: 3.4 g/dL — ABNORMAL LOW (ref 3.5–5.2)
Alkaline Phosphatase: 105 U/L (ref 39–117)
Anion gap: 12 (ref 5–15)
BILIRUBIN TOTAL: 0.5 mg/dL (ref 0.3–1.2)
BUN: 13 mg/dL (ref 6–23)
CO2: 27 meq/L (ref 19–32)
CREATININE: 0.82 mg/dL (ref 0.50–1.10)
Calcium: 8.6 mg/dL (ref 8.4–10.5)
Chloride: 103 mEq/L (ref 96–112)
GFR calc Af Amer: >90 mL/min (ref >90)
GFR, EST NON AFRICAN AMERICAN: 81 mL/min — AB (ref >90)
Glucose, Bld: 80 mg/dL (ref 70–99)
Potassium: 3.7 mEq/L (ref 3.7–5.3)
Sodium: 142 mEq/L (ref 137–147)
Total Protein: 6.5 g/dL (ref 6.0–8.3)

## 2013-08-28 LAB — GLUCOSE, CAPILLARY
Glucose-Capillary: 185 mg/dL — ABNORMAL HIGH (ref 70–99)
Glucose-Capillary: 160 mg/dL — ABNORMAL HIGH (ref 70–99)
Glucose-Capillary: 98 mg/dL (ref 70–99)

## 2013-08-28 LAB — HEMOGLOBIN A1C
Hgb A1c MFr Bld: 12.2 % — ABNORMAL HIGH (ref <5.7)
Mean Plasma Glucose: 303 mg/dL — ABNORMAL HIGH (ref <117)

## 2013-08-28 LAB — TSH: TSH: 2.18 u[IU]/mL (ref 0.350–4.500)

## 2013-08-28 LAB — LIPID PANEL
CHOL/HDL RATIO: 3.7 ratio
CHOLESTEROL: 224 mg/dL — AB (ref 0–200)
HDL: 61 mg/dL (ref >39)
LDL Cholesterol: 142 mg/dL — ABNORMAL HIGH (ref 0–99)
TRIGLYCERIDES: 103 mg/dL (ref <150)
VLDL: 21 mg/dL (ref 0–40)

## 2013-08-28 LAB — PHOSPHORUS: Phosphorus: 4 mg/dL (ref 2.3–4.6)

## 2013-08-28 LAB — URINE MICROSCOPIC-ADD ON

## 2013-08-28 LAB — TROPONIN I
Troponin I: <0.3 ng/mL (ref <0.30)
Troponin I: <0.3 ng/mL (ref <0.30)

## 2013-08-28 LAB — MRSA PCR SCREENING: MRSA by PCR: NEGATIVE

## 2013-08-28 LAB — MAGNESIUM: Magnesium: 1.7 mg/dL (ref 1.5–2.5)

## 2013-08-28 LAB — I-STAT TROPONIN, ED: TROPONIN I, POC: 0 ng/mL (ref 0.00–0.08)

## 2013-08-28 LAB — POC OCCULT BLOOD, ED: Fecal Occult Bld: NEGATIVE

## 2013-08-28 MED ORDER — SUCRALFATE 1 GM/10ML PO SUSP
1.0000 g | Freq: Three times a day (TID) | ORAL | Status: DC
  Administered 2013-08-28 – 2013-08-30 (×6): 1 g via ORAL
  Filled 2013-08-28 (×13): qty 10

## 2013-08-28 MED ORDER — ASPIRIN EC 81 MG PO TBEC
81.0000 mg | DELAYED_RELEASE_TABLET | Freq: Every day | ORAL | Status: DC
  Administered 2013-08-28 – 2013-08-30 (×3): 81 mg via ORAL
  Filled 2013-08-28 (×3): qty 1

## 2013-08-28 MED ORDER — HYDROMORPHONE HCL PF 1 MG/ML IJ SOLN: 1.0000 mg | INTRAMUSCULAR | Status: DC | PRN

## 2013-08-28 MED ORDER — IOHEXOL 350 MG/ML SOLN
100.0000 mL | Freq: Once | INTRAVENOUS | Status: AC | PRN
  Administered 2013-08-28: 100 mL via INTRAVENOUS

## 2013-08-28 MED ORDER — HYDROCODONE-ACETAMINOPHEN 5-325 MG PO TABS
1.0000 | ORAL_TABLET | ORAL | Status: DC | PRN
  Administered 2013-08-28: 1 via ORAL
  Administered 2013-08-28: 2 via ORAL
  Administered 2013-08-29 – 2013-08-30 (×2): 1 via ORAL
  Filled 2013-08-28: qty 2
  Filled 2013-08-28 (×2): qty 1
  Filled 2013-08-28: qty 2

## 2013-08-28 MED ORDER — ATORVASTATIN CALCIUM 40 MG PO TABS
40.0000 mg | ORAL_TABLET | Freq: Every day | ORAL | Status: DC
  Administered 2013-08-28 – 2013-08-29 (×2): 40 mg via ORAL
  Filled 2013-08-28 (×3): qty 1

## 2013-08-28 MED ORDER — ONDANSETRON HCL 4 MG/2ML IJ SOLN: 4.0000 mg | Freq: Four times a day (QID) | INTRAMUSCULAR | Status: DC | PRN

## 2013-08-28 MED ORDER — LISINOPRIL 20 MG PO TABS
20.0000 mg | ORAL_TABLET | Freq: Two times a day (BID) | ORAL | Status: DC
  Administered 2013-08-28 – 2013-08-30 (×4): 20 mg via ORAL
  Filled 2013-08-28 (×9): qty 1

## 2013-08-28 MED ORDER — DOCUSATE SODIUM 100 MG PO CAPS
100.0000 mg | ORAL_CAPSULE | Freq: Two times a day (BID) | ORAL | Status: DC
  Administered 2013-08-28 – 2013-08-30 (×5): 100 mg via ORAL
  Filled 2013-08-28 (×6): qty 1

## 2013-08-28 MED ORDER — POLYETHYLENE GLYCOL 3350 17 G PO PACK
17.0000 g | PACK | Freq: Every day | ORAL | Status: DC | PRN
  Filled 2013-08-28: qty 1

## 2013-08-28 MED ORDER — DIPHENHYDRAMINE HCL 50 MG/ML IJ SOLN
25.0000 mg | Freq: Once | INTRAMUSCULAR | Status: AC
  Administered 2013-08-28: 25 mg via INTRAVENOUS
  Filled 2013-08-28: qty 1

## 2013-08-28 MED ORDER — INSULIN ASPART 100 UNIT/ML ~~LOC~~ SOLN
0.0000 [IU] | SUBCUTANEOUS | Status: DC
  Administered 2013-08-28: 9 [IU] via SUBCUTANEOUS
  Administered 2013-08-28: 2 [IU] via SUBCUTANEOUS
  Administered 2013-08-29: 7 [IU] via SUBCUTANEOUS
  Administered 2013-08-29: 5 [IU] via SUBCUTANEOUS
  Administered 2013-08-29: 7 [IU] via SUBCUTANEOUS
  Administered 2013-08-29: 1 [IU] via SUBCUTANEOUS
  Administered 2013-08-29: 5 [IU] via SUBCUTANEOUS
  Administered 2013-08-30: 3 [IU] via SUBCUTANEOUS
  Administered 2013-08-30 (×2): 5 [IU] via SUBCUTANEOUS
  Filled 2013-08-28: qty 1

## 2013-08-28 MED ORDER — PANTOPRAZOLE SODIUM 40 MG PO TBEC: 40.0000 mg | DELAYED_RELEASE_TABLET | Freq: Every day | ORAL | Status: DC

## 2013-08-28 MED ORDER — ENOXAPARIN SODIUM 40 MG/0.4ML ~~LOC~~ SOLN
40.0000 mg | SUBCUTANEOUS | Status: DC
  Administered 2013-08-28 – 2013-08-30 (×3): 40 mg via SUBCUTANEOUS
  Filled 2013-08-28 (×4): qty 0.4

## 2013-08-28 MED ORDER — GI COCKTAIL ~~LOC~~
30.0000 mL | Freq: Once | ORAL | Status: AC
  Administered 2013-08-28: 30 mL via ORAL
  Filled 2013-08-28: qty 30

## 2013-08-28 MED ORDER — HYDRALAZINE HCL 20 MG/ML IJ SOLN: 10.0000 mg | INTRAMUSCULAR | Status: DC | PRN

## 2013-08-28 MED ORDER — LABETALOL HCL 5 MG/ML IV SOLN
20.0000 mg | Freq: Once | INTRAVENOUS | Status: AC
  Administered 2013-08-28: 20 mg via INTRAVENOUS
  Filled 2013-08-28: qty 4

## 2013-08-28 MED ORDER — SODIUM CHLORIDE 0.9 % IV SOLN: 250.0000 mL | INTRAVENOUS | Status: DC | PRN

## 2013-08-28 MED ORDER — SENNA 8.6 MG PO TABS
1.0000 | ORAL_TABLET | Freq: Two times a day (BID) | ORAL | Status: DC
  Administered 2013-08-28 – 2013-08-30 (×5): 8.6 mg via ORAL
  Filled 2013-08-28 (×6): qty 1

## 2013-08-28 MED ORDER — ONDANSETRON HCL 4 MG/2ML IJ SOLN: 4.0000 mg | Freq: Three times a day (TID) | INTRAMUSCULAR | Status: DC | PRN

## 2013-08-28 MED ORDER — MORPHINE SULFATE 4 MG/ML IJ SOLN
4.0000 mg | INTRAMUSCULAR | Status: DC | PRN
  Administered 2013-08-28 – 2013-08-30 (×6): 4 mg via INTRAVENOUS
  Filled 2013-08-28 (×6): qty 1

## 2013-08-28 MED ORDER — SODIUM CHLORIDE 0.9 % IJ SOLN
3.0000 mL | Freq: Two times a day (BID) | INTRAMUSCULAR | Status: DC
  Administered 2013-08-28 – 2013-08-29 (×3): 3 mL via INTRAVENOUS

## 2013-08-28 MED ORDER — SODIUM CHLORIDE 0.9 % IJ SOLN: 3.0000 mL | INTRAMUSCULAR | Status: DC | PRN

## 2013-08-28 MED ORDER — ACETAMINOPHEN 325 MG PO TABS: 650.0000 mg | ORAL_TABLET | Freq: Four times a day (QID) | ORAL | Status: DC | PRN

## 2013-08-28 MED ORDER — DIPHENHYDRAMINE HCL 50 MG/ML IJ SOLN
25.0000 mg | Freq: Four times a day (QID) | INTRAMUSCULAR | Status: DC | PRN
  Administered 2013-08-28 – 2013-08-30 (×4): 25 mg via INTRAVENOUS
  Filled 2013-08-28 (×5): qty 1

## 2013-08-28 MED ORDER — ONDANSETRON HCL 4 MG PO TABS: 4.0000 mg | ORAL_TABLET | Freq: Four times a day (QID) | ORAL | Status: DC | PRN

## 2013-08-28 MED ORDER — PNEUMOCOCCAL VAC POLYVALENT 25 MCG/0.5ML IJ INJ
0.5000 mL | INJECTION | INTRAMUSCULAR | Status: AC
  Administered 2013-08-29: 0.5 mL via INTRAMUSCULAR
  Filled 2013-08-28: qty 0.5

## 2013-08-28 MED ORDER — ACETAMINOPHEN 650 MG RE SUPP: 650.0000 mg | Freq: Four times a day (QID) | RECTAL | Status: DC | PRN

## 2013-08-28 MED ORDER — DIPHENHYDRAMINE HCL 50 MG/ML IJ SOLN
25.0000 mg | Freq: Once | INTRAMUSCULAR | Status: AC
Start: 2013-08-28 — End: 2013-08-28
  Administered 2013-08-28: 25 mg via INTRAVENOUS
  Filled 2013-08-28: qty 1

## 2013-08-28 MED ORDER — LABETALOL HCL 200 MG PO TABS
200.0000 mg | ORAL_TABLET | Freq: Two times a day (BID) | ORAL | Status: DC
  Administered 2013-08-28 – 2013-08-29 (×3): 200 mg via ORAL
  Filled 2013-08-28 (×5): qty 1

## 2013-08-28 MED ORDER — NITROGLYCERIN IN D5W 200-5 MCG/ML-% IV SOLN
5.0000 ug/min | INTRAVENOUS | Status: DC
  Filled 2013-08-28: qty 250

## 2013-08-28 MED ORDER — PANTOPRAZOLE SODIUM 40 MG IV SOLR
40.0000 mg | Freq: Two times a day (BID) | INTRAVENOUS | Status: DC
  Administered 2013-08-28 – 2013-08-30 (×5): 40 mg via INTRAVENOUS
  Filled 2013-08-28 (×7): qty 40

## 2013-08-28 NOTE — ED Notes (Signed)
HA better 5/10, CP unchanged 10/10, no change in presentation, VS improved, pt remains alert, NAD, calm, interactive, speech pressured and hoarse when vocalizing complaints, no dyspnea noted, VSS, pt to CT.

## 2013-08-28 NOTE — ED Notes (Signed)
RN notified CBG 84

## 2013-08-28 NOTE — Progress Notes (Signed)
Arrived to unit from ED alert; drowsy. Recently  Given Percocet and Morphine per nurse.

## 2013-08-28 NOTE — H&P (Signed)
PCP:  [name], MD    Chief Complaint:  Chest pain  HPI: [name] [name] is a 53 y.o. female   has a past medical history of Diabetes mellitus without complication; Hepatitis C; Arthritis; GERD (gastroesophageal reflux disease); Hypertension; Ulcer; and Esophageal spasm.   Presented with  Patient have had steadily worsening chest pain for the past 2 days. This have been increasing in duration and severity. Activity made her feel light headed. She woke up with a pounding headache. Chest pain was radiating to the back and left arm feels like pressure "like a balloon ready to burst". Patient checked her BP and it was up to [id number] systolic usually in [id number] range. Patient presented to ER on arrival her BP was 213/120 she was given labetalol, nitro and 1 mg of dilaudid. After this her blood pressure went down to 100/55. She continued to have severe chest pain but headache have resolved. Of note patient has hx of esophageal spasm and stricture needing dilatation in the past but stes this pain is very different. CT per dissection protocol was done and was unremarkable. Patient continues to have chest pain although somewhat improved. Trop 0.00 x2, ECG unchanged from prior.   Hospitalist was called for admission for hypertensive urgency  Review of Systems:    Pertinent positives include:  chest pain, headaches, dizziness, palpitations  Constitutional:  No weight loss, night sweats, Fevers, chills, fatigue, weight loss  HEENT:  No  Difficulty swallowing,Tooth/dental problems,Sore throat,  No sneezing, itching, ear ache, nasal congestion, post nasal drip,  Cardio-vascular:  No Orthopnea, PND, anasarca,.no Bilateral lower extremity swelling  GI:  No heartburn, indigestion, abdominal pain, nausea, vomiting, diarrhea, change in bowel habits, loss of appetite, melena, blood in stool, hematemesis Resp:  no shortness of breath at rest. No dyspnea on exertion, No excess mucus, no productive cough, No  non-productive cough, No coughing up of blood.No change in color of mucus.No wheezing. Skin:  no rash or lesions. No jaundice GU:  no dysuria, change in color of urine, no urgency or frequency. No straining to urinate.  No flank pain.  Musculoskeletal:  No joint pain or no joint swelling. No decreased range of motion. No back pain.  Psych:  No change in mood or affect. No depression or anxiety. No memory loss.  Neuro: no localizing neurological complaints, no tingling, no weakness, no double vision, no gait abnormality, no slurred speech, no confusion  Otherwise ROS are negative except for above, 10 systems were reviewed  Past Medical History: Past Medical History  Diagnosis Date  . Diabetes mellitus without complication   . Hepatitis C   . Arthritis     hips  . GERD (gastroesophageal reflux disease)   . Hypertension   . Ulcer     hx gastric ulcers  . Esophageal spasm    Past Surgical History  Procedure Laterality Date  . Cholecystectomy    . Abdominal hysterectomy    . Appendectomy    . Bone spur removal Right     heel  . Tonsillectomy    . Esophagogastroduodenoscopy N/A 12/29/2012    Procedure: ESOPHAGOGASTRODUODENOSCOPY (EGD);  Surgeon: Inda Castle, MD;  Location: [facility];  Service: Endoscopy;  Laterality: N/A;  . Botox injection  12/29/2012    Procedure: BOTOX INJECTION;  Surgeon: Inda Castle, MD;  Location: [facility];  Service: Endoscopy;;  . Balloon dilation N/A 12/29/2012    Procedure: Larrie Kass DILATION;  Surgeon: Inda Castle, MD;  Location: [facility];  Service: Endoscopy;  Laterality: N/A;  . Esophagogastroduodenoscopy N/A 06/25/2013    Procedure: ESOPHAGOGASTRODUODENOSCOPY (EGD);  Surgeon: Inda Castle, MD;  Location: [facility];  Service: Endoscopy;  Laterality: N/A;  . Balloon dilation N/A 06/25/2013    Procedure: BALLOON DILATION;  Surgeon: Inda Castle, MD;  Location: [facility];  Service: Endoscopy;  Laterality: N/A;  . Botox  injection N/A 06/25/2013    Procedure: BOTOX INJECTION;  Surgeon: Inda Castle, MD;  Location: [facility];  Service: Endoscopy;  Laterality: N/A;     Medications: Prior to Admission medications   Medication Sig Start Date End Date Taking? Authorizing Provider  dexlansoprazole (DEXILANT) 60 MG capsule Take 60 mg by mouth daily.   Yes Historical Provider, MD  glimepiride (AMARYL) 4 MG tablet Take 4 mg by mouth 2 (two) times daily.   Yes Historical Provider, MD  labetalol (NORMODYNE) 200 MG tablet Take 200 mg by mouth 2 (two) times daily.   Yes Historical Provider, MD  lisinopril (PRINIVIL,ZESTRIL) 20 MG tablet Take 20 mg by mouth 2 (two) times daily.   Yes Historical Provider, MD  naproxen sodium (ANAPROX) 220 MG tablet Take 440 mg by mouth 2 (two) times daily with a meal.    Yes Historical Provider, MD  nitroGLYCERIN (NITRODUR - DOSED IN MG/24 HR) 0.2 mg/hr patch Place 0.2 mg onto the skin daily.   Yes Historical Provider, MD  nitroGLYCERIN (NITROSTAT) 0.4 MG SL tablet Place 0.4 mg under the tongue every 5 (five) minutes as needed for chest pain.   Yes Historical Provider, MD  omeprazole (PRILOSEC) 40 MG capsule Take 40 mg by mouth 2 (two) times daily.   Yes Historical Provider, MD  sucralfate (CARAFATE) 1 GM/10ML suspension Take 10 mLs (1 g total) by mouth 4 (four) times daily -  with meals and at bedtime. 08/19/13  Yes Jessica D. Zehr, PA-C    Allergies:   Allergies  Allergen Reactions  . Codeine Itching    No hives or rash, takes benadryl    Social History:  Ambulatory  independently   Lives at home With family     reports that she has never smoked. She has never used smokeless tobacco. She reports that she does not drink alcohol or use illicit drugs.    Family History: family history includes Alzheimer's disease in her father; Hypertension in her mother; Throat cancer (age of onset: 20) in her sister. There is no history of Colon cancer, Rectal cancer, or Stomach cancer.     Physical Exam: Patient Vitals for the past 24 hrs:  BP Temp Temp src Pulse Resp SpO2 Weight  08/28/13 0100 100/55 mmHg - - 72 21 91 % -  08/28/13 0058 109/75 mmHg - - 71 20 92 % -  08/28/13 0045 177/116 mmHg - - 75 22 95 % -  08/28/13 0030 165/83 mmHg - - 71 15 92 % -  08/28/13 0015 171/122 mmHg - - 71 21 95 % -  08/28/13 0000 184/106 mmHg - - 70 18 96 % -  08/27/13 2348 194/98 mmHg - - 75 14 99 % -  08/27/13 2330 184/96 mmHg - - 77 19 97 % -  08/27/13 2315 193/108 mmHg - - 77 16 98 % -  08/27/13 2245 213/120 mmHg - - 73 23 97 % -  08/27/13 2230 167/91 mmHg - - 70 20 97 % -  08/27/13 2215 175/102 mmHg - - 74 16 98 % -  08/27/13 2200 185/108 mmHg - - 73  19 98 % -  08/27/13 2151 187/104 mmHg - - 74 16 99 % -  08/27/13 2122 - 98.1 F (36.7 C) Oral - - - -  08/27/13 2119 - - - - - - 102.059 kg (225 lb)  08/27/13 2112 202/101 mmHg - - 78 18 98 % -    1. General:  Appears to be uncomfortable 2. Psychological: Alert and   Oriented 3. Head/ENT:   Moist   Mucous Membranes                          Head Non traumatic, neck supple                          Norma Dentition 4. SKIN: normal  Skin turgor,  Skin clean Dry and intact no rash 5. Heart: Regular rate and rhythm no Murmur, Rub or gallop 6. Lungs: Clear to auscultation bilaterally, no wheezes or crackles   7. Abdomen: Soft, non-tender, Non distended, obese 8. Lower extremities: no clubbing, cyanosis, or edema 9. Neurologically Grossly intact, moving all 4 extremities equally 10. MSK: Normal range of motion Pulses equal bilateraly  body mass index is 37.44 kg/(m^2).   Labs on Admission:   Recent Labs  08/27/13 2113  NA 136*  K 4.3  CL 96  CO2 23  GLUCOSE 399*  BUN 13  CREATININE 0.98  CALCIUM 8.8    Recent Labs  08/27/13 2113  AST 30  ALT 32  ALKPHOS 150*  BILITOT 0.3  PROT 6.6  ALBUMIN 3.4*   No results found for this basename: LIPASE, AMYLASE,  in the last 72 hours  Recent Labs  08/27/13 2113  WBC  8.2  NEUTROABS 4.3  HGB 12.3  HCT 34.5*  MCV 77.2*  PLT 185   No results found for this basename: CKTOTAL, CKMB, CKMBINDEX, TROPONINI,  in the last 72 hours No results found for this basename: TSH, T4TOTAL, FREET3, T3FREE, THYROIDAB,  in the last 72 hours No results found for this basename: VITAMINB12, FOLATE, FERRITIN, TIBC, IRON, RETICCTPCT,  in the last 72 hours No results found for this basename: HGBA1C    The CrCl is unknown because both a height and weight (above a minimum accepted value) are required for this calculation. ABG No results found for this basename: phart, pco2, po2, hco3, tco2, acidbasedef, o2sat     No results found for this basename: DDIMER     Other results:  I have pearsonaly reviewed this: ECG REPORT  Rate:70  Rhythm: SR ST&T Change: t wave inversion in V3-v6   BNP (last 3 results) No results found for this basename: PROBNP,  in the last 8760 hours  Filed Weights   08/27/13 2119  Weight: 102.059 kg (225 lb)     Cultures: No results found for this basename: sdes, specrequest, cult, reptstatus      Radiological Exams on Admission: Dg Chest 2 View  08/27/2013   CLINICAL DATA:  Centralized chest pain with dizziness and headache. Hypertension.  EXAM: CHEST  2 VIEW  COMPARISON:  11/23/2012  FINDINGS: Lungs are adequately inflated without consolidation or effusion. Cardiomediastinal silhouette and remainder the exam is unchanged.  IMPRESSION: No active cardiopulmonary disease.   Electronically Signed   By: Marin Olp M.D.   On: 08/27/2013 21:56   Ct Head Wo Contrast  08/27/2013   CLINICAL DATA:  Chest pain and dizziness with headache and hypertension.  EXAM: CT  HEAD WITHOUT CONTRAST  TECHNIQUE: Contiguous axial images were obtained from the base of the skull through the vertex without intravenous contrast.  COMPARISON:  07/08/2012 and 09/06/2007  FINDINGS: Ventricles, cisterns and other CSF spaces are within normal. There is no mass, mass effect,  shift of midline structures or acute hemorrhage. There is no evidence to suggest acute infarction. Bones a soft tissues are within normal.  IMPRESSION: No acute intracranial findings.   Electronically Signed   By: Marin Olp M.D.   On: 08/27/2013 21:55    Chart has been reviewed  Assessment/Plan  53 yo F with hx of DM and esophageal strictures here with severe chest pain over past 2 days, t- wave inversion on ECG which are persistent and normal troponin x2. Initilly with severe hypertension that now has improved  Present on Admission:  . Hypertensive urgency - BP now improved will avoid overaggressive management will restart home meds and monitor further.  . Chest pain - etiology unclear, given risk factors will continue to cycle CE, give GI cocktail, patient has hx of esophageal dysmotility. Negative CT is reassuring. Given ongoing chest pain will observe in stepdown . Dyskinesia of esophagus - Carafate, protonix, gi cocktail, GI consult in AM . DIABETES MELLITUS-TYPE II - hold PO mends continue SSI    Prophylaxis: SCD , Protonix  CODE STATUS:  FULL CODE   Other plan as per orders.  I have spent a total of 55 min on this admission  Jax Abdelrahman 08/28/2013, 1:08 AM  [facility]  Pager [phone]   If 7AM-7PM, please contact the day team taking care of the patient  [email or web address]  Password [email or web address]

## 2013-08-28 NOTE — ED Notes (Signed)
Attempted report x1. 

## 2013-08-28 NOTE — Progress Notes (Signed)
PATIENT DETAILS Name: [name] [name] Age: 53 y.o. Sex: female Date of Birth: [birth year] Admit Date: 08/27/2013 Admitting Physician Toy Baker, MD [id number], MD  Subjective: Continues to have intermittent pressure like chest pain that radiates down her left arm. Claims that its different from her usual "esophageal spasms"  Assessment/Plan: Active Problems: Hypertensive Urgency -BP now much better -stop Nitro infusion-change to prn Nitroglycerin if pain -transition to oral anti-hypertensives  Chest Pain -although has a hx of esophageal spasms-claims this current pain is "different"-pressure like radiating to the back and to the left arm.CT Angio of the chest-negative for aortic dissection or central Pul Embolism. EKG shows T wave inversion in v4-v6-given risk factors-DM,HTN,Dyslipidemia-will consult cardiology. Troponin negative so far.  Hx of Esophageal Dysmotility -see above-regarding chest pain -c/w Carafate, PPI-if cardiac eval negative-may need GI eval  DM -CBG's stable -c/w SSI  Disposition: Remain inpatient  DVT Prophylaxis: Prophylactic Lovenox  Code Status: Full code   Family Communication Spouse at bedside  Procedures:  None  CONSULTS:  cardiology  Time spent 40 minutes-which includes 50% of the time with face-to-face with patient/ family and coordinating care related to the above assessment and plan.    MEDICATIONS: Scheduled Meds: . aspirin EC  81 mg Oral Daily  . docusate sodium  100 mg Oral BID  . enoxaparin (LOVENOX) injection  40 mg Subcutaneous Q24H  . insulin aspart  0-9 Units Subcutaneous 6 times per day  . labetalol  200 mg Oral BID  . lisinopril  20 mg Oral BID  . pantoprazole  40 mg Oral Daily  . pantoprazole  40 mg Oral Daily  . senna  1 tablet Oral BID  . sodium chloride  3 mL Intravenous Q12H  . sodium chloride  3 mL Intravenous Q12H  . sucralfate  1 g Oral TID WC & HS   Continuous Infusions: . sodium  chloride    . nitroGLYCERIN Stopped (08/28/13 0104)   PRN Meds:.sodium chloride, acetaminophen, acetaminophen, hydrALAZINE, HYDROcodone-acetaminophen, morphine injection, nitroGLYCERIN, ondansetron (ZOFRAN) IV, ondansetron, polyethylene glycol, sodium chloride  Antibiotics: Anti-infectives   None       PHYSICAL EXAM: Vital signs in last 24 hours: Filed Vitals:   08/28/13 0730 08/28/13 0800 08/28/13 0845 08/28/13 0915  BP: 124/77 123/69 120/66 121/70  Pulse: 64 71 64 63  Temp:      TempSrc:      Resp:  16    Weight:      SpO2: 94% 93% 93% 95%    Weight change:  Filed Weights   08/27/13 2119  Weight: 102.059 kg (225 lb)   Body mass index is 37.44 kg/(m^2).   Gen Exam: Awake and alert with clear speech.   Neck: Supple, No JVD.   Chest: B/L Clear.   CVS: S1 S2 Regular, no murmurs.  Abdomen: soft, BS +, non tender, non distended.  Extremities: no edema, lower extremities warm to touch. Neurologic: Non Focal.   Skin: No Rash.   Wounds: N/A.    Intake/Output from previous day:  Intake/Output Summary (Last 24 hours) at 08/28/13 0949 Last data filed at 08/28/13 0152  Gross per 24 hour  Intake   1100 ml  Output      0 ml  Net   1100 ml     LAB RESULTS: CBC  Recent Labs Lab 08/27/13 2113 08/28/13 0849  WBC 8.2 7.4  HGB 12.3 11.6*  HCT 34.5* 33.0*  PLT 185 185  MCV 77.2* 77.8*  MCH 27.5 27.4  MCHC 35.7 35.2  RDW 13.3 13.3  LYMPHSABS 3.2  --   MONOABS 0.5  --   EOSABS 0.2  --   BASOSABS 0.0  --     Chemistries   Recent Labs Lab 08/27/13 2113 08/28/13 0849  NA 136* 142  K 4.3 3.7  CL 96 103  CO2 23 27  GLUCOSE 399* 80  BUN 13 13  CREATININE 0.98 0.82  CALCIUM 8.8 8.6  MG  --  1.7    CBG:  Recent Labs Lab 08/28/13 0049 08/28/13 0230 08/28/13 0517 08/28/13 0834  GLUCAP 387* 261* 110* 84    GFR The CrCl is unknown because both a height and weight (above a minimum accepted value) are required for this calculation.  Coagulation  profile No results found for this basename: INR, PROTIME,  in the last 168 hours  Cardiac Enzymes  Recent Labs Lab 08/28/13 0849  TROPONINI <0.30    No components found with this basename: POCBNP,  No results found for this basename: DDIMER,  in the last 72 hours No results found for this basename: HGBA1C,  in the last 72 hours  Recent Labs  08/28/13 0849  CHOL 224*  HDL 61  LDLCALC 142*  TRIG 103  CHOLHDL 3.7   No results found for this basename: TSH, T4TOTAL, FREET3, T3FREE, THYROIDAB,  in the last 72 hours No results found for this basename: VITAMINB12, FOLATE, FERRITIN, TIBC, IRON, RETICCTPCT,  in the last 72 hours No results found for this basename: LIPASE, AMYLASE,  in the last 72 hours  Urine Studies No results found for this basename: UACOL, UAPR, USPG, UPH, UTP, UGL, UKET, UBIL, UHGB, UNIT, UROB, ULEU, UEPI, UWBC, URBC, UBAC, CAST, CRYS, UCOM, BILUA,  in the last 72 hours  MICROBIOLOGY: No results found for this or any previous visit (from the past 240 hour(s)).  RADIOLOGY STUDIES/RESULTS: Dg Chest 2 View  08/27/2013   CLINICAL DATA:  Centralized chest pain with dizziness and headache. Hypertension.  EXAM: CHEST  2 VIEW  COMPARISON:  11/23/2012  FINDINGS: Lungs are adequately inflated without consolidation or effusion. Cardiomediastinal silhouette and remainder the exam is unchanged.  IMPRESSION: No active cardiopulmonary disease.   Electronically Signed   By: Marin Olp M.D.   On: 08/27/2013 21:56   Ct Head Wo Contrast  08/27/2013   CLINICAL DATA:  Chest pain and dizziness with headache and hypertension.  EXAM: CT HEAD WITHOUT CONTRAST  TECHNIQUE: Contiguous axial images were obtained from the base of the skull through the vertex without intravenous contrast.  COMPARISON:  07/08/2012 and 09/06/2007  FINDINGS: Ventricles, cisterns and other CSF spaces are within normal. There is no mass, mass effect, shift of midline structures or acute hemorrhage. There is no  evidence to suggest acute infarction. Bones a soft tissues are within normal.  IMPRESSION: No acute intracranial findings.   Electronically Signed   By: Marin Olp M.D.   On: 08/27/2013 21:55   Ct Angio Chest Aortic Dissect W &/or W/o  08/28/2013   CLINICAL DATA:  Chest pain.  Rule out aortic dissection.  EXAM: CT ANGIOGRAPHY CHEST, ABDOMEN AND PELVIS  TECHNIQUE: Multidetector CT imaging through the chest, abdomen and pelvis was performed using the standard protocol during bolus administration of intravenous contrast. Multiplanar reconstructed images and MIPs were obtained and reviewed to evaluate the vascular anatomy.  CONTRAST:  157mL OMNIPAQUE IOHEXOL 350 MG/ML SOLN  COMPARISON:  Chest CT 11/13/2012.  Abdomen pelvis CT 06/12/2011  FINDINGS: CTA  CHEST FINDINGS  THORACIC INLET/BODY WALL:  No acute abnormality.  MEDIASTINUM:  Normal heart size. No pericardial effusion. There is atherosclerosis, with the most notable plaque at the right vertebral artery origin. No acute vascular abnormality, including aortic dissection, aortic hematoma,or central pulmonary embolism. No adenopathy.  LUNG WINDOWS:  No consolidation.  No effusion.  No suspicious pulmonary nodule.  OSSEOUS:  No acute fracture.  No suspicious lytic or blastic lesions.  Review of the MIP images confirms the above findings.  CTA ABDOMEN AND PELVIS FINDINGS  BODY WALL: Unremarkable.  Liver: No focal abnormality.  Biliary: Cholecystectomy, which likely accounts for the mild enlargement of the distal common bile duct.  Pancreas: Unremarkable.  Spleen: Unremarkable.  Adrenals: Unremarkable.  Kidneys and ureters: Symmetric renal enhancement. There is mild renal cortical scarring/thinning to the upper poles. Presumed sub cm cyst in the lower pole right kidney. No hydronephrosis or nephrolithiasis.  Bladder: Unremarkable.  Reproductive: Hysterectomy and probable bilateral salpingo oophorectomy.  Bowel: No obstruction. Appendectomy.  Retroperitoneum: No mass  or adenopathy.  Peritoneum: No ascites or pneumoperitoneum.  Vascular: Standard aortic branching pattern. Mild atherosclerosis, most notable in the bilateral iliacs and at the right renal artery ostium. No aneurysm or dissection. Mild celiac axis narrowing which is likely from external compression. There is no collateral formation to suggest hemodynamic significance. No aortic wall thickening or periaortic inflammation.  OSSEOUS: No acute abnormalities.  Review of the MIP images confirms the above findings.  IMPRESSION: Negative for aortic aneurysm or dissection.   Electronically Signed   By: Jorje Guild M.D.   On: 08/28/2013 02:10   Ct Angio Abd/pel W/ And/or W/o  08/28/2013   CLINICAL DATA:  Chest pain.  Rule out aortic dissection.  EXAM: CT ANGIOGRAPHY CHEST, ABDOMEN AND PELVIS  TECHNIQUE: Multidetector CT imaging through the chest, abdomen and pelvis was performed using the standard protocol during bolus administration of intravenous contrast. Multiplanar reconstructed images and MIPs were obtained and reviewed to evaluate the vascular anatomy.  CONTRAST:  112mL OMNIPAQUE IOHEXOL 350 MG/ML SOLN  COMPARISON:  Chest CT 11/13/2012.  Abdomen pelvis CT 06/12/2011  FINDINGS: CTA CHEST FINDINGS  THORACIC INLET/BODY WALL:  No acute abnormality.  MEDIASTINUM:  Normal heart size. No pericardial effusion. There is atherosclerosis, with the most notable plaque at the right vertebral artery origin. No acute vascular abnormality, including aortic dissection, aortic hematoma,or central pulmonary embolism. No adenopathy.  LUNG WINDOWS:  No consolidation.  No effusion.  No suspicious pulmonary nodule.  OSSEOUS:  No acute fracture.  No suspicious lytic or blastic lesions.  Review of the MIP images confirms the above findings.  CTA ABDOMEN AND PELVIS FINDINGS  BODY WALL: Unremarkable.  Liver: No focal abnormality.  Biliary: Cholecystectomy, which likely accounts for the mild enlargement of the distal common bile duct.   Pancreas: Unremarkable.  Spleen: Unremarkable.  Adrenals: Unremarkable.  Kidneys and ureters: Symmetric renal enhancement. There is mild renal cortical scarring/thinning to the upper poles. Presumed sub cm cyst in the lower pole right kidney. No hydronephrosis or nephrolithiasis.  Bladder: Unremarkable.  Reproductive: Hysterectomy and probable bilateral salpingo oophorectomy.  Bowel: No obstruction. Appendectomy.  Retroperitoneum: No mass or adenopathy.  Peritoneum: No ascites or pneumoperitoneum.  Vascular: Standard aortic branching pattern. Mild atherosclerosis, most notable in the bilateral iliacs and at the right renal artery ostium. No aneurysm or dissection. Mild celiac axis narrowing which is likely from external compression. There is no collateral formation to suggest hemodynamic significance. No aortic wall thickening or periaortic inflammation.  OSSEOUS: No acute abnormalities.  Review of the MIP images confirms the above findings.  IMPRESSION: Negative for aortic aneurysm or dissection.   Electronically Signed   By: Jorje Guild M.D.   On: 08/28/2013 02:10    Oren Binet, MD  [facility] Pager:[phone]  If 7PM-7AM, please contact night-coverage [email or web address] Password [email or web address] 08/28/2013, 9:49 AM   LOS: 1 day   **Disclaimer: This note may have been dictated with voice recognition software. Similar sounding words can inadvertently be transcribed and this note may contain transcription errors which may not have been corrected upon publication of note.**

## 2013-08-28 NOTE — ED Notes (Addendum)
Pt up to b/r, steady gait, back to stretcher w/o incident or change, NAD, calm, pain lessened, mentions itching all over "not severe", no dyspnea or rash noted.

## 2013-08-28 NOTE — ED Notes (Addendum)
"  Itching is better, pain also decreased".

## 2013-08-28 NOTE — ED Notes (Signed)
Cardiology at bedside.

## 2013-08-28 NOTE — ED Notes (Signed)
Dr. Roel Cluck into room, EMT at [facility] for blood draw (2nd troponin).

## 2013-08-28 NOTE — Consult Note (Addendum)
CARDIOLOGY CONSULT NOTE     Patient ID: [name] [name] MRN: [id number] DOB/AGE: 52-Jul-1962 52 y.o.  Admit date: 08/27/2013 Referring Physician Oren Binet MD Primary Physician [name], MD Primary Cardiologist N/A Reason for Consultation chest pain.  HPI: 53 yo BF is seen at the request of the hospitalist service for evaluation of chest pain. She has a history of DM, HTN, and hypercholesterolemia. She states her pain started yesterday morning. It is described as a heavy weight or pressure on her chest. Localized to the left parasternal and breast region. Radiates into the back and left arm. No SOB, N/V, or diaphoresis. Pain is not aggravated by palpation, movement, cough, or meals. No clear aggravating or alleviating factors. Pain fairly constant since yesterday but waxes and wanes in intensity. She states this feels different than her GERD which is more burning. She does have a history of esophageal spasm on EGD treated with Botox injection in May 2015. Patient reports prior cardiac evaluation in [place] and [place] in the past. Apparently has had 2 heart caths in the past which showed no significant obstruction. She thinks the last one was 2 years ago. She denies history of tobacco use or family history of CAD.  Past Medical History  Diagnosis Date  . Diabetes mellitus without complication   . Hepatitis C   . Arthritis     hips  . GERD (gastroesophageal reflux disease)   . Hypertension   . Ulcer     hx gastric ulcers  . Esophageal spasm     Family History  Problem Relation Age of Onset  . Colon cancer Neg Hx   . Rectal cancer Neg Hx   . Stomach cancer Neg Hx   . Throat cancer Sister 64    died at age 28  . Alzheimer's disease Father   . Hypertension Mother     History   Social History  . Marital Status: Married    Spouse Name: keith    Number of Children: 2  . Years of Education: N/A   Occupational History  . machine operator    Social History Main Topics    . Smoking status: Never Smoker   . Smokeless tobacco: Never Used  . Alcohol Use: No  . Drug Use: No  . Sexual Activity: Not on file   Other Topics Concern  . Not on file   Social History Narrative  . No narrative on file    Past Surgical History  Procedure Laterality Date  . Cholecystectomy    . Abdominal hysterectomy    . Appendectomy    . Bone spur removal Right     heel  . Tonsillectomy    . Esophagogastroduodenoscopy N/A 12/29/2012    Procedure: ESOPHAGOGASTRODUODENOSCOPY (EGD);  Surgeon: Inda Castle, MD;  Location: [facility];  Service: Endoscopy;  Laterality: N/A;  . Botox injection  12/29/2012    Procedure: BOTOX INJECTION;  Surgeon: Inda Castle, MD;  Location: [facility];  Service: Endoscopy;;  . Balloon dilation N/A 12/29/2012    Procedure: Larrie Kass DILATION;  Surgeon: Inda Castle, MD;  Location: [facility];  Service: Endoscopy;  Laterality: N/A;  . Esophagogastroduodenoscopy N/A 06/25/2013    Procedure: ESOPHAGOGASTRODUODENOSCOPY (EGD);  Surgeon: Inda Castle, MD;  Location: [facility];  Service: Endoscopy;  Laterality: N/A;  . Balloon dilation N/A 06/25/2013    Procedure: BALLOON DILATION;  Surgeon: Inda Castle, MD;  Location: [facility];  Service: Endoscopy;  Laterality: N/A;  . Botox injection N/A  06/25/2013    Procedure: BOTOX INJECTION;  Surgeon: Inda Castle, MD;  Location: [facility];  Service: Endoscopy;  Laterality: N/A;       Medication List    ASK your doctor about these medications       DEXILANT 60 MG capsule  Generic drug:  dexlansoprazole  Take 60 mg by mouth daily.     glimepiride 4 MG tablet  Commonly known as:  AMARYL  Take 4 mg by mouth 2 (two) times daily.     labetalol 200 MG tablet  Commonly known as:  NORMODYNE  Take 200 mg by mouth 2 (two) times daily.     lisinopril 20 MG tablet  Commonly known as:  PRINIVIL,ZESTRIL  Take 20 mg by mouth 2 (two) times daily.     naproxen sodium 220 MG tablet   Commonly known as:  ANAPROX  Take 440 mg by mouth 2 (two) times daily with a meal.     nitroGLYCERIN 0.4 MG SL tablet  Commonly known as:  NITROSTAT  Place 0.4 mg under the tongue every 5 (five) minutes as needed for chest pain.     nitroGLYCERIN 0.2 mg/hr patch  Commonly known as:  NITRODUR - Dosed in mg/24 hr  Place 0.2 mg onto the skin daily.     omeprazole 40 MG capsule  Commonly known as:  PRILOSEC  Take 40 mg by mouth 2 (two) times daily.     sucralfate 1 GM/10ML suspension  Commonly known as:  CARAFATE  Take 10 mLs (1 g total) by mouth 4 (four) times daily -  with meals and at bedtime.         ROS: As noted in HPI. All other systems are reviewed and are negative unless otherwise mentioned.   Physical Exam: Blood pressure 129/70, pulse 69, temperature 98.1 F (36.7 C), temperature source Oral, resp. rate 15, weight 225 lb (102.059 kg), SpO2 93.00%. Current Weight  08/27/13 225 lb (102.059 kg)  07/17/13 225 lb 6.4 oz (102.241 kg)  06/18/13 227 lb 6 oz (103.137 kg)    GENERAL:  Well appearing overweight BF in mild distress.  HEENT:  PERRL, EOMI, sclera are clear. Oropharynx is clear. NECK:  No jugular venous distention, carotid upstroke brisk and symmetric, no bruits, no thyromegaly or adenopathy LUNGS:  Clear to auscultation bilaterally CHEST:  Unremarkable, no pain to palpation. HEART:  RRR,  PMI not displaced or sustained,S1 and S2 within normal limits, no S3, no S4: no clicks, no rubs, no murmurs ABD:  Soft, nontender. BS +, no masses or bruits. No hepatomegaly, no splenomegaly EXT:  2 + pulses throughout, no edema, no cyanosis no clubbing SKIN:  Warm and dry.  No rashes NEURO:  Alert and oriented x 3. Cranial nerves II through XII intact. PSYCH:  Cognitively intact    Labs:   Lab Results  Component Value Date   WBC 7.4 08/28/2013   HGB 11.6* 08/28/2013   HCT 33.0* 08/28/2013   MCV 77.8* 08/28/2013   PLT 185 08/28/2013    Recent Labs Lab 08/28/13 0849   NA 142  K 3.7  CL 103  CO2 27  BUN 13  CREATININE 0.82  CALCIUM 8.6  PROT 6.5  BILITOT 0.5  ALKPHOS 105  ALT 30  AST 29  GLUCOSE 80   Lab Results  Component Value Date   TROPONINI <0.30 08/28/2013    Lab Results  Component Value Date   CHOL 224* 08/28/2013   Lab Results  Component  Value Date   HDL 61 08/28/2013   Lab Results  Component Value Date   LDLCALC 142* 08/28/2013   Lab Results  Component Value Date   TRIG 103 08/28/2013   Lab Results  Component Value Date   CHOLHDL 3.7 08/28/2013   No results found for this basename: LDLDIRECT    No results found for this basename: PROBNP   No results found for this basename: TSH   No results found for this basename: HGBA1C    Radiology: Dg Chest 2 View  08/27/2013   CLINICAL DATA:  Centralized chest pain with dizziness and headache. Hypertension.  EXAM: CHEST  2 VIEW  COMPARISON:  11/23/2012  FINDINGS: Lungs are adequately inflated without consolidation or effusion. Cardiomediastinal silhouette and remainder the exam is unchanged.  IMPRESSION: No active cardiopulmonary disease.   Electronically Signed   By: Marin Olp M.D.   On: 08/27/2013 21:56   Ct Head Wo Contrast  08/27/2013   CLINICAL DATA:  Chest pain and dizziness with headache and hypertension.  EXAM: CT HEAD WITHOUT CONTRAST  TECHNIQUE: Contiguous axial images were obtained from the base of the skull through the vertex without intravenous contrast.  COMPARISON:  07/08/2012 and 09/06/2007  FINDINGS: Ventricles, cisterns and other CSF spaces are within normal. There is no mass, mass effect, shift of midline structures or acute hemorrhage. There is no evidence to suggest acute infarction. Bones a soft tissues are within normal.  IMPRESSION: No acute intracranial findings.   Electronically Signed   By: Marin Olp M.D.   On: 08/27/2013 21:55   Ct Angio Chest Aortic Dissect W &/or W/o  08/28/2013   CLINICAL DATA:  Chest pain.  Rule out aortic dissection.  EXAM: CT  ANGIOGRAPHY CHEST, ABDOMEN AND PELVIS  TECHNIQUE: Multidetector CT imaging through the chest, abdomen and pelvis was performed using the standard protocol during bolus administration of intravenous contrast. Multiplanar reconstructed images and MIPs were obtained and reviewed to evaluate the vascular anatomy.  CONTRAST:  168mL OMNIPAQUE IOHEXOL 350 MG/ML SOLN  COMPARISON:  Chest CT 11/13/2012.  Abdomen pelvis CT 06/12/2011  FINDINGS: CTA CHEST FINDINGS  THORACIC INLET/BODY WALL:  No acute abnormality.  MEDIASTINUM:  Normal heart size. No pericardial effusion. There is atherosclerosis, with the most notable plaque at the right vertebral artery origin. No acute vascular abnormality, including aortic dissection, aortic hematoma,or central pulmonary embolism. No adenopathy.  LUNG WINDOWS:  No consolidation.  No effusion.  No suspicious pulmonary nodule.  OSSEOUS:  No acute fracture.  No suspicious lytic or blastic lesions.  Review of the MIP images confirms the above findings.  CTA ABDOMEN AND PELVIS FINDINGS  BODY WALL: Unremarkable.  Liver: No focal abnormality.  Biliary: Cholecystectomy, which likely accounts for the mild enlargement of the distal common bile duct.  Pancreas: Unremarkable.  Spleen: Unremarkable.  Adrenals: Unremarkable.  Kidneys and ureters: Symmetric renal enhancement. There is mild renal cortical scarring/thinning to the upper poles. Presumed sub cm cyst in the lower pole right kidney. No hydronephrosis or nephrolithiasis.  Bladder: Unremarkable.  Reproductive: Hysterectomy and probable bilateral salpingo oophorectomy.  Bowel: No obstruction. Appendectomy.  Retroperitoneum: No mass or adenopathy.  Peritoneum: No ascites or pneumoperitoneum.  Vascular: Standard aortic branching pattern. Mild atherosclerosis, most notable in the bilateral iliacs and at the right renal artery ostium. No aneurysm or dissection. Mild celiac axis narrowing which is likely from external compression. There is no collateral  formation to suggest hemodynamic significance. No aortic wall thickening or periaortic inflammation.  OSSEOUS: No  acute abnormalities.  Review of the MIP images confirms the above findings.  IMPRESSION: Negative for aortic aneurysm or dissection.   Electronically Signed   By: Jorje Guild M.D.   On: 08/28/2013 02:10   Ct Angio Abd/pel W/ And/or W/o  08/28/2013   CLINICAL DATA:  Chest pain.  Rule out aortic dissection.  EXAM: CT ANGIOGRAPHY CHEST, ABDOMEN AND PELVIS  TECHNIQUE: Multidetector CT imaging through the chest, abdomen and pelvis was performed using the standard protocol during bolus administration of intravenous contrast. Multiplanar reconstructed images and MIPs were obtained and reviewed to evaluate the vascular anatomy.  CONTRAST:  157mL OMNIPAQUE IOHEXOL 350 MG/ML SOLN  COMPARISON:  Chest CT 11/13/2012.  Abdomen pelvis CT 06/12/2011  FINDINGS: CTA CHEST FINDINGS  THORACIC INLET/BODY WALL:  No acute abnormality.  MEDIASTINUM:  Normal heart size. No pericardial effusion. There is atherosclerosis, with the most notable plaque at the right vertebral artery origin. No acute vascular abnormality, including aortic dissection, aortic hematoma,or central pulmonary embolism. No adenopathy.  LUNG WINDOWS:  No consolidation.  No effusion.  No suspicious pulmonary nodule.  OSSEOUS:  No acute fracture.  No suspicious lytic or blastic lesions.  Review of the MIP images confirms the above findings.  CTA ABDOMEN AND PELVIS FINDINGS  BODY WALL: Unremarkable.  Liver: No focal abnormality.  Biliary: Cholecystectomy, which likely accounts for the mild enlargement of the distal common bile duct.  Pancreas: Unremarkable.  Spleen: Unremarkable.  Adrenals: Unremarkable.  Kidneys and ureters: Symmetric renal enhancement. There is mild renal cortical scarring/thinning to the upper poles. Presumed sub cm cyst in the lower pole right kidney. No hydronephrosis or nephrolithiasis.  Bladder: Unremarkable.  Reproductive:  Hysterectomy and probable bilateral salpingo oophorectomy.  Bowel: No obstruction. Appendectomy.  Retroperitoneum: No mass or adenopathy.  Peritoneum: No ascites or pneumoperitoneum.  Vascular: Standard aortic branching pattern. Mild atherosclerosis, most notable in the bilateral iliacs and at the right renal artery ostium. No aneurysm or dissection. Mild celiac axis narrowing which is likely from external compression. There is no collateral formation to suggest hemodynamic significance. No aortic wall thickening or periaortic inflammation.  OSSEOUS: No acute abnormalities.  Review of the MIP images confirms the above findings.  IMPRESSION: Negative for aortic aneurysm or dissection.   Electronically Signed   By: Jorje Guild M.D.   On: 08/28/2013 02:10    EKG: Multiple Ecgs show NSR with nonspecific TWA. Diffuse T wave flattening and T wave inversion in lateral leads.  ASSESSMENT AND PLAN:  1. Acute chest pain with typical and atypical features. Multiple cardiac risk factors. Prior cardiac evaluation in [facility] apparently showed no obstructive disease. Will attempt to get records. The fact that her troponins are negative despite prolonged chest pain is reassuring. Reviewing her Chest CT I see no coronary calcification. Given her risk factors, however, we should reevaluate her cardiac status and will plan on stress Myoview in am. Echo ordered.  2. HTN urgency. Improving. Transition to po meds as outlined by hospitalist.  3. Hypercholesterolemia. She should be on statin therapy with her DM  4. DM poorly controlled.  5. Hepatitis C  Signed: Peter Martin[place], [place]  08/28/2013, 11:09 AM   Addendum: Records received from [facility]. Patient had cardiac caths there in May 2009,  Jan 2011 and February 2012. These showed tapering of the distal LAD but no significant CAD. EF normal.   Peter Martinique MD, [facility]

## 2013-08-28 NOTE — ED Notes (Signed)
Dr. Sloan Leiter contacted regarding PO BP meds ordered and possible downgrade to tele. BP remains WNL & lower. Lisinopril and labetolol held. New prn meds ordered.

## 2013-08-28 NOTE — ED Notes (Signed)
Back from CT, no changes, alert, NAD, calm, interactive, no dyspnea noted.

## 2013-08-29 ENCOUNTER — Inpatient Hospital Stay (HOSPITAL_COMMUNITY): Payer: 59

## 2013-08-29 DIAGNOSIS — I319 Disease of pericardium, unspecified: Secondary | ICD-10-CM

## 2013-08-29 DIAGNOSIS — R9431 Abnormal electrocardiogram [ECG] [EKG]: Secondary | ICD-10-CM

## 2013-08-29 DIAGNOSIS — R079 Chest pain, unspecified: Secondary | ICD-10-CM

## 2013-08-29 HISTORY — DX: Abnormal electrocardiogram (ECG) (EKG): R94.31

## 2013-08-29 LAB — URINE CULTURE
Colony Count: 60000
SPECIAL REQUESTS: NORMAL

## 2013-08-29 LAB — GLUCOSE, CAPILLARY
GLUCOSE-CAPILLARY: 144 mg/dL — AB (ref 70–99)
GLUCOSE-CAPILLARY: 269 mg/dL — AB (ref 70–99)
GLUCOSE-CAPILLARY: 290 mg/dL — AB (ref 70–99)
GLUCOSE-CAPILLARY: 309 mg/dL — AB (ref 70–99)
GLUCOSE-CAPILLARY: 314 mg/dL — AB (ref 70–99)
Glucose-Capillary: 191 mg/dL — ABNORMAL HIGH (ref 70–99)

## 2013-08-29 MED ORDER — TECHNETIUM TC 99M SESTAMIBI GENERIC - CARDIOLITE
10.0000 | Freq: Once | INTRAVENOUS | Status: AC | PRN
  Administered 2013-08-29: 10 via INTRAVENOUS

## 2013-08-29 MED ORDER — DIPHENHYDRAMINE HCL 50 MG/ML IJ SOLN
12.5000 mg | Freq: Once | INTRAMUSCULAR | Status: AC
  Administered 2013-08-29: 12.5 mg via INTRAVENOUS

## 2013-08-29 MED ORDER — HYDROCODONE-ACETAMINOPHEN 5-325 MG PO TABS
ORAL_TABLET | ORAL | Status: AC
  Filled 2013-08-29: qty 1

## 2013-08-29 MED ORDER — DILTIAZEM HCL ER COATED BEADS 180 MG PO CP24
180.0000 mg | ORAL_CAPSULE | Freq: Every day | ORAL | Status: DC
  Administered 2013-08-29 – 2013-08-30 (×2): 180 mg via ORAL
  Filled 2013-08-29 (×2): qty 1

## 2013-08-29 MED ORDER — REGADENOSON 0.4 MG/5ML IV SOLN
INTRAVENOUS | Status: AC
  Filled 2013-08-29: qty 5

## 2013-08-29 MED ORDER — CHLORHEXIDINE GLUCONATE 0.12 % MT SOLN
15.0000 mL | Freq: Two times a day (BID) | OROMUCOSAL | Status: DC
  Administered 2013-08-29 – 2013-08-30 (×3): 15 mL via OROMUCOSAL
  Filled 2013-08-29 (×6): qty 15

## 2013-08-29 MED ORDER — BIOTENE DRY MOUTH MT LIQD
15.0000 mL | Freq: Two times a day (BID) | OROMUCOSAL | Status: DC
  Administered 2013-08-29 (×2): 15 mL via OROMUCOSAL

## 2013-08-29 MED ORDER — NITROGLYCERIN 0.2 MG/HR TD PT24
0.2000 mg | MEDICATED_PATCH | Freq: Every day | TRANSDERMAL | Status: DC
  Administered 2013-08-29 – 2013-08-30 (×2): 0.2 mg via TRANSDERMAL
  Filled 2013-08-29 (×3): qty 1

## 2013-08-29 MED ORDER — REGADENOSON 0.4 MG/5ML IV SOLN
0.4000 mg | Freq: Once | INTRAVENOUS | Status: AC
  Administered 2013-08-29: 0.4 mg via INTRAVENOUS
  Filled 2013-08-29: qty 5

## 2013-08-29 MED ORDER — TECHNETIUM TC 99M SESTAMIBI GENERIC - CARDIOLITE
30.0000 | Freq: Once | INTRAVENOUS | Status: AC | PRN
  Administered 2013-08-29: 30 via INTRAVENOUS

## 2013-08-29 MED ORDER — KETOROLAC TROMETHAMINE 30 MG/ML IJ SOLN
30.0000 mg | Freq: Three times a day (TID) | INTRAMUSCULAR | Status: DC | PRN
  Administered 2013-08-29: 30 mg via INTRAVENOUS
  Filled 2013-08-29: qty 1

## 2013-08-29 NOTE — Progress Notes (Signed)
  Echocardiogram 2D Echocardiogram has been performed.  [name] [name] 08/29/2013, 5:09 PM

## 2013-08-29 NOTE — Progress Notes (Signed)
PATIENT DETAILS Name: [name] [name] Age: 53 y.o. Sex: female Date of Birth: 1960/12/16 Admit Date: 08/27/2013 Admitting Physician Toy Baker, MD [id number], MD  Subjective: Continues to have episodes of chest pain-"pressure like" throughout the night  Assessment/Plan: Active Problems: Hypertensive Urgency -BP controlled, continue with labetalol and Lisinopril  Chest Pain -although has a hx of esophageal spasms-claims this current pain is "different"-pressure like radiating to the back and to the left arm.CT Angio of the chest-negative for aortic dissection or central Pul Embolism. EKG showed T wave inversion in v4-v6-given risk factors-DM,HTN,Dyslipidemia- consulted cardiology.-for Nuclear stress test today.Troponin negative so far.  Hx of Esophageal Dysmotility -see above-regarding chest pain -c/w Carafate, PPI-if cardiac eval negative-may need GI eval. Resume Nitro transdermally  DM -CBG's stable -c/w SSI  Disposition: Remain inpatient  DVT Prophylaxis: Prophylactic Lovenox  Code Status: Full code   Family Communication Sister at bedside  Procedures:  None  CONSULTS:  cardiology   MEDICATIONS: Scheduled Meds: . antiseptic oral rinse  15 mL Mouth Rinse q12n4p  . aspirin EC  81 mg Oral Daily  . atorvastatin  40 mg Oral q1800  . chlorhexidine  15 mL Mouth Rinse BID  . docusate sodium  100 mg Oral BID  . enoxaparin (LOVENOX) injection  40 mg Subcutaneous Q24H  . insulin aspart  0-9 Units Subcutaneous 6 times per day  . labetalol  200 mg Oral BID  . lisinopril  20 mg Oral BID  . pantoprazole (PROTONIX) IV  40 mg Intravenous Q12H  . pneumococcal 23 valent vaccine  0.5 mL Intramuscular Tomorrow-1000  . senna  1 tablet Oral BID  . sodium chloride  3 mL Intravenous Q12H  . sodium chloride  3 mL Intravenous Q12H  . sucralfate  1 g Oral TID WC & HS   Continuous Infusions:   PRN Meds:.sodium chloride, acetaminophen, acetaminophen,  diphenhydrAMINE, hydrALAZINE, HYDROcodone-acetaminophen, morphine injection, nitroGLYCERIN, ondansetron (ZOFRAN) IV, ondansetron, polyethylene glycol, sodium chloride  Antibiotics: Anti-infectives   None       PHYSICAL EXAM: Vital signs in last 24 hours: Filed Vitals:   08/29/13 0500 08/29/13 0600 08/29/13 0700 08/29/13 0718  BP: 117/70 111/73 124/72 125/77  Pulse: 71 69 65 65  Temp:    98.1 F (36.7 C)  TempSrc:    Oral  Resp: 14 14 13 15   Height:      Weight:      SpO2: 98% 97% 98% 97%    Weight change: 0.141 kg (5 oz) Filed Weights   08/27/13 2119 08/28/13 1315  Weight: 102.059 kg (225 lb) 102.2 kg (225 lb 5 oz)   Body mass index is 37.49 kg/(m^2).   Gen Exam: Awake and alert with clear speech.   Neck: Supple, No JVD.   Chest: B/L Clear.   CVS: S1 S2 Regular, no murmurs. Pain is reproducible today at left chest area. Abdomen: soft, BS +, non tender, non distended.  Extremities: no edema, lower extremities warm to touch. Neurologic: Non Focal.   Skin: No Rash.   Wounds: N/A.    Intake/Output from previous day:  Intake/Output Summary (Last 24 hours) at 08/29/13 0734 Last data filed at 08/29/13 0721  Gross per 24 hour  Intake      3 ml  Output    750 ml  Net   -747 ml     LAB RESULTS: CBC  Recent Labs Lab 08/27/13 2113 08/28/13 0849  WBC 8.2 7.4  HGB 12.3 11.6*  HCT  34.5* 33.0*  PLT 185 185  MCV 77.2* 77.8*  MCH 27.5 27.4  MCHC 35.7 35.2  RDW 13.3 13.3  LYMPHSABS 3.2  --   MONOABS 0.5  --   EOSABS 0.2  --   BASOSABS 0.0  --     Chemistries   Recent Labs Lab 08/27/13 2113 08/28/13 0849  NA 136* 142  K 4.3 3.7  CL 96 103  CO2 23 27  GLUCOSE 399* 80  BUN 13 13  CREATININE 0.98 0.82  CALCIUM 8.8 8.6  MG  --  1.7    CBG:  Recent Labs Lab 08/28/13 1137 08/28/13 1419 08/28/13 1703 08/28/13 2050 08/29/13 0045  GLUCAP 95 185* 160* 98 314*    GFR Estimated Creatinine Clearance: 95.1 ml/min (by C-G formula based on Cr of  0.82).  Coagulation profile No results found for this basename: INR, PROTIME,  in the last 168 hours  Cardiac Enzymes  Recent Labs Lab 08/28/13 0849 08/28/13 1515 08/28/13 2025  TROPONINI <0.30 <0.30 <0.30    No components found with this basename: POCBNP,  No results found for this basename: DDIMER,  in the last 72 hours  Recent Labs  08/28/13 0849  HGBA1C 12.2*    Recent Labs  08/28/13 0849  CHOL 224*  HDL 61  LDLCALC 142*  TRIG 103  CHOLHDL 3.7    Recent Labs  08/28/13 0849  TSH 2.180   No results found for this basename: VITAMINB12, FOLATE, FERRITIN, TIBC, IRON, RETICCTPCT,  in the last 72 hours No results found for this basename: LIPASE, AMYLASE,  in the last 72 hours  Urine Studies No results found for this basename: UACOL, UAPR, USPG, UPH, UTP, UGL, UKET, UBIL, UHGB, UNIT, UROB, ULEU, UEPI, UWBC, URBC, UBAC, CAST, CRYS, UCOM, BILUA,  in the last 72 hours  MICROBIOLOGY: Recent Results (from the past 240 hour(s))  MRSA PCR SCREENING     Status: None   Collection Time    08/28/13  1:38 PM      Result Value Ref Range Status   MRSA by PCR NEGATIVE  NEGATIVE Final   Comment:            The GeneXpert MRSA Assay (FDA     approved for NASAL specimens     only), is one component of a     comprehensive MRSA colonization     surveillance program. It is not     intended to diagnose MRSA     infection nor to guide or     monitor treatment for     MRSA infections.    RADIOLOGY STUDIES/RESULTS: Dg Chest 2 View  08/27/2013   CLINICAL DATA:  Centralized chest pain with dizziness and headache. Hypertension.  EXAM: CHEST  2 VIEW  COMPARISON:  11/23/2012  FINDINGS: Lungs are adequately inflated without consolidation or effusion. Cardiomediastinal silhouette and remainder the exam is unchanged.  IMPRESSION: No active cardiopulmonary disease.   Electronically Signed   By: Marin Olp M.D.   On: 08/27/2013 21:56   Ct Head Wo Contrast  08/27/2013   CLINICAL DATA:   Chest pain and dizziness with headache and hypertension.  EXAM: CT HEAD WITHOUT CONTRAST  TECHNIQUE: Contiguous axial images were obtained from the base of the skull through the vertex without intravenous contrast.  COMPARISON:  07/08/2012 and 09/06/2007  FINDINGS: Ventricles, cisterns and other CSF spaces are within normal. There is no mass, mass effect, shift of midline structures or acute hemorrhage. There is no evidence to suggest  acute infarction. Bones a soft tissues are within normal.  IMPRESSION: No acute intracranial findings.   Electronically Signed   By: Marin Olp M.D.   On: 08/27/2013 21:55   Ct Angio Chest Aortic Dissect W &/or W/o  08/28/2013   CLINICAL DATA:  Chest pain.  Rule out aortic dissection.  EXAM: CT ANGIOGRAPHY CHEST, ABDOMEN AND PELVIS  TECHNIQUE: Multidetector CT imaging through the chest, abdomen and pelvis was performed using the standard protocol during bolus administration of intravenous contrast. Multiplanar reconstructed images and MIPs were obtained and reviewed to evaluate the vascular anatomy.  CONTRAST:  122mL OMNIPAQUE IOHEXOL 350 MG/ML SOLN  COMPARISON:  Chest CT 11/13/2012.  Abdomen pelvis CT 06/12/2011  FINDINGS: CTA CHEST FINDINGS  THORACIC INLET/BODY WALL:  No acute abnormality.  MEDIASTINUM:  Normal heart size. No pericardial effusion. There is atherosclerosis, with the most notable plaque at the right vertebral artery origin. No acute vascular abnormality, including aortic dissection, aortic hematoma,or central pulmonary embolism. No adenopathy.  LUNG WINDOWS:  No consolidation.  No effusion.  No suspicious pulmonary nodule.  OSSEOUS:  No acute fracture.  No suspicious lytic or blastic lesions.  Review of the MIP images confirms the above findings.  CTA ABDOMEN AND PELVIS FINDINGS  BODY WALL: Unremarkable.  Liver: No focal abnormality.  Biliary: Cholecystectomy, which likely accounts for the mild enlargement of the distal common bile duct.  Pancreas: Unremarkable.   Spleen: Unremarkable.  Adrenals: Unremarkable.  Kidneys and ureters: Symmetric renal enhancement. There is mild renal cortical scarring/thinning to the upper poles. Presumed sub cm cyst in the lower pole right kidney. No hydronephrosis or nephrolithiasis.  Bladder: Unremarkable.  Reproductive: Hysterectomy and probable bilateral salpingo oophorectomy.  Bowel: No obstruction. Appendectomy.  Retroperitoneum: No mass or adenopathy.  Peritoneum: No ascites or pneumoperitoneum.  Vascular: Standard aortic branching pattern. Mild atherosclerosis, most notable in the bilateral iliacs and at the right renal artery ostium. No aneurysm or dissection. Mild celiac axis narrowing which is likely from external compression. There is no collateral formation to suggest hemodynamic significance. No aortic wall thickening or periaortic inflammation.  OSSEOUS: No acute abnormalities.  Review of the MIP images confirms the above findings.  IMPRESSION: Negative for aortic aneurysm or dissection.   Electronically Signed   By: Jorje Guild M.D.   On: 08/28/2013 02:10   Ct Angio Abd/pel W/ And/or W/o  08/28/2013   CLINICAL DATA:  Chest pain.  Rule out aortic dissection.  EXAM: CT ANGIOGRAPHY CHEST, ABDOMEN AND PELVIS  TECHNIQUE: Multidetector CT imaging through the chest, abdomen and pelvis was performed using the standard protocol during bolus administration of intravenous contrast. Multiplanar reconstructed images and MIPs were obtained and reviewed to evaluate the vascular anatomy.  CONTRAST:  111mL OMNIPAQUE IOHEXOL 350 MG/ML SOLN  COMPARISON:  Chest CT 11/13/2012.  Abdomen pelvis CT 06/12/2011  FINDINGS: CTA CHEST FINDINGS  THORACIC INLET/BODY WALL:  No acute abnormality.  MEDIASTINUM:  Normal heart size. No pericardial effusion. There is atherosclerosis, with the most notable plaque at the right vertebral artery origin. No acute vascular abnormality, including aortic dissection, aortic hematoma,or central pulmonary embolism. No  adenopathy.  LUNG WINDOWS:  No consolidation.  No effusion.  No suspicious pulmonary nodule.  OSSEOUS:  No acute fracture.  No suspicious lytic or blastic lesions.  Review of the MIP images confirms the above findings.  CTA ABDOMEN AND PELVIS FINDINGS  BODY WALL: Unremarkable.  Liver: No focal abnormality.  Biliary: Cholecystectomy, which likely accounts for the mild enlargement of the distal  common bile duct.  Pancreas: Unremarkable.  Spleen: Unremarkable.  Adrenals: Unremarkable.  Kidneys and ureters: Symmetric renal enhancement. There is mild renal cortical scarring/thinning to the upper poles. Presumed sub cm cyst in the lower pole right kidney. No hydronephrosis or nephrolithiasis.  Bladder: Unremarkable.  Reproductive: Hysterectomy and probable bilateral salpingo oophorectomy.  Bowel: No obstruction. Appendectomy.  Retroperitoneum: No mass or adenopathy.  Peritoneum: No ascites or pneumoperitoneum.  Vascular: Standard aortic branching pattern. Mild atherosclerosis, most notable in the bilateral iliacs and at the right renal artery ostium. No aneurysm or dissection. Mild celiac axis narrowing which is likely from external compression. There is no collateral formation to suggest hemodynamic significance. No aortic wall thickening or periaortic inflammation.  OSSEOUS: No acute abnormalities.  Review of the MIP images confirms the above findings.  IMPRESSION: Negative for aortic aneurysm or dissection.   Electronically Signed   By: Jorje Guild M.D.   On: 08/28/2013 02:10    Oren Binet, MD  [facility] Pager:[phone]  If 7PM-7AM, please contact night-coverage [email or web address] Password [email or web address] 08/29/2013, 7:34 AM   LOS: 2 days   **Disclaimer: This note may have been dictated with voice recognition software. Similar sounding words can inadvertently be transcribed and this note may contain transcription errors which may not have been corrected upon publication of note.**

## 2013-08-29 NOTE — Progress Notes (Signed)
    Subjective:  Pleuritic chest pain to her back and Lt arm  Objective:  Vital Signs in the last 24 hours: Temp:  [97.7 F (36.5 C)-98.4 F (36.9 C)] 98.1 F (36.7 C) (07/18 0718) Pulse Rate:  [64-76] 69 (07/18 1049) Resp:  [13-18] 18 (07/18 1049) BP: (104-158)/(53-98) 158/65 mmHg (07/18 1102) SpO2:  [90 %-99 %] 98 % (07/18 1049) Weight:  [225 lb 5 oz (102.2 kg)] 225 lb 5 oz (102.2 kg) (07/17 1315)  Intake/Output from previous day:  Intake/Output Summary (Last 24 hours) at 08/29/13 1104 Last data filed at 08/29/13 [id number]  Gross per 24 hour  Intake      3 ml  Output    750 ml  Net   -747 ml    Physical Exam: General appearance: alert, cooperative, no distress and mildly obese Lungs: clear to auscultation bilaterally Heart: regular rate and rhythm   Rate: 70  Rhythm: normal sinus rhythm  Lab Results:  Recent Labs  08/27/13 2113 08/28/13 0849  WBC 8.2 7.4  HGB 12.3 11.6*  PLT 185 185    Recent Labs  08/27/13 2113 08/28/13 0849  NA 136* 142  K 4.3 3.7  CL 96 103  CO2 23 27  GLUCOSE 399* 80  BUN 13 13  CREATININE 0.98 0.82    Recent Labs  08/28/13 1515 08/28/13 2025  TROPONINI <0.30 <0.30   No results found for this basename: INR,  in the last 72 hours  Imaging: Imaging results have been reviewed  Cardiac Studies:  Assessment/Plan:   Active Problems:   Chest pain   DIABETES MELLITUS-TYPE II   Hypertensive urgency   HEPATITIS C, CHRONIC   Dyskinesia of esophagus    PLAN: Myoview ordered. Three prior caths showing no significant CAD.  Kerin Ransom PA-C [facility]per [id number] 08/29/2013, 11:04 AM

## 2013-08-29 NOTE — Progress Notes (Signed)
Patient seen and examined and agree with note as outlined by Kerin Ransom PA-C.  Await results of nuclear stress test.

## 2013-08-30 DIAGNOSIS — K222 Esophageal obstruction: Secondary | ICD-10-CM

## 2013-08-30 LAB — GLUCOSE, CAPILLARY
GLUCOSE-CAPILLARY: 251 mg/dL — AB (ref 70–99)
Glucose-Capillary: 234 mg/dL — ABNORMAL HIGH (ref 70–99)
Glucose-Capillary: 281 mg/dL — ABNORMAL HIGH (ref 70–99)

## 2013-08-30 MED ORDER — DILTIAZEM HCL ER COATED BEADS 180 MG PO CP24: 180.0000 mg | ORAL_CAPSULE | Freq: Every day | ORAL | Status: DC

## 2013-08-30 MED ORDER — HYDROCODONE-ACETAMINOPHEN 5-325 MG PO TABS: 1.0000 | ORAL_TABLET | Freq: Four times a day (QID) | ORAL | Status: DC | PRN

## 2013-08-30 MED ORDER — ASPIRIN 81 MG PO TBEC: 81.0000 mg | DELAYED_RELEASE_TABLET | Freq: Every day | ORAL | Status: DC

## 2013-08-30 NOTE — Progress Notes (Signed)
     Myoview is negative. I note several previous caths were negative for significant CAd Troponin levels are negative  Will sign off.  Thayer Headings, Brooke Bonito., MD, [facility] 08/30/2013, 10:16 AM [address],  [place] Pager [phone]

## 2013-08-30 NOTE — Discharge Summary (Signed)
PATIENT DETAILS Name: [name] [name] Age: 53 y.o. Sex: female Date of Birth: May 15, 1960 MRN: [id number]. Admit Date: 08/27/2013 Admitting Physician: Toy Baker, MD [id number], MD  Recommendations for Outpatient Follow-up:  1. Needs follow up with GI for management of esophageal spasm  PRIMARY DISCHARGE DIAGNOSIS:  Active Problems:   HEPATITIS C, CHRONIC   DIABETES MELLITUS-TYPE II   Dyskinesia of esophagus   Hypertensive urgency   Chest pain   Abnormal EKG      PAST MEDICAL HISTORY: Past Medical History  Diagnosis Date  . GERD (gastroesophageal reflux disease)   . Hypertension   . Ulcer     hx gastric ulcers  . Esophageal spasm   . Hepatitis C   . High cholesterol   . Pneumonia     "several times"  . Type II diabetes mellitus   . Migraine     "monthly" (08/28/2013)  . Arthritis     "hips" (08/28/2013)  . Chronic lower back pain   . Anxiety   . Depression   . Frequent UTI     DISCHARGE MEDICATIONS:   Medication List    STOP taking these medications       labetalol 200 MG tablet  Commonly known as:  NORMODYNE      TAKE these medications       aspirin 81 MG EC tablet  Take 1 tablet (81 mg total) by mouth daily.     DEXILANT 60 MG capsule  Generic drug:  dexlansoprazole  Take 60 mg by mouth daily.     diltiazem 180 MG 24 hr capsule  Commonly known as:  CARDIZEM CD  Take 1 capsule (180 mg total) by mouth daily.     glimepiride 4 MG tablet  Commonly known as:  AMARYL  Take 4 mg by mouth 2 (two) times daily.     HYDROcodone-acetaminophen 5-325 MG per tablet  Commonly known as:  NORCO/VICODIN  Take 1-2 tablets by mouth every 6 (six) hours as needed for moderate pain.     lisinopril 20 MG tablet  Commonly known as:  PRINIVIL,ZESTRIL  Take 20 mg by mouth 2 (two) times daily.     naproxen sodium 220 MG tablet  Commonly known as:  ANAPROX  Take 440 mg by mouth 2 (two) times daily with a meal.     nitroGLYCERIN 0.4 MG SL tablet    Commonly known as:  NITROSTAT  Place 0.4 mg under the tongue every 5 (five) minutes as needed for chest pain.     nitroGLYCERIN 0.2 mg/hr patch  Commonly known as:  NITRODUR - Dosed in mg/24 hr  Place 0.2 mg onto the skin daily.     omeprazole 40 MG capsule  Commonly known as:  PRILOSEC  Take 40 mg by mouth 2 (two) times daily.     sucralfate 1 GM/10ML suspension  Commonly known as:  CARAFATE  Take 10 mLs (1 g total) by mouth 4 (four) times daily -  with meals and at bedtime.        ALLERGIES:   Allergies  Allergen Reactions  . Codeine Itching    No hives or rash, takes benadryl  . Dilaudid [Hydromorphone Hcl] Itching    "mild itching hrs after dilaudid given"    BRIEF HPI:  See H&P, Labs, Consult and Test reports for all details in brief, patient was admitted for evaluation of chest pain.  CONSULTATIONS:   cardiology  PERTINENT RADIOLOGIC STUDIES: Dg Chest 2 View  08/27/2013  CLINICAL DATA:  Centralized chest pain with dizziness and headache. Hypertension.  EXAM: CHEST  2 VIEW  COMPARISON:  11/23/2012  FINDINGS: Lungs are adequately inflated without consolidation or effusion. Cardiomediastinal silhouette and remainder the exam is unchanged.  IMPRESSION: No active cardiopulmonary disease.   Electronically Signed   By: Marin Olp M.D.   On: 08/27/2013 21:56   Ct Head Wo Contrast  08/27/2013   CLINICAL DATA:  Chest pain and dizziness with headache and hypertension.  EXAM: CT HEAD WITHOUT CONTRAST  TECHNIQUE: Contiguous axial images were obtained from the base of the skull through the vertex without intravenous contrast.  COMPARISON:  07/08/2012 and 09/06/2007  FINDINGS: Ventricles, cisterns and other CSF spaces are within normal. There is no mass, mass effect, shift of midline structures or acute hemorrhage. There is no evidence to suggest acute infarction. Bones a soft tissues are within normal.  IMPRESSION: No acute intracranial findings.   Electronically Signed   By:  Marin Olp M.D.   On: 08/27/2013 21:55   Nm Myocar Multi W/spect W/wall Motion / Ef  08/29/2013   CLINICAL DATA:  53 year old with current history hypertension, hypercholesterolemia, diabetes, and hepatitis-C, presenting with chest pain.  EXAM: MYOCARDIAL IMAGING WITH SPECT (REST AND PHARMACOLOGIC-STRESS)  GATED LEFT VENTRICULAR WALL MOTION STUDY  LEFT VENTRICULAR EJECTION FRACTION  TECHNIQUE: Standard myocardial SPECT imaging was performed after resting intravenous injection of 10 mCi Tc-44m sestamibi. Subsequently, intravenous infusion of Lexiscan was performed under the supervision of the Cardiology staff. At peak effect of the drug, 30 mCi Tc-28m sestamibi was injected intravenously and standard myocardial SPECT imaging was performed. Quantitative gated imaging was also performed to evaluate left ventricular wall motion, and estimate left ventricular ejection fraction.  COMPARISON:  None.  FINDINGS: Immediate post regadenoson images demonstrate slight diminished uptake in the anteroapical wall which can be explained on the basis of breast attenuation. Initial resting images demonstrate similar findings. No evidence of reversibility to suggest ischemia. Findings confirmed by the computer generated polar map.  Gated images demonstrate satisfactory thickening throughout the left ventricular myocardium with normal wall motion throughout.  Estimated QGS left ventricular ejection fraction measured 71%, with an end-diastolic volume of 69 ml and an end systolic volume of 20 ml.  IMPRESSION: 1. No evidence of myocardial ischemia or infarction. 2. Normal left ventricular wall motion. 3. Estimated QGS ejection fraction 71%.   Electronically Signed   By: Evangeline Dakin M.D.   On: 08/29/2013 12:51   Ct Angio Chest Aortic Dissect W &/or W/o  08/28/2013   CLINICAL DATA:  Chest pain.  Rule out aortic dissection.  EXAM: CT ANGIOGRAPHY CHEST, ABDOMEN AND PELVIS  TECHNIQUE: Multidetector CT imaging through the chest,  abdomen and pelvis was performed using the standard protocol during bolus administration of intravenous contrast. Multiplanar reconstructed images and MIPs were obtained and reviewed to evaluate the vascular anatomy.  CONTRAST:  165mL OMNIPAQUE IOHEXOL 350 MG/ML SOLN  COMPARISON:  Chest CT 11/13/2012.  Abdomen pelvis CT 06/12/2011  FINDINGS: CTA CHEST FINDINGS  THORACIC INLET/BODY WALL:  No acute abnormality.  MEDIASTINUM:  Normal heart size. No pericardial effusion. There is atherosclerosis, with the most notable plaque at the right vertebral artery origin. No acute vascular abnormality, including aortic dissection, aortic hematoma,or central pulmonary embolism. No adenopathy.  LUNG WINDOWS:  No consolidation.  No effusion.  No suspicious pulmonary nodule.  OSSEOUS:  No acute fracture.  No suspicious lytic or blastic lesions.  Review of the MIP images confirms the above findings.  CTA ABDOMEN AND PELVIS FINDINGS  BODY WALL: Unremarkable.  Liver: No focal abnormality.  Biliary: Cholecystectomy, which likely accounts for the mild enlargement of the distal common bile duct.  Pancreas: Unremarkable.  Spleen: Unremarkable.  Adrenals: Unremarkable.  Kidneys and ureters: Symmetric renal enhancement. There is mild renal cortical scarring/thinning to the upper poles. Presumed sub cm cyst in the lower pole right kidney. No hydronephrosis or nephrolithiasis.  Bladder: Unremarkable.  Reproductive: Hysterectomy and probable bilateral salpingo oophorectomy.  Bowel: No obstruction. Appendectomy.  Retroperitoneum: No mass or adenopathy.  Peritoneum: No ascites or pneumoperitoneum.  Vascular: Standard aortic branching pattern. Mild atherosclerosis, most notable in the bilateral iliacs and at the right renal artery ostium. No aneurysm or dissection. Mild celiac axis narrowing which is likely from external compression. There is no collateral formation to suggest hemodynamic significance. No aortic wall thickening or periaortic  inflammation.  OSSEOUS: No acute abnormalities.  Review of the MIP images confirms the above findings.  IMPRESSION: Negative for aortic aneurysm or dissection.   Electronically Signed   By: Jorje Guild M.D.   On: 08/28/2013 02:10   Ct Angio Abd/pel W/ And/or W/o  08/28/2013   CLINICAL DATA:  Chest pain.  Rule out aortic dissection.  EXAM: CT ANGIOGRAPHY CHEST, ABDOMEN AND PELVIS  TECHNIQUE: Multidetector CT imaging through the chest, abdomen and pelvis was performed using the standard protocol during bolus administration of intravenous contrast. Multiplanar reconstructed images and MIPs were obtained and reviewed to evaluate the vascular anatomy.  CONTRAST:  177mL OMNIPAQUE IOHEXOL 350 MG/ML SOLN  COMPARISON:  Chest CT 11/13/2012.  Abdomen pelvis CT 06/12/2011  FINDINGS: CTA CHEST FINDINGS  THORACIC INLET/BODY WALL:  No acute abnormality.  MEDIASTINUM:  Normal heart size. No pericardial effusion. There is atherosclerosis, with the most notable plaque at the right vertebral artery origin. No acute vascular abnormality, including aortic dissection, aortic hematoma,or central pulmonary embolism. No adenopathy.  LUNG WINDOWS:  No consolidation.  No effusion.  No suspicious pulmonary nodule.  OSSEOUS:  No acute fracture.  No suspicious lytic or blastic lesions.  Review of the MIP images confirms the above findings.  CTA ABDOMEN AND PELVIS FINDINGS  BODY WALL: Unremarkable.  Liver: No focal abnormality.  Biliary: Cholecystectomy, which likely accounts for the mild enlargement of the distal common bile duct.  Pancreas: Unremarkable.  Spleen: Unremarkable.  Adrenals: Unremarkable.  Kidneys and ureters: Symmetric renal enhancement. There is mild renal cortical scarring/thinning to the upper poles. Presumed sub cm cyst in the lower pole right kidney. No hydronephrosis or nephrolithiasis.  Bladder: Unremarkable.  Reproductive: Hysterectomy and probable bilateral salpingo oophorectomy.  Bowel: No obstruction.  Appendectomy.  Retroperitoneum: No mass or adenopathy.  Peritoneum: No ascites or pneumoperitoneum.  Vascular: Standard aortic branching pattern. Mild atherosclerosis, most notable in the bilateral iliacs and at the right renal artery ostium. No aneurysm or dissection. Mild celiac axis narrowing which is likely from external compression. There is no collateral formation to suggest hemodynamic significance. No aortic wall thickening or periaortic inflammation.  OSSEOUS: No acute abnormalities.  Review of the MIP images confirms the above findings.  IMPRESSION: Negative for aortic aneurysm or dissection.   Electronically Signed   By: Jorje Guild M.D.   On: 08/28/2013 02:10     PERTINENT LAB RESULTS: CBC:  Recent Labs  08/27/13 2113 08/28/13 0849  WBC 8.2 7.4  HGB 12.3 11.6*  HCT 34.5* 33.0*  PLT 185 185   CMET CMP     Component Value Date/Time   NA 142  08/28/2013 0849   K 3.7 08/28/2013 0849   CL 103 08/28/2013 0849   CO2 27 08/28/2013 0849   GLUCOSE 80 08/28/2013 0849   BUN 13 08/28/2013 0849   CREATININE 0.82 08/28/2013 0849   CALCIUM 8.6 08/28/2013 0849   PROT 6.5 08/28/2013 0849   ALBUMIN 3.4* 08/28/2013 0849   AST 29 08/28/2013 0849   ALT 30 08/28/2013 0849   ALKPHOS 105 08/28/2013 0849   BILITOT 0.5 08/28/2013 0849   GFRNONAA 81* 08/28/2013 0849   GFRAA >90 08/28/2013 0849    GFR Estimated Creatinine Clearance: 95.1 ml/min (by C-G formula based on Cr of 0.82). No results found for this basename: LIPASE, AMYLASE,  in the last 72 hours  Recent Labs  08/28/13 0849 08/28/13 1515 08/28/13 2025  TROPONINI <0.30 <0.30 <0.30   No components found with this basename: POCBNP,  No results found for this basename: DDIMER,  in the last 72 hours  Recent Labs  08/28/13 0849  HGBA1C 12.2*    Recent Labs  08/28/13 0849  CHOL 224*  HDL 61  LDLCALC 142*  TRIG 103  CHOLHDL 3.7    Recent Labs  08/28/13 0849  TSH 2.180   No results found for this basename: VITAMINB12,  FOLATE, FERRITIN, TIBC, IRON, RETICCTPCT,  in the last 72 hours Coags: No results found for this basename: PT, INR,  in the last 72 hours Microbiology: Recent Results (from the past 240 hour(s))  URINE CULTURE     Status: None   Collection Time    08/28/13 11:56 AM      Result Value Ref Range Status   Specimen Description URINE, CLEAN CATCH   Final   Special Requests Normal   Final   Culture  Setup Time     Final   Value: 08/28/2013 18:00     Performed at [facility]     Final   Value: 60,000 COLONIES/ML     Performed at [facility]   Culture     Final   Value: Multiple bacterial morphotypes present, none predominant. Suggest appropriate recollection if clinically indicated.     Performed at [facility]   Report Status 08/29/2013 FINAL   Final  MRSA PCR SCREENING     Status: None   Collection Time    08/28/13  1:38 PM      Result Value Ref Range Status   MRSA by PCR NEGATIVE  NEGATIVE Final   Comment:            The GeneXpert MRSA Assay (FDA     approved for NASAL specimens     only), is one component of a     comprehensive MRSA colonization     surveillance program. It is not     intended to diagnose MRSA     infection nor to guide or     monitor treatment for     MRSA infections.     BRIEF HOSPITAL COURSE:  Hypertensive Urgency  -BP controlled, initially plans were to start on IV Nitro drip, but BP quickly improved, will Lisinopril Labetalol changed to cardizem-see below. BP much better at the time at the time of discharge.  Chest Pain  -although has a hx of esophageal spasms-claimed this current pain is "different"-pressure like radiating to the back and to the left arm.CT Angio of the chest-negative for aortic dissection or central Pul Embolism. EKG showed T wave inversion in v4-v6-given risk factors-DM,HTN,Dyslipidemia- consulted cardiology.-underwent Nuclear  stress test which was negative as well.Troponin negative. Seen by  cards on day of discharge, no further work up recommended at this time. Suspect that her chest pain is either esophageal spasms or musculoskeletal etiology. Spoke with Dr Rachael Fee GI MD-who suggested that we continue with transdermal Nitro and add Cardizem. Since we have added Cardizem, will stop Labetalol.  Hx of Esophageal Dysmotility  -see above-regarding chest pain  -c/w Carafate, PPI-if cardiac eval negative-may need GI eval. Continue with Nitro transdermally, have added Cardizem at the suggestion of Dr Deatra Ina. Patient has been asked to make a follow up appointment with Dr Deatra Ina on discharge  DM  -CBG's stable  -resume oral meds on discharge.  TODAY-DAY OF DISCHARGE:  Subjective:   [name] today has no headache,no new weakness tingling or numbness, feels much better wants to go home today.   Objective:   Blood pressure 124/78, pulse 69, temperature 98.2 F (36.8 C), temperature source Oral, resp. rate 16, height 5\' 5"  (1.651 m), weight 102.2 kg (225 lb 5 oz), SpO2 94.00%. No intake or output data in the 24 hours ending 08/30/13 0746 Filed Weights   08/27/13 2119 08/28/13 1315  Weight: 102.059 kg (225 lb) 102.2 kg (225 lb 5 oz)    Exam Awake Alert, Oriented *3, No new F.N deficits, Normal affect [place].AT,PERRAL Supple Neck,No JVD, No cervical lymphadenopathy appriciated.  Symmetrical Chest wall movement, Good air movement bilaterally, CTAB RRR,No Gallops,Rubs or new Murmurs, No Parasternal Heave +ve B.Sounds, Abd Soft, Non tender, No organomegaly appriciated, No rebound -guarding or rigidity. No Cyanosis, Clubbing or edema, No new Rash or bruise  DISCHARGE CONDITION: Stable  DISPOSITION: Home  DISCHARGE INSTRUCTIONS:    Activity:  As tolerated   Diet recommendation: Diabetic Diet Heart Healthy diet      Discharge Instructions   Call MD for:  difficulty breathing, headache or visual disturbances    Complete by:  As directed      Call MD for:   persistant nausea and vomiting    Complete by:  As directed      Call MD for:  severe uncontrolled pain    Complete by:  As directed      Diet - low sodium heart healthy    Complete by:  As directed      Diet Carb Modified    Complete by:  As directed      Increase activity slowly    Complete by:  As directed            Follow-up Information   Follow up with [place], MD. Schedule an appointment as soon as possible for a visit in 1 week.   Specialty:  Family Medicine   Contact information:   [address] [place] [address] [phone]       Follow up with Erskine Emery, MD. Schedule an appointment as soon as possible for a visit in 1 week.   Specialty:  Gastroenterology   Contact information:   [address] [address] [phone]         Total Time spent on discharge equals 45 minutes.  SignedOren Binet 08/30/2013 7:46 AM  **Disclaimer: This note may have been dictated with voice recognition software. Similar sounding words can inadvertently be transcribed and this note may contain transcription errors which may not have been corrected upon publication of note.**

## 2013-08-30 NOTE — Progress Notes (Signed)
Utilization Review Completed.[name] [name] T7/19/2015  

## 2013-08-31 ENCOUNTER — Telehealth: Payer: Self-pay | Admitting: Gastroenterology

## 2013-08-31 NOTE — Telephone Encounter (Signed)
Would try to schedule an office visit with an extender in the next 2 weeks

## 2013-08-31 NOTE — Telephone Encounter (Signed)
Has OV w/Dr Deatra Ina 10-28-13 @ 2:30pm

## 2013-08-31 NOTE — Telephone Encounter (Signed)
Pt states she was recently in the hospital and they could not find a cause for her chest discomfort that was cardiac related. Pt states she was told to call the office for a follow-up visit with GI. Dr.  Deatra Ina do you want the pt scheduled to see a midlevel and how soon does she need to be seen? Please advise.

## 2013-08-31 NOTE — Telephone Encounter (Signed)
Pt scheduled to see Alonza Bogus PA 09/19/13@10 :30am. Pt aware of appt.

## 2013-09-02 NOTE — ED Provider Notes (Signed)
Medical screening examination/treatment/procedure(s) were performed by non-physician practitioner and as supervising physician I was immediately available for consultation/collaboration.   EKG Interpretation   Date/Time:  Friday August 28 2013 10:29:33 EDT Ventricular Rate:  66 PR Interval:  161 QRS Duration: 73 QT Interval:  424 QTC Calculation: 444 R Axis:   51 Text Interpretation:  Sinus rhythm Borderline T abnormalities, lateral  leads No significant change since last tracing Confirmed by YAO  MD, DAVID  ([id number]) on 08/28/2013 10:33:39 AM        Merryl Hacker, MD 09/02/13 [phone]

## 2013-09-07 ENCOUNTER — Other Ambulatory Visit: Payer: Self-pay | Admitting: Gastroenterology

## 2013-09-07 NOTE — Telephone Encounter (Signed)
Ok to refill 

## 2013-09-08 NOTE — Telephone Encounter (Signed)
Yes, ok to refill.  Thank you,  Jess 

## 2013-09-10 ENCOUNTER — Encounter: Payer: Self-pay | Admitting: *Deleted

## 2013-09-16 ENCOUNTER — Encounter: Payer: Self-pay | Admitting: Gastroenterology

## 2013-09-16 ENCOUNTER — Ambulatory Visit (INDEPENDENT_AMBULATORY_CARE_PROVIDER_SITE_OTHER): Payer: 59 | Admitting: Gastroenterology

## 2013-09-16 VITALS — BP 150/90 | HR 72 | Ht 65.0 in | Wt 221.5 lb

## 2013-09-16 DIAGNOSIS — K224 Dyskinesia of esophagus: Secondary | ICD-10-CM

## 2013-09-16 DIAGNOSIS — K219 Gastro-esophageal reflux disease without esophagitis: Secondary | ICD-10-CM

## 2013-09-16 DIAGNOSIS — R079 Chest pain, unspecified: Secondary | ICD-10-CM

## 2013-09-16 MED ORDER — SUCRALFATE 1 GM/10ML PO SUSP: ORAL | Status: DC

## 2013-09-16 NOTE — Progress Notes (Signed)
09/16/2013 KATHLEAN PAONE [id number] [birth year]   History of Present Illness:  [name] [name] is a pleasant 53 year old African-American female known to Dr. Deatra Ina who has history of esophageal dyskinesia, esophageal stricture, chronic hepatitis C, adult onset diabetes mellitus, and hypertension.  She has had difficulty with recurrent dysphagia, odynophagia, episodes of chest pain and choking for the past several years. She has undergone multiple EGDs and had a manometry and pH study done at [facility] in 2013. She has been responsive to esophageal dilations and Botox injections which provide complete relief of her symptoms for generally 6-7 months up to a year. She had EGD done in November of 2014 was felt to have an early stricture which was balloon dilated and she also had Botox injections in 4 quadrants at the LES. She says she had complete relief of her symptoms up until April or May then she underwent another EGD by Dr. Deatra Ina on 5/14 at which time he dilated her LES and injected Botox due to esophageal spasm at that area.  After that procedure she did not have any relief of her symptoms, which was unusual compared to her previous good responses.  She was previously taking prevacid 30 mg daily, but was switched to Dexilant 60 mg daily at her visit here in early May prior to the EGD.  The Dexilant was increased to BID for a couple of weeks, but then due to cost her PCP recently put her back on omeprazole 40 mg BID, which she just started one week ago.  She says that her cardioloist mentioned that maybe Nexium would help more.  She is here again today with ongoing complaints of chest pain and esophageal spasm.  Along with the omeprazole 40 mg BID, she is also taking carafate suspension four times per day, bentyl 20 mg twice a day alternating with levsin twice per day.  She is taking diltiazem daily for her BP and heart as well as nitroglycerin prn.  Was recently hospitalized and had extensive cardiac  evaluation.  Had a CT angio abdomen and pelvis with and without contrast on 08/28/13 that was unremarkable.   Current Medications, Allergies, Past Medical History, Past Surgical History, Family History and Social History were reviewed in [employer] record.   Physical Exam: BP 150/90  Pulse 72  Ht 5\' 5"  (1.651 m)  Wt 221 lb 8 oz (100.472 kg)  BMI 36.86 kg/m2 General: Well developed black female in no acute distress Head: Normocephalic and atraumatic Eyes:  Sclerae anicteric, conjunctiva pink  Ears: Normal auditory acuity Lungs: Clear throughout to auscultation Heart: Regular rate and rhythm Abdomen: Soft, non-distended.  Normal bowel sounds.  Non-tender. Musculoskeletal: Symmetrical with no gross deformities  Extremities: No edema  Neurological: Alert oriented x 4, grossly nonfocal Psychological:  Alert and cooperative. Normal mood and affect  Assessment and Recommendations: -GERD/esophageal spasm causing chest pain:  Has been on several medications for her symptoms.  No relief of her symptoms after last EGD with dilation and botox, but will re-attempt since we have almost exhausted medication regimens.  Will schedule EGD with dilation and botox injection with Dr. Deatra Ina.  Continue omeprazole 40 mg BID in the interim along with carafate four times a day (refill sent to pharmacy), levsin and bentyl each twice a day (alternating the two).  She also has diltiazem, which she takes daily for her heart/blood pressure and nitroglycerin prn.  The risks, benefits, and alternatives were discussed with the patient and she consents to proceed.   *  Just of note, patient asked again about the lab that she had performed that showed a problem with her small intestine.  That study was a fractionated ALP with total ALP elevated to 144 with intestinal portion elevated at 19 (upper limits of normal is 18).  As stated above, she's undergone extensvie GI evaluation.  At this point, I do  not think that the elevation has any great significance.

## 2013-09-16 NOTE — Patient Instructions (Signed)

## 2013-09-17 NOTE — Progress Notes (Signed)
Reviewed and agree with management. Robert D. Kaplan, M.D., FACG  

## 2013-10-21 NOTE — Anesthesia Preprocedure Evaluation (Addendum)
Anesthesia Evaluation  Patient identified by MRN, date of birth, ID band Patient awake    Reviewed: Allergy & Precautions, H&P , NPO status , Patient's Chart, lab work & pertinent test results  History of Anesthesia Complications Negative for: history of anesthetic complications  Airway Mallampati: II TM Distance: >3 FB Neck ROM: Full    Dental no notable dental hx. (+) Dental Advisory Given, Teeth Intact   Pulmonary pneumonia -, resolved,  breath sounds clear to auscultation  Pulmonary exam normal       Cardiovascular hypertension, Pt. on home beta blockers Rhythm:Regular Rate:Normal  TTE 7/15: EF 65-70 w/ no valvular abnormalities   Neuro/Psych  Headaches, PSYCHIATRIC DISORDERS Anxiety Depression  Neuromuscular disease    GI/Hepatic PUD, GERD-  Medicated and Controlled,(+) Hepatitis -, C  Endo/Other  diabetes, Poorly Controlled, Type 2, Oral Hypoglycemic Agents  Renal/GU negative Renal ROS  negative genitourinary   Musculoskeletal  (+) Arthritis -, Osteoarthritis,    Abdominal (+)  Abdomen: soft.    Peds negative pediatric ROS (+)  Hematology negative hematology ROS (+)   Anesthesia Other Findings   Reproductive/Obstetrics negative OB ROS                        Anesthesia Physical Anesthesia Plan  ASA: III  Anesthesia Plan: MAC   Post-op Pain Management:    Induction: Intravenous  Airway Management Planned: Nasal Cannula  Additional Equipment:   Intra-op Plan:   Post-operative Plan:   Informed Consent: I have reviewed the patients History and Physical, chart, labs and discussed the procedure including the risks, benefits and alternatives for the proposed anesthesia with the patient or authorized representative who has indicated his/her understanding and acceptance.   Dental advisory given  Plan Discussed with: CRNA  Anesthesia Plan Comments:         Anesthesia Quick  Evaluation

## 2013-10-22 ENCOUNTER — Encounter (HOSPITAL_COMMUNITY): Payer: Self-pay | Admitting: Emergency Medicine

## 2013-10-22 ENCOUNTER — Emergency Department (HOSPITAL_COMMUNITY)
Admission: EM | Admit: 2013-10-22 | Discharge: 2013-10-22 | Disposition: A | Discharge status: Home / Self Care | Payer: 59 | Attending: Emergency Medicine | Admitting: Emergency Medicine

## 2013-10-22 ENCOUNTER — Ambulatory Visit (HOSPITAL_COMMUNITY): Admission: RE | Admit: 2013-10-22 | Payer: 59 | Source: Ambulatory Visit | Admitting: Gastroenterology

## 2013-10-22 ENCOUNTER — Other Ambulatory Visit: Payer: Self-pay

## 2013-10-22 DIAGNOSIS — Z8619 Personal history of other infectious and parasitic diseases: Secondary | ICD-10-CM | POA: Diagnosis not present

## 2013-10-22 DIAGNOSIS — K219 Gastro-esophageal reflux disease without esophagitis: Secondary | ICD-10-CM | POA: Insufficient documentation

## 2013-10-22 DIAGNOSIS — I10 Essential (primary) hypertension: Secondary | ICD-10-CM | POA: Insufficient documentation

## 2013-10-22 DIAGNOSIS — F411 Generalized anxiety disorder: Secondary | ICD-10-CM | POA: Insufficient documentation

## 2013-10-22 DIAGNOSIS — F3289 Other specified depressive episodes: Secondary | ICD-10-CM | POA: Insufficient documentation

## 2013-10-22 DIAGNOSIS — G8929 Other chronic pain: Secondary | ICD-10-CM | POA: Insufficient documentation

## 2013-10-22 DIAGNOSIS — R5381 Other malaise: Secondary | ICD-10-CM | POA: Insufficient documentation

## 2013-10-22 DIAGNOSIS — E1165 Type 2 diabetes mellitus with hyperglycemia: Secondary | ICD-10-CM

## 2013-10-22 DIAGNOSIS — F329 Major depressive disorder, single episode, unspecified: Secondary | ICD-10-CM | POA: Insufficient documentation

## 2013-10-22 DIAGNOSIS — Z79899 Other long term (current) drug therapy: Secondary | ICD-10-CM | POA: Diagnosis not present

## 2013-10-22 DIAGNOSIS — Z8701 Personal history of pneumonia (recurrent): Secondary | ICD-10-CM | POA: Diagnosis not present

## 2013-10-22 DIAGNOSIS — M129 Arthropathy, unspecified: Secondary | ICD-10-CM | POA: Insufficient documentation

## 2013-10-22 DIAGNOSIS — R5383 Other fatigue: Secondary | ICD-10-CM | POA: Diagnosis present

## 2013-10-22 DIAGNOSIS — Z87440 Personal history of urinary (tract) infections: Secondary | ICD-10-CM | POA: Diagnosis not present

## 2013-10-22 DIAGNOSIS — E860 Dehydration: Secondary | ICD-10-CM | POA: Insufficient documentation

## 2013-10-22 DIAGNOSIS — K222 Esophageal obstruction: Secondary | ICD-10-CM

## 2013-10-22 DIAGNOSIS — E119 Type 2 diabetes mellitus without complications: Secondary | ICD-10-CM | POA: Insufficient documentation

## 2013-10-22 DIAGNOSIS — R531 Weakness: Secondary | ICD-10-CM

## 2013-10-22 LAB — COMPREHENSIVE METABOLIC PANEL
ALK PHOS: 161 U/L — AB (ref 39–117)
ALT: 27 U/L (ref 0–35)
AST: 22 U/L (ref 0–37)
Albumin: 3.8 g/dL (ref 3.5–5.2)
Anion gap: 16 — ABNORMAL HIGH (ref 5–15)
BUN: 33 mg/dL — ABNORMAL HIGH (ref 6–23)
CO2: 23 meq/L (ref 19–32)
Calcium: 9.8 mg/dL (ref 8.4–10.5)
Chloride: 93 mEq/L — ABNORMAL LOW (ref 96–112)
Creatinine, Ser: 1 mg/dL (ref 0.50–1.10)
GFR calc non Af Amer: 64 mL/min — ABNORMAL LOW (ref >90)
GFR, EST AFRICAN AMERICAN: 74 mL/min — AB (ref >90)
Glucose, Bld: 401 mg/dL — ABNORMAL HIGH (ref 70–99)
POTASSIUM: 4.4 meq/L (ref 3.7–5.3)
SODIUM: 132 meq/L — AB (ref 137–147)
TOTAL PROTEIN: 7.9 g/dL (ref 6.0–8.3)
Total Bilirubin: 0.5 mg/dL (ref 0.3–1.2)

## 2013-10-22 LAB — URINE MICROSCOPIC-ADD ON

## 2013-10-22 LAB — CBC WITH DIFFERENTIAL/PLATELET
Basophils Absolute: 0 10*3/uL (ref 0.0–0.1)
Basophils Relative: 0 % (ref 0–1)
EOS ABS: 0.2 10*3/uL (ref 0.0–0.7)
Eosinophils Relative: 1 % (ref 0–5)
HCT: 39.7 % (ref 36.0–46.0)
Hemoglobin: 14.4 g/dL (ref 12.0–15.0)
LYMPHS ABS: 3.6 10*3/uL (ref 0.7–4.0)
Lymphocytes Relative: 32 % (ref 12–46)
MCH: 27.9 pg (ref 26.0–34.0)
MCHC: 36.3 g/dL — ABNORMAL HIGH (ref 30.0–36.0)
MCV: 76.9 fL — AB (ref 78.0–100.0)
MONOS PCT: 6 % (ref 3–12)
Monocytes Absolute: 0.6 10*3/uL (ref 0.1–1.0)
Neutro Abs: 6.8 10*3/uL (ref 1.7–7.7)
Neutrophils Relative %: 61 % (ref 43–77)
PLATELETS: 221 10*3/uL (ref 150–400)
RBC: 5.16 MIL/uL — AB (ref 3.87–5.11)
RDW: 13.1 % (ref 11.5–15.5)
WBC: 11.2 10*3/uL — ABNORMAL HIGH (ref 4.0–10.5)

## 2013-10-22 LAB — LIPASE, BLOOD: Lipase: 92 U/L — ABNORMAL HIGH (ref 11–59)

## 2013-10-22 LAB — URINALYSIS, ROUTINE W REFLEX MICROSCOPIC
Bilirubin Urine: NEGATIVE
Glucose, UA: 250 mg/dL — AB
Hgb urine dipstick: NEGATIVE
Ketones, ur: NEGATIVE mg/dL
NITRITE: NEGATIVE
Protein, ur: 30 mg/dL — AB
SPECIFIC GRAVITY, URINE: 1.02 (ref 1.005–1.030)
Urobilinogen, UA: 0.2 mg/dL (ref 0.0–1.0)
pH: 5 (ref 5.0–8.0)

## 2013-10-22 LAB — I-STAT TROPONIN, ED: TROPONIN I, POC: 0 ng/mL (ref 0.00–0.08)

## 2013-10-22 LAB — CBG MONITORING, ED: GLUCOSE-CAPILLARY: 459 mg/dL — AB (ref 70–99)

## 2013-10-22 LAB — PROTIME-INR
INR: 0.99 (ref 0.00–1.49)
Prothrombin Time: 13.1 seconds (ref 11.6–15.2)

## 2013-10-22 LAB — APTT: aPTT: 25 seconds (ref 24–37)

## 2013-10-22 SURGERY — ESOPHAGOGASTRODUODENOSCOPY (EGD) WITH PROPOFOL
Anesthesia: Monitor Anesthesia Care

## 2013-10-22 MED ORDER — SODIUM CHLORIDE 0.9 % IV SOLN
1000.0000 mL | Freq: Once | INTRAVENOUS | Status: AC
  Administered 2013-10-22: 1000 mL via INTRAVENOUS

## 2013-10-22 MED ORDER — INSULIN ASPART 100 UNIT/ML ~~LOC~~ SOLN
5.0000 [IU] | Freq: Once | SUBCUTANEOUS | Status: AC
  Administered 2013-10-22: 5 [IU] via SUBCUTANEOUS
  Filled 2013-10-22: qty 1

## 2013-10-22 MED ORDER — SODIUM CHLORIDE 0.9 % IV SOLN
INTRAVENOUS | Status: DC
  Administered 2013-10-22: 16:00:00 via INTRAVENOUS

## 2013-10-22 NOTE — ED Notes (Addendum)
Pt called out for RN c/o IV site burning and irritation. RN flushed IV without difficulty, no evidence of infiltration or phlebitis. No obvious skin changes. RN offered to start new IV for comfort purposes, pt refused.

## 2013-10-22 NOTE — ED Notes (Signed)
Pt c/o generalized weakness, dizziness and lightheadedness x 2 weeks. Pt sts the weakness started in her legs and has progressed. Pt sts she can walk but feels unsteady on her feet. Pt A&Ox4. Pt sts she hasn't eaten or drank anything since last night.

## 2013-10-22 NOTE — ED Notes (Signed)
Pt was scheduled for endoscopy today but got canceled. Pt now wants to be evaluated for lightheadedness and fatigue.

## 2013-10-22 NOTE — ED Provider Notes (Signed)
CSN: [id number]     Arrival date & time 10/22/13  1220 History   First MD Initiated Contact with Patient 10/22/13 1505     Chief Complaint  Patient presents with  . Fatigue     (Consider location/radiation/quality/duration/timing/severity/associated sxs/prior Treatment) HPI The patient presents with a 2 week history of fatigue and weakness. It is generalized in nature. The patient denies any actual associated pain but does report at night she seems to get a lot of leg cramping in her calves. She reports his exertion sometimes she does feel so fatigue she has to sit down and doesn't feel like she can walk across her house. There were no syncopal episodes. No vomiting no diarrhea. The patient does have extensive GI history however and was in today for upper endoscopy which ultimately was canceled. Nothing seems to improve this. It is worsened by exertion. Patient denies any bloody or dark looking stool. She reports she is eating regular meals.  Past Medical History  Diagnosis Date  . GERD (gastroesophageal reflux disease)   . Hypertension   . Ulcer     hx gastric ulcers  . Esophageal spasm   . Hepatitis C   . High cholesterol   . Pneumonia     "several times"  . Type II diabetes mellitus   . Migraine     "monthly" (08/28/2013)  . Arthritis     "hips" (08/28/2013)  . Chronic lower back pain   . Anxiety   . Depression   . Frequent UTI    Past Surgical History  Procedure Laterality Date  . Cholecystectomy    . Abdominal hysterectomy    . Appendectomy    . Heel spur excision Right   . Tonsillectomy    . Esophagogastroduodenoscopy N/A 12/29/2012    Procedure: ESOPHAGOGASTRODUODENOSCOPY (EGD);  Surgeon: Inda Castle, MD;  Location: [facility];  Service: Endoscopy;  Laterality: N/A;  . Botox injection  12/29/2012    Procedure: BOTOX INJECTION;  Surgeon: Inda Castle, MD;  Location: [facility];  Service: Endoscopy;;  . Balloon dilation N/A 12/29/2012    Procedure: Larrie Kass  DILATION;  Surgeon: Inda Castle, MD;  Location: [facility];  Service: Endoscopy;  Laterality: N/A;  . Esophagogastroduodenoscopy N/A 06/25/2013    Procedure: ESOPHAGOGASTRODUODENOSCOPY (EGD);  Surgeon: Inda Castle, MD;  Location: [facility];  Service: Endoscopy;  Laterality: N/A;  . Balloon dilation N/A 06/25/2013    Procedure: BALLOON DILATION;  Surgeon: Inda Castle, MD;  Location: [facility];  Service: Endoscopy;  Laterality: N/A;  . Botox injection N/A 06/25/2013    Procedure: BOTOX INJECTION;  Surgeon: Inda Castle, MD;  Location: [facility];  Service: Endoscopy;  Laterality: N/A;  . Carpal tunnel release Right   . Tubal ligation    . Foot fracture surgery Right   . Cardiac catheterization  X 2    /notes 08/28/2013  . Colonoscopy  04/10/2012    Normal    Family History  Problem Relation Age of Onset  . Colon cancer Neg Hx   . Rectal cancer Neg Hx   . Stomach cancer Neg Hx   . Throat cancer Sister 66    died at age 74  . Alzheimer's disease Father   . Hypertension Mother    History  Substance Use Topics  . Smoking status: Never Smoker   . Smokeless tobacco: Never Used  . Alcohol Use: No   OB History   Grav Para Term Preterm Abortions TAB SAB Ect Mult  Living                 Review of Systems  10 Systems reviewed and are negative for acute change except as noted in the HPI.   Allergies  Codeine and Dilaudid  Home Medications   Prior to Admission medications   Medication Sig Start Date End Date Taking? Authorizing Provider  acetaminophen (TYLENOL) 500 MG tablet Take 1,000 mg by mouth 2 (two) times daily.   Yes Historical Provider, MD  dicyclomine (BENTYL) 20 MG tablet Take 20 mg by mouth every 6 (six) hours.   Yes Historical Provider, MD  glimepiride (AMARYL) 4 MG tablet Take 4 mg by mouth 2 (two) times daily.   Yes Historical Provider, MD  hyoscyamine (LEVSIN SL) 0.125 MG SL tablet Place 0.125 mg under the tongue every 4 (four) hours as needed  for cramping.    Yes Historical Provider, MD  labetalol (NORMODYNE) 200 MG tablet Take 300 mg by mouth 2 (two) times daily.   Yes Historical Provider, MD  lisinopril (PRINIVIL,ZESTRIL) 20 MG tablet Take 20 mg by mouth 2 (two) times daily.   Yes Historical Provider, MD  nitroGLYCERIN (NITRODUR - DOSED IN MG/24 HR) 0.2 mg/hr patch Place 0.2 mg onto the skin daily.   Yes Historical Provider, MD  nitroGLYCERIN (NITROSTAT) 0.4 MG SL tablet Place 0.4 mg under the tongue every 5 (five) minutes as needed for chest pain.   Yes Historical Provider, MD  omeprazole (PRILOSEC) 40 MG capsule Take 40 mg by mouth 2 (two) times daily.   Yes Historical Provider, MD  sertraline (ZOLOFT) 25 MG tablet Take 25 mg by mouth daily.   Yes Historical Provider, MD   BP 119/77  Pulse 81  Temp(Src) 98.2 F (36.8 C) (Oral)  Resp 18  SpO2 98% Physical Exam  Constitutional: She is oriented to person, place, and time. She appears well-developed and well-nourished.  HENT:  Head: Normocephalic and atraumatic.  Eyes: EOM are normal. Pupils are equal, round, and reactive to light.  Neck: Neck supple.  Cardiovascular: Normal rate, regular rhythm, normal heart sounds and intact distal pulses.   Pulmonary/Chest: Effort normal and breath sounds normal.  Abdominal: Soft. Bowel sounds are normal. She exhibits no distension. There is no tenderness.  Musculoskeletal: Normal range of motion. She exhibits no edema.  Neurological: She is alert and oriented to person, place, and time. She has normal strength. Coordination normal. GCS eye subscore is 4. GCS verbal subscore is 5. GCS motor subscore is 6.  Skin: Skin is warm, dry and intact.  Psychiatric: She has a normal mood and affect.    ED Course  Procedures (including critical care time) Labs Review Labs Reviewed  COMPREHENSIVE METABOLIC PANEL - Abnormal; Notable for the following:    Sodium 132 (*)    Chloride 93 (*)    Glucose, Bld 401 (*)    BUN 33 (*)    Alkaline  Phosphatase 161 (*)    GFR calc non Af Amer 64 (*)    GFR calc Af Amer 74 (*)    Anion gap 16 (*)    All other components within normal limits  LIPASE, BLOOD - Abnormal; Notable for the following:    Lipase 92 (*)    All other components within normal limits  CBC WITH DIFFERENTIAL - Abnormal; Notable for the following:    WBC 11.2 (*)    RBC 5.16 (*)    MCV 76.9 (*)    MCHC 36.3 (*)  All other components within normal limits  CBG MONITORING, ED - Abnormal; Notable for the following:    Glucose-Capillary 459 (*)    All other components within normal limits  APTT  PROTIME-INR  URINALYSIS, ROUTINE W REFLEX MICROSCOPIC  I-STAT TROPOININ, ED    Imaging Review No results found.   EKG Interpretation None      MDM   Final diagnoses:  Dehydration, mild  Weak  Type 2 diabetes mellitus with hyperglycemia   The patient scratch rather prolonged fatigue and weakness. However at this point time due to her prep for endoscopy, she appears to have some dehydration. BUN is elevated, creatinine is near baseline. There is no anemia. Patient is hyperglycemic but not acidotic. This will be treated in the emergency department and patient was counseled to follow up with her family physician for further monitoring of blood chemistries. She'll be advised to return to her normal diabetic diet and oral intake. She will need follow up with her family physician within the next one to 2 days to determine if labs are normalizing with return to normal by mouth intake and diabetes management.    Charlesetta Shanks, MD 10/22/13 [phone]

## 2013-10-22 NOTE — ED Notes (Signed)
Pt still unable to void only 25cc of fluid given IV at this time

## 2013-10-22 NOTE — ED Notes (Signed)
Pt made aware of need for urine specimen pt states she can not void at this time

## 2013-10-22 NOTE — ED Notes (Signed)
Waiting on urine to come back before sending pt. Home.

## 2013-10-22 NOTE — Discharge Instructions (Signed)
Hyperglycemia °Hyperglycemia occurs when the glucose (sugar) in your blood is too high. Hyperglycemia can happen for many reasons, but it most often happens to people who do not know they have diabetes or are not managing their diabetes properly.  °CAUSES  °Whether you have diabetes or not, there are other causes of hyperglycemia. Hyperglycemia can occur when you have diabetes, but it can also occur in other situations that you might not be as aware of, such as: °Diabetes °· If you have diabetes and are having problems controlling your blood glucose, hyperglycemia could occur because of some of the following reasons: °¨ Not following your meal plan. °¨ Not taking your diabetes medications or not taking it properly. °¨ Exercising less or doing less activity than you normally do. °¨ Being sick. °Pre-diabetes °· This cannot be ignored. Before people develop Type 2 diabetes, they almost always have "pre-diabetes." This is when your blood glucose levels are higher than normal, but not yet high enough to be diagnosed as diabetes. Research has shown that some long-term damage to the body, especially the heart and circulatory system, may already be occurring during pre-diabetes. If you take action to manage your blood glucose when you have pre-diabetes, you may delay or prevent Type 2 diabetes from developing. °Stress °· If you have diabetes, you may be "diet" controlled or on oral medications or insulin to control your diabetes. However, you may find that your blood glucose is higher than usual in the hospital whether you have diabetes or not. This is often referred to as "stress hyperglycemia." Stress can elevate your blood glucose. This happens because of hormones put out by the body during times of stress. If stress has been the cause of your high blood glucose, it can be followed regularly by your caregiver. That way he/she can make sure your hyperglycemia does not continue to get worse or progress to  diabetes. °Steroids °· Steroids are medications that act on the infection fighting system (immune system) to block inflammation or infection. One side effect can be a rise in blood glucose. Most people can produce enough extra insulin to allow for this rise, but for those who cannot, steroids make blood glucose levels go even higher. It is not unusual for steroid treatments to "uncover" diabetes that is developing. It is not always possible to determine if the hyperglycemia will go away after the steroids are stopped. A special blood test called an A1c is sometimes done to determine if your blood glucose was elevated before the steroids were started. °SYMPTOMS °· Thirsty. °· Frequent urination. °· Dry mouth. °· Blurred vision. °· Tired or fatigue. °· Weakness. °· Sleepy. °· Tingling in feet or leg. °DIAGNOSIS  °Diagnosis is made by monitoring blood glucose in one or all of the following ways: °· A1c test. This is a chemical found in your blood. °· Fingerstick blood glucose monitoring. °· Laboratory results. °TREATMENT  °First, knowing the cause of the hyperglycemia is important before the hyperglycemia can be treated. Treatment may include, but is not be limited to: °· Education. °· Change or adjustment in medications. °· Change or adjustment in meal plan. °· Treatment for an illness, infection, etc. °· More frequent blood glucose monitoring. °· Change in exercise plan. °· Decreasing or stopping steroids. °· Lifestyle changes. °HOME CARE INSTRUCTIONS  °· Test your blood glucose as directed. °· Exercise regularly. Your caregiver will give you instructions about exercise. Pre-diabetes or diabetes which comes on with stress is helped by exercising. °· Eat wholesome,   balanced meals. Eat often and at regular, fixed times. Your caregiver or nutritionist will give you a meal plan to guide your sugar intake.  Being at an ideal weight is important. If needed, losing as little as 10 to 15 pounds may help improve blood  glucose levels. SEEK MEDICAL CARE IF:   You have questions about medicine, activity, or diet.  You continue to have symptoms (problems such as increased thirst, urination, or weight gain). SEEK IMMEDIATE MEDICAL CARE IF:   You are vomiting or have diarrhea.  Your breath smells fruity.  You are breathing faster or slower.  You are very sleepy or incoherent.  You have numbness, tingling, or pain in your feet or hands.  You have chest pain.  Your symptoms get worse even though you have been following your caregiver's orders.  If you have any other questions or concerns. Document Released: 07/25/2000 Document Revised: 04/23/2011 Document Reviewed: 05/28/2011 [facility] Patient Information 2015 [place], [place]. This information is not intended to replace advice given to you by your health care provider. Make sure you discuss any questions you have with your health care provider. Dehydration, Adult Dehydration is when you lose more fluids from the body than you take in. Vital organs like the kidneys, brain, and heart cannot function without a proper amount of fluids and salt. Any loss of fluids from the body can cause dehydration.  CAUSES   Vomiting.  Diarrhea.  Excessive sweating.  Excessive urine output.  Fever. SYMPTOMS  Mild dehydration  Thirst.  Dry lips.  Slightly dry mouth. Moderate dehydration  Very dry mouth.  Sunken eyes.  Skin does not bounce back quickly when lightly pinched and released.  Dark urine and decreased urine production.  Decreased tear production.  Headache. Severe dehydration  Very dry mouth.  Extreme thirst.  Rapid, weak pulse (more than 100 beats per minute at rest).  Cold hands and feet.  Not able to sweat in spite of heat and temperature.  Rapid breathing.  Blue lips.  Confusion and lethargy.  Difficulty being awakened.  Minimal urine production.  No tears. DIAGNOSIS  Your caregiver will diagnose dehydration based  on your symptoms and your exam. Blood and urine tests will help confirm the diagnosis. The diagnostic evaluation should also identify the cause of dehydration. TREATMENT  Treatment of mild or moderate dehydration can often be done at home by increasing the amount of fluids that you drink. It is best to drink small amounts of fluid more often. Drinking too much at one time can make vomiting worse. Refer to the home care instructions below. Severe dehydration needs to be treated at the hospital where you will probably be given intravenous (IV) fluids that contain water and electrolytes. HOME CARE INSTRUCTIONS   Ask your caregiver about specific rehydration instructions.  Drink enough fluids to keep your urine clear or pale yellow.  Drink small amounts frequently if you have nausea and vomiting.  Eat as you normally do.  Avoid:  Foods or drinks high in sugar.  Carbonated drinks.  Juice.  Extremely hot or cold fluids.  Drinks with caffeine.  Fatty, greasy foods.  Alcohol.  Tobacco.  Overeating.  Gelatin desserts.  Wash your hands well to avoid spreading bacteria and viruses.  Only take over-the-counter or prescription medicines for pain, discomfort, or fever as directed by your caregiver.  Ask your caregiver if you should continue all prescribed and over-the-counter medicines.  Keep all follow-up appointments with your caregiver. SEEK MEDICAL CARE IF:  You  have abdominal pain and it increases or stays in one area (localizes).  You have a rash, stiff neck, or severe headache.  You are irritable, sleepy, or difficult to awaken.  You are weak, dizzy, or extremely thirsty. SEEK IMMEDIATE MEDICAL CARE IF:   You are unable to keep fluids down or you get worse despite treatment.  You have frequent episodes of vomiting or diarrhea.  You have blood or green matter (bile) in your vomit.  You have blood in your stool or your stool looks black and tarry.  You have not  urinated in 6 to 8 hours, or you have only urinated a small amount of very dark urine.  You have a fever.  You faint. MAKE SURE YOU:   Understand these instructions.  Will watch your condition.  Will get help right away if you are not doing well or get worse. Document Released: 01/29/2005 Document Revised: 04/23/2011 Document Reviewed: 09/18/2010 [facility] Patient Information 2015 [place], [place]. This information is not intended to replace advice given to you by your health care provider. Make sure you discuss any questions you have with your health care provider.

## 2013-10-26 ENCOUNTER — Encounter (HOSPITAL_COMMUNITY): Payer: Self-pay | Admitting: Pharmacy Technician

## 2013-10-27 ENCOUNTER — Encounter (HOSPITAL_COMMUNITY): Payer: Self-pay | Admitting: *Deleted

## 2013-10-28 ENCOUNTER — Encounter: Payer: 59 | Admitting: Gastroenterology

## 2013-10-28 ENCOUNTER — Ambulatory Visit (INDEPENDENT_AMBULATORY_CARE_PROVIDER_SITE_OTHER): Payer: 59 | Admitting: Gastroenterology

## 2013-11-03 ENCOUNTER — Encounter (HOSPITAL_COMMUNITY)
Admission: RE | Disposition: A | Discharge status: Home / Self Care | Payer: Self-pay | Source: Ambulatory Visit | Attending: Gastroenterology

## 2013-11-03 ENCOUNTER — Encounter (HOSPITAL_COMMUNITY): Payer: Self-pay | Admitting: *Deleted

## 2013-11-03 ENCOUNTER — Ambulatory Visit (HOSPITAL_COMMUNITY)
Admission: RE | Admit: 2013-11-03 | Discharge: 2013-11-03 | Disposition: A | Discharge status: Home / Self Care | Payer: 59 | Source: Ambulatory Visit | Attending: Gastroenterology | Admitting: Gastroenterology

## 2013-11-03 ENCOUNTER — Encounter (HOSPITAL_COMMUNITY): Payer: 59 | Admitting: Anesthesiology

## 2013-11-03 ENCOUNTER — Ambulatory Visit (HOSPITAL_COMMUNITY): Payer: 59 | Admitting: Anesthesiology

## 2013-11-03 DIAGNOSIS — K219 Gastro-esophageal reflux disease without esophagitis: Secondary | ICD-10-CM | POA: Insufficient documentation

## 2013-11-03 DIAGNOSIS — K222 Esophageal obstruction: Secondary | ICD-10-CM | POA: Insufficient documentation

## 2013-11-03 DIAGNOSIS — I10 Essential (primary) hypertension: Secondary | ICD-10-CM | POA: Insufficient documentation

## 2013-11-03 DIAGNOSIS — E119 Type 2 diabetes mellitus without complications: Secondary | ICD-10-CM | POA: Diagnosis not present

## 2013-11-03 DIAGNOSIS — B1920 Unspecified viral hepatitis C without hepatic coma: Secondary | ICD-10-CM | POA: Diagnosis not present

## 2013-11-03 DIAGNOSIS — R079 Chest pain, unspecified: Secondary | ICD-10-CM | POA: Diagnosis present

## 2013-11-03 DIAGNOSIS — K224 Dyskinesia of esophagus: Secondary | ICD-10-CM

## 2013-11-03 HISTORY — PX: ESOPHAGOGASTRODUODENOSCOPY: SHX5428

## 2013-11-03 LAB — GLUCOSE, CAPILLARY: GLUCOSE-CAPILLARY: 235 mg/dL — AB (ref 70–99)

## 2013-11-03 SURGERY — EGD (ESOPHAGOGASTRODUODENOSCOPY)
Anesthesia: Monitor Anesthesia Care

## 2013-11-03 MED ORDER — LACTATED RINGERS IV SOLN
INTRAVENOUS | Status: DC
  Administered 2013-11-03: 1000 mL via INTRAVENOUS

## 2013-11-03 MED ORDER — PROPOFOL 10 MG/ML IV BOLUS
INTRAVENOUS | Status: AC
  Filled 2013-11-03: qty 20

## 2013-11-03 MED ORDER — FENTANYL CITRATE 0.05 MG/ML IJ SOLN
INTRAMUSCULAR | Status: DC | PRN
  Administered 2013-11-03 (×2): 50 ug via INTRAVENOUS

## 2013-11-03 MED ORDER — PROPOFOL INFUSION 10 MG/ML OPTIME
INTRAVENOUS | Status: DC | PRN
  Administered 2013-11-03: 75 ug/kg/min via INTRAVENOUS

## 2013-11-03 MED ORDER — SODIUM CHLORIDE 0.9 % IV SOLN: INTRAVENOUS | Status: DC

## 2013-11-03 MED ORDER — LIDOCAINE HCL (CARDIAC) 20 MG/ML IV SOLN
INTRAVENOUS | Status: AC
  Filled 2013-11-03: qty 5

## 2013-11-03 MED ORDER — FENTANYL CITRATE 0.05 MG/ML IJ SOLN
INTRAMUSCULAR | Status: AC
  Filled 2013-11-03: qty 2

## 2013-11-03 MED ORDER — FENTANYL CITRATE 0.05 MG/ML IJ SOLN: 25.0000 ug | INTRAMUSCULAR | Status: DC | PRN

## 2013-11-03 MED ORDER — MEPERIDINE HCL 100 MG/ML IJ SOLN: 6.2500 mg | INTRAMUSCULAR | Status: DC | PRN

## 2013-11-03 MED ORDER — GLYCOPYRROLATE 0.2 MG/ML IJ SOLN
INTRAMUSCULAR | Status: DC | PRN
  Administered 2013-11-03 (×2): 0.1 mg via INTRAVENOUS

## 2013-11-03 MED ORDER — PROPOFOL 10 MG/ML IV BOLUS
INTRAVENOUS | Status: DC | PRN
  Administered 2013-11-03: 20 mg via INTRAVENOUS
  Administered 2013-11-03: 10 mg via INTRAVENOUS

## 2013-11-03 MED ORDER — ONABOTULINUMTOXINA 100 UNITS IJ SOLR
100.0000 [IU] | Freq: Once | INTRAMUSCULAR | Status: AC
  Administered 2013-11-03: 100 [IU] via INTRAMUSCULAR
  Filled 2013-11-03: qty 100

## 2013-11-03 NOTE — Discharge Instructions (Signed)
Gastrointestinal Endoscopy, Care After °Refer to this sheet in the next few weeks. These instructions provide you with information on caring for yourself after your procedure. Your caregiver may also give you more specific instructions. Your treatment has been planned according to current medical practices, but problems sometimes occur. Call your caregiver if you have any problems or questions after your procedure. °HOME CARE INSTRUCTIONS °· If you were given medicine to help you relax (sedative), do not drive, operate machinery, or sign important documents for 24 hours. °· Avoid alcohol and hot or warm beverages for the first 24 hours after the procedure. °· Only take over-the-counter or prescription medicines for pain, discomfort, or fever as directed by your caregiver. You may resume taking your normal medicines unless your caregiver tells you otherwise. Ask your caregiver when you may resume taking medicines that may cause bleeding, such as aspirin, clopidogrel, or warfarin. °· You may return to your normal diet and activities on the day after your procedure, or as directed by your caregiver. Walking may help to reduce any bloated feeling in your abdomen. °· Drink enough fluids to keep your urine clear or pale yellow. °· You may gargle with salt water if you have a sore throat. °SEEK IMMEDIATE MEDICAL CARE IF: °· You have severe nausea or vomiting. °· You have severe abdominal pain, abdominal cramps that last longer than 6 hours, or abdominal swelling (distention). °· You have severe shoulder or back pain. °· You have trouble swallowing. °· You have shortness of breath, your breathing is shallow, or you are breathing faster than normal. °· You have a fever or a rapid heartbeat. °· You vomit blood or material that looks like coffee grounds. °· You have bloody, black, or tarry stools. °MAKE SURE YOU: °· Understand these instructions. °· Will watch your condition. °· Will get help right away if you are not doing  well or get worse. °Document Released: 09/13/2003 Document Revised: 06/15/2013 Document Reviewed: 05/01/2011 °[employer]® Patient Information ©2015 [employer]. This information is not intended to replace advice given to you by your health care provider. Make sure you discuss any questions you have with your health care provider. ° °

## 2013-11-03 NOTE — Addendum Note (Signed)
Addendum created 11/03/13 0841 by Alexis Frock, MD   Modules edited: Anesthesia Attestations

## 2013-11-03 NOTE — Op Note (Signed)
[facility] [place], [id number]   EGD PROCEDURE REPORT     EXAM DATE: 11/28/2013  PATIENT NAME:          [name] [name], [name] [name]          MR#:        [id number]  BIRTHDATE:       01/15/1961     VISIT #:     [phone] ATTENDING:     Inda Castle, MD     STATUS:     outpatient ASSISTANT:      Luanne Bras and [facility], [place]  INDICATIONS:  The patient is a 53 yr old female here for an EGD due to chest pain. PROCEDURE PERFORMED:     EGD w/ directed submucosal injection(s), any substance  , EGD with balloon dilation MEDICATIONS:     Monitored anesthesia care TOPICAL ANESTHETIC:  CONSENT: The patient understands the risks and benefits of the procedure and understands that these risks include, but are not limited to: sedation, allergic reaction, infection, perforation and/or bleeding. Alternative means of evaluation and treatment include, among others: physical exam, x-rays, and/or surgical intervention. The patient elects to proceed with this endoscopic procedure.  DESCRIPTION OF PROCEDURE: During intra-op preparation period all mechanical & medical equipment was checked for proper function. Hand hygiene and appropriate measures for infection prevention was taken. After the risks, benefits and alternatives of the procedure were thoroughly explained, Informed consent was verified, confirmed and timeout was successfully executed by the treatment team. The patient was anesthetized with topical anesthesia and the    endoscope was introduced through the mouth and advanced to the third portion of the duodenum.  The instrument was slowly withdrawn as the mucosa was fully examined.   ESOPHAGUS: The esophagus and gastroesophageal junction were completely normal in appearance.  STOMACH: The stomach was entered and closely examined.  the antrum, angularis, and lesser curvature were well visualized, including a retroflexed view of the cardia and fundus.   The stomach wall was normally distensable.  The scope passed easily through the pylorus into the duodenum.  DUODENUM: The duodenal bulb was normal in appearance, as was the postbulbar duodenum. Retroflexed views revealed no abnormalities. A number 31mm balloon dilator was inflated for 30 seconds.  There was minimal resistance and no heme.  Botox 25 units (1cc) was injected submucosally into each quadrant at the GE junction    ADVERSE EVENTS:     There were no complications. IMPRESSIONS:     Normal EGD - s/p balloon dilation and botox injection  RECOMMENDATIONS:     OV 6 weeks REPEAT EXAM:   ___________________________________ Inda Castle, MD eSigned:  Inda Castle, MD 11-28-13 8:14 AM   cc:   CPT CODES: ICD9 CODES:  The ICD and CPT codes recommended by this software are interpretations from the data that the clinical staff has captured with the software.  The verification of the translation of this report to the ICD and CPT codes and modifiers is the sole responsibility of the health care institution and practicing physician where this report was generated.  [employer] will not be held responsible for the validity of the ICD and CPT codes included on this report.  AMA assumes no liability for data contained or not contained herein. CPT is a Designer, television/film set of the [employer].   PATIENT NAME:  [name], [name] MR#: [id number]

## 2013-11-03 NOTE — Anesthesia Postprocedure Evaluation (Signed)
  Anesthesia Post-op Note  Patient: [name] [name]  Procedure(s) Performed: Procedure(s) with comments: ESOPHAGOGASTRODUODENOSCOPY (EGD) (N/A) - Botox - ballon dilation  Patient Location: PACU  Anesthesia Type:MAC  Level of Consciousness: awake and alert   Airway and Oxygen Therapy: Patient Spontanous Breathing  Post-op Pain: none  Post-op Assessment: Post-op Vital signs reviewed  Post-op Vital Signs: Reviewed and stable  Last Vitals:  Filed Vitals:   11/03/13 0830  BP: 117/65  Pulse: 60  Temp:   Resp: 15    Complications: No apparent anesthesia complications

## 2013-11-03 NOTE — Transfer of Care (Signed)
Immediate Anesthesia Transfer of Care Note  Patient: [name] [name]  Procedure(s) Performed: Procedure(s) with comments: ESOPHAGOGASTRODUODENOSCOPY (EGD) (N/A) - Botox - ballon dilation  Patient Location: Endo Recovery  Anesthesia Type:MAC  Level of Consciousness: Patient easily awoken, sedated, comfortable, cooperative, following commands, responds to stimulation.   Airway & Oxygen Therapy: Patient spontaneously breathing, ventilating well, oxygen via simple oxygen mask.  Post-op Assessment: Report given to PACU RN, vital signs reviewed and stable, moving all extremities.   Post vital signs: Reviewed and stable.  Complications: No apparent anesthesia complications

## 2013-11-03 NOTE — H&P (Signed)
History of Present Illness: [name] [name] is a pleasant 53 year old African-American female known to [name] [name] who has history of esophageal dyskinesia, esophageal stricture, chronic hepatitis C, adult onset diabetes mellitus, and hypertension.  She has had difficulty with recurrent dysphagia, odynophagia, episodes of chest pain and choking for the past several years. She has undergone multiple EGDs and had a manometry and pH study done at [facility] in 2013. She has been responsive to esophageal dilations and Botox injections which provide complete relief of her symptoms for generally 6-7 months up to a year. She had EGD done in November of 2014 was felt to have an early stricture which was balloon dilated and she also had Botox injections in 4 quadrants at the LES. She says she had complete relief of her symptoms up until April or May then she underwent another EGD by [name] [name] on 5/14 at which time he dilated her LES and injected Botox due to esophageal spasm at that area. After that procedure she did not have any relief of her symptoms, which was unusual compared to her previous good responses.  She was previously taking prevacid 30 mg daily, but was switched to Dexilant 60 mg daily at her visit here in early May prior to the EGD. The Dexilant was increased to BID for a couple of weeks, but then due to cost her PCP recently put her back on omeprazole 40 mg BID, which she just started one week ago. She says that her cardioloist mentioned that maybe Nexium would help more.  She is here again today with ongoing complaints of chest pain and esophageal spasm. Along with the omeprazole 40 mg BID, she is also taking carafate suspension four times per day, bentyl 20 mg twice a day alternating with levsin twice per day. She is taking diltiazem daily for her BP and heart as well as nitroglycerin prn.  Was recently hospitalized and had extensive cardiac evaluation. Had a CT angio abdomen and pelvis with and without contrast on  08/28/13 that was unremarkable.  Current Medications, Allergies, Past Medical History, Past Surgical History, Family History and Social History were reviewed in [employer] record.  Physical Exam:  BP 150/90  Pulse 72  Ht 5\' 5"  (1.651 m)  Wt 221 lb 8 oz (100.472 kg)  BMI 36.86 kg/m2  General: Well developed black female in no acute distress  Head: Normocephalic and atraumatic  Eyes: Sclerae anicteric, conjunctiva pink  Ears: Normal auditory acuity  Lungs: Clear throughout to auscultation  Heart: Regular rate and rhythm  Abdomen: Soft, non-distended. Normal bowel sounds. Non-tender.  Musculoskeletal: Symmetrical with no gross deformities  Extremities: No edema  Neurological: Alert oriented x 4, grossly nonfocal  Psychological: Alert and cooperative. Normal mood and affect  Assessment and Recommendations:  -GERD/esophageal spasm causing chest pain: Has been on several medications for her symptoms. No relief of her symptoms after last EGD with dilation and botox, but will re-attempt since we have almost exhausted medication regimens. Will schedule EGD with dilation and botox injection with [name] [name]. Continue omeprazole 40 mg BID in the interim along with carafate four times a day (refill sent to pharmacy), levsin and bentyl each twice a day (alternating the two). She also has diltiazem, which she takes daily for her heart/blood pressure and nitroglycerin prn.  The risks, benefits, and alternatives were discussed with the patient and she consents to proceed.  *Just of note, patient asked again about the lab that she had performed that showed a problem with  her small intestine. That study was a fractionated ALP with total ALP elevated to 144 with intestinal portion elevated at 19 (upper limits of normal is 18). As stated above, she's undergone extensvie GI evaluation. At this point, I do not think that the elevation has any great significance.

## 2013-11-04 ENCOUNTER — Encounter (HOSPITAL_COMMUNITY): Payer: Self-pay | Admitting: Gastroenterology

## 2013-11-18 ENCOUNTER — Other Ambulatory Visit: Payer: Self-pay | Admitting: Internal Medicine

## 2013-11-19 ENCOUNTER — Telehealth: Payer: Self-pay | Admitting: Gastroenterology

## 2013-11-19 NOTE — Telephone Encounter (Signed)
Pain has off and on sharp chest pain. She will cough a few times and it stops. She went to her pcp who advised her the lungs are clear and the ekg is normal. She describes the pain as being the same as before though she is overall much better. This is a new symptom. Please advise.

## 2013-11-19 NOTE — Telephone Encounter (Signed)
Would not make any changes in Rx for now

## 2013-11-20 ENCOUNTER — Encounter: Payer: Self-pay | Admitting: *Deleted

## 2013-11-20 NOTE — Telephone Encounter (Signed)
Pt advised. She will try some warmth to the chest to see if that helps.

## 2013-11-24 NOTE — Telephone Encounter (Signed)
Called to patient to ask if she is feeling better. Reminded of appointment 11/25/13 that she can cancel if she is improving.

## 2013-11-25 ENCOUNTER — Encounter: Payer: Self-pay | Admitting: Gastroenterology

## 2013-11-25 ENCOUNTER — Ambulatory Visit (INDEPENDENT_AMBULATORY_CARE_PROVIDER_SITE_OTHER): Payer: 59 | Admitting: Gastroenterology

## 2013-11-25 VITALS — BP 140/82 | HR 80 | Ht 65.0 in | Wt 232.0 lb

## 2013-11-25 DIAGNOSIS — K219 Gastro-esophageal reflux disease without esophagitis: Secondary | ICD-10-CM

## 2013-11-25 DIAGNOSIS — K224 Dyskinesia of esophagus: Secondary | ICD-10-CM

## 2013-11-25 DIAGNOSIS — R0789 Other chest pain: Secondary | ICD-10-CM

## 2013-11-25 MED ORDER — DILTIAZEM HCL 30 MG PO TABS: 30.0000 mg | ORAL_TABLET | Freq: Four times a day (QID) | ORAL | Status: DC

## 2013-11-25 NOTE — Progress Notes (Signed)
11/25/2013 SANSKRITI KADEL [id number] March 09, 1960   History of Present Illness:  [name] [name] is a pleasant 53 year old African-American female known to Dr. Deatra Ina who has history of esophageal dyskinesia, esophageal stricture, chronic hepatitis C, adult onset diabetes mellitus, and hypertension.  She has had difficulty with recurrent dysphagia, odynophagia, episodes of chest pain and choking for the past several years. She has undergone multiple EGDs and had a manometry and pH study done at [facility] in 2013. She had been responsive to esophageal dilations and Botox injections which provide complete relief of her symptoms for generally 6-7 months up to a year. She had EGD done in November of 2014 was felt to have an early stricture which was balloon dilated and she also had Botox injections in 4 quadrants at the LES. She says she had complete relief of her symptoms up until April or May then she underwent another EGD by Dr. Deatra Ina on 06/25/2013 at which time he dilated her LES and injected Botox due to esophageal spasm at that area.  After that procedure she did not have any relief of her symptoms, which was unusual compared to her previous good responses.  Her last EGD was then 11/03/2013 at which time the EGD was normal but her esophagus was empirically dilated and Botox was injected into the LES.  She is here again today complaining of recurrent chest pain.  Says that she had a lot of relief for about 2 weeks, but symptoms slowly started to recur and are now back to about what they were prior to the EGD in September.  She has been on several medications for these conditions as well.  She was previously taking prevacid 30 mg daily, but was switched to Dexilant 60 mg daily at her visit here in early May prior to the EGD.  The Dexilant was increased to BID for a couple of weeks, but then due to cost her PCP put her back on omeprazole 40 mg BID, which she is still currently taking.  She was previously taking  diltiazem daily for her BP and heart, but this was discontinued since her last visit here for unknown reason and was started on Norvasc 2.5 mg daily just recently (says that her PCP put her on this for her esophageal spasm).  She also has nitroglycerin which she has been taking more frequently recently for her chest pain and she says that it does help.  She is not the best historian.  Was recently hospitalized and had extensive cardiac evaluation.  Had a CT angio abdomen and pelvis with and without contrast on 08/28/13 that was unremarkable.   Current Medications, Allergies, Past Medical History, Past Surgical History, Family History and Social History were reviewed in [employer] record.   Physical Exam: BP 140/82  Pulse 80  Ht 5\' 5"  (1.651 m)  Wt 232 lb (105.235 kg)  BMI 38.61 kg/m2 General: Well developed black female in no acute distress Head: Normocephalic and atraumatic Eyes:  Sclerae anicteric, conjunctiva pink  Ears: Normal auditory acuity Lungs: Clear throughout to auscultation Heart: Regular rate and rhythm Abdomen: Soft, non-distended.  Normal bowel sounds.  Non-tender. Musculoskeletal: Symmetrical with no gross deformities  Extremities: No edema  Neurological: Alert oriented x 4, grossly non-focal Psychological:  Alert and cooperative. Normal mood and affect  Assessment and Recommendations: -GERD/esophageal spasm causing chest pain:  Has been on several medications for her symptoms.  She was previously on diltiazem but unsure why or when it was discontinued.  She was recently started on Norvasc instead.  We will discontinue the Norvasc and restart Diltiazem 30 mg four times daily.  She also has nitroglycerin which she uses as needed and is on omeprazole 40 mg BID.  Follow-up in 4 weeks.  We can increase the dose of the diltiazem depending on her symptom response and how well her BP tolerates the medication.

## 2013-11-25 NOTE — Patient Instructions (Signed)
We sent a prescription for Diltiazem HCL 30 mg tabs to [facility], [place], [place]. . Take 1 tab 4 times daily.   Stop the Norvasc.    We would like you to call us the last week of October and make an appointment for mid November with Janett Billow.  Ask for Jeffery Gammell when you call.  [phone]. We made you an appointment to follow up with Dr. Erskine Emery , December 10 at 2:15 PM.

## 2013-11-26 NOTE — Progress Notes (Signed)
Reviewed and agree with management. Robert D. Kaplan, M.D., FACG  

## 2013-11-27 ENCOUNTER — Telehealth: Payer: Self-pay | Admitting: Gastroenterology

## 2013-11-30 NOTE — Telephone Encounter (Signed)
Patient wanted to be sure this was documented. She states she is doing better now.

## 2013-12-08 ENCOUNTER — Other Ambulatory Visit: Payer: Self-pay | Admitting: Physician Assistant

## 2013-12-18 ENCOUNTER — Telehealth: Payer: Self-pay | Admitting: Gastroenterology

## 2013-12-18 NOTE — Telephone Encounter (Signed)
[name] [name] called back and said the chest pain is bad. She wants to be seen. I made her an appointment with Nicoletta Ba PA for 12-23-2013 at 2:00 PM I told her to go to the ER over the weekend if it gets severe.

## 2013-12-18 NOTE — Telephone Encounter (Signed)
I called and left a message for [facility] on cell phone.  I told her to call back and ask for me. I will make her an appointment for this next weekl

## 2013-12-23 ENCOUNTER — Encounter: Payer: Self-pay | Admitting: Physician Assistant

## 2013-12-23 ENCOUNTER — Ambulatory Visit (INDEPENDENT_AMBULATORY_CARE_PROVIDER_SITE_OTHER): Payer: 59 | Admitting: Physician Assistant

## 2013-12-23 VITALS — BP 122/82 | HR 88 | Ht 65.0 in | Wt 237.4 lb

## 2013-12-23 DIAGNOSIS — R0789 Other chest pain: Secondary | ICD-10-CM

## 2013-12-23 DIAGNOSIS — R1314 Dysphagia, pharyngoesophageal phase: Secondary | ICD-10-CM

## 2013-12-23 DIAGNOSIS — T17308D Unspecified foreign body in larynx causing other injury, subsequent encounter: Secondary | ICD-10-CM

## 2013-12-23 NOTE — Progress Notes (Signed)
Subjective:    Patient ID: [name] [name], female    DOB: 1960/03/28, 53 y.o.   MRN: [id number]  HPI  [name] [name] is a pleasant 53 year old Afro-American female known to Dr. Deatra Ina who has a working diagnosis of esophageal dyskinesia prior history of esophageal stricture, hepatitis C, adult-onset diabetes mellitus and hypertension. She has had problems with recurrent dysphagia odynophagia and chest pain several years and has had multiple endoscopies and dilations and Botox injections. She had a manometry done at [facility] in 2013 which was read as normal Because of persistent complaints she has had several endoscopies over the past couple of years with empiric dilations of the LES and Botox injections per Dr. Deatra Ina. She had EGD in May 2015 with dilation and Botox injections and did not get good relief compared to previous times. She says it best she has had relief of her symptoms for about 3 months and then they recur. She had repeat EGD done in September 2015 normal exam but empirically dilated again and Botox injection. She says she had relief of her symptoms for about 2 weeks and then recurrence. She was last seen in the office about a month ago, she had been off of diltiazem and diltiazem was restarted at 30 mg by mouth 4 times daily to see if this would help with esophageal spasms., She continues on omeprazole 40 mg twice a day and has used nitroglycerin when necessary for episodes of chest pain. She underwent a complete cardiac workup within the past year in [place] per Dr. Joylene Grapes and says she was actually hospitalized for several days underwent cardiac catheterization etc. And found no problems with her heart. She comes in today stating that she continues to have problems. On careful questioning she says most of the time she swallowing well but without difficulty but occasionally has an episode where she feels as if the food does not want to go down. Noticed any improvement in symptoms with diltiazem 4  times daily, she is also been taking Bentyl 4 times a day daily and Levsin on a regular basis again with no change in her symptoms. He uses nitroglycerin as needed for chest pain but is not sure this makes much difference either. Her appetite has been good and she's actually gained weight. She says that she's having episodes of stabbing lower chest pain which radiates upward into her chest and into her left arm about once or twice per month She also has been having some episodes of choking on her saliva. She says she's woken up twice recently coughing and with the strangling sensation.    Review of Systems  Constitutional: Negative.   HENT: Positive for trouble swallowing.   Eyes: Negative.   Respiratory: Positive for cough and choking.   Cardiovascular: Positive for chest pain.  Gastrointestinal: Negative.   Endocrine: Negative.   Genitourinary: Negative.   Musculoskeletal: Negative.   Allergic/Immunologic: Negative.   Hematological: Negative.   Psychiatric/Behavioral: Negative.    Outpatient Prescriptions Prior to Visit  Medication Sig Dispense Refill  . acetaminophen (TYLENOL) 500 MG tablet Take 1,000 mg by mouth 2 (two) times daily.    Marland Kitchen amLODipine (NORVASC) 2.5 MG tablet Take 2.5 mg by mouth daily.    Marland Kitchen dicyclomine (BENTYL) 20 MG tablet Take 20 mg by mouth every 6 (six) hours.    Marland Kitchen diltiazem (CARDIZEM) 30 MG tablet Take 1 tablet (30 mg total) by mouth 4 (four) times daily. 120 tablet 1  . glimepiride (AMARYL) 4 MG tablet  Take 4 mg by mouth 2 (two) times daily.    . hyoscyamine (LEVSIN SL) 0.125 MG SL tablet DISSOLVE ONE TABLET EVERY 4 HOURS AS NEEDED 30 tablet 0  . insulin detemir (LEVEMIR) 100 UNIT/ML injection Inject 19 Units into the skin at bedtime.     . insulin lispro (HUMALOG) 100 UNIT/ML injection Inject 20-30 Units into the skin 4 (four) times daily.    Marland Kitchen labetalol (NORMODYNE) 200 MG tablet Take 300 mg by mouth 2 (two) times daily.    Marland Kitchen lisinopril (PRINIVIL,ZESTRIL) 20 MG  tablet Take 20 mg by mouth 2 (two) times daily.    . nitroGLYCERIN (NITRODUR - DOSED IN MG/24 HR) 0.2 mg/hr patch Place 0.2 mg onto the skin daily. Apply when having chest pain    . nitroGLYCERIN (NITROSTAT) 0.4 MG SL tablet Place 0.4 mg under the tongue every 5 (five) minutes as needed for chest pain.    Marland Kitchen omeprazole (PRILOSEC) 40 MG capsule Take 40 mg by mouth 2 (two) times daily.    . sertraline (ZOLOFT) 25 MG tablet Take 25 mg by mouth every morning.     . hyoscyamine (LEVSIN SL) 0.125 MG SL tablet Place 0.125 mg under the tongue every 4 (four) hours as needed for cramping.      No facility-administered medications prior to visit.   Allergies  Allergen Reactions  . Codeine Itching    No hives or rash, takes benadryl  . Dilaudid [Hydromorphone Hcl] Itching    "mild itching hrs after dilaudid given"   Patient Active Problem List   Diagnosis Date Noted  . Esophageal spasm 11/25/2013  . Abnormal EKG 08/29/2013  . Hypertensive urgency 08/28/2013  . Chest pain 08/28/2013  . GERD (gastroesophageal reflux disease) 07/17/2013  . Diarrhea 07/17/2013  . Abdominal pain, unspecified site 07/17/2013  . Esophageal reflux 01/23/2013  . Gastric erosions 12/29/2012  . Stricture and stenosis of esophagus 12/29/2012  . DIABETES MELLITUS-TYPE II 09/05/2009  . Atypical chest pain 03/09/2009  . DYSPHAGIA 03/09/2009  . NAUSEA AND VOMITING 12/20/2008  . HEPATITIS C, CHRONIC 03/10/2008  . HYPERTENSION 03/10/2008  . BRONCHITIS, CHRONIC 03/10/2008  . Dyskinesia of esophagus 03/10/2008  . ARTHRITIS 03/10/2008   History  Substance Use Topics  . Smoking status: Never Smoker   . Smokeless tobacco: Never Used  . Alcohol Use: No      family history includes Alzheimer's disease in her father; Hypertension in her mother; Throat cancer (age of onset: 6) in her sister. There is no history of Colon cancer, Rectal cancer, or Stomach cancer.  Objective:   Physical Exam  Well-developed African-American  female in no acute distress, blood pressure 122/82 pulse 88 height 5 foot 5 weight 237. HEENT; nontraumatic normocephalic EOMI PERRLA sclera anicteric, Supple ;no JVD, Cardiovascular; regular rate and rhythm with S1-S2 no murmur rub or gallop, Pulmonary; clear bilaterally, Abdomen; soft nontender ,nondistended, bowel sounds are active no palpable mass or hepatosplenomegaly, Rectal; exam not done, Extremities; no clubbing cyanosis or edema skin warm and dry, Psych ;mood and affect appropriate       Assessment & Plan:  #36 53 year old African-American female with episodes of atypical chest pain, prior negative cardiac workup. These episodes are being attributed to esophageal spasm/esophageal dyskinesia however prior manometry was unremarkable. She has had several esophageal dilations and several EGDs with Botox, the last 2 procedures offered relief of symptoms for only very brief amounts of time. Her response thus far with regular dosing of antispasmodics and now with Cardizem though her  systolic blood pressure is down 20 points since last office visit. Since she has not noticed any change with addition of Cardizem I'm not sure the pushing the dose is appropriate. Plan; she will continue current dose of 30 mg of Cardizem 4 times daily Continue dicyclomine 10 mg 4 times daily.  Stop regular dosing of Levsin and use when necessary for chest pain . Continue omeprazole 40 mg twice daily reviewed an anti-reflux regimen again very strict including the need to elevate the head of the bed 45 Will schedule for a barium swallow, then after review can decide next step and we'll discuss with Dr. Deatra Ina.I briefly discussed possible referral to tertiary care center, she had been at [facility] previously and is not excited about returning there.

## 2013-12-23 NOTE — Patient Instructions (Signed)
You have been scheduled for a Barium Esophogram at [facility] (1st floor of the hospital) on 01-01-2014 at 10:30 am . Please arrive 10:15 am prior to your appointment for registration. Make certain not to have anything to eat or drink 3  hours prior to your test. If you need to reschedule for any reason, please contact radiology at [phone] to do so. Continue the same medications. Use Levsin under the tongue as needed for episodes of chest pain. Stay on the Prilosec 40 mg twice daily. __________________________________________________________________ A barium swallow is an examination that concentrates on views of the esophagus. This tends to be a double contrast exam (barium and two liquids which, when combined, create a gas to distend the wall of the oesophagus) or single contrast (non-ionic iodine based). The study is usually tailored to your symptoms so a good history is essential. Attention is paid during the study to the form, structure and configuration of the esophagus, looking for functional disorders (such as aspiration, dysphagia, achalasia, motility and reflux) EXAMINATION You may be asked to change into a gown, depending on the type of swallow being performed. A radiologist and radiographer will perform the procedure. The radiologist will advise you of the type of contrast selected for your procedure and direct you during the exam. You will be asked to stand, sit or lie in several different positions and to hold a small amount of fluid in your mouth before being asked to swallow while the imaging is performed .In some instances you may be asked to swallow barium coated marshmallows to assess the motility of a solid food bolus. The exam can be recorded as a digital or video fluoroscopy procedure. POST PROCEDURE It will take 1-2 days for the barium to pass through your system. To facilitate this, it is important, unless otherwise directed, to increase your fluids for the next  24-48hrs and to resume your normal diet.  This test typically takes about 30 minutes to perform. __________________________________________________________________________________

## 2013-12-24 NOTE — Progress Notes (Signed)
Patient had an esophageal manometry with provocative Tensilon testing about 14 years ago that demonstrated findings suggestive of esophageal spasm.  Most recently esophageal manometry was normal. I agree with assessment and plans

## 2014-01-01 ENCOUNTER — Ambulatory Visit (HOSPITAL_COMMUNITY)
Admission: RE | Admit: 2014-01-01 | Discharge: 2014-01-01 | Disposition: A | Discharge status: Home / Self Care | Payer: 59 | Source: Ambulatory Visit | Attending: Physician Assistant | Admitting: Physician Assistant

## 2014-01-01 DIAGNOSIS — K219 Gastro-esophageal reflux disease without esophagitis: Secondary | ICD-10-CM | POA: Diagnosis not present

## 2014-01-01 DIAGNOSIS — R1110 Vomiting, unspecified: Secondary | ICD-10-CM | POA: Diagnosis not present

## 2014-01-01 DIAGNOSIS — R0789 Other chest pain: Secondary | ICD-10-CM

## 2014-01-01 DIAGNOSIS — R1314 Dysphagia, pharyngoesophageal phase: Secondary | ICD-10-CM

## 2014-01-01 DIAGNOSIS — R079 Chest pain, unspecified: Secondary | ICD-10-CM | POA: Diagnosis present

## 2014-01-01 DIAGNOSIS — T17308D Unspecified foreign body in larynx causing other injury, subsequent encounter: Secondary | ICD-10-CM

## 2014-01-01 DIAGNOSIS — R1310 Dysphagia, unspecified: Secondary | ICD-10-CM | POA: Diagnosis present

## 2014-01-13 ENCOUNTER — Other Ambulatory Visit: Payer: Self-pay | Admitting: Physician Assistant

## 2014-01-21 ENCOUNTER — Ambulatory Visit: Payer: 59 | Admitting: Gastroenterology

## 2014-03-01 ENCOUNTER — Telehealth: Payer: Self-pay | Admitting: *Deleted

## 2014-03-01 ENCOUNTER — Encounter: Payer: Self-pay | Admitting: Gastroenterology

## 2014-03-01 ENCOUNTER — Ambulatory Visit (INDEPENDENT_AMBULATORY_CARE_PROVIDER_SITE_OTHER): Payer: 59 | Admitting: Gastroenterology

## 2014-03-01 VITALS — BP 128/86 | HR 84 | Ht 65.0 in | Wt 244.0 lb

## 2014-03-01 DIAGNOSIS — K224 Dyskinesia of esophagus: Secondary | ICD-10-CM

## 2014-03-01 NOTE — Progress Notes (Signed)
      History of Present Illness:  [name] [name] has returned for reevaluation of swallowing difficulties.  She continues to complain of immediate episodes of choking with swallowing.  It is a very intermittent problem.  He also has episodes of spontaneous chest pain She is distressed that I've asked for a second opinion since she feels that she has not received proper care at other institutions and is not taken seriously.  She did mention that she was told of her problem perhaps in the proximal esophagus causing her swallowing difficulties.  He was evaluated in 2014 at [facility] by Dr. Milderd Meager who concluded that chest pain is most likely related to acid and nonacid reflux.  High-dose PPI therapy and nitroglycerin when necessary was recommended.    Review of Systems: Pertinent positive and negative review of systems were noted in the above HPI section. All other review of systems were otherwise negative.    Current Medications, Allergies, Past Medical History, Past Surgical History, Family History and Social History were reviewed in [place] record  Vital signs were reviewed in today's medical record. Physical Exam: General: Well developed , well nourished, no acute distress   See Assessment and Plan under Problem List

## 2014-03-01 NOTE — Patient Instructions (Signed)
Follow up as needed

## 2014-03-01 NOTE — Telephone Encounter (Signed)
===  View-only below this line===  ----- Message -----    From: Inda Castle, MD    Sent: 03/01/2014  12:11 PM      To: Oda Kilts, CMA  Please make an appointment for her with Dr. Milderd Meager at [facility] her to increase omeprazole to 40 mg twice a day.  Carefully explain to her that I am seeking another opinion regarding her chest pain and choking but that I will continue to take care of her.  In particular, I am raising the question to Dr. Derrill Kay whether she has some abnormality in the muscles in her proximal esophagus.

## 2014-03-01 NOTE — Assessment & Plan Note (Addendum)
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  Workup at [facility] in 2014 concluded that chest pain was due to acid and non-acid reflux.  This would not necessarily explain her episodes of acute choking with swallowing.  There may be a functional component as well.  Recommendations #1 increase omeprazole to 40 mg twice a day #2 nitroglycerin sublingual when necessary pain #3 I will refer her back to Dr. Milderd Meager for reevaluation including consideration of cricopharyngeal dysfunction.

## 2014-03-03 MED ORDER — OMEPRAZOLE 40 MG PO CPDR: 40.0000 mg | DELAYED_RELEASE_CAPSULE | Freq: Two times a day (BID) | ORAL | Status: DC

## 2014-03-03 NOTE — Telephone Encounter (Signed)
No further studies at this time.  Wait until she is seen by Dr. Derrill Kay for any further recommendations.

## 2014-03-03 NOTE — Telephone Encounter (Signed)
Called patient to inform to take Omeprazole twice a day   And we would refer her back to see Dr Derrill Kay for another opinion  Dr Deatra Ina she was asking me questions about a 24 hour study?? Not sure if she meant she needs this scheduled or results. I was thinking she was talking about a Manometry study

## 2014-03-04 NOTE — Telephone Encounter (Signed)
FYI Dr Deatra Ina Appointment is scheduled on 06/14/2014 at 9:30am with Dr Roney Mans   Pt no showed her past appt scheduled on 02/22/2014 with Dr Derrill Kay

## 2014-03-08 NOTE — Telephone Encounter (Signed)
Please emphasized to her the importance for showing up at the appointment.

## 2014-06-14 DIAGNOSIS — R0989 Other specified symptoms and signs involving the circulatory and respiratory systems: Secondary | ICD-10-CM | POA: Insufficient documentation

## 2014-06-14 DIAGNOSIS — R09A2 Foreign body sensation, throat: Secondary | ICD-10-CM | POA: Insufficient documentation

## 2015-07-21 ENCOUNTER — Other Ambulatory Visit: Payer: Self-pay | Admitting: Gastroenterology

## 2015-07-30 ENCOUNTER — Other Ambulatory Visit: Payer: Self-pay | Admitting: Gastroenterology

## 2015-11-09 DIAGNOSIS — E7800 Pure hypercholesterolemia, unspecified: Secondary | ICD-10-CM | POA: Insufficient documentation

## 2015-12-05 DIAGNOSIS — G4762 Sleep related leg cramps: Secondary | ICD-10-CM | POA: Insufficient documentation

## 2015-12-13 ENCOUNTER — Ambulatory Visit (INDEPENDENT_AMBULATORY_CARE_PROVIDER_SITE_OTHER): Payer: BLUE CROSS/BLUE SHIELD | Admitting: Gastroenterology

## 2015-12-13 ENCOUNTER — Encounter: Payer: Self-pay | Admitting: Gastroenterology

## 2015-12-13 VITALS — BP 140/80 | HR 84 | Ht 65.0 in | Wt 247.0 lb

## 2015-12-13 DIAGNOSIS — R0789 Other chest pain: Secondary | ICD-10-CM

## 2015-12-13 DIAGNOSIS — K224 Dyskinesia of esophagus: Secondary | ICD-10-CM | POA: Diagnosis not present

## 2015-12-13 DIAGNOSIS — R1319 Other dysphagia: Secondary | ICD-10-CM | POA: Diagnosis not present

## 2015-12-13 NOTE — Progress Notes (Signed)
 GI Progress Note  Chief Complaint: Dysphagia  Subjective  History:   APP note from Nov 3210: #26 55 year old African-American female with episodes of atypical chest pain, prior negative cardiac workup. These episodes are being attributed to esophageal spasm/esophageal dyskinesia however prior manometry was unremarkable. She has had several esophageal dilations and several EGDs with Botox, the last 2 procedures offered relief of symptoms for only very brief amounts of time. Her response thus far with regular dosing of antispasmodics and now with Cardizem though her systolic blood pressure is down 20 points since last office visit. Since she has not noticed any change with addition of Cardizem I'm not sure the pushing the dose is appropriate. Plan; she will continue current dose of 30 mg of Cardizem 4 times daily Continue dicyclomine 10 mg 4 times daily.  Stop regular dosing of Levsin and use when necessary for chest pain . Continue omeprazole 40 mg twice daily reviewed an anti-reflux regimen again very strict including the need to elevate the head of the bed 45 Will schedule for a barium swallow, then after review can decide next step and we'll discuss with Dr. Deatra Ina.I briefly discussed possible referral to tertiary care center, she had been at [facility] previously and is not excited about returning there.  Normal barium esophagram Deatra Ina did EGD with balloon and botox after normal EGD  The patient reports she had reasonably good of dysphagia and chest pain until a few months ago. She says "I saw a lot of doctors, had a lot of tests, and when a lot of places. Only Botox with Dr. Deatra Ina was able to make me better, even though I don't know why". Her appetite has apparently been good and her weight stable. ROS: Cardiovascular:  no chest pain Respiratory: no dyspnea  The patient's Past Medical, Family and Social History were reviewed and are on file in the EMR.  Objective:  Med  list reviewed  Vital signs in last 24 hrs: Vitals:   12/13/15 1147  BP: 140/80  Pulse: 84    Physical Exam    HEENT: sclera anicteric, oral mucosa moist without lesions  Neck: supple, no thyromegaly, JVD or lymphadenopathy  Cardiac: RRR without murmurs, S1S2 heard, no peripheral edema  Pulm: clear to auscultation bilaterally, normal RR and effort noted  Abdomen: soft,Obese, No tenderness, with active bowel sounds. No guarding or palpable hepatosplenomegaly.  Skin; warm and dry, no jaundice or rash   @ASSESSMENTPLANBEGIN @ Assessment: Encounter Diagnoses  Name Primary?  . DYSPHAGIA Yes  . Dyskinesia of esophagus   . Atypical chest pain     She seems to have esophageal motility disorder but not clearly achalasia or anything and has been defined on motility study. Therefore, despite her history, it is not clear to me that Botox is truly indicated in this scenario.  Plan:  Esophageal Motility study   Total time 30 minutes, over half spent in chart review, counseling and coordination of care.   Nelida Meuse III

## 2015-12-13 NOTE — Patient Instructions (Signed)
If you are age 54 or older, your body mass index should be between 23-30. Your Body mass index is 41.1 kg/m. If this is out of the aforementioned range listed, please consider follow up with your Primary Care Provider.  If you are age 34 or younger, your body mass index should be between 19-25. Your Body mass index is 41.1 kg/m. If this is out of the aformentioned range listed, please consider follow up with your Primary Care Provider.   You have been scheduled for an esophageal manometry at [facility] on 12-26-2015 at 830 am. Please arrive 30 minutes prior to your procedure for registration. You will need to go to outpatient registration (1st floor of the hospital) first. Make certain to bring your insurance cards as well as a complete list of medications.  Please remember the following:  1) Do not take any muscle relaxants, xanax (alprazolam) or ativan for 1 day prior to your     test as well as the day of the test.  2) Nothing to eat or drink after 12:00 midnight on the night before your test.  3) Hold all diabetic medications/insulin the morning of the test. You may eat and take  your medications after the test.  It will take at least 2 weeks to receive the results of this test from your physician. ------------------------------------------ ABOUT ESOPHAGEAL MANOMETRY Esophageal manometry (muh-NOM-uh-tree) is a test that gauges how well your esophagus works. Your esophagus is the long, muscular tube that connects your throat to your stomach. Esophageal manometry measures the rhythmic muscle contractions (peristalsis) that occur in your esophagus when you swallow. Esophageal manometry also measures the coordination and force exerted by the muscles of your esophagus.  During esophageal manometry, a thin, flexible tube (catheter) that contains sensors is passed through your nose, down your esophagus and into your stomach. Esophageal manometry can be helpful in diagnosing some mostly  uncommon disorders that affect your esophagus.  Why it's done Esophageal manometry is used to evaluate the movement (motility) of food through the esophagus and into the stomach. The test measures how well the circular bands of muscle (sphincters) at the top and bottom of your esophagus open and close, as well as the pressure, strength and pattern of the wave of esophageal muscle contractions that moves food along.  What you can expect Esophageal manometry is an outpatient procedure done without sedation. Most people tolerate it well. You may be asked to change into a hospital gown before the test starts.  During esophageal manometry  While you are sitting up, a member of your health care team sprays your throat with a numbing medication or puts numbing gel in your nose or both.  A catheter is guided through your nose into your esophagus. The catheter may be sheathed in a water-filled sleeve. It doesn't interfere with your breathing. However, your eyes may water, and you may gag. You may have a slight nosebleed from irritation.  After the catheter is in place, you may be asked to lie on your back on an exam table, or you may be asked to remain seated.  You then swallow small sips of water. As you do, a computer connected to the catheter records the pressure, strength and pattern of your esophageal muscle contractions.  During the test, you'll be asked to breathe slowly and smoothly, remain as still as possible, and swallow only when you're asked to do so.  A member of your health care team may move the catheter down  into your stomach while the catheter continues its measurements.  The catheter then is slowly withdrawn. The test usually lasts 20 to 30 minutes.  After esophageal manometry  When your esophageal manometry is complete, you may return to your normal activities  This test typically takes 30-45 minutes to complete.  Thank you for choosing [facility]  Dr Wilfrid Lund  III  ________________________________________________________________________________

## 2015-12-25 DIAGNOSIS — E1140 Type 2 diabetes mellitus with diabetic neuropathy, unspecified: Secondary | ICD-10-CM

## 2015-12-25 HISTORY — DX: Type 2 diabetes mellitus with diabetic neuropathy, unspecified: E11.40

## 2015-12-26 ENCOUNTER — Encounter (HOSPITAL_COMMUNITY)
Admission: RE | Disposition: A | Discharge status: Home / Self Care | Payer: Self-pay | Source: Ambulatory Visit | Attending: Gastroenterology

## 2015-12-26 ENCOUNTER — Ambulatory Visit (HOSPITAL_COMMUNITY)
Admission: RE | Admit: 2015-12-26 | Discharge: 2015-12-26 | Disposition: A | Discharge status: Home / Self Care | Payer: BLUE CROSS/BLUE SHIELD | Source: Ambulatory Visit | Attending: Gastroenterology | Admitting: Gastroenterology

## 2015-12-26 DIAGNOSIS — R1310 Dysphagia, unspecified: Secondary | ICD-10-CM | POA: Diagnosis not present

## 2015-12-26 DIAGNOSIS — Z23 Encounter for immunization: Secondary | ICD-10-CM | POA: Insufficient documentation

## 2015-12-26 HISTORY — PX: ESOPHAGEAL MANOMETRY: SHX5429

## 2015-12-26 SURGERY — MANOMETRY, ESOPHAGUS

## 2015-12-26 MED ORDER — LIDOCAINE VISCOUS 2 % MT SOLN
OROMUCOSAL | Status: AC
  Filled 2015-12-26: qty 15

## 2015-12-26 SURGICAL SUPPLY — 2 items
FACESHIELD LNG OPTICON STERILE (SAFETY) IMPLANT
GLOVE BIO SURGEON STRL SZ8 (GLOVE) ×4 IMPLANT

## 2015-12-26 NOTE — Progress Notes (Signed)
Pt showed up for Esophageal Manometry after going to the wrong hospital. Esophageal Manometry done per protocol. Pt tolerated well without distress. Report to be sent to Dr. Loletha Carrow' office.

## 2015-12-26 NOTE — H&P (Signed)
History:  This patient presents for endoscopic testing for dysphagia.  Simmie Davies Referring physician: [name], MD  Past Medical History: Past Medical History:  Diagnosis Date  . Anxiety   . Arthritis    "hips" (08/28/2013)  . Chronic lower back pain    10-27-13 not a problem at this time  . Depression   . Esophageal spasm   . Frequent UTI   . GERD (gastroesophageal reflux disease)   . Hepatitis C   . High cholesterol   . Hypertension   . Migraine    "monthly" (08/28/2013)  . Pneumonia    "several times"  . Type II diabetes mellitus ([place])   . Ulcer ([place])    hx gastric ulcers     Past Surgical History: Past Surgical History:  Procedure Laterality Date  . ABDOMINAL HYSTERECTOMY    . APPENDECTOMY    . BALLOON DILATION N/A 12/29/2012   Procedure: BALLOON DILATION;  Surgeon: Inda Castle, MD;  Location: [facility];  Service: Endoscopy;  Laterality: N/A;  . BALLOON DILATION N/A 06/25/2013   Procedure: BALLOON DILATION;  Surgeon: Inda Castle, MD;  Location: [facility];  Service: Endoscopy;  Laterality: N/A;  . BOTOX INJECTION  12/29/2012   Procedure: BOTOX INJECTION;  Surgeon: Inda Castle, MD;  Location: [facility];  Service: Endoscopy;;  . BOTOX INJECTION N/A 06/25/2013   Procedure: BOTOX INJECTION;  Surgeon: Inda Castle, MD;  Location: [facility];  Service: Endoscopy;  Laterality: N/A;  . CARDIAC CATHETERIZATION  X 2   /notes 08/28/2013  . CARPAL TUNNEL RELEASE Right   . CHOLECYSTECTOMY    . COLONOSCOPY  04/10/2012   Normal   . ESOPHAGOGASTRODUODENOSCOPY N/A 12/29/2012   Procedure: ESOPHAGOGASTRODUODENOSCOPY (EGD);  Surgeon: Inda Castle, MD;  Location: [facility];  Service: Endoscopy;  Laterality: N/A;  . ESOPHAGOGASTRODUODENOSCOPY N/A 06/25/2013   Procedure: ESOPHAGOGASTRODUODENOSCOPY (EGD);  Surgeon: Inda Castle, MD;  Location: [facility];  Service: Endoscopy;  Laterality: N/A;  . ESOPHAGOGASTRODUODENOSCOPY N/A 11/03/2013   Procedure: ESOPHAGOGASTRODUODENOSCOPY (EGD);  Surgeon: Inda Castle, MD;  Location: [facility];  Service: Endoscopy;  Laterality: N/A;  Botox - ballon dilation  . FOOT FRACTURE SURGERY Right   . HEEL SPUR EXCISION Right   . TONSILLECTOMY    . TUBAL LIGATION      Allergies: Allergies  Allergen Reactions  . Codeine Itching    No hives or rash, takes benadryl  . Dilaudid [Hydromorphone Hcl] Itching    "mild itching hrs after dilaudid given"    Outpatient Meds: No current facility-administered medications for this encounter.       ___________________________________________________________________ Objective   Exam:  There were no vitals taken for this visit.  There is no physician contact with the patient for this procedure.  It is performed by the endoscopy nurse.   Assessment:  dysphagia  Plan:  Esophageal manometry   Nelida Meuse III

## 2015-12-26 NOTE — Progress Notes (Signed)
Pt did not show up for Manometry today at 830. Called and left a message for patient to reschedule due to no answer at house.

## 2015-12-27 ENCOUNTER — Encounter (HOSPITAL_COMMUNITY): Payer: Self-pay | Admitting: Gastroenterology

## 2016-01-19 ENCOUNTER — Telehealth: Payer: Self-pay | Admitting: Gastroenterology

## 2016-01-19 NOTE — Telephone Encounter (Signed)
Left message for patient to call back  

## 2016-01-20 NOTE — Telephone Encounter (Signed)
Returning call regarding test results.

## 2016-01-24 NOTE — Progress Notes (Signed)
Letter mailed

## 2016-02-23 ENCOUNTER — Telehealth: Payer: Self-pay | Admitting: Physician Assistant

## 2016-02-23 NOTE — Telephone Encounter (Signed)
Results reviewed with pt

## 2016-03-20 ENCOUNTER — Other Ambulatory Visit (HOSPITAL_COMMUNITY): Payer: Self-pay | Admitting: Nurse Practitioner

## 2016-03-20 DIAGNOSIS — B182 Chronic viral hepatitis C: Secondary | ICD-10-CM

## 2016-04-04 ENCOUNTER — Ambulatory Visit (HOSPITAL_COMMUNITY)
Admission: RE | Admit: 2016-04-04 | Discharge: 2016-04-04 | Disposition: A | Discharge status: Home / Self Care | Payer: BLUE CROSS/BLUE SHIELD | Source: Ambulatory Visit | Attending: Nurse Practitioner | Admitting: Nurse Practitioner

## 2016-04-04 DIAGNOSIS — B182 Chronic viral hepatitis C: Secondary | ICD-10-CM | POA: Diagnosis present

## 2016-04-04 DIAGNOSIS — Z9049 Acquired absence of other specified parts of digestive tract: Secondary | ICD-10-CM | POA: Diagnosis not present

## 2016-08-12 ENCOUNTER — Inpatient Hospital Stay (HOSPITAL_COMMUNITY)
Admission: AD | Admit: 2016-08-12 | Discharge: 2016-08-15 | DRG: 377 | Disposition: A | Discharge status: Home / Self Care | Payer: BLUE CROSS/BLUE SHIELD | Source: Other Acute Inpatient Hospital | Attending: Internal Medicine | Admitting: Internal Medicine

## 2016-08-12 DIAGNOSIS — T39395A Adverse effect of other nonsteroidal anti-inflammatory drugs [NSAID], initial encounter: Secondary | ICD-10-CM | POA: Diagnosis present

## 2016-08-12 DIAGNOSIS — F419 Anxiety disorder, unspecified: Secondary | ICD-10-CM | POA: Diagnosis present

## 2016-08-12 DIAGNOSIS — K859 Acute pancreatitis without necrosis or infection, unspecified: Secondary | ICD-10-CM | POA: Diagnosis present

## 2016-08-12 DIAGNOSIS — R195 Other fecal abnormalities: Secondary | ICD-10-CM | POA: Diagnosis not present

## 2016-08-12 DIAGNOSIS — K253 Acute gastric ulcer without hemorrhage or perforation: Secondary | ICD-10-CM | POA: Diagnosis not present

## 2016-08-12 DIAGNOSIS — B1920 Unspecified viral hepatitis C without hepatic coma: Secondary | ICD-10-CM | POA: Diagnosis present

## 2016-08-12 DIAGNOSIS — N183 Chronic kidney disease, stage 3 (moderate): Secondary | ICD-10-CM | POA: Diagnosis present

## 2016-08-12 DIAGNOSIS — K2970 Gastritis, unspecified, without bleeding: Secondary | ICD-10-CM | POA: Diagnosis present

## 2016-08-12 DIAGNOSIS — M160 Bilateral primary osteoarthritis of hip: Secondary | ICD-10-CM | POA: Diagnosis present

## 2016-08-12 DIAGNOSIS — M545 Low back pain: Secondary | ICD-10-CM | POA: Diagnosis present

## 2016-08-12 DIAGNOSIS — E1122 Type 2 diabetes mellitus with diabetic chronic kidney disease: Secondary | ICD-10-CM | POA: Diagnosis present

## 2016-08-12 DIAGNOSIS — K921 Melena: Secondary | ICD-10-CM | POA: Diagnosis present

## 2016-08-12 DIAGNOSIS — K254 Chronic or unspecified gastric ulcer with hemorrhage: Principal | ICD-10-CM | POA: Diagnosis present

## 2016-08-12 DIAGNOSIS — G8929 Other chronic pain: Secondary | ICD-10-CM | POA: Diagnosis present

## 2016-08-12 DIAGNOSIS — R1033 Periumbilical pain: Secondary | ICD-10-CM | POA: Diagnosis not present

## 2016-08-12 DIAGNOSIS — E1149 Type 2 diabetes mellitus with other diabetic neurological complication: Secondary | ICD-10-CM | POA: Diagnosis present

## 2016-08-12 DIAGNOSIS — I129 Hypertensive chronic kidney disease with stage 1 through stage 4 chronic kidney disease, or unspecified chronic kidney disease: Secondary | ICD-10-CM | POA: Diagnosis present

## 2016-08-12 DIAGNOSIS — D500 Iron deficiency anemia secondary to blood loss (chronic): Secondary | ICD-10-CM | POA: Diagnosis present

## 2016-08-12 DIAGNOSIS — Z87440 Personal history of urinary (tract) infections: Secondary | ICD-10-CM

## 2016-08-12 DIAGNOSIS — K922 Gastrointestinal hemorrhage, unspecified: Secondary | ICD-10-CM | POA: Diagnosis present

## 2016-08-12 DIAGNOSIS — N179 Acute kidney failure, unspecified: Secondary | ICD-10-CM | POA: Diagnosis present

## 2016-08-12 DIAGNOSIS — F329 Major depressive disorder, single episode, unspecified: Secondary | ICD-10-CM | POA: Diagnosis present

## 2016-08-12 DIAGNOSIS — L84 Corns and callosities: Secondary | ICD-10-CM | POA: Diagnosis present

## 2016-08-12 DIAGNOSIS — R300 Dysuria: Secondary | ICD-10-CM | POA: Diagnosis present

## 2016-08-12 DIAGNOSIS — R079 Chest pain, unspecified: Secondary | ICD-10-CM

## 2016-08-12 DIAGNOSIS — K222 Esophageal obstruction: Secondary | ICD-10-CM | POA: Diagnosis present

## 2016-08-12 DIAGNOSIS — I10 Essential (primary) hypertension: Secondary | ICD-10-CM | POA: Diagnosis present

## 2016-08-12 DIAGNOSIS — Z79899 Other long term (current) drug therapy: Secondary | ICD-10-CM

## 2016-08-12 DIAGNOSIS — K219 Gastro-esophageal reflux disease without esophagitis: Secondary | ICD-10-CM | POA: Diagnosis present

## 2016-08-12 DIAGNOSIS — R109 Unspecified abdominal pain: Secondary | ICD-10-CM | POA: Diagnosis present

## 2016-08-12 DIAGNOSIS — B182 Chronic viral hepatitis C: Secondary | ICD-10-CM

## 2016-08-12 DIAGNOSIS — R1084 Generalized abdominal pain: Secondary | ICD-10-CM | POA: Diagnosis not present

## 2016-08-12 MED ORDER — INSULIN ASPART 100 UNIT/ML ~~LOC~~ SOLN
0.0000 [IU] | SUBCUTANEOUS | Status: DC
  Administered 2016-08-13: 9 [IU] via SUBCUTANEOUS
  Administered 2016-08-13: 3 [IU] via SUBCUTANEOUS
  Administered 2016-08-13: 7 [IU] via SUBCUTANEOUS
  Administered 2016-08-13: 2 [IU] via SUBCUTANEOUS
  Administered 2016-08-13 (×2): 5 [IU] via SUBCUTANEOUS
  Administered 2016-08-14 (×3): 2 [IU] via SUBCUTANEOUS
  Administered 2016-08-14 (×3): 3 [IU] via SUBCUTANEOUS
  Administered 2016-08-15 (×2): 2 [IU] via SUBCUTANEOUS
  Administered 2016-08-15: 3 [IU] via SUBCUTANEOUS
  Administered 2016-08-15: 2 [IU] via SUBCUTANEOUS

## 2016-08-12 NOTE — H&P (Signed)
[name] [name] BOF:[id number] DOB: [birth year] DOA: 08/12/2016     PCP: [name], MD   Outpatient Specialists: Leanora Ivanoff in the past, now  [facility], [place]  Patient coming from:   home Lives   With family    Chief Complaint: One-week history of abdominal pain  HPI: [name] [name] is a 56 y.o. female with medical history significant of  depression frequent UTIs esophageal spasm GERD history of hepatitis C hypercholesteremia, HTN type 2 diabetes peptic ulcer disease  Presented with almost 2 month hx  of abdominal pain periumbelical  radiating to the back sharp and stabbing, started 2 weeks before she started on Harvoni. Reports abdominal pain radiating to her right leg worse with sitting up.  Patient took Tylenol and IBuprofen. She have had some dysuria and possible blood in urine. Urgent care gave her AZO with no improvement. Did not help the pain. Reports nausea when she is eating. Feels light headed worse with standing up. Reports she was evaluated for the same Last week at [facility] they had a CT done and it was nonacute. She had CT abdomen ordered by her PCP 2 weeks ago states with out contrast also non acute. She does not look at her stools but states at [facility] she was found to have melena and was positive for blood.   Her hemoglobin went from 11.7 to 9 over the past 1 week.  She was evaluated and an Oval [place]sey LFTs found to be within normal limits but lipase elevated 716 she was given 80 mg of Protonix and sent to [facility] for evaluation of possible Gi bleed.  She has been seen by Dr. Deatra Ina with GI in the past for esophageal botox injections she tried to go back but was told that she will not be needing those injections anymore. She has history of gastric ulcers as per EGD 2014   Regarding pertinent Chronic problems: [facility] history with esophageal spasms causing chest pain has had Botox injections in her esophagus History of diabetes on sliding  scale and Lantus 20 units, complicated by neuropathy. History of Hep C on Harvoni  IN ER:  No data recorded.      on arrival  ED Triage Vitals  Enc Vitals Group     BP      Pulse      Resp      Temp      Temp src      SpO2      Weight      Height      Head Circumference      Peak Flow      Pain Score      Pain Loc      Pain Edu?      Excl. in [place]?    Patient initially presented to [facility] ER but  was transferred to [facility] due to lack of GI coverage for the week   At [facility]: WBC 10.2 Hg 9.0 Down from 11.7 1 week ago plt 198 ABG 7.340/35/85 Na 130 K 5.0 bicarb20 AG 16Cr 1.8Glucose 572 Lipase 716 Total bili 0.4  AST 28 ALT 27 Alk.phosph166 UA ketones neg, glucose and protein elevated Following Medications were ordered in ER:   HAd CT abd last week per reprots ws non acute  ER provider discussed case with:  Dr. Alcario Drought who has accepted patient  Hospitalist was called for admission for Possible Gi bleed melena hem positive stools  Review  of Systems:    Pertinent positives include: melena  Constitutional:  No weight loss, night sweats, Fevers, chills, fatigue, weight loss  HEENT:  No headaches, Difficulty swallowing,Tooth/dental problems,Sore throat,  No sneezing, itching, ear ache, nasal congestion, post nasal drip,  Cardio-vascular:  No chest pain, Orthopnea, PND, anasarca, dizziness, palpitations.no Bilateral lower extremity swelling  GI:  No heartburn, indigestion, abdominal pain, nausea, vomiting, diarrhea, change in bowel habits, loss of appetite,  blood in stool, hematemesis Resp:  no shortness of breath at rest. No dyspnea on exertion, No excess mucus, no productive cough, No non-productive cough, No coughing up of blood.No change in color of mucus.No wheezing. Skin:  no rash or lesions. No jaundice GU:  no dysuria, change in color of urine, no urgency or frequency. No straining to urinate.  No flank pain.  Musculoskeletal:  No joint pain or no  joint swelling. No decreased range of motion. No back pain.  Psych:  No change in mood or affect. No depression or anxiety. No memory loss.  Neuro: no localizing neurological complaints, no tingling, no weakness, no double vision, no gait abnormality, no slurred speech, no confusion  As per HPI otherwise 10 point review of systems negative.   Past Medical History: Past Medical History:  Diagnosis Date  . Anxiety   . Arthritis    "hips" (08/28/2013)  . Chronic lower back pain    10-27-13 not a problem at this time  . Depression   . Esophageal spasm   . Frequent UTI   . GERD (gastroesophageal reflux disease)   . Hepatitis C   . High cholesterol   . Hypertension   . Migraine    "monthly" (08/28/2013)  . Pneumonia    "several times"  . Type II diabetes mellitus ([place])   . Ulcer ([place])    hx gastric ulcers   Past Surgical History:  Procedure Laterality Date  . ABDOMINAL HYSTERECTOMY    . APPENDECTOMY    . BALLOON DILATION N/A 12/29/2012   Procedure: BALLOON DILATION;  Surgeon: Inda Castle, MD;  Location: [facility];  Service: Endoscopy;  Laterality: N/A;  . BALLOON DILATION N/A 06/25/2013   Procedure: BALLOON DILATION;  Surgeon: Inda Castle, MD;  Location: [facility];  Service: Endoscopy;  Laterality: N/A;  . BOTOX INJECTION  12/29/2012   Procedure: BOTOX INJECTION;  Surgeon: Inda Castle, MD;  Location: [facility] END[facility]COPY;  Service: Endoscopy;;  . BOTOX INJECTION N/A 06/25/2013   Procedure: BOTOX INJECTION;  Surgeon: Inda Castle, MD;  Location: [facility];  Service: Endoscopy;  Laterality: N/A;  . CARDIAC CATHETERIZATION  X 2   /notes 08/28/2013  . CARPAL TUNNEL RELEASE Right   . CHOLECYSTECTOMY    . COLONOSCOPY  04/10/2012   Normal   . ESOPHAGEAL MANOMETRY N/A 12/26/2015   Procedure: ESOPHAGEAL MANOMETRY (EM);  Surgeon: Doran Stabler, MD;  Location: [facility];  Service: Gastroenterology;  Laterality: N/A;  . ESOPHAGOGASTRODUODENOSCOPY N/A 12/29/2012    Procedure: ESOPHAGOGASTRODUODENOSCOPY (EGD);  Surgeon: Inda Castle, MD;  Location: [facility];  Service: Endoscopy;  Laterality: N/A;  . ESOPHAGOGASTRODUODENOSCOPY N/A 06/25/2013   Procedure: ESOPHAGOGASTRODUODENOSCOPY (EGD);  Surgeon: Inda Castle, MD;  Location: [facility];  Service: Endoscopy;  Laterality: N/A;  . ESOPHAGOGASTRODUODENOSCOPY N/A 11/03/2013   Procedure: ESOPHAGOGASTRODUODENOSCOPY (EGD);  Surgeon: Inda Castle, MD;  Location: [facility];  Service: Endoscopy;  Laterality: N/A;  Botox - ballon dilation  . FOOT FRACTURE SURGERY Right   . HEEL SPUR EXCISION Right   .  TONSILLECTOMY    . TUBAL LIGATION       Social History:  Ambulatory  independently    reports that she has never smoked. She has never used smokeless tobacco. She reports that she does not drink alcohol or use drugs.  Allergies:   Allergies  Allergen Reactions  . Codeine Itching    No hives or rash, takes benadryl  . Dilaudid [Hydromorphone Hcl] Itching    "mild itching hrs after dilaudid given"    Family History:   Family History  Problem Relation Age of Onset  . Hypertension Mother   . Alzheimer's disease Father   . Throat cancer Sister 69       died at age 46  . Colon cancer Neg Hx   . Rectal cancer Neg Hx   . Stomach cancer Neg Hx     Medications: Prior to Admission medications   Medication Sig Start Date End Date Taking? Authorizing Provider  acetaminophen (TYLENOL) 500 MG tablet Take 1,000 mg by mouth 2 (two) times daily.    [provider]  cloNIDine (CATAPRES) 0.1 MG tablet Take 0.1 mg by mouth 2 (two) times daily.    [provider]  dicyclomine (BENTYL) 20 MG tablet Take 20 mg by mouth every 6 (six) hours.    [provider]  insulin glargine (LANTUS) 100 unit/mL SOPN Inject 20 Units into the skin at bedtime.    [provider]  insulin lispro (HUMALOG) 100 UNIT/ML injection Inject 20-30 Units into the skin 4 (four) times daily.     [provider]  labetalol (NORMODYNE) 200 MG tablet Take 300 mg by mouth 2 (two) times daily.    [provider]  lisinopril (PRINIVIL,ZESTRIL) 20 MG tablet Take 20 mg by mouth 2 (two) times daily.    [provider]  nitroGLYCERIN (NITRODUR - DOSED IN MG/24 HR) 0.2 mg/hr patch Place 0.2 mg onto the skin daily as needed. Apply when having chest pain    [provider]  nitroGLYCERIN (NITROSTAT) 0.4 MG SL tablet Place 0.4 mg under the tongue every 5 (five) minutes as needed for chest pain.    [provider]  omeprazole (PRILOSEC) 40 MG capsule Take 1 capsule (40 mg total) by mouth 2 (two) times daily. 03/03/14   Inda Castle, MD  sertraline (ZOLOFT) 25 MG tablet Take 25 mg by mouth every morning.     [provider]    Physical Exam: No data found.   1. General:  in No Acute distress 2. Psychological: Alert and  Oriented 3. Head/ENT:    Dry Mucous Membranes                          Head Non traumatic, neck supple                           Poor Dentition 4. SKIN:  decreased Skin turgor,  Skin clean Dry and intact no rash 5. Heart: Regular rate and rhythm no  Murmur, Rub or gallop 6. Lungs:  Clear to auscultation bilaterally, no wheezes or crackles   7. Abdomen: Soft, periumbilical tenderness 2 inches from The umbilicus and at 66:29, patient is slight palpation but no peritoneal signs. No guarding abdomen is soft and normal bowel sounds. The patient tried to set up pain is exacerbated. Palpating musculature in the semierect position reproducing the pain. Active and passive leg raised does not.  Abdomen Non distended, no rash or redness at the site of the pain 8. Lower extremities: no clubbing, cyanosis, or edema 9. Neurologically Grossly intact, moving all 4 extremities equally  10. MSK: Normal range of motion   body mass index is unknown because there is no height or weight on file.  Labs on Admission:   Labs on Admission: I have  personally reviewed following labs and imaging studies  CBC: No results for input(s): WBC, NEUTROABS, HGB, HCT, MCV, PLT in the last 168 hours. Basic Metabolic Panel: No results for input(s): NA, K, CL, CO2, GLUCOSE, BUN, CREATININE, CALCIUM, MG, PHOS in the last 168 hours. GFR: CrCl cannot be calculated (Patient's most recent lab result is older than the maximum 21 days allowed.). Liver Function Tests: No results for input(s): AST, ALT, ALKPHOS, BILITOT, PROT, ALBUMIN in the last 168 hours. No results for input(s): LIPASE, AMYLASE in the last 168 hours. No results for input(s): AMMONIA in the last 168 hours. Coagulation Profile: No results for input(s): INR, PROTIME in the last 168 hours. Cardiac Enzymes: No results for input(s): CKTOTAL, CKMB, CKMBINDEX, TROPONINI in the last 168 hours. BNP (last 3 results) No results for input(s): PROBNP in the last 8760 hours. HbA1C: No results for input(s): HGBA1C in the last 72 hours. CBG: No results for input(s): GLUCAP in the last 168 hours. Lipid Profile: No results for input(s): CHOL, HDL, LDLCALC, TRIG, CHOLHDL, LDLDIRECT in the last 72 hours. Thyroid Function Tests: No results for input(s): TSH, T4TOTAL, FREET4, T3FREE, THYROIDAB in the last 72 hours. Anemia Panel: No results for input(s): VITAMINB12, FOLATE, FERRITIN, TIBC, IRON, RETICCTPCT in the last 72 hours. Urine analysis:  Sepsis Labs: @LABRCNTIP (procalcitonin:4,lacticidven:4) )No results found for this or any previous visit (from the past 240 hour(s)).     UA   no evidence of UTI     Lab Results  Component Value Date   HGBA1C 12.2 (H) 08/28/2013    CrCl cannot be calculated (Patient's most recent lab result is older than the maximum 21 days allowed.).  BNP (last 3 results) No results for input(s): PROBNP in the last 8760 hours.   ECG REPORT ordered  There were no vitals filed for this visit.   Cultures:    Component Value Date/Time   SDES URINE, CLEAN CATCH  08/28/2013 1156   SPECREQUEST Normal 08/28/2013 1156   CULT  08/28/2013 1156    Multiple bacterial morphotypes present, none predominant. Suggest appropriate recollection if clinically indicated. Performed at [facility] 08/29/2013 FINAL 08/28/2013 1156     Radiological Exams on Admission: No results found.  Chart has been reviewed    Assessment/Plan   56 y.o. female with medical history significant of  depression frequent UTIs esophageal spasm GERD history of hepatitis C hypercholesteremia, HTN type 2 diabetes peptic ulcer disease  Admitted for possible GI bleed Present on Admission: . Melena Versus Hemoccult positive stool in the setting of using ibuprofen with prior history of peptic ulcer disease Keep on clears as per GI continue Protonix order type and screen repeat CBC and follow, order anemia panel discussed her care with GI will see in a.m. . Essential hypertension - allow permissive hypertension for today monitor blood pressure . DM (diabetes mellitus), type 2 with neurological complications ([place]) order sliding scale patient is no longer on Lantus secondary to insurance problems. For better control while hospitalized or restart at 10 units, she will need diabetic education prior to discharge as patient is noncompliant  Elevated  lipase no epigastric pain repeated CT of abdomen nonacute. We'll repeat after rehydration.  Chest pain chronic - patient has had extensive evaluation for felt to be most likely GI related  Abdominal pain - this is long-standing status post 2 CAT scans I have reviewed them with radiologist personally. No area of inflammation around the site of pain no intra-abdominal findings at the site of pain or otherwise no evidence of renal stones.   Dysuria UA within normal limits no blood in urine despite patient reporting that she's been urinating blood   History of hepatitis C - deferred to GI regarding continuation of Harvoni   Other plan  as per orders.  DVT prophylaxis:  SCD     Code Status:  FULL CODE  e as per patient    Family Communication:   Family not  at  Bedside    Disposition Plan:      To home once workup is complete and patient is stable         Consults called: [facility]   Admission status:    inpatient    Level of care     tele           I have spent a total of 66 min on this admission  extra time was spent to discuss case with consultants  [name] [name] 08/13/2016, 1:08 AM    [facility]  Pager [phone]   after 2 AM please page floor coverage PA If 7AM-7PM, please contact the day team taking care of the patient  [email or web address]  Password [email or web address]

## 2016-08-13 ENCOUNTER — Encounter (HOSPITAL_COMMUNITY): Payer: Self-pay | Admitting: Internal Medicine

## 2016-08-13 DIAGNOSIS — R109 Unspecified abdominal pain: Secondary | ICD-10-CM | POA: Diagnosis present

## 2016-08-13 DIAGNOSIS — E1149 Type 2 diabetes mellitus with other diabetic neurological complication: Secondary | ICD-10-CM

## 2016-08-13 DIAGNOSIS — N179 Acute kidney failure, unspecified: Secondary | ICD-10-CM

## 2016-08-13 DIAGNOSIS — G8929 Other chronic pain: Secondary | ICD-10-CM

## 2016-08-13 DIAGNOSIS — K922 Gastrointestinal hemorrhage, unspecified: Secondary | ICD-10-CM | POA: Diagnosis present

## 2016-08-13 DIAGNOSIS — I10 Essential (primary) hypertension: Secondary | ICD-10-CM

## 2016-08-13 DIAGNOSIS — K2970 Gastritis, unspecified, without bleeding: Secondary | ICD-10-CM

## 2016-08-13 DIAGNOSIS — L84 Corns and callosities: Secondary | ICD-10-CM

## 2016-08-13 DIAGNOSIS — T39395A Adverse effect of other nonsteroidal anti-inflammatory drugs [NSAID], initial encounter: Secondary | ICD-10-CM

## 2016-08-13 DIAGNOSIS — K921 Melena: Secondary | ICD-10-CM

## 2016-08-13 DIAGNOSIS — R195 Other fecal abnormalities: Secondary | ICD-10-CM

## 2016-08-13 DIAGNOSIS — N183 Chronic kidney disease, stage 3 (moderate): Secondary | ICD-10-CM

## 2016-08-13 DIAGNOSIS — R1084 Generalized abdominal pain: Secondary | ICD-10-CM

## 2016-08-13 HISTORY — DX: Unspecified abdominal pain: R10.9

## 2016-08-13 HISTORY — DX: Gastritis, unspecified, without bleeding: K29.70

## 2016-08-13 HISTORY — DX: Corns and callosities: L84

## 2016-08-13 LAB — PHOSPHORUS: PHOSPHORUS: 3 mg/dL (ref 2.5–4.6)

## 2016-08-13 LAB — COMPREHENSIVE METABOLIC PANEL
ALBUMIN: 3 g/dL — AB (ref 3.5–5.0)
ALT: 16 U/L (ref 14–54)
ALT: 17 U/L (ref 14–54)
ANION GAP: 6 (ref 5–15)
AST: 16 U/L (ref 15–41)
AST: 17 U/L (ref 15–41)
Albumin: 2.9 g/dL — ABNORMAL LOW (ref 3.5–5.0)
Alkaline Phosphatase: 109 U/L (ref 38–126)
Alkaline Phosphatase: 101 U/L (ref 38–126)
Anion gap: 6 (ref 5–15)
BUN: 52 mg/dL — ABNORMAL HIGH (ref 6–20)
BUN: 48 mg/dL — ABNORMAL HIGH (ref 6–20)
CHLORIDE: 108 mmol/L (ref 101–111)
CO2: 19 mmol/L — AB (ref 22–32)
CO2: 19 mmol/L — ABNORMAL LOW (ref 22–32)
Calcium: 8.4 mg/dL — ABNORMAL LOW (ref 8.9–10.3)
Calcium: 8.2 mg/dL — ABNORMAL LOW (ref 8.9–10.3)
Chloride: 105 mmol/L (ref 101–111)
Creatinine, Ser: 1.83 mg/dL — ABNORMAL HIGH (ref 0.44–1.00)
Creatinine, Ser: 1.76 mg/dL — ABNORMAL HIGH (ref 0.44–1.00)
GFR calc Af Amer: 35 mL/min — ABNORMAL LOW (ref >60)
GFR calc non Af Amer: 31 mL/min — ABNORMAL LOW (ref >60)
GFR, EST AFRICAN AMERICAN: 36 mL/min — AB (ref >60)
GFR, EST NON AFRICAN AMERICAN: 30 mL/min — AB (ref >60)
GLUCOSE: 388 mg/dL — AB (ref 65–99)
Glucose, Bld: 302 mg/dL — ABNORMAL HIGH (ref 65–99)
POTASSIUM: 4 mmol/L (ref 3.5–5.1)
Potassium: 4.4 mmol/L (ref 3.5–5.1)
Sodium: 130 mmol/L — ABNORMAL LOW (ref 135–145)
Sodium: 133 mmol/L — ABNORMAL LOW (ref 135–145)
Total Bilirubin: 0.5 mg/dL (ref 0.3–1.2)
Total Bilirubin: 0.6 mg/dL (ref 0.3–1.2)
Total Protein: 5.6 g/dL — ABNORMAL LOW (ref 6.5–8.1)
Total Protein: 5.5 g/dL — ABNORMAL LOW (ref 6.5–8.1)

## 2016-08-13 LAB — IRON AND TIBC
IRON: 113 ug/dL (ref 28–170)
Saturation Ratios: 45 % — ABNORMAL HIGH (ref 10.4–31.8)
TIBC: 249 ug/dL — AB (ref 250–450)
UIBC: 136 ug/dL

## 2016-08-13 LAB — GLUCOSE, CAPILLARY
GLUCOSE-CAPILLARY: 181 mg/dL — AB (ref 65–99)
GLUCOSE-CAPILLARY: 216 mg/dL — AB (ref 65–99)
GLUCOSE-CAPILLARY: 195 mg/dL — AB (ref 65–99)
Glucose-Capillary: 351 mg/dL — ABNORMAL HIGH (ref 65–99)
Glucose-Capillary: 282 mg/dL — ABNORMAL HIGH (ref 65–99)
Glucose-Capillary: 261 mg/dL — ABNORMAL HIGH (ref 65–99)
Glucose-Capillary: 307 mg/dL — ABNORMAL HIGH (ref 65–99)

## 2016-08-13 LAB — MAGNESIUM: MAGNESIUM: 2 mg/dL (ref 1.7–2.4)

## 2016-08-13 LAB — RETICULOCYTES
RBC.: 3 MIL/uL — ABNORMAL LOW (ref 3.87–5.11)
Retic Count, Absolute: 84 10*3/uL (ref 19.0–186.0)
Retic Ct Pct: 2.8 % (ref 0.4–3.1)

## 2016-08-13 LAB — CBC
HCT: 23.1 % — ABNORMAL LOW (ref 36.0–46.0)
HEMATOCRIT: 22.1 % — AB (ref 36.0–46.0)
HEMOGLOBIN: 7.7 g/dL — AB (ref 12.0–15.0)
Hemoglobin: 8.1 g/dL — ABNORMAL LOW (ref 12.0–15.0)
MCH: 27 pg (ref 26.0–34.0)
MCH: 27.1 pg (ref 26.0–34.0)
MCHC: 35.1 g/dL (ref 30.0–36.0)
MCHC: 34.8 g/dL (ref 30.0–36.0)
MCV: 77.3 fL — AB (ref 78.0–100.0)
MCV: 77.5 fL — ABNORMAL LOW (ref 78.0–100.0)
PLATELETS: 222 10*3/uL (ref 150–400)
Platelets: 205 10*3/uL (ref 150–400)
RBC: 2.85 MIL/uL — AB (ref 3.87–5.11)
RBC: 2.99 MIL/uL — ABNORMAL LOW (ref 3.87–5.11)
RDW: 12.7 % (ref 11.5–15.5)
RDW: 12.9 % (ref 11.5–15.5)
WBC: 10.5 10*3/uL (ref 4.0–10.5)
WBC: 11 10*3/uL — AB (ref 4.0–10.5)

## 2016-08-13 LAB — URINALYSIS, MICROSCOPIC (REFLEX)

## 2016-08-13 LAB — CK: Total CK: 245 U/L — ABNORMAL HIGH (ref 38–234)

## 2016-08-13 LAB — URINALYSIS, ROUTINE W REFLEX MICROSCOPIC
BILIRUBIN URINE: NEGATIVE
Glucose, UA: NEGATIVE mg/dL
KETONES UR: NEGATIVE mg/dL
NITRITE: NEGATIVE
PH: 5.5 (ref 5.0–8.0)
Protein, ur: 100 mg/dL — AB
SPECIFIC GRAVITY, URINE: 1.02 (ref 1.005–1.030)

## 2016-08-13 LAB — LIPASE, BLOOD
LIPASE: 46 U/L (ref 11–51)
Lipase: 63 U/L — ABNORMAL HIGH (ref 11–51)

## 2016-08-13 LAB — PROTIME-INR
INR: 1.09
PROTHROMBIN TIME: 14.2 s (ref 11.4–15.2)

## 2016-08-13 LAB — FOLATE: FOLATE: 8.1 ng/mL (ref >5.9)

## 2016-08-13 LAB — SEDIMENTATION RATE: Sed Rate: 17 mm/hr (ref 0–22)

## 2016-08-13 LAB — TROPONIN I: Troponin I: <0.03 ng/mL (ref <0.03)

## 2016-08-13 LAB — TSH: TSH: 1.488 u[IU]/mL (ref 0.350–4.500)

## 2016-08-13 LAB — ABO/RH: ABO/RH(D): O POS

## 2016-08-13 LAB — HIV ANTIBODY (ROUTINE TESTING W REFLEX): HIV Screen 4th Generation wRfx: NONREACTIVE

## 2016-08-13 LAB — FERRITIN: FERRITIN: 86 ng/mL (ref 11–307)

## 2016-08-13 LAB — VITAMIN B12: VITAMIN B 12: 465 pg/mL (ref 180–914)

## 2016-08-13 MED ORDER — TRAMADOL HCL 50 MG PO TABS
50.0000 mg | ORAL_TABLET | Freq: Four times a day (QID) | ORAL | Status: DC | PRN
  Administered 2016-08-13 – 2016-08-14 (×4): 50 mg via ORAL
  Filled 2016-08-13 (×4): qty 1

## 2016-08-13 MED ORDER — ACETAMINOPHEN 650 MG RE SUPP: 650.0000 mg | Freq: Four times a day (QID) | RECTAL | Status: DC | PRN

## 2016-08-13 MED ORDER — INSULIN GLARGINE 100 UNIT/ML ~~LOC~~ SOLN
10.0000 [IU] | Freq: Every day | SUBCUTANEOUS | Status: DC
  Administered 2016-08-13 – 2016-08-14 (×3): 10 [IU] via SUBCUTANEOUS
  Filled 2016-08-13 (×4): qty 0.1

## 2016-08-13 MED ORDER — ONDANSETRON HCL 4 MG/2ML IJ SOLN: 4.0000 mg | Freq: Four times a day (QID) | INTRAMUSCULAR | Status: DC | PRN

## 2016-08-13 MED ORDER — ONDANSETRON HCL 4 MG PO TABS: 4.0000 mg | ORAL_TABLET | Freq: Four times a day (QID) | ORAL | Status: DC | PRN

## 2016-08-13 MED ORDER — SERTRALINE HCL 25 MG PO TABS
25.0000 mg | ORAL_TABLET | Freq: Every morning | ORAL | Status: DC
  Administered 2016-08-13 – 2016-08-15 (×3): 25 mg via ORAL
  Filled 2016-08-13 (×3): qty 1

## 2016-08-13 MED ORDER — SODIUM CHLORIDE 0.9 % IV SOLN
INTRAVENOUS | Status: DC
  Administered 2016-08-13: 01:00:00 via INTRAVENOUS

## 2016-08-13 MED ORDER — PANTOPRAZOLE SODIUM 40 MG IV SOLR
40.0000 mg | Freq: Two times a day (BID) | INTRAVENOUS | Status: DC
  Administered 2016-08-13 – 2016-08-14 (×5): 40 mg via INTRAVENOUS
  Filled 2016-08-13 (×5): qty 40

## 2016-08-13 MED ORDER — ACETAMINOPHEN 325 MG PO TABS: 650.0000 mg | ORAL_TABLET | Freq: Four times a day (QID) | ORAL | Status: DC | PRN

## 2016-08-13 MED ORDER — SODIUM CHLORIDE 0.9% FLUSH
3.0000 mL | Freq: Two times a day (BID) | INTRAVENOUS | Status: DC
  Administered 2016-08-13 – 2016-08-15 (×6): 3 mL via INTRAVENOUS

## 2016-08-13 MED ORDER — OXYCODONE HCL 5 MG PO TABS
5.0000 mg | ORAL_TABLET | Freq: Four times a day (QID) | ORAL | Status: DC | PRN
  Administered 2016-08-13 – 2016-08-14 (×4): 5 mg via ORAL
  Filled 2016-08-13 (×4): qty 1

## 2016-08-13 MED ORDER — LEDIPASVIR-SOFOSBUVIR 90-400 MG PO TABS: 1.0000 | ORAL_TABLET | Freq: Every day | ORAL | Status: DC

## 2016-08-13 NOTE — Progress Notes (Signed)
Physical Therapy Treatment Patient Details Name: [name] [name] MRN: [id number] DOB: 1960-12-26 Today's Date: 08/13/2016    History of Present Illness pt is a 56 y/o femal with pmh significant for UTI's esophageal spasm, Hep C on Harvoni, DM2, and PUD, admitted with ~59month h/o abdominal/perumbilical pain radiating to the back.  Endoscopy pending.    PT Comments    Pt admitted with/for periumbilical pain radiating to the back.  Pt needing min guard to Supervision for mobility and gait.  Pt currently limited functionally due to the problems listed below.  (see problems list.)  Pt will benefit from PT to maximize function and safety to be able to get home safely with available assist of family.    Follow Up Recommendations  Supervision - Intermittent     Equipment Recommendations       Recommendations for Other Services       Precautions / Restrictions Precautions Precautions: Fall Restrictions Weight Bearing Restrictions: No    Mobility  Bed Mobility Overal bed mobility: Needs Assistance Bed Mobility: Supine to Sit;Sit to Supine     Supine to sit: Supervision Sit to supine: Supervision   General bed mobility comments: increased time and effort when not allowing pt to pull on anything  Transfers Overall transfer level: Needs assistance Equipment used: None Transfers: Sit to/from Stand Sit to Stand: Supervision            Ambulation/Gait Ambulation/Gait assistance: Min guard Ambulation Distance (Feet): 200 Feet Assistive device: None Gait Pattern/deviations: Step-through pattern   Gait velocity interpretation: Below normal speed for age/gender General Gait Details: generally steady until asked to turn suddenly, back up or scan and then becomes mildly unsteady.  On executing a back up then turn to reverse course, pt lost balance needing mod assist to recover.   Stairs            Wheelchair Mobility    Modified Rankin (Stroke Patients Only)        Balance Overall balance assessment: Needs assistance   Sitting balance-Leahy Scale: Fair       Standing balance-Leahy Scale: Fair                              Cognition Arousal/Alertness: Awake/alert Behavior During Therapy: WFL for tasks assessed/performed Overall Cognitive Status: Within Functional Limits for tasks assessed                                        Exercises      General Comments        Pertinent Vitals/Pain Pain Assessment: Faces Faces Pain Scale: Hurts even more Pain Location: abdomen Pain Descriptors / Indicators: Guarding;Grimacing;Sharp Pain Intervention(s): Monitored during session    Home Living Family/patient expects to be discharged to:: Private residence Living Arrangements: Spouse/significant other;Other relatives Available Help at Discharge: Family;Other (Comment) (up to 24/7) Type of Home: House Home Access: Stairs to enter Entrance Stairs-Rails: Right;Left Home Layout: Other (Comment) (sunk in rooms down 2 steps.) Home Equipment: Walker - 2 wheels      Prior Function Level of Independence: Independent          PT Goals (current goals can now be found in the care plan section) Acute Rehab PT Goals Patient Stated Goal: figure out what's the problem causing my pain. PT Goal Formulation: With patient Time For Goal Achievement: 08/27/16  Potential to Achieve Goals: Good    Frequency    Min 3X/week      PT Plan      Co-evaluation              AM-PAC PT "6 Clicks" Daily Activity  Outcome Measure  Difficulty turning over in bed (including adjusting bedclothes, sheets and blankets)?: None Difficulty moving from lying on back to sitting on the side of the bed? : A Little Difficulty sitting down on and standing up from a chair with arms (e.g., wheelchair, bedside commode, etc,.)?: A Little Help needed moving to and from a bed to chair (including a wheelchair)?: A Little Help needed walking in  hospital room?: A Little Help needed climbing 3-5 steps with a railing? : A Little 6 Click Score: 19    End of Session   Activity Tolerance: Patient tolerated treatment well;Patient limited by fatigue Patient left: with call bell/phone within reach;in bed;with family/visitor present Nurse Communication: Mobility status PT Visit Diagnosis: Unsteadiness on feet (R26.81);Pain Pain - part of body:  (abdomina)     Time: 3709-6438 PT Time Calculation (min) (ACUTE ONLY): 26 min  Charges:  $Gait Training: 8-22 mins                    G Codes:       2016-09-01  Donnella Sham, [place] [phone]  (pager)   Tessie Fass Yaniyah Koors 2016-09-01, 4:41 PM

## 2016-08-13 NOTE — Anesthesia Preprocedure Evaluation (Addendum)
Anesthesia Evaluation  Patient identified by MRN, date of birth, ID band Patient awake    Reviewed: Allergy & Precautions, H&P , NPO status , Patient's Chart, lab work & pertinent test results  History of Anesthesia Complications Negative for: history of anesthetic complications  Airway Mallampati: II  TM Distance: >3 FB Neck ROM: Full    Dental no notable dental hx. (+) Dental Advisory Given   Pulmonary pneumonia, resolved,    Pulmonary exam normal breath sounds clear to auscultation       Cardiovascular hypertension, Pt. on home beta blockers Normal cardiovascular exam  TTE 7/15: EF 65-70 w/ no valvular abnormalities   Neuro/Psych  Headaches, PSYCHIATRIC DISORDERS Anxiety Depression  Neuromuscular disease    GI/Hepatic PUD, GERD  Medicated and Controlled,(+) Hepatitis -, C  Endo/Other  diabetes, Poorly Controlled, Type 2, Oral Hypoglycemic AgentsMorbid obesity  Renal/GU negative Renal ROS  negative genitourinary   Musculoskeletal  (+) Arthritis , Osteoarthritis,    Abdominal (+)  Abdomen: soft.    Peds negative pediatric ROS (+)  Hematology negative hematology ROS (+)   Anesthesia Other Findings   Reproductive/Obstetrics negative OB ROS                            Anesthesia Physical  Anesthesia Plan  ASA: III  Anesthesia Plan: MAC   Post-op Pain Management:    Induction: Intravenous  PONV Risk Score and Plan: 2 and Ondansetron and Dexamethasone  Airway Management Planned: Nasal Cannula  Additional Equipment:   Intra-op Plan:   Post-operative Plan:   Informed Consent: I have reviewed the patients History and Physical, chart, labs and discussed the procedure including the risks, benefits and alternatives for the proposed anesthesia with the patient or authorized representative who has indicated his/her understanding and acceptance.   Dental advisory given  Plan  Discussed with: Anesthesiologist and CRNA  Anesthesia Plan Comments:        Anesthesia Quick Evaluation

## 2016-08-13 NOTE — Consult Note (Signed)
[facility] Consult: 8:23 AM 08/13/2016  LOS: 1 day    Referring Provider: Dr Ree Kida  Primary Care Physician:  [name], [facility] in [place].   Primary Gastroenterologist:  Dr Loletha Carrow.  Roosevelt Locks, NP for [facility] liver clinic in [place]       Reason for Consultation:       HPI: [name] [name] is a 56 y.o. female.  PMH obesity.  Recurrent UTIs.  HTN. Depression, anxiety.  Peripheral neuropathy.  DM 2, insulin requiring.  Atypical chest pain.  GERD.  Gastric ulcers. Dysphagia.   Esophageal spasm.  S/p multiple abdomino/pelvic surgeries including cholecystectomy, appendectomy, total abdominal hysterectomy/BSO.  Hep C.   As of 06/11/16 visit to NP [place], was to initiate Harvoni (12 week course planned with possible termination at 8 weeks).  Hep A/B vaccinations initiated 03/2016.  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  During course of Harvoni, pt was to stop using Omeprozole.    Long standing intermittent severe chest pain, dysphagia and choking with PO.  Sxs attributed to esophageal spasm/dyskinesia however prior manometries unremarkable.  Nonethelesss Dr Deatra Ina had performed multiple EGDs and esophageal dilatations and Botox injection date back to at least 2005 which have provided temporary relief.   Has been seen by Dr Derrill Kay at [facility] for this issue.  According to his note in 2013: "we have established a diagnosis of dumping syndrome, small bowel bacterial overgrowth and IBS. She has only had minimal response to treatment of her bacterial overgrowth".  Dr Derrill Kay treated + H Pylori in 2013.  After normal repeat manometry study in 12/2015, Dr Loletha Carrow saw : "no role for Botox injection like she has had in the past".   01/2013 Colonoscopy: normal avg risk screening study: Normal.  fup study 01/2023.     05/2011 EGD.  H Pylori + gastritis. (treated).   12/2012 EGD. Early esoph stricture.  Gastric erosions.  Balloon dilation and Botox injection to GE Jx.  06/2013 EGD.  Chest pain secondary to esoph spasm.  Grossly normal EGD.  S/p balloon dilation and Botox injection.  10/2013 EGD Grossly normal.  Underwent 58m balloon dilation and Botox injection 12/2009 esophageal manometry. Normal.    06/2013 Esophageal manometry. 12/26/2015 Esophageal manometry:  Normal study.   Bravo pH study (at least 2 times, latest in 2013 off PPI): normal level reflux.  2006, 2011, 2015 esophagrams: normal with minor GER.  Minor vomiting triggered by upper esophageal stasis in 2015  Pt had pain in right to mid upper abdomen starting mid to later 05/2016, present prior to stopping Omeprazole and starting Harvoni.  Initially had n/v but these subsided.  [facility] without contrast [facility] due to CKD[facility] both unremarkable.  Food or activity do not trigger pain, it is constant.  Pain worse with urination.  Baclofen allows her to sleep but she wakens with same pain.  Taking up to 8 Ibuprofen (with additional Motrin at times), up to 4 Aleve and up to 4 ASA 325 daily for pain.  Stools unaffected, continues brown, 2 to 3 times weekly.  No weight loss.  Pain is different than the esophageal, chest pain, dysphagia she has had historically.  Medication changes in recent weeks include stopping of [place] provide and lisinopril. She apparently has had problems with elevated potassium levels. She does report itching but no eruptions since starting Harvoni and was advised as long as she could tolerate the itching, to keep taking the [place].   Over this past few days, became dizzy/pre-syncopal with standing up.   Because of the increased dizziness yesterday she presented to [facility]. Hemoglobin there was 9, this had dropped from 11.7 in  ~ 7 days.  Stool was melenic appearing and FOBT positive. Blood  glucose was 572. Lipase was 716. Total bilirubin 0.4. Alkaline phosphatase 166. AST/ALT 20/27.  BUN/creatinine 56/1.8.  Hgb 8.1 >> 7.7, (12 in 11/2015).  MCV 77.  Platelets, coags normal. Progression to CKD stage 3 vs AKI (GFR 52 in 11/2015,  36 now).  Total CK elevated. Sugars in 300s.  On recheck her lipase 63, alk phos 109.   Pt denies bloody and tarry stool, however reported as tarry by MD DRE at [facility].     Past Medical History:  Diagnosis Date  . Anxiety   . Arthritis    "hips" (08/28/2013)  . Chronic lower back pain    10-27-13 not a problem at this time  . Depression   . Esophageal spasm   . Frequent UTI   . GERD (gastroesophageal reflux disease)   . Hepatitis C   . High cholesterol   . Hypertension   . Migraine    "monthly" (08/28/2013)  . Pneumonia    "several times"  . Type II diabetes mellitus ([place])   . Ulcer    hx gastric ulcers    Past Surgical History:  Procedure Laterality Date  . ABDOMINAL HYSTERECTOMY    . APPENDECTOMY    . BALLOON DILATION N/A 12/29/2012   Procedure: BALLOON DILATION;  Surgeon: Inda Castle, MD;  Location: [facility];  Service: Endoscopy;  Laterality: N/A;  . BALLOON DILATION N/A 06/25/2013   Procedure: BALLOON DILATION;  Surgeon: Inda Castle, MD;  Location: [facility];  Service: Endoscopy;  Laterality: N/A;  . BOTOX INJECTION  12/29/2012   Procedure: BOTOX INJECTION;  Surgeon: Inda Castle, MD;  Location: [facility];  Service: Endoscopy;;  . BOTOX INJECTION N/A 06/25/2013   Procedure: BOTOX INJECTION;  Surgeon: Inda Castle, MD;  Location: [facility];  Service: Endoscopy;  Laterality: N/A;  . CARDIAC CATHETERIZATION  X 2   /notes 08/28/2013  . CARPAL TUNNEL RELEASE Right   . CHOLECYSTECTOMY    . COLONOSCOPY  04/10/2012   Normal   . ESOPHAGEAL MANOMETRY N/A 12/26/2015   Procedure: ESOPHAGEAL MANOMETRY (EM);  Surgeon: Doran Stabler, MD;  Location: [facility];  Service: Gastroenterology;  Laterality:  N/A;  . ESOPHAGOGASTRODUODENOSCOPY N/A 12/29/2012   Procedure: ESOPHAGOGASTRODUODENOSCOPY (EGD);  Surgeon: Inda Castle, MD;  Location: [facility];  Service: Endoscopy;  Laterality: N/A;  . ESOPHAGOGASTRODUODENOSCOPY N/A 06/25/2013   Procedure: ESOPHAGOGASTRODUODENOSCOPY (EGD);  Surgeon: Inda Castle, MD;  Location: [facility];  Service: Endoscopy;  Laterality: N/A;  . ESOPHAGOGASTRODUODENOSCOPY N/A 11/03/2013   Procedure: ESOPHAGOGASTRODUODENOSCOPY (EGD);  Surgeon: Inda Castle, MD;  Location: [facility];  Service: Endoscopy;  Laterality: N/A;  Botox - ballon dilation  . FOOT FRACTURE SURGERY Right   . HEEL SPUR EXCISION Right   . TONSILLECTOMY    .  TUBAL LIGATION      Prior to Admission medications   Medication Sig Start Date End Date Taking? Authorizing Provider  Ledipasvir-Sofosbuvir (HARVONI PO) Take by mouth.   Yes [provider]  acetaminophen (TYLENOL) 500 MG tablet Take 1,000 mg by mouth 2 (two) times daily.    [provider]  cloNIDine (CATAPRES) 0.1 MG tablet Take 0.1 mg by mouth 2 (two) times daily.    [provider]  dicyclomine (BENTYL) 20 MG tablet Take 20 mg by mouth every 6 (six) hours.    [provider]  insulin lispro (HUMALOG) 100 UNIT/ML injection Inject 20 Units into the skin 2 (two) times daily.     [provider]  labetalol (NORMODYNE) 200 MG tablet Take 300 mg by mouth 2 (two) times daily.    [provider]  nitroGLYCERIN (NITRODUR - DOSED IN MG/24 HR) 0.2 mg/hr patch Place 0.2 mg onto the skin daily as needed. Apply when having chest pain    [provider]  nitroGLYCERIN (NITROSTAT) 0.4 MG SL tablet Place 0.4 mg under the tongue every 5 (five) minutes as needed for chest pain.    [provider]  sertraline (ZOLOFT) 25 MG tablet Take 25 mg by mouth every morning.     [provider]    Scheduled Meds: . insulin aspart  0-9 Units Subcutaneous Q4H  . insulin  glargine  10 Units Subcutaneous QHS  . pantoprazole (PROTONIX) IV  40 mg Intravenous Q12H  . sertraline  25 mg Oral q morning - 10a  . sodium chloride flush  3 mL Intravenous Q12H   Infusions: . sodium chloride 100 mL/hr at 08/13/16 0122   PRN Meds: acetaminophen **OR** acetaminophen, ondansetron **OR** ondansetron (ZOFRAN) IV, traMADol   Allergies as of 08/12/2016 - Review Complete 12/26/2015  Allergen Reaction Noted  . Codeine Itching 03/10/2008  . Dilaudid [hydromorphone hcl] Itching 08/28/2013    Family History  Problem Relation Age of Onset  . Hypertension Mother   . Alzheimer's disease Father   . Throat cancer Sister 33       died at age 105  . Colon cancer Neg Hx   . Rectal cancer Neg Hx   . Stomach cancer Neg Hx     Social History   Social History  . Marital status: Married    Spouse name: Lanny Hurst   . Number of children: 2  . Years of education: N/A   Occupational History  . unempoyed    Social History Main Topics  . Smoking status: Never Smoker  . Smokeless tobacco: Never Used  . Alcohol use No  . Drug use: No  . Sexual activity: Yes    Partners: Male   Other Topics Concern  . Not on file   Social History Narrative  . No narrative on file    REVIEW OF SYSTEMS: Constitutional:  Weakness, dizziness.  Generally little exercise ENT:  No nose bleeds Pulm:  No SOB or cough.   CV:  No palpitations, no LE edema.  GU:  No hematuria, no frequency GI:  See HPI Heme:  No unusual bleeding or bruising. No previous issues with anemia or needing iron supplements.   Transfusions:  Does not recall any recent transfusions. Neuro:  No headaches, no peripheral tingling or numbness Derm:  No itching, no rash or sores.  Endocrine:  No sweats or chills.  No polyuria or dysuria Immunization:  None that she recalls in recent years. Travel:  None beyond local counties  in last few months.    PHYSICAL EXAM: Vital signs in last 24 hours: Vitals:   08/12/16 2300  08/13/16 0451  BP: (!) 131/52 139/71  Pulse: 69 63  Resp: 20 20  Temp: 98.2 F (36.8 C) 98.1 F (36.7 C)   Wt Readings from Last 3 Encounters:  08/12/16 110.5 kg (243 lb 8 oz)  12/13/15 112 kg (247 lb)  03/01/14 110.7 kg (244 lb)    General: Obese, non-ill appearing somewhat anxious AAF who appears younger than stated age. Head:  No asymmetry or facial edema. No signs of head trauma.  Eyes:  No scleral icterus, no conjunctival pallor. Ears:  Not hard of hearing  Nose:  No discharge Mouth:  Oral mucosa is pink, moist, clear. Tongue midline. Neck:  No JVD, no masses, no thyromegaly. Lungs:  Excellent breath sounds which are clear. No cough. No dyspnea. Heart: RRR. No MRG. S1, S2 present. Abdomen:  Obese, soft. Hypoactive bowel sounds but no tinkling or high-pitched sounds. Tender in the upper abdomen about midway from the umbilicus to the xiphoid process. This is focal but there is no guarding or rebound.  No masses, no HSM. No bruits. No hernias. Rectal: Deferred rectal exam.   Musc/Skeltl: no joiont redness or swelling Extremities:  No CCE  Neurologic:  Oriented x 3.  No tremor or weakness Skin:  No rashes or sores Tattoos:  None seen Nodes:  No cervical adenopathy.     Psych:  Mildly anxious. Cooperative.   Intake/Output from previous day: 07/01 0701 - 07/02 0700 In: 516.7 [I.V.:516.7] Out: 600 [Urine:600] Intake/Output this shift: Total I/O In: 0  Out: 300 [Urine:300]  LAB RESULTS:  Recent Labs  08/13/16 0044 08/13/16 0455  WBC 10.5 11.0*  HGB 8.1* 7.7*  HCT 23.1* 22.1*  PLT 222 205   BMET Lab Results  Component Value Date   NA 133 (L) 08/13/2016   NA 130 (L) 08/13/2016   NA 132 (L) 10/22/2013   K 4.0 08/13/2016   K 4.4 08/13/2016   K 4.4 10/22/2013   CL 108 08/13/2016   CL 105 08/13/2016   CL 93 (L) 10/22/2013   CO2 19 (L) 08/13/2016   CO2 19 (L) 08/13/2016   CO2 23 10/22/2013   GLUCOSE 302 (H) 08/13/2016   GLUCOSE 388 (H) 08/13/2016   GLUCOSE  401 (H) 10/22/2013   BUN 48 (H) 08/13/2016   BUN 52 (H) 08/13/2016   BUN 33 (H) 10/22/2013   CREATININE 1.76 (H) 08/13/2016   CREATININE 1.83 (H) 08/13/2016   CREATININE 1.00 10/22/2013   CALCIUM 8.2 (L) 08/13/2016   CALCIUM 8.4 (L) 08/13/2016   CALCIUM 9.8 10/22/2013   LFT  Recent Labs  08/13/16 0044 08/13/16 0455  PROT 5.6* 5.5*  ALBUMIN 3.0* 2.9*  AST 17 16  ALT 17 16  ALKPHOS 109 101  BILITOT 0.5 0.6   PT/INR Lab Results  Component Value Date   INR 1.09 08/13/2016   INR 0.99 10/22/2013   Hepatitis Panel No results for input(s): HEPBSAG, HCVAB, HEPAIGM, HEPBIGM in the last 72 hours. C-Diff No components found for: CDIFF Lipase     Component Value Date/Time   LIPASE 63 (H) 08/13/2016 0044    Drugs of Abuse  No results found for: LABOPIA, COCAINSCRNUR, LABBENZ, AMPHETMU, THCU, LABBARB   RADIOLOGY STUDIES: No results found.    IMPRESSION:   *  Upper abdominal pain for about a month. Lipase is elevated, suspect pancreatitis vs PUD in region of pancreas.  s/p cholecystectomy. No biliary or pancreatic abnormalities on 2 separate, noncontrast, CT abdomen pelvis is within the last 4 weeks.  No ETOH  *  Melenic stool and acute, blood loss, anemia in setting of excessive use of aspirin and nonsteroidal medications. Rule out NSAID ulcers.  No transfusions to date.   *  Poorly controlled type 2 IDDM  *  Hx dysphagia.  Off PPI for 1 month per advisement of liver clinic.      PLAN:     *  EGD tomorrow.  Hold off on any repeat abdominal imaging for now.  Continue q 12 hour PPI.     Azucena Freed  08/13/2016, 8:23 AM Pager: [phone]  ________________________________________________________________________  Velora Heckler GI MD note:  I personally examined the patient, reviewed the data and agree with the assessment and plan described above.  SHe is taking an extraordinary amount of NSAIDs, ASA daily for several weeks. I think it is quite likely that she has NSAID  related GI bleeding, pain.  Planning on EGD tomorrow.  Her lipase was quite elevated and then completely back to normal the next day, given CT scans (2 in the past month) I doubt significant pancreatic process or current acute pancreatitis.     Owens Loffler, MD [facility] Pager [phone]

## 2016-08-13 NOTE — Progress Notes (Signed)
Pt taking Harvoni, a medication not stocked in the hospital. Pt stated she has to have medication daily. MD called and ordered for pt to take medication from home supply. Pt took medication tonight and it will be taken to pharmacy and dispensed from them.

## 2016-08-13 NOTE — Progress Notes (Signed)
PROGRESS NOTE    [name] [name]  FTD:[id number] DOB: 03-Oct-1960 DOA: 08/12/2016 PCP: [name], MD   No chief complaint on file.   Brief Narrative:  HPI On 08/12/2016 by Dr. Phillips Hay [name] [name] is a 56 y.o. female with medical history significant of  depression frequent UTIs esophageal spasm GERD history of hepatitis C hypercholesteremia, HTN type 2 diabetes peptic ulcer disease Presented with almost 2 month hx  of abdominal pain periumbelical  radiating to the back sharp and stabbing, started 2 weeks before she started on Harvoni. Reports abdominal pain radiating to her right leg worse with sitting up.  Patient took Tylenol and IBuprofen. She have had some dysuria and possible blood in urine. Urgent care gave her AZO with no improvement. Did not help the pain. Reports nausea when she is eating. Feels light headed worse with standing up. Reports she was evaluated for the same Last week at [facility] they had a CT done and it was nonacute. She had CT abdomen ordered by her PCP 2 weeks ago states with out contrast also non acute. She does not look at her stools but states at [facility] she was found to have melena and was positive for blood.   Her hemoglobin went from 11.7 to 9 over the past 1 week.  She was evaluated and an Oval [place]sey LFTs found to be within normal limits but lipase elevated 716 she was given 80 mg of Protonix and sent to [facility] for evaluation of possible Gi bleed. She has been seen by Dr. Deatra Ina with GI in the past for esophageal botox injections she tried to go back but was told that she will not be needing those injections anymore. She has history of gastric ulcers as per EGD 2014  Assessment & Plan   Abdominal pain -Has been a chronic problem, see discussion below. Patient has had 2 CT scans which were reviewed, no intra-abdominal findings to explain patient's pain. -Possibly secondary to chronic pain versus hepatitis vs possible UTI -Lipase was elevated at 63, will  continue to monitor. Doubt this is pancreatitis, no evidence on CT scan -No recent alcohol use  GI bleeding/Possibly upper GI bleed/Sympotmatic anemia/Acute on chronic anemia -Patient has been using NSAIDs to relieve her pain -patient complained of weakness and the feeling of passing out over the past several days -hemoglobin currently 7.7 (baseline around 11-12 per review of CareEverywhere) -Possibly has ulcer related to NSAID use -Continue PPI, currenlty on clear liquid diet -Gastroenterology consulted and appreciated, plan for EGD on 08/14/2016 -Anemia panel: iron 113, Ferritin 86, folate 8.1, B12 465 -Continue to monitor CBC  Acute kidney injury on Chronic kidney disease, stage III -Baseline creatinine around 1.3, however GFR stage III (reviewed CareEverywhere) -Currently creatinine 1.76 (was 1.83 on admission) -Suspect seconadary to NSAID use and blood loss -Will continue to monitor BMP  Essential hypertension -BP meds due to possibly GI Bleed and the risk of hypotension -Will continue to monitor and add on medications as needed -Per med rec: home meds labetalol, clonidine  Diabetes mellitus, type II -Continue lantus and ISS with CBG monitoring   Dysuria -Patient complains of abdominal pain and painful urination -Will obtain UA  History of hepatitis C -patient will need to continue outpatient follow up, [place], NP with [employer] [facility], [facility]  Depression -Continue zoloft  Chronic pain -Per review of patient's chart in CareEverywhere: -Patient has had chronic chest pain which she believes is secondary to her esophagus, and has  had Botox injections in her esophagus before by gastroenterology specialist. She also has complaints of leg cramps which she has been getting baclofen and Neurontin 4 without resolution of her symptoms. Patient has been following up with her primary care physician for these things. -Patient is also followed up  with cardiology at [facility]: She has had 10 years of intermittent chest pain. Her blood pressure medications have been changed. Per cardiology note from 12/2015, chest pain was noncardiac in nature, suspect esophageal versus possibly hypertension related. Hypertensive medication changed. -Per patient, she has been using lots of over-the-counter NSAIDs she was not getting any pain medication.  DVT Prophylaxis  SCDs  Code Status: Full  Family Communication: None at bedside  Disposition Plan: Admitted. Pending EGD on 08/14/2016  Consultants Gastroenterology  Procedures  None  Antibiotics   Anti-infectives    None      Subjective:   [name] [name] seen and examined today.  Patient continues to complain abdominal pain, lower. Feels it worsens when she urinates. Complains of weakness and dizziness. Felt she was going to pass out over the past several days, however did not. She feels her pain is so uncontrolled. Currently denies nausea, vomiting, diarrhea, constipation. Admits to seeing streaks of bright red blood on her toilet paper. Currently denies chest pain, shortness of breath, cough, headache.   Objective:   Vitals:   08/12/16 2300 08/13/16 0451  BP: (!) 131/52 139/71  Pulse: 69 63  Resp: 20 20  Temp: 98.2 F (36.8 C) 98.1 F (36.7 C)  TempSrc: Oral   SpO2: 100% 99%  Weight: 110.5 kg (243 lb 8 oz)   Height: 5\' 5"  (1.651 m)     Intake/Output Summary (Last 24 hours) at 08/13/16 1048 Last data filed at 08/13/16 1043  Gross per 24 hour  Intake           516.67 ml  Output             1200 ml  Net          -683.33 ml   Filed Weights   08/12/16 2300  Weight: 110.5 kg (243 lb 8 oz)    Exam  General: Well developed, well nourished, NAD, appears stated age  HEENT: NCAT, PERRLA, EOMI, Anicteic Sclera, mucous membranes moist.   Neck: Supple, no JVD, no masses  Cardiovascular: S1 S2 auscultated, no rubs, murmurs or gallops. Regular rate and rhythm.  Respiratory:  Clear to auscultation bilaterally with equal chest rise  Abdomen: Soft, nontender, nondistended, + bowel sounds  Extremities: warm dry without cyanosis clubbing or edema  Neuro: AAOx3, cranial nerves grossly intact. Strength 5/5 in patient's upper and lower extremities bilaterally  Skin: Without rashes exudates or nodules  Psych: Normal affect and demeanor with intact judgement and insight   Data Reviewed: I have personally reviewed following labs and imaging studies  CBC:  Recent Labs Lab 08/13/16 0044 08/13/16 0455  WBC 10.5 11.0*  HGB 8.1* 7.7*  HCT 23.1* 22.1*  MCV 77.3* 77.5*  PLT 222 322   Basic Metabolic Panel:  Recent Labs Lab 08/13/16 0044 08/13/16 0455  NA 130* 133*  K 4.4 4.0  CL 105 108  CO2 19* 19*  GLUCOSE 388* 302*  BUN 52* 48*  CREATININE 1.83* 1.76*  CALCIUM 8.4* 8.2*  MG  --  2.0  PHOS  --  3.0   GFR: Estimated Creatinine Clearance: 44.7 mL/min (A) (by C-G formula based on SCr of 1.76 mg/dL (H)). Liver Function Tests:  Recent Labs Lab 08/13/16 0044 08/13/16 0455  AST 17 16  ALT 17 16  ALKPHOS 109 101  BILITOT 0.5 0.6  PROT 5.6* 5.5*  ALBUMIN 3.0* 2.9*    Recent Labs Lab 08/13/16 0044  LIPASE 63*   No results for input(s): AMMONIA in the last 168 hours. Coagulation Profile:  Recent Labs Lab 08/13/16 0044  INR 1.09   Cardiac Enzymes:  Recent Labs Lab 08/13/16 0044  CKTOTAL 245*  TROPONINI <0.03   BNP (last 3 results) No results for input(s): PROBNP in the last 8760 hours. HbA1C: No results for input(s): HGBA1C in the last 72 hours. CBG:  Recent Labs Lab 08/13/16 0105 08/13/16 0446 08/13/16 0805  GLUCAP 351* 282* 261*   Lipid Profile: No results for input(s): CHOL, HDL, LDLCALC, TRIG, CHOLHDL, LDLDIRECT in the last 72 hours. Thyroid Function Tests:  Recent Labs  08/13/16 0455  TSH 1.488   Anemia Panel:  Recent Labs  08/13/16 0044 08/13/16 0046 08/13/16 0455  VITAMINB12  --   --  465  FOLATE   --  8.1  --   FERRITIN  --   --  86  TIBC  --   --  249*  IRON  --   --  113  RETICCTPCT 2.8  --   --    Urine analysis:    Component Value Date/Time   COLORURINE YELLOW 10/22/2013 [place] 10/22/2013 1757   LABSPEC 1.020 10/22/2013 1757   PHURINE 5.0 10/22/2013 1757   GLUCOSEU 250 (A) 10/22/2013 1757   HGBUR NEGATIVE 10/22/2013 1757   BILIRUBINUR NEGATIVE 10/22/2013 1757   KETONESUR NEGATIVE 10/22/2013 1757   PROTEINUR 30 (A) 10/22/2013 1757   UROBILINOGEN 0.2 10/22/2013 1757   NITRITE NEGATIVE 10/22/2013 1757   LEUKOCYTESUR TRACE (A) 10/22/2013 1757   Sepsis Labs: @LABRCNTIP (procalcitonin:4,lacticidven:4)  )No results found for this or any previous visit (from the past 240 hour(s)).    Radiology Studies: No results found.   Scheduled Meds: . insulin aspart  0-9 Units Subcutaneous Q4H  . insulin glargine  10 Units Subcutaneous QHS  . pantoprazole (PROTONIX) IV  40 mg Intravenous Q12H  . sertraline  25 mg Oral q morning - 10a  . sodium chloride flush  3 mL Intravenous Q12H   Continuous Infusions:   LOS: 1 day   Time Spent in minutes   45 minutes (obtaining and reviewing old records, assessing patient)  Levii Hairfield D.O. on 08/13/2016 at 10:48 AM  Between 7am to 7pm - Pager - [phone]  After 7pm go to [email or web address] - password [email or web address]  And look for the night coverage person covering for me after hours  [facility] Office  [phone]

## 2016-08-13 NOTE — Progress Notes (Signed)
   ON CALL NOTE   I ordered that patient take Harvoni 90/400 nightly at 10 PM from own supply  Gatha Mayer, MD, [facility] Gastroenterology [phone] (pager) 08/13/2016 10:05 PM

## 2016-08-13 NOTE — Progress Notes (Addendum)
Inpatient Diabetes Program Recommendations  AACE/ADA: New Consensus Statement on Inpatient Glycemic Control (2015)  Target Ranges:  Prepandial:   less than 140 mg/dL      Peak postprandial:   less than 180 mg/dL (1-2 hours)      Critically ill patients:  140 - 180 mg/dL   Results for JOYICE, MAGDA (MRN [id number]) as of 08/13/2016 10:48  Ref. Range 08/13/2016 01:05 08/13/2016 04:46 08/13/2016 08:05  Glucose-Capillary Latest Ref Range: 65 - 99 mg/dL 351 (H) 10 Lantus given 282 (H) 261 (H)   Review of Glycemic Control  Diabetes history: DM 2 Outpatient Diabetes medications: Lantus 20, Humalog 20-30 units 4x/day Current orders for Inpatient glycemic control: Lantus 10 units, Novolog Sensitive Q4 hours.  Inpatient Diabetes Program Recommendations:    Consider increasing Lantus to 16 units (0.15 units/kg)  Will follow trends while inpatient  Note per MD note patient unable to afford Lantus. If patient has Pharmacist, community she is available for Lantus copay card to get it for 0$/month  Thanks,  Tama Headings RN, MSN, [facility] Inpatient Diabetes Coordinator Team Pager [phone] (8a-5p)

## 2016-08-13 NOTE — Progress Notes (Signed)
Inpatient Diabetes Program Recommendations  AACE/ADA: New Consensus Statement on Inpatient Glycemic Control (2015)  Target Ranges:  Prepandial:   less than 140 mg/dL      Peak postprandial:   less than 180 mg/dL (1-2 hours)      Critically ill patients:  140 - 180 mg/dL   Spoke with patient about diabetes and home regimen for diabetes control. Patient reports that she is followed by her PCP for DM management. RN reports patient was on glyburide but PCP stopped due to pain she was having. Patient reported that she was unable to get Lantus at one time when her husband lost insurance due to loosing his job. She now has [employer]. Spoke with patient about [employer] insulin and to discuss with her PCP about dosing her to [facility] so she does not go without insulin.  Patient said her glucose increased into the 500's without Lantus. Discussed glucose and A1C goals. Discussed importance of checking CBGs and maintaining good CBG control to prevent long-term and short-term complications. Gave patient a copay card for Lantus. Patient verbalized understanding of information discussed and has no further questions at this time related to diabetes.  Thanks,  Tama Headings RN, MSN, [facility] Inpatient Diabetes Coordinator Team Pager [phone] (8a-5p)

## 2016-08-14 ENCOUNTER — Inpatient Hospital Stay (HOSPITAL_COMMUNITY): Payer: BLUE CROSS/BLUE SHIELD | Admitting: Anesthesiology

## 2016-08-14 ENCOUNTER — Encounter (HOSPITAL_COMMUNITY)
Admission: AD | Disposition: A | Discharge status: Home / Self Care | Payer: Self-pay | Source: Other Acute Inpatient Hospital | Attending: Internal Medicine

## 2016-08-14 ENCOUNTER — Encounter (HOSPITAL_COMMUNITY): Payer: Self-pay | Admitting: Certified Registered Nurse Anesthetist

## 2016-08-14 DIAGNOSIS — K222 Esophageal obstruction: Secondary | ICD-10-CM

## 2016-08-14 DIAGNOSIS — T39395A Adverse effect of other nonsteroidal anti-inflammatory drugs [NSAID], initial encounter: Secondary | ICD-10-CM

## 2016-08-14 DIAGNOSIS — K922 Gastrointestinal hemorrhage, unspecified: Secondary | ICD-10-CM

## 2016-08-14 DIAGNOSIS — K253 Acute gastric ulcer without hemorrhage or perforation: Secondary | ICD-10-CM

## 2016-08-14 HISTORY — PX: ESOPHAGOGASTRODUODENOSCOPY: SHX5428

## 2016-08-14 LAB — GLUCOSE, CAPILLARY
GLUCOSE-CAPILLARY: 178 mg/dL — AB (ref 65–99)
GLUCOSE-CAPILLARY: 227 mg/dL — AB (ref 65–99)
Glucose-Capillary: 164 mg/dL — ABNORMAL HIGH (ref 65–99)
Glucose-Capillary: 182 mg/dL — ABNORMAL HIGH (ref 65–99)
Glucose-Capillary: 223 mg/dL — ABNORMAL HIGH (ref 65–99)
Glucose-Capillary: 249 mg/dL — ABNORMAL HIGH (ref 65–99)

## 2016-08-14 LAB — COMPREHENSIVE METABOLIC PANEL
ALBUMIN: 3 g/dL — AB (ref 3.5–5.0)
ALK PHOS: 89 U/L (ref 38–126)
ALT: 16 U/L (ref 14–54)
AST: 16 U/L (ref 15–41)
Anion gap: 7 (ref 5–15)
BILIRUBIN TOTAL: 0.6 mg/dL (ref 0.3–1.2)
BUN: 35 mg/dL — AB (ref 6–20)
CALCIUM: 8.7 mg/dL — AB (ref 8.9–10.3)
CO2: 22 mmol/L (ref 22–32)
Chloride: 108 mmol/L (ref 101–111)
Creatinine, Ser: 1.73 mg/dL — ABNORMAL HIGH (ref 0.44–1.00)
GFR calc Af Amer: 37 mL/min — ABNORMAL LOW (ref >60)
GFR calc non Af Amer: 32 mL/min — ABNORMAL LOW (ref >60)
GLUCOSE: 170 mg/dL — AB (ref 65–99)
Potassium: 4.3 mmol/L (ref 3.5–5.1)
Sodium: 137 mmol/L (ref 135–145)
TOTAL PROTEIN: 5.7 g/dL — AB (ref 6.5–8.1)

## 2016-08-14 LAB — HEMOGLOBIN A1C
Hgb A1c MFr Bld: 10.1 % — ABNORMAL HIGH (ref 4.8–5.6)
Mean Plasma Glucose: 243 mg/dL

## 2016-08-14 LAB — CBC
HEMATOCRIT: 22.3 % — AB (ref 36.0–46.0)
HEMATOCRIT: 25 % — AB (ref 36.0–46.0)
HEMOGLOBIN: 7.7 g/dL — AB (ref 12.0–15.0)
HEMOGLOBIN: 8.8 g/dL — AB (ref 12.0–15.0)
MCH: 26.8 pg (ref 26.0–34.0)
MCH: 28.3 pg (ref 26.0–34.0)
MCHC: 34.5 g/dL (ref 30.0–36.0)
MCHC: 35.2 g/dL (ref 30.0–36.0)
MCV: 77.7 fL — ABNORMAL LOW (ref 78.0–100.0)
MCV: 80.4 fL (ref 78.0–100.0)
Platelets: 235 10*3/uL (ref 150–400)
Platelets: 208 10*3/uL (ref 150–400)
RBC: 2.87 MIL/uL — AB (ref 3.87–5.11)
RBC: 3.11 MIL/uL — ABNORMAL LOW (ref 3.87–5.11)
RDW: 13.1 % (ref 11.5–15.5)
RDW: 13.9 % (ref 11.5–15.5)
WBC: 10.4 10*3/uL (ref 4.0–10.5)
WBC: 10.5 10*3/uL (ref 4.0–10.5)

## 2016-08-14 LAB — PREPARE RBC (CROSSMATCH)

## 2016-08-14 SURGERY — EGD (ESOPHAGOGASTRODUODENOSCOPY)
Anesthesia: Monitor Anesthesia Care

## 2016-08-14 MED ORDER — PROPOFOL 500 MG/50ML IV EMUL
INTRAVENOUS | Status: DC | PRN
  Administered 2016-08-14: 120 ug/kg/min via INTRAVENOUS

## 2016-08-14 MED ORDER — SODIUM CHLORIDE 0.9 % IV SOLN
INTRAVENOUS | Status: DC
  Administered 2016-08-14: 08:00:00 via INTRAVENOUS

## 2016-08-14 MED ORDER — ONDANSETRON HCL 4 MG/2ML IJ SOLN
INTRAMUSCULAR | Status: DC | PRN
  Administered 2016-08-14: 4 mg via INTRAVENOUS

## 2016-08-14 MED ORDER — DOCUSATE SODIUM 100 MG PO CAPS
100.0000 mg | ORAL_CAPSULE | Freq: Two times a day (BID) | ORAL | Status: DC
  Administered 2016-08-14 – 2016-08-15 (×2): 100 mg via ORAL
  Filled 2016-08-14 (×2): qty 1

## 2016-08-14 MED ORDER — SODIUM CHLORIDE 0.9 % IV SOLN
Freq: Once | INTRAVENOUS | Status: AC
  Administered 2016-08-14: 12:00:00 via INTRAVENOUS

## 2016-08-14 MED ORDER — LIDOCAINE HCL (CARDIAC) 20 MG/ML IV SOLN
INTRAVENOUS | Status: DC | PRN
  Administered 2016-08-14: 60 mg via INTRATRACHEAL

## 2016-08-14 MED ORDER — LEDIPASVIR-SOFOSBUVIR 90-400 MG PO TABS
1.0000 | ORAL_TABLET | Freq: Every day | ORAL | Status: DC
  Administered 2016-08-14: 1 via ORAL

## 2016-08-14 MED ORDER — FAMOTIDINE 20 MG PO TABS
20.0000 mg | ORAL_TABLET | Freq: Two times a day (BID) | ORAL | Status: DC
  Administered 2016-08-14 – 2016-08-15 (×3): 20 mg via ORAL
  Filled 2016-08-14 (×3): qty 1

## 2016-08-14 MED ORDER — PROPOFOL 10 MG/ML IV BOLUS
INTRAVENOUS | Status: DC | PRN
  Administered 2016-08-14 (×5): 20 mg via INTRAVENOUS

## 2016-08-14 NOTE — Op Note (Signed)
[facility] Patient Name: [name] [name] Procedure Date : 08/14/2016 MRN: [id number] Attending MD: Milus Banister , MD Date of Birth: 07/15/1960 CSN: [id number] Age: 56 Admit Type: Inpatient Procedure:                Upper GI endoscopy Indications:              Abdominal pain in the right lower quadrant, heavy                            NSAID use recently (multile ibuprofen, alleve and                            ASA every day); melena, heme + stool, anemia Providers:                Milus Banister, MD, Cleda Daub, RN, Alan Mulder, Technician Referring MD:              Medicines:                Monitored Anesthesia Care Complications:            No immediate complications. Estimated blood loss:                            None. Estimated Blood Loss:     Estimated blood loss: none. Procedure:                Pre-Anesthesia Assessment:                           - Prior to the procedure, a History and Physical                            was performed, and patient medications and                            allergies were reviewed. The patient's tolerance of                            previous anesthesia was also reviewed. The risks                            and benefits of the procedure and the sedation                            options and risks were discussed with the patient.                            All questions were answered, and informed consent                            was obtained. Prior Anticoagulants: The patient has  taken no previous anticoagulant or antiplatelet                            agents. ASA Grade Assessment: III - A patient with                            severe systemic disease. After reviewing the risks                            and benefits, the patient was deemed in                            satisfactory condition to undergo the procedure.                           After obtaining  informed consent, the endoscope was                            passed under direct vision. Throughout the                            procedure, the patient's blood pressure, pulse, and                            oxygen saturations were monitored continuously. The                            EG-[id number] ([id number]) scope was introduced through the                            mouth, and advanced to the second part of duodenum.                            The upper GI endoscopy was accomplished without                            difficulty. The patient tolerated the procedure                            well. Scope In: Scope Out: Findings:      The esophagus was normal.      One non-bleeding cratered but clean based gastric ulcer with no stigmata       of bleeding was found in the gastric antrum. The lesion was 3 mm in       largest dimension. There was mild diffuse pan-gastritis. Biopsies were       taken with a cold forceps for histology (from antrum and body to check       for H. pylori).      The examined duodenum was normal. Impression:               - Clean based distal gastric ulcer (likely NSAID                            related given her heavy use  recently).                           - Mild pan-gastritis, biopsied to check for H.                            pylori. Moderate Sedation:      none Recommendation:           - Return patient to hospital ward for ongoing care.                           - Resume regular diet.                           - Continue present medications. Strict avoidance of                            NSAIDs. PPI may decrease effectiveness of Harvoni                            and so will use H2 blockers in this instance. She                            should take H2 blocker twice daily until repeat EGD                            (to confirm ulcer healing) in about 2 months.                           - Await pathology results, will start antibiotics                             if H. pylori +. Procedure Code(s):        --- Professional ---                           [phone], Esophagogastroduodenoscopy, flexible,                            transoral; with biopsy, single or multiple Diagnosis Code(s):        --- Professional ---                           K25.9, Gastric ulcer, unspecified as acute or                            chronic, without hemorrhage or perforation                           R10.31, Right lower quadrant pain CPT copyright 2016 American Medical Association. All rights reserved. The codes documented in this report are preliminary and upon coder review may  be revised to meet current compliance requirements. Milus Banister, MD 08/14/2016 8:28:19 AM This report has been signed electronically. Number of Addenda: 0

## 2016-08-14 NOTE — Progress Notes (Signed)
Pt refuses to give me her Harvoni to take to the pharmacy for verification. She states she fears they will lose the medication or that it will not be delivered at the proper time. She states her husband will take the medication home in the am. She expressed that if something were to happen to the medication it cannot be replaced due to how expensive it is.

## 2016-08-14 NOTE — Evaluation (Signed)
Occupational Therapy Evaluation Patient Details Name: [name] [name] MRN: [id number] DOB: 1960/10/23 Today's Date: 08/14/2016    History of Present Illness pt is a 56 y/o femal with pmh significant for UTI's esophageal spasm, Hep C on Harvoni, DM2, and PUD, admitted with ~35month h/o abdominal/perumbilical pain radiating to the back.  Endoscopy pending.   Clinical Impression   Pt reports she was independent with ADL PTA. Currently pt overall min guard for ADL and functional mobility. Pt planning to d/c home with supervision from family. Pt would benefit from continued skilled OT to address established goals.    Follow Up Recommendations  No OT follow up;Supervision - Intermittent    Equipment Recommendations  None recommended by OT    Recommendations for Other Services       Precautions / Restrictions Precautions Precautions: Fall Restrictions Weight Bearing Restrictions: No      Mobility Bed Mobility Overal bed mobility: Needs Assistance Bed Mobility: Supine to Sit;Sit to Supine     Supine to sit: Supervision Sit to supine: Supervision   General bed mobility comments: supervision for safety, no physical assist  Transfers Overall transfer level: Needs assistance Equipment used: None Transfers: Sit to/from Stand Sit to Stand: Min guard         General transfer comment: min guard for safety    Balance Overall balance assessment: Needs assistance Sitting-balance support: Feet supported;No upper extremity supported Sitting balance-Leahy Scale: Good     Standing balance support: No upper extremity supported;During functional activity Standing balance-Leahy Scale: Fair                             ADL either performed or assessed with clinical judgement   ADL Overall ADL's : Needs assistance/impaired Eating/Feeding: Set up;Sitting   Grooming: Set up;Sitting;Wash/dry face;Oral care   Upper Body Bathing: Set up;Supervision/ safety;Sitting   Lower  Body Bathing: Min guard;Sit to/from stand   Upper Body Dressing : Set up;Supervision/safety;Sitting   Lower Body Dressing: Min guard;Sit to/from stand   Toilet Transfer: Min guard;Ambulation Toilet Transfer Details (indicate cue type and reason): Simulated by sit to stand from EOB with functional mobility in room         Functional mobility during ADLs: Min guard General ADL Comments: Pt with c/o mild dizziness in standing, reports feeling concerned that he legs will give out on her.     Vision         Perception     Praxis      Pertinent Vitals/Pain Pain Assessment: Faces Faces Pain Scale: Hurts little more Pain Location: abdomen Pain Descriptors / Indicators: Sore;Discomfort Pain Intervention(s): Monitored during session     Hand Dominance Right   Extremity/Trunk Assessment Upper Extremity Assessment Upper Extremity Assessment: Overall WFL for tasks assessed   Lower Extremity Assessment Lower Extremity Assessment: Defer to PT evaluation   Cervical / Trunk Assessment Cervical / Trunk Assessment: Normal   Communication Communication Communication: No difficulties   Cognition Arousal/Alertness: Awake/alert Behavior During Therapy: WFL for tasks assessed/performed Overall Cognitive Status: Within Functional Limits for tasks assessed                                     General Comments       Exercises     Shoulder Instructions      Home Living Family/patient expects to be discharged to:: Private residence Living  Arrangements: Spouse/significant other;Other relatives Available Help at Discharge: Family;Other (Comment) (up to 24/7) Type of Home: House Home Access: Stairs to enter [employer] of Steps: several Entrance Stairs-Rails: Right;Left Home Layout: Other (Comment) (sunk in rooms down 2 steps)     Bathroom Shower/Tub: Occupational psychologist: Handicapped height     Home Equipment: Environmental consultant - 2 wheels;Shower  seat - built in          Prior Functioning/Environment Level of Independence: Independent                 OT Problem List: Decreased strength;Decreased activity tolerance;Impaired balance (sitting and/or standing);Decreased knowledge of use of DME or AE;Impaired sensation;Obesity;Pain;Increased edema      OT Treatment/Interventions: Self-care/ADL training;Therapeutic exercise;Energy conservation;DME and/or AE instruction;Therapeutic activities;Patient/family education;Balance training    OT Goals(Current goals can be found in the care plan section) Acute Rehab OT Goals Patient Stated Goal: go home tomorrow OT Goal Formulation: With patient Time For Goal Achievement: 08/28/16 Potential to Achieve Goals: Good ADL Goals Pt Will Transfer to Toilet: with modified independence;ambulating;regular height toilet Pt Will Perform Toileting - Clothing Manipulation and hygiene: with modified independence;sit to/from stand Pt Will Perform Tub/Shower Transfer: Shower transfer;with modified independence;ambulating;shower seat Additional ADL Goal #1: Pt will gather ADL items and perform UB/LB bathing/dressing with mod I.  OT Frequency: Min 2X/week   Barriers to D/C:            Co-evaluation              AM-PAC PT "6 Clicks" Daily Activity     Outcome Measure Help from another person eating meals?: None Help from another person taking care of personal grooming?: A Little Help from another person toileting, which includes using toliet, bedpan, or urinal?: A Little Help from another person bathing (including washing, rinsing, drying)?: A Little Help from another person to put on and taking off regular upper body clothing?: A Little Help from another person to put on and taking off regular lower body clothing?: A Little 6 Click Score: 19   End of Session    Activity Tolerance: Patient tolerated treatment well Patient left: in bed;with call bell/phone within reach  OT Visit  Diagnosis: Unsteadiness on feet (R26.81);Muscle weakness (generalized) (M62.81);Pain Pain - part of body:  (abdomen)                Time: 1355-1406 OT Time Calculation (min): 11 min Charges:  OT General Charges $OT Visit: 1 Procedure OT Evaluation $OT Eval Moderate Complexity: 1 Procedure G-Codes:     Dontarious Schaum A. Ulice Brilliant, M.S., OTR/L Pager: [place] 08/14/2016, 2:12 PM

## 2016-08-14 NOTE — H&P (View-Only) (Signed)
[facility] Consult: 8:23 AM 08/13/2016  LOS: 1 day    Referring Provider: Dr Ree Kida  Primary Care Physician:  [name], [facility] in [place].   Primary Gastroenterologist:  Dr Loletha Carrow.  Roosevelt Locks, NP for [facility] liver clinic in [place]       Reason for Consultation:       HPI: [name] [name] is a 56 y.o. female.  PMH obesity.  Recurrent UTIs.  HTN. Depression, anxiety.  Peripheral neuropathy.  DM 2, insulin requiring.  Atypical chest pain.  GERD.  Gastric ulcers. Dysphagia.   Esophageal spasm.  S/p multiple abdomino/pelvic surgeries including cholecystectomy, appendectomy, total abdominal hysterectomy/BSO.  Hep C.   As of 06/11/16 visit to NP [place], was to initiate Harvoni (12 week course planned with possible termination at 8 weeks).  Hep A/B vaccinations initiated 03/2016.  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  During course of Harvoni, pt was to stop using Omeprozole.    Long standing intermittent severe chest pain, dysphagia and choking with PO.  Sxs attributed to esophageal spasm/dyskinesia however prior manometries unremarkable.  Nonethelesss Dr Deatra Ina had performed multiple EGDs and esophageal dilatations and Botox injection date back to at least 2005 which have provided temporary relief.   Has been seen by Dr Derrill Kay at [facility] for this issue.  According to his note in 2013: "we have established a diagnosis of dumping syndrome, small bowel bacterial overgrowth and IBS. She has only had minimal response to treatment of her bacterial overgrowth".  Dr Derrill Kay treated + H Pylori in 2013.  After normal repeat manometry study in 12/2015, Dr Loletha Carrow saw : "no role for Botox injection like she has had in the past".   01/2013 Colonoscopy: normal avg risk screening study: Normal.  fup study 01/2023.     05/2011 EGD.  H Pylori + gastritis. (treated).   12/2012 EGD. Early esoph stricture.  Gastric erosions.  Balloon dilation and Botox injection to GE Jx.  06/2013 EGD.  Chest pain secondary to esoph spasm.  Grossly normal EGD.  S/p balloon dilation and Botox injection.  10/2013 EGD Grossly normal.  Underwent 15m balloon dilation and Botox injection 12/2009 esophageal manometry. Normal.    06/2013 Esophageal manometry. 12/26/2015 Esophageal manometry:  Normal study.   Bravo pH study (at least 2 times, latest in 2013 off PPI): normal level reflux.  2006, 2011, 2015 esophagrams: normal with minor GER.  Minor vomiting triggered by upper esophageal stasis in 2015  Pt had pain in right to mid upper abdomen starting mid to later 05/2016, present prior to stopping Omeprazole and starting Harvoni.  Initially had n/v but these subsided.  [facility] contrast [facility] to CKD[facility] both [facility].  Food or activity do not trigger pain, it is constant.  Pain worse with urination.  Baclofen allows her to sleep but she wakens with same pain.  Taking up to 8 Ibuprofen (with additional Motrin at times), up to 4 Aleve and up to 4 ASA 325 daily for pain.  Stools unaffected, continues brown, 2 to 3 times weekly.  No weight loss.  Pain is different than the esophageal, chest pain, dysphagia she has had historically.  Medication changes in recent weeks include stopping of [place] provide and lisinopril. She apparently has had problems with elevated potassium levels. She does report itching but no eruptions since starting Harvoni and was advised as long as she could tolerate the itching, to keep taking the [place].   Over this past few days, became dizzy/pre-syncopal with standing up.   Because of the increased dizziness yesterday she presented to [facility]. Hemoglobin there was 9, this had dropped from 11.7 in  ~ 7 days.  Stool was melenic appearing and FOBT positive. Blood  glucose was 572. Lipase was 716. Total bilirubin 0.4. Alkaline phosphatase 166. AST/ALT 20/27.  BUN/creatinine 56/1.8.  Hgb 8.1 >> 7.7, (12 in 11/2015).  MCV 77.  Platelets, coags normal. Progression to CKD stage 3 vs AKI (GFR 52 in 11/2015,  36 now).  Total CK elevated. Sugars in 300s.  On recheck her lipase 63, alk phos 109.   Pt denies bloody and tarry stool, however reported as tarry by MD DRE at [facility].     Past Medical History:  Diagnosis Date  . Anxiety   . Arthritis    "hips" (08/28/2013)  . Chronic lower back pain    10-27-13 not a problem at this time  . Depression   . Esophageal spasm   . Frequent UTI   . GERD (gastroesophageal reflux disease)   . Hepatitis C   . High cholesterol   . Hypertension   . Migraine    "monthly" (08/28/2013)  . Pneumonia    "several times"  . Type II diabetes mellitus ([place])   . Ulcer    hx gastric ulcers    Past Surgical History:  Procedure Laterality Date  . ABDOMINAL HYSTERECTOMY    . APPENDECTOMY    . BALLOON DILATION N/A 12/29/2012   Procedure: BALLOON DILATION;  Surgeon: Inda Castle, MD;  Location: [facility];  Service: Endoscopy;  Laterality: N/A;  . BALLOON DILATION N/A 06/25/2013   Procedure: BALLOON DILATION;  Surgeon: Inda Castle, MD;  Location: [facility];  Service: Endoscopy;  Laterality: N/A;  . BOTOX INJECTION  12/29/2012   Procedure: BOTOX INJECTION;  Surgeon: Inda Castle, MD;  Location: [facility];  Service: Endoscopy;;  . BOTOX INJECTION N/A 06/25/2013   Procedure: BOTOX INJECTION;  Surgeon: Inda Castle, MD;  Location: [facility];  Service: Endoscopy;  Laterality: N/A;  . CARDIAC CATHETERIZATION  X 2   /notes 08/28/2013  . CARPAL TUNNEL RELEASE Right   . CHOLECYSTECTOMY    . COLONOSCOPY  04/10/2012   Normal   . ESOPHAGEAL MANOMETRY N/A 12/26/2015   Procedure: ESOPHAGEAL MANOMETRY (EM);  Surgeon: Doran Stabler, MD;  Location: [facility];  Service: Gastroenterology;  Laterality:  N/A;  . ESOPHAGOGASTRODUODENOSCOPY N/A 12/29/2012   Procedure: ESOPHAGOGASTRODUODENOSCOPY (EGD);  Surgeon: Inda Castle, MD;  Location: [facility];  Service: Endoscopy;  Laterality: N/A;  . ESOPHAGOGASTRODUODENOSCOPY N/A 06/25/2013   Procedure: ESOPHAGOGASTRODUODENOSCOPY (EGD);  Surgeon: Inda Castle, MD;  Location: [facility];  Service: Endoscopy;  Laterality: N/A;  . ESOPHAGOGASTRODUODENOSCOPY N/A 11/03/2013   Procedure: ESOPHAGOGASTRODUODENOSCOPY (EGD);  Surgeon: Inda Castle, MD;  Location: [facility];  Service: Endoscopy;  Laterality: N/A;  Botox - ballon dilation  . FOOT FRACTURE SURGERY Right   . HEEL SPUR EXCISION Right   . TONSILLECTOMY    .  TUBAL LIGATION      Prior to Admission medications   Medication Sig Start Date End Date Taking? Authorizing Provider  Ledipasvir-Sofosbuvir (HARVONI PO) Take by mouth.   Yes [provider]  acetaminophen (TYLENOL) 500 MG tablet Take 1,000 mg by mouth 2 (two) times daily.    [provider]  cloNIDine (CATAPRES) 0.1 MG tablet Take 0.1 mg by mouth 2 (two) times daily.    [provider]  dicyclomine (BENTYL) 20 MG tablet Take 20 mg by mouth every 6 (six) hours.    [provider]  insulin lispro (HUMALOG) 100 UNIT/ML injection Inject 20 Units into the skin 2 (two) times daily.     [provider]  labetalol (NORMODYNE) 200 MG tablet Take 300 mg by mouth 2 (two) times daily.    [provider]  nitroGLYCERIN (NITRODUR - DOSED IN MG/24 HR) 0.2 mg/hr patch Place 0.2 mg onto the skin daily as needed. Apply when having chest pain    [provider]  nitroGLYCERIN (NITROSTAT) 0.4 MG SL tablet Place 0.4 mg under the tongue every 5 (five) minutes as needed for chest pain.    [provider]  sertraline (ZOLOFT) 25 MG tablet Take 25 mg by mouth every morning.     [provider]    Scheduled Meds: . insulin aspart  0-9 Units Subcutaneous Q4H  . insulin  glargine  10 Units Subcutaneous QHS  . pantoprazole (PROTONIX) IV  40 mg Intravenous Q12H  . sertraline  25 mg Oral q morning - 10a  . sodium chloride flush  3 mL Intravenous Q12H   Infusions: . sodium chloride 100 mL/hr at 08/13/16 0122   PRN Meds: acetaminophen **OR** acetaminophen, ondansetron **OR** ondansetron (ZOFRAN) IV, traMADol   Allergies as of 08/12/2016 - Review Complete 12/26/2015  Allergen Reaction Noted  . Codeine Itching 03/10/2008  . Dilaudid [hydromorphone hcl] Itching 08/28/2013    Family History  Problem Relation Age of Onset  . Hypertension Mother   . Alzheimer's disease Father   . Throat cancer Sister 83       died at age 9  . Colon cancer Neg Hx   . Rectal cancer Neg Hx   . Stomach cancer Neg Hx     Social History   Social History  . Marital status: Married    Spouse name: Lanny Hurst   . Number of children: 2  . Years of education: N/A   Occupational History  . unempoyed    Social History Main Topics  . Smoking status: Never Smoker  . Smokeless tobacco: Never Used  . Alcohol use No  . Drug use: No  . Sexual activity: Yes    Partners: Male   Other Topics Concern  . Not on file   Social History Narrative  . No narrative on file    REVIEW OF SYSTEMS: Constitutional:  Weakness, dizziness.  Generally little exercise ENT:  No nose bleeds Pulm:  No SOB or cough.   CV:  No palpitations, no LE edema.  GU:  No hematuria, no frequency GI:  See HPI Heme:  No unusual bleeding or bruising. No previous issues with anemia or needing iron supplements.   Transfusions:  Does not recall any recent transfusions. Neuro:  No headaches, no peripheral tingling or numbness Derm:  No itching, no rash or sores.  Endocrine:  No sweats or chills.  No polyuria or dysuria Immunization:  None that she recalls in recent years. Travel:  None beyond local counties  in last few months.    PHYSICAL EXAM: Vital signs in last 24 hours: Vitals:   08/12/16 2300  08/13/16 0451  BP: (!) 131/52 139/71  Pulse: 69 63  Resp: 20 20  Temp: 98.2 F (36.8 C) 98.1 F (36.7 C)   Wt Readings from Last 3 Encounters:  08/12/16 110.5 kg (243 lb 8 oz)  12/13/15 112 kg (247 lb)  03/01/14 110.7 kg (244 lb)    General: Obese, non-ill appearing somewhat anxious AAF who appears younger than stated age. Head:  No asymmetry or facial edema. No signs of head trauma.  Eyes:  No scleral icterus, no conjunctival pallor. Ears:  Not hard of hearing  Nose:  No discharge Mouth:  Oral mucosa is pink, moist, clear. Tongue midline. Neck:  No JVD, no masses, no thyromegaly. Lungs:  Excellent breath sounds which are clear. No cough. No dyspnea. Heart: RRR. No MRG. S1, S2 present. Abdomen:  Obese, soft. Hypoactive bowel sounds but no tinkling or high-pitched sounds. Tender in the upper abdomen about midway from the umbilicus to the xiphoid process. This is focal but there is no guarding or rebound.  No masses, no HSM. No bruits. No hernias. Rectal: Deferred rectal exam.   Musc/Skeltl: no joiont redness or swelling Extremities:  No CCE  Neurologic:  Oriented x 3.  No tremor or weakness Skin:  No rashes or sores Tattoos:  None seen Nodes:  No cervical adenopathy.     Psych:  Mildly anxious. Cooperative.   Intake/Output from previous day: 07/01 0701 - 07/02 0700 In: 516.7 [I.V.:516.7] Out: 600 [Urine:600] Intake/Output this shift: Total I/O In: 0  Out: 300 [Urine:300]  LAB RESULTS:  Recent Labs  08/13/16 0044 08/13/16 0455  WBC 10.5 11.0*  HGB 8.1* 7.7*  HCT 23.1* 22.1*  PLT 222 205   BMET Lab Results  Component Value Date   NA 133 (L) 08/13/2016   NA 130 (L) 08/13/2016   NA 132 (L) 10/22/2013   K 4.0 08/13/2016   K 4.4 08/13/2016   K 4.4 10/22/2013   CL 108 08/13/2016   CL 105 08/13/2016   CL 93 (L) 10/22/2013   CO2 19 (L) 08/13/2016   CO2 19 (L) 08/13/2016   CO2 23 10/22/2013   GLUCOSE 302 (H) 08/13/2016   GLUCOSE 388 (H) 08/13/2016   GLUCOSE  401 (H) 10/22/2013   BUN 48 (H) 08/13/2016   BUN 52 (H) 08/13/2016   BUN 33 (H) 10/22/2013   CREATININE 1.76 (H) 08/13/2016   CREATININE 1.83 (H) 08/13/2016   CREATININE 1.00 10/22/2013   CALCIUM 8.2 (L) 08/13/2016   CALCIUM 8.4 (L) 08/13/2016   CALCIUM 9.8 10/22/2013   LFT  Recent Labs  08/13/16 0044 08/13/16 0455  PROT 5.6* 5.5*  ALBUMIN 3.0* 2.9*  AST 17 16  ALT 17 16  ALKPHOS 109 101  BILITOT 0.5 0.6   PT/INR Lab Results  Component Value Date   INR 1.09 08/13/2016   INR 0.99 10/22/2013   Hepatitis Panel No results for input(s): HEPBSAG, HCVAB, HEPAIGM, HEPBIGM in the last 72 hours. C-Diff No components found for: CDIFF Lipase     Component Value Date/Time   LIPASE 63 (H) 08/13/2016 0044    Drugs of Abuse  No results found for: LABOPIA, COCAINSCRNUR, LABBENZ, AMPHETMU, THCU, LABBARB   RADIOLOGY STUDIES: No results found.    IMPRESSION:   *  Upper abdominal pain for about a month. Lipase is elevated, suspect pancreatitis vs PUD in region of pancreas.  s/p cholecystectomy. No biliary or pancreatic abnormalities on 2 separate, noncontrast, CT abdomen pelvis is within the last 4 weeks.  No ETOH  *  Melenic stool and acute, blood loss, anemia in setting of excessive use of aspirin and nonsteroidal medications. Rule out NSAID ulcers.  No transfusions to date.   *  Poorly controlled type 2 IDDM  *  Hx dysphagia.  Off PPI for 1 month per advisement of liver clinic.      PLAN:     *  EGD tomorrow.  Hold off on any repeat abdominal imaging for now.  Continue q 12 hour PPI.     Azucena Freed  08/13/2016, 8:23 AM Pager: [phone]  ________________________________________________________________________  Velora Heckler GI MD note:  I personally examined the patient, reviewed the data and agree with the assessment and plan described above.  SHe is taking an extraordinary amount of NSAIDs, ASA daily for several weeks. I think it is quite likely that she has NSAID  related GI bleeding, pain.  Planning on EGD tomorrow.  Her lipase was quite elevated and then completely back to normal the next day, given CT scans (2 in the past month) I doubt significant pancreatic process or current acute pancreatitis.     Owens Loffler, MD [facility] Pager [phone]

## 2016-08-14 NOTE — Progress Notes (Signed)
Pt refusing to turn over home Harvoni to pharmacy for fear of it being lost. Due to [employer] policy and protocol, this medication must be unit dose packaged, barcoded, and stored by pharmacy. Pt will hold Harvoni while inpatient and send back home with husband.  Jammie Clink S. Alford Highland, PharmD, [facility] Clinical Staff Pharmacist Pager [phone]

## 2016-08-14 NOTE — Transfer of Care (Signed)
Immediate Anesthesia Transfer of Care Note  Patient: [name] [name]  Procedure(s) Performed: Procedure(s): ESOPHAGOGASTRODUODENOSCOPY (EGD) (N/A)  Patient Location: Endoscopy Unit  Anesthesia Type:MAC  Level of Consciousness: awake, alert , oriented and patient cooperative  Airway & Oxygen Therapy: Patient Spontanous Breathing and Patient connected to nasal cannula oxygen  Post-op Assessment: Report given to RN and Post -op Vital signs reviewed and stable  Post vital signs: Reviewed and stable  Last Vitals:  Vitals:   08/14/16 0509 08/14/16 0740  BP: 137/62 (!) 151/66  Pulse: 70 77  Resp: 18 14  Temp: 36.8 C 36.9 C    Last Pain:  Vitals:   08/14/16 0740  TempSrc: Oral  PainSc: 6       Patients Stated Pain Goal: 3 (88/50/27 7412)  Complications: No apparent anesthesia complications

## 2016-08-14 NOTE — Progress Notes (Signed)
PROGRESS NOTE    [name] [name]  [name] [name] DOB: Apr 12, 1960 DOA: 08/12/2016 PCP: [name], MD   No chief complaint on file.   Brief Narrative:  HPI On 08/12/2016 by Dr. Phillips Hay [name] [name] is a 56 y.o. female with medical history significant of  depression frequent UTIs esophageal spasm GERD history of hepatitis C hypercholesteremia, HTN type 2 diabetes peptic ulcer disease Presented with almost 2 month hx  of abdominal pain periumbelical  radiating to the back sharp and stabbing, started 2 weeks before she started on Harvoni. Reports abdominal pain radiating to her right leg worse with sitting up.  Patient took Tylenol and IBuprofen. She have had some dysuria and possible blood in urine. Urgent care gave her AZO with no improvement. Did not help the pain. Reports nausea when she is eating. Feels light headed worse with standing up. Reports she was evaluated for the same Last week at [facility] they had a CT done and it was nonacute. She had CT abdomen ordered by her PCP 2 weeks ago states with out contrast also non acute. She does not look at her stools but states at [facility] she was found to have melena and was positive for blood.   Her hemoglobin went from 11.7 to 9 over the past 1 week.  She was evaluated and an Oval [name] [name] found to be within normal limits but lipase elevated 716 she was given 80 mg of Protonix and sent to [facility] for evaluation of possible Gi bleed. She has been seen by Dr. Deatra Ina with GI in the past for esophageal botox injections she tried to go back but was told that she will not be needing those injections anymore. She has history of gastric ulcers as per EGD 2014  Interim history Admitted with GIB, GI consulted, s/p EGD, pending biopsy. Will give 1uPRBC  Assessment & Plan   Abdominal pain -Has been a chronic problem, see discussion below. Patient has had 2 CT scans which were reviewed, no intra-abdominal findings to explain patient's pain. -Possibly  secondary to chronic pain versus hepatitis vs possible UTI -Lipase was elevated at 63, and trended downward to 43. Doubt this is pancreatitis, no evidence on CT scan -No recent alcohol use, not on diuretics   GI bleeding/Possibly upper GI bleed/Sympotmatic anemia/Acute on chronic anemia -Patient has been using NSAIDs to relieve her pain -patient complained of weakness and the feeling of passing out over the past several days -hemoglobin currently 7.7 (baseline around 11-12 per review of CareEverywhere) -Possibly has ulcer related to NSAID use -Continue PPI, currenlty on clear liquid diet -Gastroenterology consulted and appreciated, s/p EGD today, pending results -Anemia panel: iron 113, Ferritin 86, folate 8.1, B12 465 -Continue to monitor CBC -Given that patient's Hemoglobin has not improved, will order 1uPRBC as she has symptomatic anemia -PT consulted and recommended intermittent supervision   Acute kidney injury on Chronic kidney disease, stage III -Baseline creatinine around 1.3, however GFR stage III (reviewed CareEverywhere) -Currently creatinine 1.73 (was 1.83 on admission) -Suspect seconadary to NSAID use and blood loss -Will continue to monitor BMP  Essential hypertension -BP meds held due to possibly GI Bleed and the risk of hypotension and BP has been stable -Will continue to monitor and add on medications as needed -Per med rec: home meds labetalol, clonidine  Diabetes mellitus, type II -Continue lantus and ISS with CBG monitoring   Dysuria  -Patient complains of abdominal pain and painful urination -UA shows trace leukocytes, WBC 0-5, rare bacteria  -  Pending urine culture  History of hepatitis C -patient will need to continue outpatient follow up, [place], NP with [employer] [facility], [facility] -Patient will hold Harvoni while inpatient   Depression -Continue zoloft  Chronic pain -Per review of patient's chart in  [place]: -Patient has had chronic chest pain which she believes is secondary to her esophagus, and has had Botox injections in her esophagus before by gastroenterology specialist. She also has complaints of leg cramps which she has been getting baclofen and Neurontin 4 without resolution of her symptoms. Patient has been following up with her primary care physician for these things. -Patient is also followed up with cardiology at [name]: She has had 10 years of intermittent chest pain. Her blood pressure medications have been changed. Per cardiology note from 12/2015, chest pain was noncardiac in nature, suspect esophageal versus possibly hypertension related. Hypertensive medication changed. -Per patient, she has been using lots of over-the-counter NSAIDs she was not getting any pain medication.  DVT Prophylaxis  SCDs  Code Status: Full  Family Communication: None at bedside  Disposition Plan: Admitted. Pending results of EGD. Likely discharge 7/4.  Consultants Gastroenterology  Procedures  EGD  Antibiotics   Anti-infectives    Start     Dose/Rate Route Frequency Ordered Stop   08/13/16 2230  Ledipasvir-Sofosbuvir 90-400 MG TABS 1 tablet     1 tablet Oral Daily at bedtime 08/13/16 2228        Subjective:   [name] seen and examined today.  Patient feeling better today. Denies chest pain, shortness of breath, nausea or vomiting, diarrhea, constipation. Continues to have abdominal pain located on the right side. Continues to have some dizziness.   Objective:   Vitals:   08/14/16 0740 08/14/16 0817 08/14/16 0825 08/14/16 0835  BP: (!) 151/66 (!) 92/38 (!) 114/46   Pulse: 77 75 77 72  Resp: 14 16 15 18   Temp: 98.5 F (36.9 C) 98.4 F (36.9 C)    TempSrc: Oral Oral    SpO2: 99% 98% 100% 98%  Weight: 110.5 kg (243 lb 8 oz)     Height: 5\' 5"  (1.651 m)       Intake/Output Summary (Last 24 hours) at 08/14/16 1049 Last data filed at 08/14/16 0813  Gross per 24  hour  Intake              320 ml  Output              200 ml  Net              120 ml   Filed Weights   08/12/16 2300 08/14/16 0740  Weight: 110.5 kg (243 lb 8 oz) 110.5 kg (243 lb 8 oz)   Exam  General: Well developed, well nourished, NAD, appears stated age  HEENT: NCAT, mucous membranes moist.   Cardiovascular: S1 S2 auscultated, RRR, no murmurs  Respiratory: Clear to auscultation bilaterally with equal chest rise  Abdomen: Soft, obese, Epigastric TTP, nondistended, + bowel sounds  Extremities: warm dry without cyanosis clubbing or edema  Neuro: AAOx3, nonfocal  Psych: Appropriate mood and affect  Data Reviewed: I have personally reviewed following labs and imaging studies  CBC:  Recent Labs Lab 08/13/16 0044 08/13/16 0455 08/14/16 0319  WBC 10.5 11.0* 10.4  HGB 8.1* 7.7* 7.7*  HCT 23.1* 22.1* 22.3*  MCV 77.3* 77.5* 77.7*  PLT 222 205 366   Basic Metabolic Panel:  Recent Labs Lab 08/13/16 0044 08/13/16  0455 08/14/16 0319  NA 130* 133* 137  K 4.4 4.0 4.3  CL 105 108 108  CO2 19* 19* 22  GLUCOSE 388* 302* 170*  BUN 52* 48* 35*  CREATININE 1.83* 1.76* 1.73*  CALCIUM 8.4* 8.2* 8.7*  MG  --  2.0  --   PHOS  --  3.0  --    GFR: Estimated Creatinine Clearance: 45.5 mL/min (A) (by C-G formula based on SCr of 1.73 mg/dL (H)). Liver Function Tests:  Recent Labs Lab 08/13/16 0044 08/13/16 0455 08/14/16 0319  AST 17 16 16   ALT 17 16 16   ALKPHOS 109 101 89  BILITOT 0.5 0.6 0.6  PROT 5.6* 5.5* 5.7*  ALBUMIN 3.0* 2.9* 3.0*    Recent Labs Lab 08/13/16 0044 08/13/16 0447  LIPASE 63* 46   No results for input(s): AMMONIA in the last 168 hours. Coagulation Profile:  Recent Labs Lab 08/13/16 0044  INR 1.09   Cardiac Enzymes:  Recent Labs Lab 08/13/16 0044  CKTOTAL 245*  TROPONINI <0.03   BNP (last 3 results) No results for input(s): PROBNP in the last 8760 hours. HbA1C:  Recent Labs  08/13/16 0044  HGBA1C 10.1*    CBG:  Recent Labs Lab 08/13/16 2006 08/13/16 2259 08/14/16 0033 08/14/16 0503 08/14/16 0914  GLUCAP 307* 195* 182* 223* 178*   Lipid Profile: No results for input(s): CHOL, HDL, LDLCALC, TRIG, CHOLHDL, LDLDIRECT in the last 72 hours. Thyroid Function Tests:  Recent Labs  08/13/16 0455  TSH 1.488   Anemia Panel:  Recent Labs  08/13/16 0044 08/13/16 0046 08/13/16 0455  VITAMINB12  --   --  465  FOLATE  --  8.1  --   FERRITIN  --   --  86  TIBC  --   --  249*  IRON  --   --  113  RETICCTPCT 2.8  --   --    Urine analysis:    Component Value Date/Time   COLORURINE YELLOW 08/13/2016 1043   APPEARANCEUR CLEAR 08/13/2016 1043   LABSPEC 1.020 08/13/2016 1043   PHURINE 5.5 08/13/2016 1043   GLUCOSEU NEGATIVE 08/13/2016 1043   HGBUR TRACE (A) 08/13/2016 1043   BILIRUBINUR NEGATIVE 08/13/2016 1043   KETONESUR NEGATIVE 08/13/2016 1043   PROTEINUR 100 (A) 08/13/2016 1043   UROBILINOGEN 0.2 10/22/2013 1757   NITRITE NEGATIVE 08/13/2016 1043   LEUKOCYTESUR TRACE (A) 08/13/2016 1043   Sepsis Labs: @LABRCNTIP (procalcitonin:4,lacticidven:4)  )No results found for this or any previous visit (from the past 240 hour(s)).    Radiology Studies: No results found.   Scheduled Meds: . famotidine  20 mg Oral BID  . insulin aspart  0-9 Units Subcutaneous Q4H  . insulin glargine  10 Units Subcutaneous QHS  . Ledipasvir-Sofosbuvir  1 tablet Oral QHS  . pantoprazole (PROTONIX) IV  40 mg Intravenous Q12H  . sertraline  25 mg Oral q morning - 10a  . sodium chloride flush  3 mL Intravenous Q12H   Continuous Infusions: . sodium chloride       LOS: 2 days   Time Spent in minutes   30 minutes  Toshiyuki Fredell D.O. on 08/14/2016 at 10:49 AM  Between 7am to 7pm - Pager - [phone]  After 7pm go to [email or web address] - password [email or web address]  And look for the night coverage person covering for me after hours  [facility] Office  [phone]

## 2016-08-14 NOTE — Anesthesia Postprocedure Evaluation (Signed)
Anesthesia Post Note  Patient: [name] [name]  Procedure(s) Performed: Procedure(s) (LRB): ESOPHAGOGASTRODUODENOSCOPY (EGD) (N/A)     Patient location during evaluation: PACU Anesthesia Type: MAC Level of consciousness: awake and alert Pain management: pain level controlled Vital Signs Assessment: post-procedure vital signs reviewed and stable Respiratory status: spontaneous breathing and respiratory function stable Cardiovascular status: stable Anesthetic complications: no    Last Vitals:  Vitals:   08/14/16 0825 08/14/16 0835  BP: (!) 114/46   Pulse: 77 72  Resp: 15 18  Temp:      Last Pain:  Vitals:   08/14/16 0817  TempSrc: Oral  PainSc: Asleep                 Audie Stayer DANIEL

## 2016-08-14 NOTE — Care Management Note (Signed)
Case Management Note  Patient Details  Name: [name] [name] MRN: [id number] Date of Birth: [birth year]  Subjective/Objective:      Admitted with [facility], pmh significant for UTI's esophageal spasm, Hep C on Harvoni, DM2, and PUD. From home with family (spouse and brother). PTA independent with ADL's, no usage of  assistive devices.      S/P EGD 08/14/2016  Adela Lank (Spouse) Everett Graff (Daughter)     [phone]       PCP: [name]  Action/Plan: GI following....Marland KitchenMarland KitchenCM following for disposition needs  Expected Discharge Date:                  Expected Discharge Plan:     In-House Referral:     Discharge planning Services  CM Consult  Post Acute Care Choice:    Choice offered to:     DME Arranged:    DME Agency:     HH Arranged:    HH Agency:     Status of Service:  In process, will continue to follow  If discussed at Long Length of Stay Meetings, dates discussed:    Additional Comments:  Sharin Mons, RN 08/14/2016, 10:18 AM

## 2016-08-14 NOTE — Interval H&P Note (Signed)
History and Physical Interval Note:  08/14/2016 7:48 AM  [name] [name]  has presented today for surgery, with the diagnosis of Melenic stools, blood loss anemia, pancreatitis  The various methods of treatment have been discussed with the patient and family. After consideration of risks, benefits and other options for treatment, the patient has consented to  Procedure(s): ESOPHAGOGASTRODUODENOSCOPY (EGD) (N/A) as a surgical intervention .  The patient's history has been reviewed, patient examined, no change in status, stable for surgery.  I have reviewed the patient's chart and labs.  Questions were answered to the patient's satisfaction.     Milus Banister

## 2016-08-14 NOTE — Progress Notes (Signed)
PT Cancellation Note  Patient Details Name: [name] [name] MRN: [id number] DOB: [birth year]   Cancelled Treatment:    Reason Eval/Treat Not Completed: (P)  (receiving blood transfusion will f/u per POC.  )   Shevawn Langenberg Eli Hose 08/14/2016, 4:37 PM  Governor Rooks, PTA pager [phone]

## 2016-08-14 NOTE — Anesthesia Procedure Notes (Signed)
Procedure Name: MAC Date/Time: 08/14/2016 7:59 AM Performed by: Salli Quarry Sameer Teeple Pre-anesthesia Checklist: Patient identified, Emergency Drugs available, Suction available and Patient being monitored Patient Re-evaluated:Patient Re-evaluated prior to inductionOxygen Delivery Method: Nasal cannula

## 2016-08-14 NOTE — Progress Notes (Signed)
OT Cancellation Note  Patient Details Name: [name] [name] MRN: [id number] DOB: 1960-06-16   Cancelled Treatment:    Reason Eval/Treat Not Completed: Patient at procedure or test/ unavailable. Will follow up as time allows.  Binnie Kand M.S., OTR/L Pager: [phone]  08/14/2016, 7:39 AM

## 2016-08-15 ENCOUNTER — Encounter (HOSPITAL_COMMUNITY): Payer: Self-pay | Admitting: Gastroenterology

## 2016-08-15 LAB — BPAM RBC
Blood Product Expiration Date: 201807272359
ISSUE DATE / TIME: 201807031431
Unit Type and Rh: 5100

## 2016-08-15 LAB — TYPE AND SCREEN
ABO/RH(D): O POS
Antibody Screen: NEGATIVE
UNIT DIVISION: 0

## 2016-08-15 LAB — CBC
HCT: 25.7 % — ABNORMAL LOW (ref 36.0–46.0)
HEMOGLOBIN: 8.7 g/dL — AB (ref 12.0–15.0)
MCH: 27.4 pg (ref 26.0–34.0)
MCHC: 33.9 g/dL (ref 30.0–36.0)
MCV: 81.1 fL (ref 78.0–100.0)
Platelets: 196 10*3/uL (ref 150–400)
RBC: 3.17 MIL/uL — AB (ref 3.87–5.11)
RDW: 13.8 % (ref 11.5–15.5)
WBC: 9.3 10*3/uL (ref 4.0–10.5)

## 2016-08-15 LAB — URINE CULTURE: Special Requests: NORMAL

## 2016-08-15 LAB — BASIC METABOLIC PANEL
ANION GAP: 7 (ref 5–15)
BUN: 25 mg/dL — ABNORMAL HIGH (ref 6–20)
CHLORIDE: 109 mmol/L (ref 101–111)
CO2: 20 mmol/L — ABNORMAL LOW (ref 22–32)
CREATININE: 1.59 mg/dL — AB (ref 0.44–1.00)
Calcium: 8.4 mg/dL — ABNORMAL LOW (ref 8.9–10.3)
GFR calc non Af Amer: 36 mL/min — ABNORMAL LOW (ref >60)
GFR, EST AFRICAN AMERICAN: 41 mL/min — AB (ref >60)
Glucose, Bld: 164 mg/dL — ABNORMAL HIGH (ref 65–99)
POTASSIUM: 4.1 mmol/L (ref 3.5–5.1)
SODIUM: 136 mmol/L (ref 135–145)

## 2016-08-15 LAB — GLUCOSE, CAPILLARY
GLUCOSE-CAPILLARY: 154 mg/dL — AB (ref 65–99)
GLUCOSE-CAPILLARY: 168 mg/dL — AB (ref 65–99)
GLUCOSE-CAPILLARY: 203 mg/dL — AB (ref 65–99)
Glucose-Capillary: 234 mg/dL — ABNORMAL HIGH (ref 65–99)

## 2016-08-15 MED ORDER — TRAMADOL HCL 50 MG PO TABS
50.0000 mg | ORAL_TABLET | Freq: Four times a day (QID) | ORAL | 0 refills | Status: DC | PRN
Start: 2016-08-15 — End: 2019-02-17

## 2016-08-15 MED ORDER — PANTOPRAZOLE SODIUM 40 MG PO TBEC: 40.0000 mg | DELAYED_RELEASE_TABLET | Freq: Two times a day (BID) | ORAL | 0 refills | Status: DC

## 2016-08-15 MED ORDER — FAMOTIDINE 20 MG PO TABS: 20.0000 mg | ORAL_TABLET | Freq: Two times a day (BID) | ORAL | 0 refills | Status: DC

## 2016-08-15 MED ORDER — FLEET ENEMA 7-19 GM/118ML RE ENEM: 1.0000 | ENEMA | Freq: Once | RECTAL | Status: DC

## 2016-08-15 MED ORDER — PANTOPRAZOLE SODIUM 40 MG PO TBEC: 40.0000 mg | DELAYED_RELEASE_TABLET | Freq: Two times a day (BID) | ORAL | Status: DC

## 2016-08-15 MED ORDER — POLYETHYLENE GLYCOL 3350 17 G PO PACK: 17.0000 g | PACK | Freq: Every day | ORAL | 0 refills | Status: DC | PRN

## 2016-08-15 MED ORDER — POLYETHYLENE GLYCOL 3350 17 G PO PACK
17.0000 g | PACK | Freq: Every day | ORAL | Status: DC
  Administered 2016-08-15: 17 g via ORAL
  Filled 2016-08-15: qty 1

## 2016-08-15 MED ORDER — DOCUSATE SODIUM 100 MG PO CAPS: 100.0000 mg | ORAL_CAPSULE | Freq: Two times a day (BID) | ORAL | 0 refills | Status: DC

## 2016-08-15 MED ORDER — BISACODYL 10 MG RE SUPP
10.0000 mg | Freq: Once | RECTAL | Status: AC
  Administered 2016-08-15: 10 mg via RECTAL
  Filled 2016-08-15: qty 1

## 2016-08-15 NOTE — Progress Notes (Signed)
CROSS COVER [facility]GI Subjective: Patient seems to be when doing well today anticipating discharge she denies having any abdominal pain nausea vomiting. She does have some constipation and has received a suppository this morning with no results so far.  Objective: Vital signs in last 24 hours: Temp:  [98.3 F (36.8 C)-98.9 F (37.2 C)] 98.4 F (36.9 C) (07/03 2132) Pulse Rate:  [65-70] 67 (07/04 0442) Resp:  [16-18] 18 (07/04 0442) BP: (131-148)/(53-66) 131/61 (07/04 0442) SpO2:  [95 %-99 %] 95 % (07/04 0442) Last BM Date: 08/11/16  Intake/Output from previous day: 07/03 0701 - 07/04 0700 In: 527.5 [I.V.:200; Blood:327.5] Out: 0  Intake/Output this shift: No intake/output data recorded.  General appearance: alert, appears stated age, no distress and morbidly obese Resp: clear to auscultation bilaterally Cardio: regular rate and rhythm, S1, S2 normal, no murmur, click, rub or gallop GI: soft, non-tender; bowel sounds normal; no masses,  no organomegaly Extremities: extremities normal, atraumatic, no cyanosis or edema  Lab Results:  Recent Labs  08/14/16 0319 08/14/16 2108 08/15/16 0633  WBC 10.4 10.5 9.3  HGB 7.7* 8.8* 8.7*  HCT 22.3* 25.0* 25.7*  PLT 235 208 196   BMET  Recent Labs  08/13/16 0455 08/14/16 0319 08/15/16 0633  NA 133* 137 136  K 4.0 4.3 4.1  CL 108 108 109  CO2 19* 22 20*  GLUCOSE 302* 170* 164*  BUN 48* 35* 25*  CREATININE 1.76* 1.73* 1.59*  CALCIUM 8.2* 8.7* 8.4*   LFT  Recent Labs  08/14/16 0319  PROT 5.7*  ALBUMIN 3.0*  AST 16  ALT 16  ALKPHOS 89  BILITOT 0.6   PT/INR  Recent Labs  08/13/16 0044  LABPROT 14.2  INR 1.09   Medications: I have reviewed the patient's current medications.  Assessment/Plan: 1) Anemia with GU noted on EGD-secondary to NSAID use. Continue PPI's. Avoid all NSAIDS.  2) Constipation we'll try MiraLAX today.  3) Hepatitis C on her bony followed by Garry Heater fishnet at [facility] hepatitis clinic  LOS: 3 days   Journiee Feldkamp 08/15/2016, 9:42 AM

## 2016-08-15 NOTE — Discharge Summary (Addendum)
[name] [name], is a 56 y.o. female  DOB [birth year]  MRN [id number].  Admission date:  08/12/2016  Admitting Physician  Toy Baker, MD  Discharge Date:  08/15/2016   Primary MD  [name], MD  Recommendations for primary care physician for things to follow:   GI bleeding secondary to PUD, Sympotmatic anemia/Acute on chronic anemia -Patient has been using NSAIDs to relieve her pain -patient complained of weakness and the feeling of passing out over the past several days -hemoglobin currently 7.7 (baseline around 11-12 per review of CareEverywhere) -Possibly has ulcer related to NSAID use -Continue PPI, currenlty on clear liquid diet -Gastroenterology consulted and appreciated, s/p EGD today, pending results -Anemia panel: iron 113, Ferritin 86, folate 8.1, B12 465 -Continue to monitor CBC -Given that patient's Hemoglobin has not improved, will order 1uPRBC as she has symptomatic anemia Protonix=>  Pepcid 37m po bid due to fact that PPI can reduce efficacy of Harvoni Please f/u with [name] in 2 weeks.  F/u with GI, [name] 4 weeks, may need repeat EGD in 8 weeks.  Check cbc in 1 week w pcp  Abdominal pain -Has been a chronic problem, see discussion below. Patient has had 2 CT scans which were reviewed, no intra-abdominal findings to explain patient's pain. -Possibly secondary to chronic pain versus hepatitis vs possible UTI -Lipase was elevated at 63, and trended downward to 43. Doubt this is pancreatitis, no evidence on CT scan -No recent alcohol use, not on diuretics    Acute kidney injury on Chronic kidney disease, stage III -Baseline creatinine around 1.3, however GFR stage III (reviewed CareEverywhere) -Currently creatinine 1.73 (was 1.83 on admission) -Suspect seconadary to NSAID use and blood loss Please f/u with PCP in 1-2weeks for bmp  Essential  hypertension Continue home medications  Diabetes mellitus, type II Resume home medication  Dysuria resolved -Urine culture 7/3 => negative  History of hepatitis C -patient will need to continue outpatient follow up, [place] [facility] Restart Harvoni Please see [name] in 2weeks  Depression -Continue zoloft  Chronic pain -Per review of patient's chart in CareEverywhere: -Patient has had chronic chest pain which she believes is secondary to her esophagus, and has had Botox injections in her esophagus before by gastroenterology specialist. She also has complaints of leg cramps which she has been getting baclofen and Neurontin 4 without resolution of her symptoms. Patient has been following up with her primary care physician for these things. -[facility] She has had 10 years of intermittent chest pain. Her blood pressure medications have been changed. Per cardiology note from 12/2015, chest pain was noncardiac in nature, suspect esophageal versus possibly hypertension related. Hypertensive medication changed. -Per patient, she has been using lots of over-the-counter NSAIDs she was not getting any pain medication. Start Tramadol 548mpo q6h prn    Admission Diagnosis  GI BLEED Melenic stools, blood loss anemia, pancreatitis  Discharge Diagnosis  GI BLEED Melenic stools, blood loss anemia, pancreatitis    Active Problems:   DM (diabetes mellitus), type 2 with neurological complications (HCC)   Essential hypertension   Stricture and stenosis of esophagus   Melena   Occult blood in stools   GI bleed due to NSAIDs   Abdominal pain   Acute gastric ulcer without hemorrhage or perforation      Past Medical History:  Diagnosis Date  . Anxiety   . Arthritis    "hips" (08/28/2013)  . Chronic lower back pain    10-27-13 not a problem at this time  . Depression   .  Esophageal spasm   . Frequent UTI   . GERD (gastroesophageal reflux disease)   . Hepatitis C   . High cholesterol   . Hypertension   . Migraine    "monthly" (08/28/2013)  . Pneumonia    "several times"  . Type II diabetes mellitus ([place])   . Ulcer    hx gastric ulcers    Past Surgical History:  Procedure Laterality Date  . ABDOMINAL HYSTERECTOMY    . APPENDECTOMY    . BALLOON DILATION N/A 12/29/2012   Procedure: BALLOON DILATION;  Surgeon: Inda Castle, MD;  Location: [facility];  Service: Endoscopy;  Laterality: N/A;  . BALLOON DILATION N/A 06/25/2013   Procedure: BALLOON DILATION;  Surgeon: Inda Castle, MD;  Location: [facility];  Service: Endoscopy;  Laterality: N/A;  . BOTOX INJECTION  12/29/2012   Procedure: BOTOX INJECTION;  Surgeon: Inda Castle, MD;  Location: [facility];  Service: Endoscopy;;  . BOTOX INJECTION N/A 06/25/2013   Procedure: BOTOX INJECTION;  Surgeon: Inda Castle, MD;  Location: [facility];  Service: Endoscopy;  Laterality: N/A;  . CARDIAC CATHETERIZATION  X 2   /notes 08/28/2013  . CARPAL TUNNEL RELEASE Right   . CHOLECYSTECTOMY    . COLONOSCOPY  04/10/2012   Normal   . ESOPHAGEAL MANOMETRY N/A 12/26/2015   Procedure: ESOPHAGEAL MANOMETRY (EM);  Surgeon: Doran Stabler, MD;  Location: [facility];  Service: Gastroenterology;  Laterality: N/A;  . ESOPHAGOGASTRODUODENOSCOPY N/A 12/29/2012   Procedure: ESOPHAGOGASTRODUODENOSCOPY (EGD);  Surgeon: Inda Castle, MD;  Location: [facility];  Service: Endoscopy;  Laterality: N/A;  . ESOPHAGOGASTRODUODENOSCOPY N/A 06/25/2013   Procedure: ESOPHAGOGASTRODUODENOSCOPY (EGD);  Surgeon: Inda Castle, MD;  Location: [facility];  Service: Endoscopy;  Laterality: N/A;  . ESOPHAGOGASTRODUODENOSCOPY N/A 11/03/2013   Procedure: ESOPHAGOGASTRODUODENOSCOPY (EGD);  Surgeon: Inda Castle, MD;  Location: [facility];  Service: Endoscopy;  Laterality: N/A;  Botox - ballon dilation  . FOOT FRACTURE  SURGERY Right   . HEEL SPUR EXCISION Right   . TONSILLECTOMY    . TUBAL LIGATION         HPI  from the history and physical done on the day of admission:      56 y.o. female with medical history significant of  depression frequent UTIs esophageal spasm GERD history of hepatitis C hypercholesteremia, HTN type 2 diabetes peptic ulcer disease  Presented with almost 2 month hx  of abdominal pain periumbelical  radiating to the back sharp and stabbing, started 2 weeks before she started on Harvoni. Reports abdominal pain radiating to her right leg worse with sitting up.  Patient took Tylenol and IBuprofen. She have had some dysuria and possible blood in urine. Urgent care gave her AZO with no improvement. Did not help the pain. Reports nausea when she is eating. Feels light headed  worse with standing up. Reports she was evaluated for the same Last week at [facility] they had a CT done and it was nonacute. She had CT abdomen ordered by her PCP 2 weeks ago states with out contrast also non acute. She does not look at her stools but states at [facility] she was found to have melena and was positive for blood.   Her hemoglobin went from 11.7 to 9 over the past 1 week.  She was evaluated and an [place] LFTs found to be within normal limits but lipase elevated 716 she was given 80 mg of Protonix and sent to [facility] for evaluation of possible Gi bleed.  She has been seen by Dr. Deatra Ina with GI in the past for esophageal botox injections she tried to go back but was told that she will not be needing those injections anymore. She has history of gastric ulcers as per EGD 2014   Regarding pertinent Chronic problems: [facility] history with esophageal spasms causing chest pain has had Botox injections in her esophagus History of diabetes on sliding scale and Lantus 20 units, complicated by neuropathy. History of Hep C on Harvoni  IN ER:  No data recorded.      on arrival     ED Triage Vitals  Enc Vitals Group      BP      Pulse      Resp      Temp      Temp src      SpO2      Weight      Height      Head Circumference      Peak Flow      Pain Score      Pain Loc      Pain Edu?      Excl. in [place]?    Patient initially presented to [facility] ER but  was transferred to [facility] due to lack of GI coverage for the week   At [facility]: WBC 10.2 Hg 9.0 Down from 11.7 1 week ago plt 198 ABG 7.340/35/85 Na 130 K 5.0 bicarb20 AG 16Cr 1.8Glucose 572 Lipase 716 Total bili 0.4  AST 28 ALT 27 Alk.phosph166 UA ketones neg, glucose and protein elevated Following Medications were ordered in ER:   HAd CT abd last week per reprots ws non acute  ER provider discussed case with:  Dr. Alcario Drought who has accepted patient  Hospitalist was called for admission for Possible Gi bleed melena hem positive stools     Hospital Course:     Pt was admitted for Anemia Hgb 11.7=> 9.0.  Pt reported melena at [facility].  Pt was evaluated by GI , Dr. Owens Loffler and placed on q12 hour PPI.   Pt was transfused with 1 unit prbc.  Pt had EGD on 7/3 =>   The esophagus was normal. One non-bleeding cratered but clean based gastric ulcer with no stigmata of bleeding was found in the gastric antrum. The lesion was 3 mm in largest dimension. There was mild diffuse pan-gastritis. Biopsies were taken with a cold forceps for histology (from antrum and body to check for H. pylori). The examined duodenum was normal.  Impression: - Clean based distal gastric ulcer (likely NSAID related given her heavy use recently). - Mild pan-gastritis, biopsied to check for H. Pylori.  Biopsy was negative for H. Pylori.    Pt appears to have constipation this am.  She was given miralax, dulcolax and a fleet enema.  Pt having a BM and feels better and will be discharge to home.      Follow UP  Follow-up Information    Sistasis, Clair Gulling, MD Follow up in 1 week(s).   Specialty:  Family Medicine Contact  information: [address] [address] [place], CRNP Follow up in 1 week(s).   Specialty:  Nurse Practitioner Contact information: [address] [place] [address] [phone]        Milus Banister, MD Follow up in 4 week(s).   Specialty:  Gastroenterology Contact information: [address] [place] [address] [phone]            Consults obtained - gastroenterology  Discharge Condition: stable  Diet and Activity recommendation: See Discharge Instructions below  Discharge Instructions      Discharge Medications     Allergies as of 08/15/2016      Reactions   Codeine Itching   No hives or rash, takes benadryl   Dilaudid [hydromorphone Hcl] Itching   "mild itching hrs after dilaudid given"      Medication List    TAKE these medications   acetaminophen 500 MG tablet Commonly known as:  TYLENOL Take 1,000 mg by mouth 2 (two) times daily.   cloNIDine 0.1 MG tablet Commonly known as:  CATAPRES Take 0.1 mg by mouth daily.   docusate sodium 100 MG capsule Commonly known as:  COLACE Take 1 capsule (100 mg total) by mouth 2 (two) times daily.   famotidine 20 MG tablet Commonly known as:  PEPCID Take 1 tablet (20 mg total) by mouth 2 (two) times daily.   HARVONI PO Take 1 tablet by mouth at bedtime.   insulin lispro 100 UNIT/ML injection Commonly known as:  HUMALOG Inject 20 Units into the skin 2 (two) times daily.   labetalol 200 MG tablet Commonly known as:  NORMODYNE Take 300 mg by mouth 2 (two) times daily.   nitroGLYCERIN 0.4 MG SL tablet Commonly known as:  NITROSTAT Place 0.4 mg under the tongue every 5 (five) minutes as needed for chest pain.   polyethylene glycol packet Commonly known as:  MIRALAX / GLYCOLAX Take 17 g by mouth daily as needed.   sertraline 25 MG tablet Commonly known as:  ZOLOFT Take 25 mg by mouth every morning.   traMADol 50 MG tablet Commonly known as:   ULTRAM Take 1 tablet (50 mg total) by mouth every 6 (six) hours as needed for moderate pain.       Major procedures and Radiology Reports - PLEASE review detailed and final reports for all details, in brief -      No results found.  Micro Results     Recent Results (from the past 240 hour(s))  Culture, Urine     Status: Abnormal   Collection Time: 08/14/16 12:00 PM  Result Value Ref Range Status   Specimen Description URINE, RANDOM  Final   Special Requests Normal  Final   Culture <10,000 COLONIES/mL INSIGNIFICANT GROWTH (A)  Final   Report Status 08/15/2016 FINAL  Final       Today   Subjective    [name] [name] today denies fever, chills, abd pain, diarrhea, brbpr.  Pt has constipation, resolved.   no headache,no chest pain,no new weakness tingling or numbness, feels much better wants to go home today.    Objective   Blood pressure 131/61, pulse 67, temperature 98.4 F (36.9 C), temperature  source Oral, resp. rate 18, height 5' 5"  (1.651 m), weight 110.5 kg (243 lb 8 oz), SpO2 95 %.   Intake/Output Summary (Last 24 hours) at 08/15/16 1358 Last data filed at 08/14/16 1830  Gross per 24 hour  Intake            327.5 ml  Output                0 ml  Net            327.5 ml    Exam Awake Alert, Oriented x 3, No new F.N deficits, Normal affect [place].AT,PERRAL Supple Neck,No JVD, No cervical lymphadenopathy appriciated.  Symmetrical Chest wall movement, Good air movement bilaterally, CTAB RRR,No Gallops,Rubs or new Murmurs, No Parasternal Heave +ve B.Sounds, Abd Soft, Non tender, No organomegaly appriciated, No rebound -guarding or rigidity. No Cyanosis, Clubbing or edema, No new Rash or bruise   Data Review   CBC w Diff:  Lab Results  Component Value Date   WBC 9.3 08/15/2016   HGB 8.7 (L) 08/15/2016   HCT 25.7 (L) 08/15/2016   PLT 196 08/15/2016   LYMPHOPCT 32 10/22/2013   MONOPCT 6 10/22/2013   EOSPCT 1 10/22/2013   BASOPCT 0 10/22/2013    CMP:  Lab  Results  Component Value Date   NA 136 08/15/2016   K 4.1 08/15/2016   CL 109 08/15/2016   CO2 20 (L) 08/15/2016   BUN 25 (H) 08/15/2016   CREATININE 1.59 (H) 08/15/2016   PROT 5.7 (L) 08/14/2016   ALBUMIN 3.0 (L) 08/14/2016   BILITOT 0.6 08/14/2016   ALKPHOS 89 08/14/2016   AST 16 08/14/2016   ALT 16 08/14/2016  .   Total Time in preparing paper work, data evaluation and todays exam - 42 minutes  Jani Gravel M.D on 08/15/2016 at 1:58 PM  [facility]   Office  [phone]

## 2016-08-15 NOTE — Progress Notes (Signed)
Simmie Davies to be D/C'd Home per MD order.  Discussed with the patient and all questions fully answered.  VSS, Skin clean, dry and intact without evidence of skin break down, no evidence of skin tears noted. IV catheter discontinued intact. Site without signs and symptoms of complications. Dressing and pressure applied.  An After Visit Summary was printed and given to the patient. Patient received prescription.  D/c education completed with patient/family including follow up instructions, medication list, d/c activities limitations if indicated, with other d/c instructions as indicated by MD - patient able to verbalize understanding, all questions fully answered.   Patient instructed to return to ED, call 911, or call MD for any changes in condition.   Patient escorted via [place], and D/C home via private auto. Allergies as of 08/15/2016      Reactions   Codeine Itching   No hives or rash, takes benadryl   Dilaudid [hydromorphone Hcl] Itching   "mild itching hrs after dilaudid given"      Medication List    TAKE these medications   acetaminophen 500 MG tablet Commonly known as:  TYLENOL Take 1,000 mg by mouth 2 (two) times daily.   cloNIDine 0.1 MG tablet Commonly known as:  CATAPRES Take 0.1 mg by mouth daily.   docusate sodium 100 MG capsule Commonly known as:  COLACE Take 1 capsule (100 mg total) by mouth 2 (two) times daily.   famotidine 20 MG tablet Commonly known as:  PEPCID Take 1 tablet (20 mg total) by mouth 2 (two) times daily.   HARVONI PO Take 1 tablet by mouth at bedtime.   insulin lispro 100 UNIT/ML injection Commonly known as:  HUMALOG Inject 20 Units into the skin 2 (two) times daily.   labetalol 200 MG tablet Commonly known as:  NORMODYNE Take 300 mg by mouth 2 (two) times daily.   nitroGLYCERIN 0.4 MG SL tablet Commonly known as:  NITROSTAT Place 0.4 mg under the tongue every 5 (five) minutes as needed for chest pain.   polyethylene glycol  packet Commonly known as:  MIRALAX / GLYCOLAX Take 17 g by mouth daily as needed.   sertraline 25 MG tablet Commonly known as:  ZOLOFT Take 25 mg by mouth every morning.   traMADol 50 MG tablet Commonly known as:  ULTRAM Take 1 tablet (50 mg total) by mouth every 6 (six) hours as needed for moderate pain.      Betha Loa Ariyon Mittleman 08/15/2016 3:37 PM

## 2016-08-17 ENCOUNTER — Telehealth: Payer: Self-pay | Admitting: Gastroenterology

## 2016-08-17 ENCOUNTER — Other Ambulatory Visit: Payer: Self-pay

## 2016-08-17 MED ORDER — PANTOPRAZOLE SODIUM 40 MG PO TBEC: 40.0000 mg | DELAYED_RELEASE_TABLET | Freq: Two times a day (BID) | ORAL | 0 refills | Status: DC

## 2016-08-17 MED ORDER — BIS SUBCIT-METRONID-TETRACYC 140-125-125 MG PO CAPS: 3.0000 | ORAL_CAPSULE | Freq: Three times a day (TID) | ORAL | 0 refills | Status: DC

## 2016-08-17 NOTE — Telephone Encounter (Signed)
See result note.  

## 2016-08-21 ENCOUNTER — Telehealth: Payer: Self-pay | Admitting: Gastroenterology

## 2016-08-21 DIAGNOSIS — D649 Anemia, unspecified: Secondary | ICD-10-CM

## 2016-08-21 NOTE — Telephone Encounter (Signed)
Patient recently discharged for gastric ulcer and anemia.  She reports dizziness that started a few days ago.  She will come for a CBC, discussed with Dr. Havery Moros, he is MD of the day.  She will proceed to the ED if her symptoms worsen or she has new symptoms. She verbalized understanding. She will come tomorrow am.

## 2016-08-22 ENCOUNTER — Other Ambulatory Visit (INDEPENDENT_AMBULATORY_CARE_PROVIDER_SITE_OTHER): Payer: BLUE CROSS/BLUE SHIELD

## 2016-08-22 DIAGNOSIS — D649 Anemia, unspecified: Secondary | ICD-10-CM | POA: Diagnosis not present

## 2016-08-22 LAB — CBC WITH DIFFERENTIAL/PLATELET
Basophils Absolute: 0.1 10*3/uL (ref 0.0–0.1)
Basophils Relative: 0.5 % (ref 0.0–3.0)
EOS PCT: 2.1 % (ref 0.0–5.0)
Eosinophils Absolute: 0.3 10*3/uL (ref 0.0–0.7)
HEMATOCRIT: 27.1 % — AB (ref 36.0–46.0)
HEMOGLOBIN: 9.2 g/dL — AB (ref 12.0–15.0)
LYMPHS ABS: 2.3 10*3/uL (ref 0.7–4.0)
LYMPHS PCT: 19.4 % (ref 12.0–46.0)
MCHC: 34.1 g/dL (ref 30.0–36.0)
MCV: 83.3 fl (ref 78.0–100.0)
MONOS PCT: 6.9 % (ref 3.0–12.0)
Monocytes Absolute: 0.8 10*3/uL (ref 0.1–1.0)
NEUTROS PCT: 71.1 % (ref 43.0–77.0)
Neutro Abs: 8.4 10*3/uL — ABNORMAL HIGH (ref 1.4–7.7)
Platelets: 273 10*3/uL (ref 150.0–400.0)
RBC: 3.25 Mil/uL — AB (ref 3.87–5.11)
RDW: 14.3 % (ref 11.5–15.5)
WBC: 11.9 10*3/uL — ABNORMAL HIGH (ref 4.0–10.5)

## 2016-09-27 ENCOUNTER — Ambulatory Visit (INDEPENDENT_AMBULATORY_CARE_PROVIDER_SITE_OTHER): Payer: BLUE CROSS/BLUE SHIELD | Admitting: Gastroenterology

## 2016-09-27 ENCOUNTER — Encounter: Payer: Self-pay | Admitting: Gastroenterology

## 2016-09-27 VITALS — BP 104/74 | HR 72 | Ht 65.0 in | Wt 237.5 lb

## 2016-09-27 DIAGNOSIS — A048 Other specified bacterial intestinal infections: Secondary | ICD-10-CM | POA: Diagnosis not present

## 2016-09-27 DIAGNOSIS — D62 Acute posthemorrhagic anemia: Secondary | ICD-10-CM

## 2016-09-27 DIAGNOSIS — K219 Gastro-esophageal reflux disease without esophagitis: Secondary | ICD-10-CM

## 2016-09-27 DIAGNOSIS — K253 Acute gastric ulcer without hemorrhage or perforation: Secondary | ICD-10-CM

## 2016-09-27 NOTE — Patient Instructions (Addendum)
Start taking a Slow Release Iron tablet (available over the counter) once daily to improve your anemia.  If you are age 55 or older, your body mass index should be between 23-30. Your Body mass index is 39.52 kg/m. If this is out of the aforementioned range listed, please consider follow up with your Primary Care Provider.  If you are age 57 or younger, your body mass index should be between 19-25. Your Body mass index is 39.52 kg/m. If this is out of the aformentioned range listed, please consider follow up with your Primary Care Provider.   You have been scheduled for an endoscopy. Please follow written instructions given to you at your visit today. If you use inhalers (even only as needed), please bring them with you on the day of your procedure. Your physician has requested that you go to www.startemmi.com and enter the access code given to you at your visit today. This web site gives a general overview about your procedure. However, you should still follow specific instructions given to you by our office regarding your preparation for the procedure.  Thank you for choosing [facility]  Dr Wilfrid Lund III

## 2016-09-27 NOTE — Progress Notes (Signed)
     [name] [name]  Chief Complaint: Gastric ulcer  Subjective  History:  This is a 56 year old woman I last saw in nearly a year ago for dysphagia. She had previously seen Dr. Deatra Ina, who had done several upper endoscopies with Botox injection. 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 When I last saw her, I did an esophageal manometry that was normal. I therefore felt that Botox was not indicated. It seems that her dysphagia has somehow improved since then. She was admitted to the hospital early last month with melena and acute blood loss anemia. She had been taking high doses of NSAIDs regularly for chronic pain. She still has issues of chronic musculoskeletal as well as bilateral flank pain. Dr. Ardis Hughs performed an upper endoscopy that revealed a small clean-based gastric ulcer, and biopsies were positive for H. pylori. He 14 days of Pylera and has continued her once daily PPI. She also recently finished Harvoni for chronic hepatitis C through a local hepatology clinic. She says she was cleared of virus at the end of treatment, and that they will check again in October.  Since hospital discharge, [name] [name] has been avoiding NSAIDs as much as possible and has not seen any more black tarry stool. Is not seem that she has been on iron tablets, so I recommended she start that.  ROS: Cardiovascular:  no chest pain Respiratory: no dyspnea Remainder systems are negative except as above The patient's Past Medical, Family and Social History were reviewed and are on file in the EMR.  Objective:  Med list reviewed  Vital signs in last 24 hrs: Vitals:   09/27/16 1142  BP: 104/74  Pulse: 72    Physical Exam    HEENT: sclera anicteric, oral mucosa moist without lesions  Neck: supple, no thyromegaly, JVD or lymphadenopathy  Cardiac: RRR without murmurs, S1S2 heard, no peripheral edema  Pulm: clear to auscultation bilaterally, normal RR  and effort noted  Abdomen: soft, Obese, no tenderness, with active bowel sounds. No guarding or palpable hepatosplenomegaly.  Skin; warm and dry, no jaundice or rash  Recent Labs: CBC Latest Ref Rng & Units 08/22/2016 08/15/2016 08/14/2016  WBC 4.0 - 10.5 K/uL 11.9(H) 9.3 10.5  Hemoglobin 12.0 - 15.0 g/dL 9.2(L) 8.7(L) 8.8(L)  Hematocrit 36.0 - 46.0 % 27.1(L) 25.7(L) 25.0(L)  Platelets 150.0 - 400.0 K/uL 273.0 196 208    5 see positive for H. pylori  @ASSESSMENTPLANBEGIN @ Assessment: Encounter Diagnoses  Name Primary?  . Gastroesophageal reflux disease without esophagitis Yes  . Acute gastric ulcer without hemorrhage or perforation   . Acute blood loss anemia   . H. pylori infection   I suspect she had bleeding and ulcer largely on the basis of NSAIDs, but we must make sure the ulcer is cleared any H. pylori eradicated.  Plan: EGD with gastric biopsies  The benefits and risks of the planned procedure were described in detail with the patient or (when appropriate) their health care proxy.  Risks were outlined as including, but not limited to, bleeding, infection, perforation, adverse medication reaction leading to cardiac or pulmonary decompensation, or pancreatitis (if ERCP).  The limitation of incomplete mucosal visualization was also discussed.  No guarantees or warranties were given.  [name] [name]

## 2016-10-03 ENCOUNTER — Encounter: Payer: Self-pay | Admitting: Gastroenterology

## 2016-10-03 ENCOUNTER — Ambulatory Visit (AMBULATORY_SURGERY_CENTER): Payer: BLUE CROSS/BLUE SHIELD | Admitting: Gastroenterology

## 2016-10-03 VITALS — BP 139/65 | HR 70 | Temp 96.2°F | Resp 18 | Ht 65.0 in | Wt 237.0 lb

## 2016-10-03 DIAGNOSIS — K254 Chronic or unspecified gastric ulcer with hemorrhage: Secondary | ICD-10-CM | POA: Diagnosis present

## 2016-10-03 DIAGNOSIS — K2950 Unspecified chronic gastritis without bleeding: Secondary | ICD-10-CM | POA: Diagnosis not present

## 2016-10-03 DIAGNOSIS — B9681 Helicobacter pylori [H. pylori] as the cause of diseases classified elsewhere: Secondary | ICD-10-CM | POA: Diagnosis not present

## 2016-10-03 DIAGNOSIS — A048 Other specified bacterial intestinal infections: Secondary | ICD-10-CM

## 2016-10-03 DIAGNOSIS — K219 Gastro-esophageal reflux disease without esophagitis: Secondary | ICD-10-CM

## 2016-10-03 MED ORDER — SODIUM CHLORIDE 0.9 % IV SOLN: 500.0000 mL | INTRAVENOUS | Status: DC

## 2016-10-03 NOTE — Op Note (Signed)
[name] Procedure Date: 10/03/2016 10:16 AM MRN: [id number] Endoscopist: Mallie Mussel L. Loletha Carrow , MD Age: 56 Referring MD:  Date of Birth: Apr 11, 1960 Gender: Female Account #: [id number] Procedure:                Upper GI endoscopy Indications:              Previously treated for Helicobacter pylori,                            Follow-up of chronic gastric ulcer Medicines:                Monitored Anesthesia Care Procedure:                Pre-Anesthesia Assessment:                           - Prior to the procedure, a History and Physical                            was performed, and patient medications and                            allergies were reviewed. The patient's tolerance of                            previous anesthesia was also reviewed. The risks                            and benefits of the procedure and the sedation                            options and risks were discussed with the patient.                            All questions were answered, and informed consent                            was obtained. Prior Anticoagulants: The patient has                            taken no previous anticoagulant or antiplatelet                            agents. ASA Grade Assessment: III - A patient with                            severe systemic disease. After reviewing the risks                            and benefits, the patient was deemed in                            satisfactory condition to undergo the procedure.  After obtaining informed consent, the endoscope was                            passed under direct vision. Throughout the                            procedure, the patient's blood pressure, pulse, and                            oxygen saturations were monitored continuously. The                            Endoscope was introduced through the mouth, and                            advanced to the second  part of duodenum. The upper                            GI endoscopy was accomplished without difficulty.                            The patient tolerated the procedure well. Scope In: Scope Out: Findings:                 The larynx was normal.                           The esophagus was normal.                           A few erosions were found in the gastric antrum.                            Biopsies were taken with a cold forceps for                            histology. ([facility] protocol).                           The cardia and gastric fundus were normal on                            retroflexion.                           The examined duodenum was normal. Complications:            No immediate complications. Estimated Blood Loss:     Estimated blood loss was minimal. Impression:               - Normal larynx.                           - Normal esophagus.                           - Gastric erosions. Biopsied.                           -  Normal examined duodenum. Recommendation:           - Patient has a contact number available for                            emergencies. The signs and symptoms of potential                            delayed complications were discussed with the                            patient. Return to normal activities tomorrow.                            Written discharge instructions were provided to the                            patient.                           - Resume previous diet.                           - Continue present medications.                           - Avoid aspirin, ibuprofen, naproxen, or other                            non-steroidal anti-inflammatory drugs.                           - Await pathology results to confirm H. pylori                            eradication. Kensie Susman L. Loletha Carrow, MD 10/03/2016 10:34:08 AM This report has been signed electronically.

## 2016-10-03 NOTE — Patient Instructions (Signed)
YOU HAD AN ENDOSCOPIC PROCEDURE TODAY AT [facility]:   Refer to the procedure report that was given to you for any specific questions about what was found during the examination.  If the procedure report does not answer your questions, please call your gastroenterologist to clarify.  If you requested that your care partner not be given the details of your procedure findings, then the procedure report has been included in a sealed envelope for you to review at your convenience later.  YOU SHOULD EXPECT: Some feelings of bloating in the abdomen. Passage of more gas than usual.  Walking can help get rid of the air that was put into your GI tract during the procedure and reduce the bloating. If you had a lower endoscopy (such as a colonoscopy or flexible sigmoidoscopy) you may notice spotting of blood in your stool or on the toilet paper. If you underwent a bowel prep for your procedure, you may not have a normal bowel movement for a few days.  Please Note:  You might notice some irritation and congestion in your nose or some drainage.  This is from the oxygen used during your procedure.  There is no need for concern and it should clear up in a day or so.  SYMPTOMS TO REPORT IMMEDIATELY:   Following upper endoscopy (EGD)  Vomiting of blood or coffee ground material  New chest pain or pain under the shoulder blades  Painful or persistently difficult swallowing  New shortness of breath  Fever of 100F or higher  Black, tarry-looking stools  For urgent or emergent issues, a gastroenterologist can be reached at any hour by calling [phone].   DIET:  We do recommend a small meal at first, but then you may proceed to your regular diet.  Drink plenty of fluids but you should avoid alcoholic beverages for 24 hours.  MEDICATIONS: Continue present medications. Avoid Aspirin, Ibuprofen, Naproxen, or other non-steroidal anti-inflammatory drugs.  Await pathology results to confirm H.  Pylori eradication.  ACTIVITY:  You should plan to take it easy for the rest of today and you should NOT DRIVE or use heavy machinery until tomorrow (because of the sedation medicines used during the test).    FOLLOW UP: Our staff will call the number listed on your records the next business day following your procedure to check on you and address any questions or concerns that you may have regarding the information given to you following your procedure. If we do not reach you, we will leave a message.  However, if you are feeling well and you are not experiencing any problems, there is no need to return our call.  We will assume that you have returned to your regular daily activities without incident.  If any biopsies were taken you will be contacted by phone or by letter within the next 1-3 weeks.  Please call us at [phone] if you have not heard about the biopsies in 3 weeks.   Thank you for allowing [place] to provide for your healthcare needs today.   SIGNATURES/CONFIDENTIALITY: You and/or your care partner have signed paperwork which will be entered into your electronic medical record.  These signatures attest to the fact that that the information above on your After Visit Summary has been reviewed and is understood.  Full responsibility of the confidentiality of this discharge information lies with you and/or your care-partner.

## 2016-10-03 NOTE — Progress Notes (Signed)
A and O x3. Report to RN. Tolerated MAC anesthesia well.Teeth unchanged after procedure.

## 2016-10-04 ENCOUNTER — Telehealth: Payer: Self-pay | Admitting: *Deleted

## 2016-10-04 NOTE — Telephone Encounter (Signed)
  Follow up Call-  Call back number 10/03/2016  Post procedure Call Back phone  # [phone]  Permission to leave phone message Yes  Some recent data might be hidden     Patient questions:  Do you have a fever, pain , or abdominal swelling? No. Pain Score  0 *  Have you tolerated food without any problems? Yes.    Have you been able to return to your normal activities? Yes.    Do you have any questions about your discharge instructions: Diet   No. Medications  No. Follow up visit  No.  Do you have questions or concerns about your Care? No.  Actions: * If pain score is 4 or above: No action needed, pain <4.

## 2016-10-11 ENCOUNTER — Encounter: Payer: Self-pay | Admitting: Gastroenterology

## 2016-10-11 ENCOUNTER — Other Ambulatory Visit: Payer: Self-pay

## 2016-10-11 DIAGNOSIS — A048 Other specified bacterial intestinal infections: Secondary | ICD-10-CM

## 2016-10-11 MED ORDER — OMEPRAZOLE 20 MG PO CPDR: 20.0000 mg | DELAYED_RELEASE_CAPSULE | Freq: Two times a day (BID) | ORAL | 0 refills | Status: DC

## 2016-10-11 MED ORDER — LEVOFLOXACIN 500 MG PO TABS: 500.0000 mg | ORAL_TABLET | Freq: Every day | ORAL | 0 refills | Status: DC

## 2016-10-11 MED ORDER — AMOXICILLIN 500 MG PO TABS: 1000.0000 mg | ORAL_TABLET | Freq: Two times a day (BID) | ORAL | 0 refills | Status: DC

## 2016-10-12 ENCOUNTER — Other Ambulatory Visit: Payer: Self-pay

## 2016-10-12 ENCOUNTER — Telehealth: Payer: Self-pay

## 2016-10-12 NOTE — Telephone Encounter (Signed)
-----   Message from [place], MD sent at 10/11/2016  8:40 AM EDT ----- Almyra Free,    Stomach biopsy shows that she still has the h pylori bacteria.  Since she told me she took the entire course of Pylera antibiotics prescribed after the hospital stay.  Sometimes the bacteria can be resistant to certain antibiotics. Even though her ulcer has healed, we  need to try and eradicate the bacteria with a different antibiotic regimen. Plan:  Omeprazole 20 mg , one tablet twice daily x 14 days.  Disp #28, RF zero      Amoxicillin 500 mg, two tablet twice daily x 14 days  Disp# 56, RF zero Levofloxacin 500 mg, one tablet daily x 14 days  Disp# 14, RF zero    Two weeks after finishing that treatment (and while off omeprazole or any other antacid med for at least 5 days), she needs a urea breath test to confirm eradication.   Kristen,    No need for path letter

## 2016-10-12 NOTE — Telephone Encounter (Signed)
Left message for patient to call back on 10/11/16. I have not heard back from her, mailed letter with results and location of [facility] lab.

## 2016-10-17 DIAGNOSIS — N183 Chronic kidney disease, stage 3 unspecified: Secondary | ICD-10-CM | POA: Insufficient documentation

## 2016-10-17 DIAGNOSIS — R3129 Other microscopic hematuria: Secondary | ICD-10-CM | POA: Insufficient documentation

## 2016-10-17 DIAGNOSIS — E875 Hyperkalemia: Secondary | ICD-10-CM | POA: Insufficient documentation

## 2016-10-17 DIAGNOSIS — R809 Proteinuria, unspecified: Secondary | ICD-10-CM | POA: Insufficient documentation

## 2016-10-17 HISTORY — DX: Chronic kidney disease, stage 3 unspecified: N18.30

## 2016-10-17 HISTORY — DX: Other microscopic hematuria: R31.29

## 2016-10-23 ENCOUNTER — Encounter: Payer: Self-pay | Admitting: Podiatry

## 2016-10-23 ENCOUNTER — Ambulatory Visit (INDEPENDENT_AMBULATORY_CARE_PROVIDER_SITE_OTHER): Payer: BLUE CROSS/BLUE SHIELD

## 2016-10-23 ENCOUNTER — Ambulatory Visit (INDEPENDENT_AMBULATORY_CARE_PROVIDER_SITE_OTHER): Payer: BLUE CROSS/BLUE SHIELD | Admitting: Podiatry

## 2016-10-23 DIAGNOSIS — M722 Plantar fascial fibromatosis: Secondary | ICD-10-CM

## 2016-10-23 NOTE — Patient Instructions (Signed)

## 2016-10-24 NOTE — Progress Notes (Signed)
She presents today as an uncontrolled diabetic blood sugars recently in the 500 range. Complaining of pain to the left heel mornings are particularly bad. States is bothering her now for about a month. She had surgery on the other foot which resolved her problem.  Objective: Vital signs are stable alert and oriented pulses remain palpable. Neurologic sensorium is diminished per Semmes-Weinstein monofilament. She has pain on palpation medially and tubercle of the left heel. Radiographs taken today demonstrate moderate to severe soft tissue edema at the insertion site of the plantar fascia on the calcaneus. No fractures identified.  Assessment: Pain in limb secondary to plantar fasciitis left.  Plan: I injected her left heel today placed her in plantar fascia brace and a night splint. 10 mg of Kenalog was utilized with local anesthetic. This was considerably painful for her. I did not start her on any medication by mouth because of her diabetes and her cardiac history. We discussed appropriate shoe gear stretching exercises ice therapy and shoe modification she was provided with oral and written home-going instructions upon leaving.

## 2016-11-09 ENCOUNTER — Other Ambulatory Visit: Payer: BLUE CROSS/BLUE SHIELD

## 2016-11-13 ENCOUNTER — Telehealth: Payer: Self-pay | Admitting: Gastroenterology

## 2016-11-13 NOTE — Telephone Encounter (Signed)
Lab report show that the antibiotics got rid of the stomach bacteria.

## 2016-11-14 NOTE — Telephone Encounter (Signed)
Patient advised of test results.

## 2016-11-15 DIAGNOSIS — D500 Iron deficiency anemia secondary to blood loss (chronic): Secondary | ICD-10-CM | POA: Insufficient documentation

## 2016-11-15 DIAGNOSIS — E559 Vitamin D deficiency, unspecified: Secondary | ICD-10-CM | POA: Insufficient documentation

## 2016-11-15 DIAGNOSIS — N179 Acute kidney failure, unspecified: Secondary | ICD-10-CM

## 2016-11-15 HISTORY — DX: Acute kidney failure, unspecified: N17.9

## 2016-11-20 ENCOUNTER — Ambulatory Visit (INDEPENDENT_AMBULATORY_CARE_PROVIDER_SITE_OTHER): Payer: BLUE CROSS/BLUE SHIELD | Admitting: Podiatry

## 2016-11-20 ENCOUNTER — Encounter: Payer: Self-pay | Admitting: Podiatry

## 2016-11-20 DIAGNOSIS — M722 Plantar fascial fibromatosis: Secondary | ICD-10-CM

## 2016-11-21 NOTE — Progress Notes (Signed)
She presents today for follow-up of her plantar fasciitis to her left heel. [name] [name] says that is feeling pretty good.  Objective: Vital signs are stable alert and oriented 3 she has pain on palpation medially and tubercle of the left heel. No calf pain left. Pulses are strongly palpable.  Assessment: Resolving plantar fasciitis 90% improved.  Plan: Reinjected the left heel today Kenalog and local anesthetic to the point of maximal tenderness left. She tolerated procedure well without complications. Follow-up with her in 1 month. Continue all other conservative therapies including anti-inflammatories or fascial brace and appropriate shoe gear.

## 2017-01-01 ENCOUNTER — Encounter: Payer: Self-pay | Admitting: Podiatry

## 2017-01-01 ENCOUNTER — Ambulatory Visit (INDEPENDENT_AMBULATORY_CARE_PROVIDER_SITE_OTHER): Payer: BLUE CROSS/BLUE SHIELD | Admitting: Podiatry

## 2017-01-01 DIAGNOSIS — M722 Plantar fascial fibromatosis: Secondary | ICD-10-CM

## 2017-01-01 NOTE — Progress Notes (Signed)
She presents today for follow-up of her plantar fasciitis. She states this seems to be doing a lot better. She states that she has to have surgery she would like to have the week of Christmas and the week after.  Objective: There is minimal pain on palpation medial calcaneal tubercle of the left tibial pulses are palpable with no calf pain.  Assessment: Resolving plantar fasciitis left.  Plan: Encouraged her continue all conservative therapies and will follow up with her in 2 weeks for consult if needed.

## 2017-01-15 ENCOUNTER — Ambulatory Visit: Payer: BLUE CROSS/BLUE SHIELD | Admitting: Podiatry

## 2017-03-27 DIAGNOSIS — R079 Chest pain, unspecified: Secondary | ICD-10-CM | POA: Diagnosis not present

## 2017-04-26 ENCOUNTER — Telehealth: Payer: Self-pay | Admitting: Podiatry

## 2017-04-26 NOTE — Telephone Encounter (Signed)
Pt states her sugar is at a 7 per her doctor, and she would like to know how long she will be out of work. I told pt she would be in a boot at least 4-6 weeks, but if she had a sit down job she would be able to go back to work after the 1st POV if Dr. Marta Antu. Pt states she would like to come in to schedule and discuss.

## 2017-04-26 NOTE — Telephone Encounter (Signed)
My feet are giving me a fit. I was wondering if I did decide to have surgery how long would I have to be off my feet? They are hurting me really bad. Could someone call me back and advise me on what to do? My number is [phone]. Thanks. Bye.

## 2017-05-02 ENCOUNTER — Telehealth: Payer: Self-pay | Admitting: *Deleted

## 2017-05-02 NOTE — Telephone Encounter (Signed)
Unable to leave message pt's mailbox is full.

## 2017-05-02 NOTE — Telephone Encounter (Signed)
Pt called states her feet are killing her can the doctor give her a shot.

## 2017-05-02 NOTE — Telephone Encounter (Signed)
Unable to leave message on pt's mobile mailbox is full.

## 2017-05-07 ENCOUNTER — Ambulatory Visit: Payer: BLUE CROSS/BLUE SHIELD | Admitting: Podiatry

## 2017-05-14 ENCOUNTER — Encounter: Payer: Self-pay | Admitting: Podiatry

## 2017-05-14 ENCOUNTER — Ambulatory Visit: Payer: BLUE CROSS/BLUE SHIELD | Admitting: Podiatry

## 2017-05-14 DIAGNOSIS — M722 Plantar fascial fibromatosis: Secondary | ICD-10-CM

## 2017-05-14 NOTE — Progress Notes (Signed)
She presents today for a follow-up of her plantar fasciitis.  States that they both of started hurting me again in the back of the heels are really starting to bother me.  Objective: Vital signs are stable she is alert and oriented x3.  Pulses are palpable.  She has pain to palpation medial calcaneal tubercle bilateral.  Mild tenderness on palpation of the exterior aspect of the Achilles as it inserts on the heel.  Assessment: Chronic intractable plantar fasciitis.  Plan: Discussed etiology pathology conservative or surgical therapies after sterile Betadine skin prep I injected 20 mg Kenalog 5 mg Marcaine to the medial aspect of bilateral heel.  She tolerated procedure well without complications.  I like to see if we can get her into a set of orthotics.  We will try to preserve those for her.

## 2017-05-15 ENCOUNTER — Telehealth: Payer: Self-pay | Admitting: *Deleted

## 2017-05-15 ENCOUNTER — Telehealth: Payer: Self-pay | Admitting: Podiatry

## 2017-05-15 NOTE — Telephone Encounter (Signed)
Pt states she received 2 injections in her feet yesterday and her feet are killing her.

## 2017-05-15 NOTE — Telephone Encounter (Signed)
Left message on pt's alternate phone Mr. [name] [name] informing that occasionally steroid injections will cause a flare of symptoms, to ice the areas 3-4 times a day for 15 minutes/session protecting the skin from the cold with a towel and if able take OTC ibuprofen as the package instructs, and call with concerns.

## 2017-05-15 NOTE — Telephone Encounter (Signed)
Completed on another Telephone Call message.

## 2017-05-15 NOTE — Telephone Encounter (Signed)
I saw the doctor yesterday and I got an injection in both of my feet. My feet are still hurting really bad. Can you please tell him the shot didn't help evidently or do I need more time? It's hurting bad. My number is [phone]. Thank you.

## 2017-05-15 NOTE — Telephone Encounter (Signed)
Unable to leave a message on the home phone mailbox was full.

## 2017-05-16 ENCOUNTER — Telehealth: Payer: Self-pay | Admitting: Podiatry

## 2017-05-16 NOTE — Telephone Encounter (Signed)
Unable to leave a message on pt's emergency contact Adela Lank phone.

## 2017-05-16 NOTE — Telephone Encounter (Signed)
Unable to leave a message on home phone mail box is full.

## 2017-05-16 NOTE — Telephone Encounter (Signed)
I called yesterday and left a message around 12 noon. I saw Dr. Milinda Pointer on Tuesday, 02 April and he gave me an injection in both feet. The pain has not gotten better and it doesn't feel like the shots are working. So can somebody please call me back or even the doctor call me back? Thank you. Bye.

## 2017-05-21 ENCOUNTER — Encounter: Payer: Self-pay | Admitting: Podiatry

## 2017-05-21 ENCOUNTER — Ambulatory Visit (INDEPENDENT_AMBULATORY_CARE_PROVIDER_SITE_OTHER): Payer: BLUE CROSS/BLUE SHIELD | Admitting: Podiatry

## 2017-05-21 ENCOUNTER — Other Ambulatory Visit: Payer: BLUE CROSS/BLUE SHIELD | Admitting: Orthotics

## 2017-05-21 DIAGNOSIS — M722 Plantar fascial fibromatosis: Secondary | ICD-10-CM

## 2017-05-21 NOTE — Progress Notes (Signed)
She presents today for follow-up of her bilateral plantar fasciitis.  She states that I think the injections made my heels hurt worse.  Objective: Vital signs are stable she is alert and oriented x3.  Pulses are palpable.  She has pain on palpation medial calcaneal tubercles bilateral.  Assessment: Plantar fasciitis bilateral.  Plan: She was scanned for orthotics today and I will follow-up with her in the near future to discuss orthotics and get an injection.

## 2017-06-25 ENCOUNTER — Encounter: Payer: Self-pay | Admitting: Podiatry

## 2017-06-25 ENCOUNTER — Ambulatory Visit (INDEPENDENT_AMBULATORY_CARE_PROVIDER_SITE_OTHER): Payer: BLUE CROSS/BLUE SHIELD | Admitting: Podiatry

## 2017-06-25 ENCOUNTER — Other Ambulatory Visit: Payer: Self-pay

## 2017-06-25 DIAGNOSIS — M722 Plantar fascial fibromatosis: Secondary | ICD-10-CM

## 2017-06-25 NOTE — Progress Notes (Signed)
Dispensed patient's orthotics with oral and written instructions for wearing. Patient will follow up with Dr. Milinda Pointer in 6 weeks for an orthotic check.

## 2017-06-25 NOTE — Patient Instructions (Signed)

## 2017-08-06 ENCOUNTER — Ambulatory Visit: Payer: BLUE CROSS/BLUE SHIELD | Admitting: Podiatry

## 2017-10-01 ENCOUNTER — Encounter: Payer: Self-pay | Admitting: Podiatry

## 2017-10-01 ENCOUNTER — Ambulatory Visit (INDEPENDENT_AMBULATORY_CARE_PROVIDER_SITE_OTHER): Payer: BLUE CROSS/BLUE SHIELD | Admitting: Podiatry

## 2017-10-01 DIAGNOSIS — M722 Plantar fascial fibromatosis: Secondary | ICD-10-CM | POA: Diagnosis not present

## 2017-10-01 DIAGNOSIS — M778 Other enthesopathies, not elsewhere classified: Secondary | ICD-10-CM

## 2017-10-01 DIAGNOSIS — M779 Enthesopathy, unspecified: Secondary | ICD-10-CM

## 2017-10-01 DIAGNOSIS — E1142 Type 2 diabetes mellitus with diabetic polyneuropathy: Secondary | ICD-10-CM

## 2017-10-01 MED ORDER — GABAPENTIN 100 MG PO CAPS: 100.0000 mg | ORAL_CAPSULE | Freq: Three times a day (TID) | ORAL | 11 refills | Status: DC

## 2017-10-02 NOTE — Progress Notes (Signed)
She presents today for follow-up of her plantar fasciitis last time she was in was May 2019.  She states that now the ankle area is starting to hurt as she points to the sinus tarsi area.  Objective: Vital signs are stable she is alert and oriented x3 plantar fasciitis seems to be resolving nicely that she does have pain on end range of motion of the subtalar joint bilaterally.  She also has burning and tingling in her feet which are not demonstrated by loss of sensation at this point per [employer] monofilament.  Assessment: Resolving plantar fasciitis subtalar joint capsulitis.  Diabetic polyneuropathy early on.  Plan: Discussed etiology pathology conservative surgical therapies at this point I highly recommended intra-articular injections of the subtalar joint which was performed after sterile Betadine skin prep 20 mg Kenalog 5 mg Marcaine was injected into the sinus tarsi bilaterally.  Tolerated procedure well without complications we will follow-up with her in approximately 6 weeks.  Started her on gabapentin 100 mg.

## 2017-12-10 ENCOUNTER — Ambulatory Visit: Payer: BLUE CROSS/BLUE SHIELD | Admitting: Podiatry

## 2018-06-05 ENCOUNTER — Telehealth: Payer: Self-pay | Admitting: Cardiology

## 2018-06-05 NOTE — Telephone Encounter (Signed)
Virtual Visit Pre-Appointment Phone Call  "(Name), I am calling you today to discuss your upcoming appointment. We are currently trying to limit exposure to the virus that causes COVID-19 by seeing patients at home rather than in the office."  1. "What is the BEST phone number to call the day of the visit?" - include this in appointment notes  2. Do you have or have access to (through a family member/friend) a smartphone with video capability that we can use for your visit?" a. If yes - list this number in appt notes as cell (if different from BEST phone #) and list the appointment type as a VIDEO visit in appointment notes b. If no - list the appointment type as a PHONE visit in appointment notes  3. Confirm consent - "In the setting of the current Covid19 crisis, you are scheduled for a (phone or video) visit with your provider on (date) at (time).  Just as we do with many in-office visits, in order for you to participate in this visit, we must obtain consent.  If you'd like, I can send this to your mychart (if signed up) or email for you to review.  Otherwise, I can obtain your verbal consent now.  All virtual visits are billed to your insurance company just like a normal visit would be.  By agreeing to a virtual visit, we'd like you to understand that the technology does not allow for your provider to perform an examination, and thus may limit your provider's ability to fully assess your condition. If your provider identifies any concerns that need to be evaluated in person, we will make arrangements to do so.  Finally, though the technology is pretty good, we cannot assure that it will always work on either your or our end, and in the setting of a video visit, we may have to convert it to a phone-only visit.  In either situation, we cannot ensure that we have a secure connection.  Are you willing to proceed?" STAFF: Did the patient verbally acknowledge consent to telehealth visit? Document  YES/NO here: Yes  4. Advise patient to be prepared - "Two hours prior to your appointment, go ahead and check your blood pressure, pulse, oxygen saturation, and your weight (if you have the equipment to check those) and write them all down. When your visit starts, your provider will ask you for this information. If you have an Apple Watch or Kardia device, please plan to have heart rate information ready on the day of your appointment. Please have a pen and paper handy nearby the day of the visit as well."  5. Give patient instructions for MyChart download to smartphone OR [email or web address] as below if video visit (depending on what platform provider is using)  6. Inform patient they will receive a phone call 15 minutes prior to their appointment time (may be from unknown caller ID) so they should be prepared to answer      [place]   I hereby voluntarily request, consent and authorize [facility] and its employed or contracted physicians, physician assistants, nurse practitioners or other licensed health care professionals (the Practitioner), to provide me with telemedicine health care services (the Services") as deemed necessary by the treating Practitioner. I acknowledge and consent to receive the Services by the Practitioner via telemedicine. I understand that the telemedicine visit will involve communicating with the Practitioner through live audiovisual communication technology and the disclosure of certain medical information by  electronic transmission. I acknowledge that I have been given the opportunity to request an in-person assessment or other available alternative prior to the telemedicine visit and am voluntarily participating in the telemedicine visit.  I understand that I have the right to withhold or withdraw my consent to the use of telemedicine in the course of my care at any time, without affecting my right to future care or treatment, and that  the Practitioner or I may terminate the telemedicine visit at any time. I understand that I have the right to inspect all information obtained and/or recorded in the course of the telemedicine visit and may receive copies of available information for a reasonable fee.  I understand that some of the potential risks of receiving the Services via telemedicine include:   Delay or interruption in medical evaluation due to technological equipment failure or disruption;  Information transmitted may not be sufficient (e.g. poor resolution of images) to allow for appropriate medical decision making by the Practitioner; and/or   In rare instances, security protocols could fail, causing a breach of personal health information.  Furthermore, I acknowledge that it is my responsibility to provide information about my medical history, conditions and care that is complete and accurate to the best of my ability. I acknowledge that Practitioner's advice, recommendations, and/or decision may be based on factors not within their control, such as incomplete or inaccurate data provided by me or distortions of diagnostic images or specimens that may result from electronic transmissions. I understand that the practice of medicine is not an exact science and that Practitioner makes no warranties or guarantees regarding treatment outcomes. I acknowledge that I will receive a copy of this consent concurrently upon execution via email to the email address I last provided but may also request a printed copy by calling the office of [facility].    I understand that my insurance will be billed for this visit.   I have read or had this consent read to me.  I understand the contents of this consent, which adequately explains the benefits and risks of the Services being provided via telemedicine.   I have been provided ample opportunity to ask questions regarding this consent and the Services and have had my questions answered to  my satisfaction.  I give my informed consent for the services to be provided through the use of telemedicine in my medical care  By participating in this telemedicine visit I agree to the above.

## 2018-06-09 ENCOUNTER — Telehealth: Payer: Self-pay

## 2018-06-09 ENCOUNTER — Encounter: Payer: Self-pay | Admitting: Cardiology

## 2018-06-09 ENCOUNTER — Telehealth (INDEPENDENT_AMBULATORY_CARE_PROVIDER_SITE_OTHER): Payer: Medicaid Other | Admitting: Cardiology

## 2018-06-09 ENCOUNTER — Other Ambulatory Visit: Payer: Self-pay

## 2018-06-09 VITALS — BP 153/79 | HR 68 | Ht 65.0 in | Wt 270.0 lb

## 2018-06-09 DIAGNOSIS — I10 Essential (primary) hypertension: Secondary | ICD-10-CM

## 2018-06-09 DIAGNOSIS — R0789 Other chest pain: Secondary | ICD-10-CM

## 2018-06-09 DIAGNOSIS — E1141 Type 2 diabetes mellitus with diabetic mononeuropathy: Secondary | ICD-10-CM

## 2018-06-09 DIAGNOSIS — E7800 Pure hypercholesterolemia, unspecified: Secondary | ICD-10-CM

## 2018-06-09 DIAGNOSIS — E1149 Type 2 diabetes mellitus with other diabetic neurological complication: Secondary | ICD-10-CM

## 2018-06-09 MED ORDER — CHLORTHALIDONE 25 MG PO TABS: 25.0000 mg | ORAL_TABLET | Freq: Every day | ORAL | 6 refills | Status: DC

## 2018-06-09 NOTE — Progress Notes (Signed)
Virtual Visit via Video Note   This visit type was conducted due to national recommendations for restrictions regarding the COVID-19 Pandemic (e.g. social distancing) in an effort to limit this patient's exposure and mitigate transmission in our community.  Due to her co-morbid illnesses, this patient is at least at moderate risk for complications without adequate follow up.  This format is felt to be most appropriate for this patient at this time.  All issues noted in this document were discussed and addressed.  A limited physical exam was performed with this format.  Please refer to the patient's chart for her consent to telehealth for [facility].   Evaluation Performed:  Follow-up visit  Date:  06/09/2018   ID:  FRANKEE GRITZ, DOB 1960/02/13, MRN [id number]  Patient Location: Home Provider Location: Home  PCP:  [name], MD  Cardiologist:  No primary care provider on file.  Electrophysiologist:  None   Chief Complaint: Uncontrolled hypertension  History of Present Illness:    [name] [name] is a 58 y.o. female with past medical history of essential hypertension, dyslipidemia, diabetes mellitus and morbid obesity.  She has been referred for uncontrolled hypertension.  She denies any chest pain orthopnea or PND.  Sometimes when she exerts herself she has some chest tightness.  No radiation to the neck or to the arms.  At the time of my evaluation, the patient is alert awake oriented and in no distress.  She appears to be fairly noncompliant with medical advice.  The patient does not have symptoms concerning for COVID-19 infection (fever, chills, cough, or new shortness of breath).    Past Medical History:  Diagnosis Date  . Anemia   . Anxiety   . Arthritis    "hips" (08/28/2013)  . Chronic lower back pain    10-27-13 not a problem at this time  . Depression   . Esophageal spasm   . Frequent UTI   . GERD (gastroesophageal reflux disease)   . Hepatitis C    genotype 1B   . High cholesterol   . Hypertension   . Migraine    "monthly" (08/28/2013)  . Pneumonia    "several times"  . Type II diabetes mellitus ([place])   . Ulcer    hx gastric ulcers   Past Surgical History:  Procedure Laterality Date  . ABDOMINAL HYSTERECTOMY    . APPENDECTOMY    . BALLOON DILATION N/A 12/29/2012   Procedure: BALLOON DILATION;  Surgeon: Inda Castle, MD;  Location: [facility];  Service: Endoscopy;  Laterality: N/A;  . BALLOON DILATION N/A 06/25/2013   Procedure: BALLOON DILATION;  Surgeon: Inda Castle, MD;  Location: [facility];  Service: Endoscopy;  Laterality: N/A;  . BOTOX INJECTION  12/29/2012   Procedure: BOTOX INJECTION;  Surgeon: Inda Castle, MD;  Location: [facility];  Service: Endoscopy;;  . BOTOX INJECTION N/A 06/25/2013   Procedure: BOTOX INJECTION;  Surgeon: Inda Castle, MD;  Location: [facility];  Service: Endoscopy;  Laterality: N/A;  . CARDIAC CATHETERIZATION  X 2   /notes 08/28/2013  . CARPAL TUNNEL RELEASE Right   . CHOLECYSTECTOMY    . COLONOSCOPY  04/10/2012   Normal   . ESOPHAGEAL MANOMETRY N/A 12/26/2015   Procedure: ESOPHAGEAL MANOMETRY (EM);  Surgeon: Doran Stabler, MD;  Location: [facility];  Service: Gastroenterology;  Laterality: N/A;  . ESOPHAGOGASTRODUODENOSCOPY N/A 12/29/2012   Procedure: ESOPHAGOGASTRODUODENOSCOPY (EGD);  Surgeon: Inda Castle, MD;  Location: [facility];  Service:  Endoscopy;  Laterality: N/A;  . ESOPHAGOGASTRODUODENOSCOPY N/A 06/25/2013   Procedure: ESOPHAGOGASTRODUODENOSCOPY (EGD);  Surgeon: Inda Castle, MD;  Location: [facility];  Service: Endoscopy;  Laterality: N/A;  . ESOPHAGOGASTRODUODENOSCOPY N/A 11/03/2013   Procedure: ESOPHAGOGASTRODUODENOSCOPY (EGD);  Surgeon: Inda Castle, MD;  Location: [facility];  Service: Endoscopy;  Laterality: N/A;  Botox - ballon dilation  . ESOPHAGOGASTRODUODENOSCOPY N/A 08/14/2016   Procedure: ESOPHAGOGASTRODUODENOSCOPY (EGD);  Surgeon: Milus Banister, MD;  Location[facility];  Service: Endoscopy;  Laterality: N/A;  . FOOT FRACTURE SURGERY Right   . HEEL SPUR EXCISION Right   . TONSILLECTOMY    . TUBAL LIGATION       Current Meds  Medication Sig  . acetaminophen (TYLENOL) 500 MG tablet Take 1,000 mg by mouth every 4 (four) hours as needed.   Marland Kitchen albuterol (PROAIR HFA) 108 (90 Base) MCG/ACT inhaler proair hfa 108 (90 base) mcg/act aers  . cloNIDine (CATAPRES) 0.1 MG tablet Take 0.1 mg by mouth 2 (two) times daily.   . cyclobenzaprine (FLEXERIL) 5 MG tablet Take 5 mg by mouth daily.   . Ferrous Sulfate (IRON) 325 (65 Fe) MG TABS Iron (ferrous sulfate) 325 mg (65 mg iron) tablet  TAKE 1 TABLET (325 MG) BY ORAL ROUTE 3x a week  . gabapentin (NEURONTIN) 100 MG capsule Take 1 capsule (100 mg total) by mouth 3 (three) times daily. (Patient taking differently: Take 100 mg by mouth 2 (two) times daily. )  . glimepiride (AMARYL) 4 MG tablet Take 4 mg by mouth 2 (two) times daily.   . insulin lispro (HUMALOG) 100 UNIT/ML injection Inject 20 Units into the skin 2 (two) times daily.   Marland Kitchen labetalol (NORMODYNE) 200 MG tablet Take 200 mg by mouth 2 (two) times daily.   . nitroGLYCERIN (NITROSTAT) 0.4 MG SL tablet Place 0.4 mg under the tongue every 5 (five) minutes as needed for chest pain.  Marland Kitchen omeprazole (PRILOSEC) 20 MG capsule Take 1 capsule (20 mg total) by mouth 2 (two) times daily before a meal. (Patient taking differently: Take 20 mg by mouth daily. )  . traMADol (ULTRAM) 50 MG tablet Take 1 tablet (50 mg total) by mouth every 6 (six) hours as needed for moderate pain.  . Vitamin D, Ergocalciferol, (DRISDOL) 50000 units CAPS capsule   . [DISCONTINUED] polyethylene glycol (MIRALAX / GLYCOLAX) packet Take 17 g by mouth daily as needed.   Current Facility-Administered Medications for the 06/09/18 encounter (Telemedicine) with Monterrio Gerst, Reita Cliche, MD  Medication  . 0.9 %  sodium chloride infusion     Allergies:   Hydrochlorothiazide; Iodinated  diagnostic agents; Lisinopril; Metformin; Codeine; and Dilaudid [hydromorphone hcl]   Social History   Tobacco Use  . Smoking status: Never Smoker  . Smokeless tobacco: Never Used  Substance Use Topics  . Alcohol use: No  . Drug use: No     Family Hx: The patient's family history includes Alzheimer's disease in her father; Hypertension in her mother; Throat cancer (age of onset: 31) in her sister. There is no history of Colon cancer, Rectal cancer, or Stomach cancer.  ROS:   Please see the history of present illness.    As mentioned above All other systems reviewed and are negative.   Prior CV studies:   The following studies were reviewed today:  Results of blood test from the past were reviewed.  Her primary care physician's records were reviewed.  Labs/Other Tests and Data Reviewed:    EKG:  No ECG reviewed.  Recent Labs: No results found for requested labs within last 8760 hours.   Recent Lipid Panel Lab Results  Component Value Date/Time   CHOL 224 (H) 08/28/2013 08:49 AM   TRIG 103 08/28/2013 08:49 AM   HDL 61 08/28/2013 08:49 AM   CHOLHDL 3.7 08/28/2013 08:49 AM   LDLCALC 142 (H) 08/28/2013 08:49 AM    Wt Readings from Last 3 Encounters:  06/09/18 270 lb (122.5 kg)  10/03/16 237 lb (107.5 kg)  09/27/16 237 lb 8 oz (107.7 kg)     Objective:    Vital Signs:  BP (!) 153/79 (BP Location: Left Arm, Patient Position: Sitting, Cuff Size: Normal)   Pulse 68   Ht 5\' 5"  (1.651 m)   Wt 270 lb (122.5 kg)   BMI 44.93 kg/m    VITAL SIGNS:  reviewed  ASSESSMENT & PLAN:    1. Essential hypertension: Diet was discussed with the patient at extensive length and compliance with diet and medication stressed and she vocalized understanding.  We will initiate her on chlorthalidone 25 mg tablet daily.  She tells me that she has issues with potassium and hypotension.  She has renal insufficiency.  We will try to obtain records of blood work done at her primary care  doctor's office.  We will review this.  She is going to her primary care doctor's office in 3 days and she will have an EKG and blood work done as a follow-up for the medicine we are initiating.  My nurse will specifically call their office to the physician's nurse so we do not. 2. Diabetes mellitus: Diet was discussed this is managed by her primary care physician. 3. Chest discomfort: This is fairly atypical for coronary artery disease however in view of multiple risk factors I have given her's following recommendations.  Sublingual nitroglycerin prescription was sent, its protocol and 911 protocol explained and the patient vocalized understanding questions were answered to the patient's satisfaction.  We will also do a Lexiscan sestamibi and 6 to 8 weeks to assess her symptoms. 4. Echocardiogram will be done to assess for endorgan damage from essential hypertension. 5. She will be seen in follow-up appointment in 2 weeks or earlier if she has any concerns.  She knows to go to the nearest emergency room for any significant concerns.  Diet was discussed for obesity and risks of obesity explained and she vocalized understanding.  She had multiple questions which were answered to her satisfaction.  Total time for this evaluation was 50 minutes.  COVID-19 Education: The signs and symptoms of COVID-19 were discussed with the patient and how to seek care for testing (follow up with PCP or arrange E-visit).  The importance of social distancing was discussed today.  Time:   Today, I have spent 50 minutes with the patient with telehealth technology discussing the above problems.     Medication Adjustments/Labs and Tests Ordered: Current medicines are reviewed at length with the patient today.  Concerns regarding medicines are outlined above.   Tests Ordered: No orders of the defined types were placed in this encounter.   Medication Changes: No orders of the defined types were placed in this encounter.    Disposition:  Follow up in 2 week(s)  Signed, Jenean Lindau, MD  06/09/2018 11:43 AM    [place]

## 2018-06-09 NOTE — Telephone Encounter (Signed)
Left message for patient to discuss AVS information and HCTZ

## 2018-06-09 NOTE — Addendum Note (Signed)
Addended by: Beckey Rutter on: 06/09/2018 01:56 PM   Modules accepted: Orders

## 2018-06-09 NOTE — Patient Instructions (Addendum)
Medication Instructions:  Your physician has recommended you make the following change in your medication:   START taking chlorthalidone 25 mg (1 tablet) daily  If you need a refill on your cardiac medications before your next appointment, please call your pharmacy.   Lab work: NONE If you have labs (blood work) drawn today and your tests are completely normal, you will receive your results only by: Marland Kitchen MyChart Message (if you have MyChart) OR . A paper copy in the mail If you have any lab test that is abnormal or we need to change your treatment, we will call you to review the results.  Testing/Procedures: Your physician has requested that you have an echocardiogram. YOU HAVE BEEN SCHEDULED FOR 08/07/18 at 09:15 AM. Echocardiography is a painless test that uses sound waves to create images of your heart. It provides your doctor with information about the size and shape of your heart and how well your heart's chambers and valves are working. This procedure takes approximately one hour. There are no restrictions for this procedure.   Your physician has requested that you have a lexiscan myoview. For further information please visit [email or web address]. Please follow instruction sheet, as given.    [facility] [address] [place], [place] [address] Phone:  [phone]  June 09, 2018    [name] [name] DOB: Feb 28, 1960 MRN: [id number] [address] [place] [address]   Dear Ms. Cotten,  You are scheduled for a Myocardial Perfusion Imaging Study on: [place] for 07/15/18 at 07:45 am. Please arrive 15 minutes prior to your appointment time for registration and insurance purposes.  The test will require two separate day appts and each day will take approximately 3 to 4 hours to complete; you may bring reading material.  If someone comes with you to your appointment, they will need to remain in the main lobby due to limited space in the  testing area. **If you are pregnant or breastfeeding, please notify the nuclear lab prior to your appointment**  How to prepare for your Myocardial Perfusion Test: . Do not eat or drink 3 hours prior to your test, except you may have water. . Do not consume products containing caffeine (regular or decaffeinated) 12 hours prior to your test. (ex: coffee, chocolate, sodas, tea). Do bring a list of your current medications with you.  If not listed below, you may take your medications as normal. . DO NOT take insulin prior to procedure. Bring the medication to your appointment as you may be required to take it once the test is complete. . Do not take glimepiride and labetolol for 12 hours prior to the test.  Bring the medication to your appointment as you may be required to take it once the test is complete. .  . Do wear comfortable clothes (no dresses or overalls) and walking shoes, tennis shoes preferred (No heels or open toe shoes are allowed). . Do NOT wear cologne, perfume, aftershave, or lotions (deodorant is allowed). . If these instructions are not followed, your test will have to be rescheduled.  Please report to [address] for your test.  If you have questions or concerns about your appointment, you can call the [facility] at [phone].  If you cannot keep your appointment, please provide 24 hours notification to the Nuclear Lab, to avoid a possible $50 charge to your account.  Follow-Up: At [facility], you and your health needs are  our priority.  As part of our continuing mission to provide you with exceptional heart care, we have created designated Provider Care Teams.  These Care Teams include your primary Cardiologist (physician) and Advanced Practice Providers (APPs -  Physician Assistants and Nurse Practitioners) who all work together to provide you with the care you need, when you need it. You will need a follow up appointment in 2  weeks.   Any Other Special Instructions Will Be Listed Below   Echocardiogram An echocardiogram is a procedure that uses painless sound waves (ultrasound) to produce an image of the heart. Images from an echocardiogram can provide important information about:  Signs of coronary artery disease (CAD).  Aneurysm detection. An aneurysm is a weak or damaged part of an artery wall that bulges out from the normal force of blood pumping through the body.  Heart size and shape. Changes in the size or shape of the heart can be associated with certain conditions, including heart failure, aneurysm, and CAD.  Heart muscle function.  Heart valve function.  Signs of a past heart attack.  Fluid buildup around the heart.  Thickening of the heart muscle.  A tumor or infectious growth around the heart valves. Tell a health care provider about:  Any allergies you have.  All medicines you are taking, including vitamins, herbs, eye drops, creams, and over-the-counter medicines.  Any blood disorders you have.  Any surgeries you have had.  Any medical conditions you have.  Whether you are pregnant or may be pregnant. What are the risks? Generally, this is a safe procedure. However, problems may occur, including:  Allergic reaction to dye (contrast) that may be used during the procedure. What happens before the procedure? No specific preparation is needed. You may eat and drink normally. What happens during the procedure?   An IV tube may be inserted into one of your veins.  You may receive contrast through this tube. A contrast is an injection that improves the quality of the pictures from your heart.  A gel will be applied to your chest.  A wand-like tool (transducer) will be moved over your chest. The gel will help to transmit the sound waves from the transducer.  The sound waves will harmlessly bounce off of your heart to allow the heart images to be captured in real-time motion. The  images will be recorded on a computer. The procedure may vary among health care providers and hospitals. What happens after the procedure?  You may return to your normal, everyday life, including diet, activities, and medicines, unless your health care provider tells you not to do that. Summary  An echocardiogram is a procedure that uses painless sound waves (ultrasound) to produce an image of the heart.  Images from an echocardiogram can provide important information about the size and shape of your heart, heart muscle function, heart valve function, and fluid buildup around your heart.  You do not need to do anything to prepare before this procedure. You may eat and drink normally.  After the echocardiogram is completed, you may return to your normal, everyday life, unless your health care provider tells you not to do that. This information is not intended to replace advice given to you by your health care provider. Make sure you discuss any questions you have with your health care provider. Document Released: 01/27/2000 Document Revised: 03/03/2016 Document Reviewed: 03/03/2016 Elsevier Interactive Patient Education  2019 [place] injection What is this medicine? REGADENOSON is used  to test the heart for coronary artery disease. It is used in patients who can not exercise for their stress test. This medicine may be used for other purposes; ask your health care provider or pharmacist if you have questions. COMMON BRAND NAME(S): Lexiscan What should I tell my health care provider before I take this medicine? They need to know if you have any of these conditions: -heart problems -lung or breathing disease, like asthma or COPD -an unusual or allergic reaction to regadenoson, other medicines, foods, dyes, or preservatives -pregnant or trying to get pregnant -breast-feeding How should I use this medicine? This medicine is for injection into a vein. It is given by a  health care professional in a hospital or clinic setting. Talk to your pediatrician regarding the use of this medicine in children. Special care may be needed. Overdosage: If you think you have taken too much of this medicine contact a poison control center or emergency room at once. NOTE: This medicine is only for you. Do not share this medicine with others. What if I miss a dose? This does not apply. What may interact with this medicine? -caffeine -dipyridamole -guarana -theophylline This list may not describe all possible interactions. Give your health care provider a list of all the medicines, herbs, non-prescription drugs, or dietary supplements you use. Also tell them if you smoke, drink alcohol, or use illegal drugs. Some items may interact with your medicine. What should I watch for while using this medicine? Your condition will be monitored carefully while you are receiving this medicine. Do not take medicines, foods, or drinks with caffeine (like coffee, tea, or colas) for at least 12 hours before your test. If you do not know if something contains caffeine, ask your health care professional. What side effects may I notice from receiving this medicine? Side effects that you should report to your doctor or health care professional as soon as possible: -allergic reactions like skin rash, itching or hives, swelling of the face, lips, or tongue -breathing problems -chest pain, tightness or palpitations -severe headache Side effects that usually do not require medical attention (report to your doctor or health care professional if they continue or are bothersome): -flushing -headache -irritation or pain at site where injected -nausea, vomiting This list may not describe all possible side effects. Call your doctor for medical advice about side effects. You may report side effects to FDA at 1-800-FDA-1088. Where should I keep my medicine? This drug is given in a hospital or clinic and  will not be stored at home. NOTE: This sheet is a summary. It may not cover all possible information. If you have questions about this medicine, talk to your doctor, pharmacist, or health care provider.  2019 Elsevier/Gold Standard (2007-09-29 15:08:13)   Cardiac Nuclear Scan A cardiac nuclear scan is a test that is done to check the flow of blood to your heart. It is done when you are resting and when you are exercising. The test looks for problems such as:  Not enough blood reaching a portion of the heart.  The heart muscle not working as it should. You may need this test if:  You have heart disease.  You have had lab results that are not normal.  You have had heart surgery or a balloon procedure to open up blocked arteries (angioplasty).  You have chest pain.  You have shortness of breath. In this test, a special dye (tracer) is put into your bloodstream. The tracer will travel to  your heart. A camera will then take pictures of your heart to see how the tracer moves through your heart. This test is usually done at a hospital and takes 2-4 hours. Tell a doctor about:  Any allergies you have.  All medicines you are taking, including vitamins, herbs, eye drops, creams, and over-the-counter medicines.  Any problems you or family members have had with anesthetic medicines.  Any blood disorders you have.  Any surgeries you have had.  Any medical conditions you have.  Whether you are pregnant or may be pregnant. What are the risks? Generally, this is a safe test. However, problems may occur, such as:  Serious chest pain and heart attack. This is only a risk if the stress portion of the test is done.  Rapid heartbeat.  A feeling of warmth in your chest. This feeling usually does not last long.  Allergic reaction to the tracer. What happens before the test?  Ask your doctor about changing or stopping your normal medicines. This is important.  Follow instructions from  your doctor about what you cannot eat or drink.  Remove your jewelry on the day of the test. What happens during the test?  An IV tube will be inserted into one of your veins.  Your doctor will give you a small amount of tracer through the IV tube.  You will wait for 20-40 minutes while the tracer moves through your bloodstream.  Your heart will be monitored with an electrocardiogram (ECG).  You will lie down on an exam table.  Pictures of your heart will be taken for about 15-20 minutes.  You may also have a stress test. For this test, one of these things may be done: ? You will be asked to exercise on a treadmill or a stationary bike. ? You will be given medicines that will make your heart work harder. This is done if you are unable to exercise.  When blood flow to your heart has peaked, a tracer will again be given through the IV tube.  After 20-40 minutes, you will get back on the exam table. More pictures will be taken of your heart.  Depending on the tracer that is used, more pictures may need to be taken 3-4 hours later.  Your IV tube will be removed when the test is over. The test may vary among doctors and hospitals. What happens after the test?  Ask your doctor: ? Whether you can return to your normal schedule, including diet, activities, and medicines. ? Whether you should drink more fluids. This will help to remove the tracer from your body. Drink enough fluid to keep your pee (urine) pale yellow.  Ask your doctor, or the department that is doing the test: ? When will my results be ready? ? How will I get my results? Summary  A cardiac nuclear scan is a test that is done to check the flow of blood to your heart.  Tell your doctor whether you are pregnant or may be pregnant.  Before the test, ask your doctor about changing or stopping your normal medicines. This is important.  Ask your doctor whether you can return to your normal activities. You may be asked  to drink more fluids. This information is not intended to replace advice given to you by your health care provider. Make sure you discuss any questions you have with your health care provider. Document Released: 07/15/2017 Document Revised: 07/15/2017 Document Reviewed: 07/15/2017 Elsevier Interactive Patient Education  2019 [employer].

## 2018-06-09 NOTE — Addendum Note (Signed)
Addended by: Beckey Rutter on: 06/09/2018 04:56 PM   Modules accepted: Orders

## 2018-06-16 ENCOUNTER — Telehealth: Payer: Self-pay

## 2018-06-16 NOTE — Telephone Encounter (Signed)
Patient was seen by Dr.RRR on 06/09/2018 concerning essential hypertension. Dr. Spero Curb called because he states patient was referred for her syncope and was not sure that this issue has been addressed. Information relayed to Dr. Docia Furl for advisement whether patient needs an additional visit?

## 2018-06-17 NOTE — Telephone Encounter (Signed)
RN called both numbers and left message for patient to call back to review AVS,meds and schedule 2 wk f/u.

## 2018-06-17 NOTE — Telephone Encounter (Signed)
She has 2 wk appt

## 2018-06-18 MED FILL — SPS 15 GM/60 ML SUSPENSION: 15 | 1 days supply | Qty: 120 | Fill #0

## 2018-06-27 NOTE — Telephone Encounter (Signed)
Left message for patient to call back. She never returned call concerning AVS, follow up or medication changes.

## 2018-07-08 IMAGING — US US ABDOMEN COMPLETE W/ ELASTOGRAPHY
1 series · 13 of 25 positions shown · non-contrast
Comparison: CT scan 11/17/2015

CLINICAL DATA: Chronic Hepatitis-C.



[Series 1: us abdomen complete w/ elastography · 0.28mm/px · 13 of 104 slices shown]
[im 1/104]
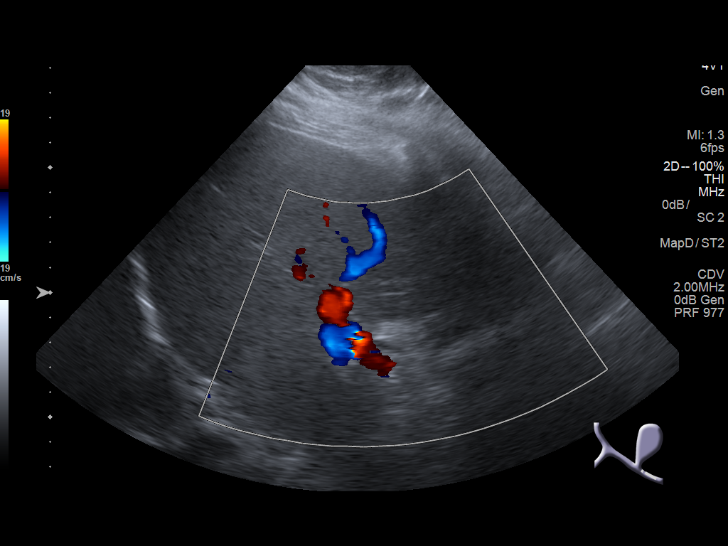
[im 9/104]
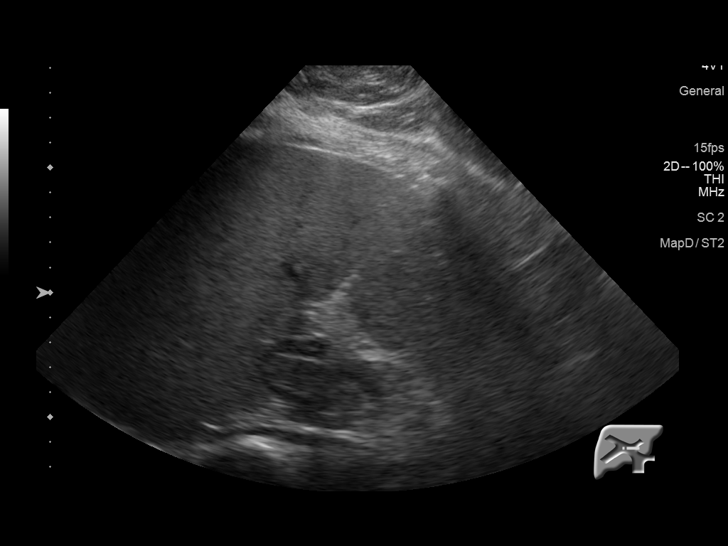
[im 18/104]
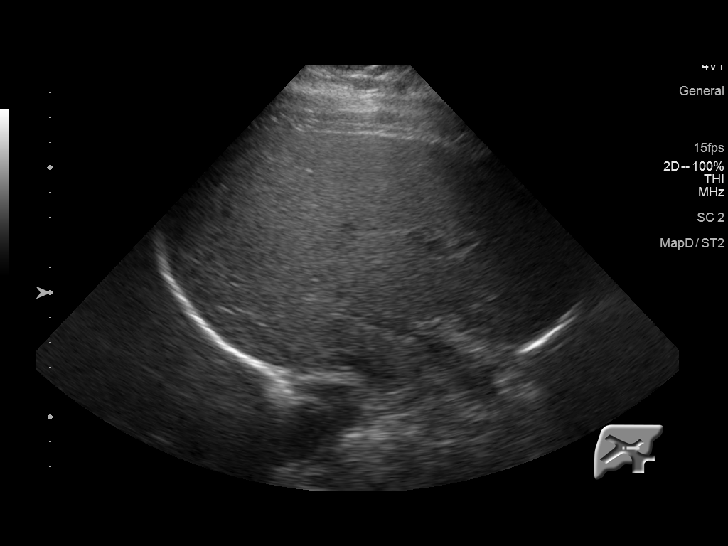
[im 26/104]
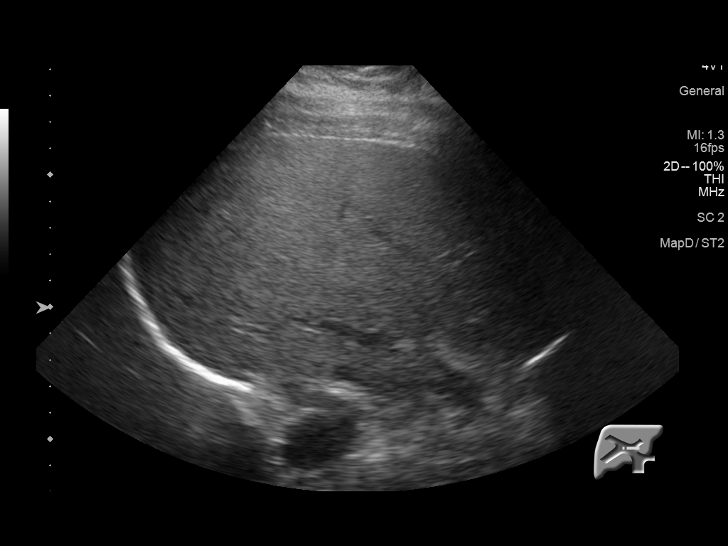
[im 35/104]
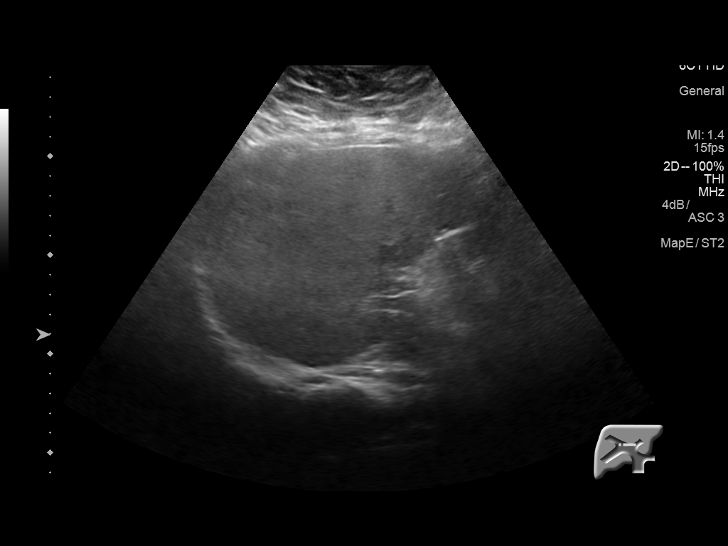
[im 43/104]
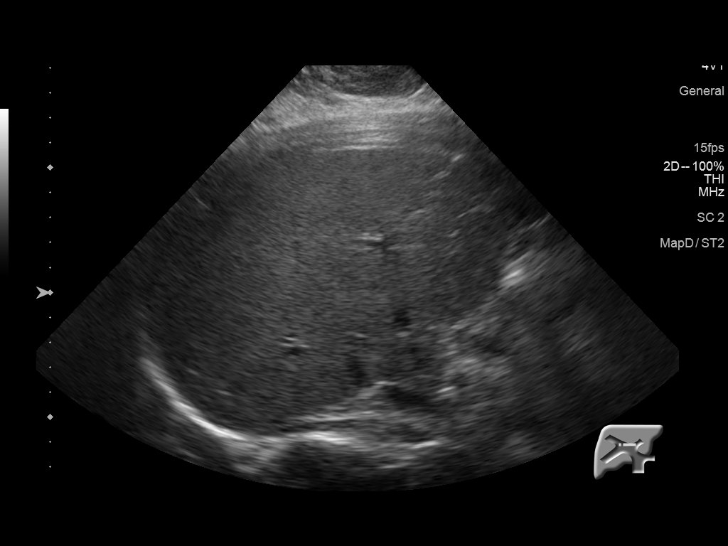
[im 52/104]
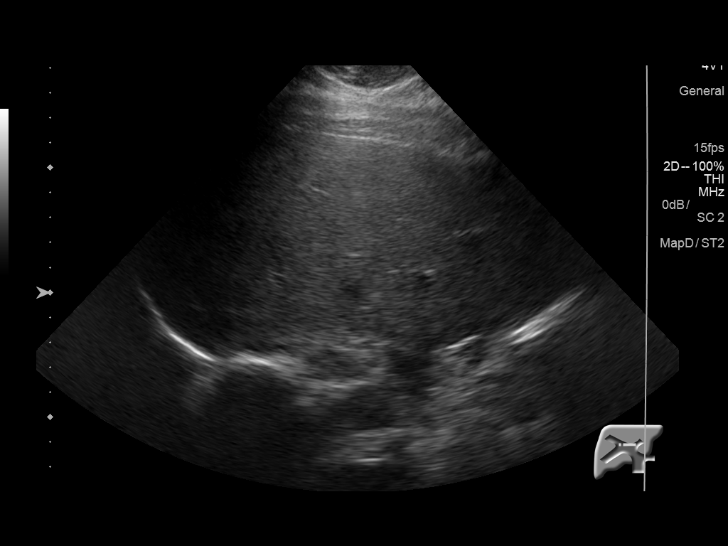
[im 61/104]
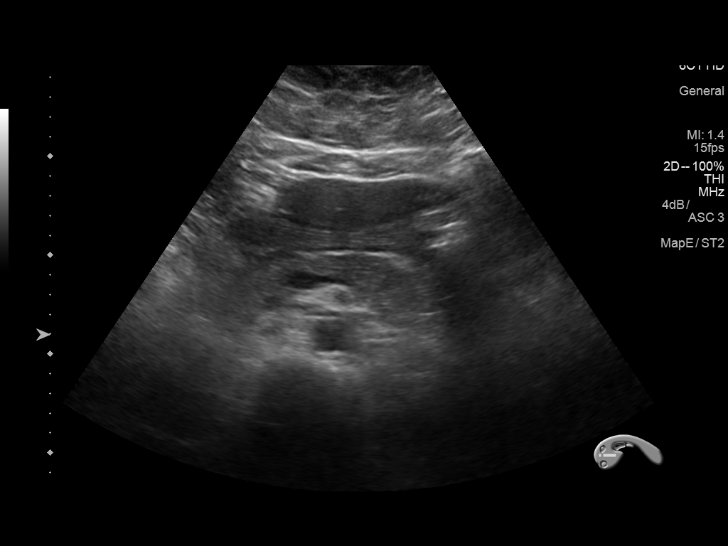
[im 69/104]
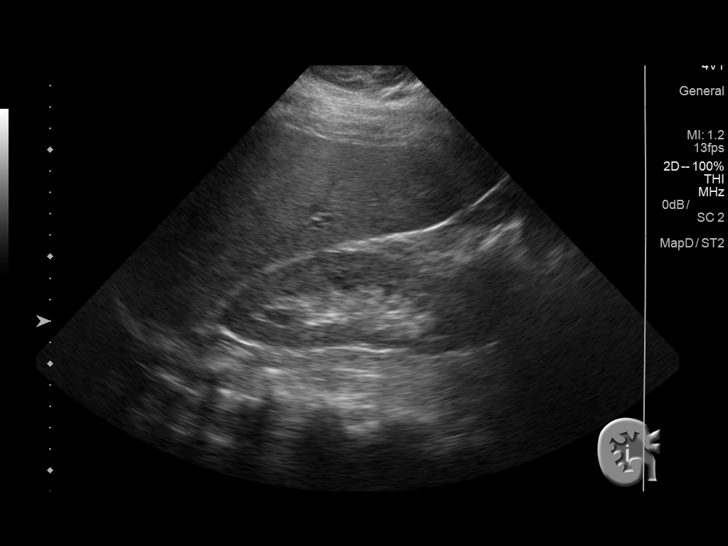
[im 78/104]
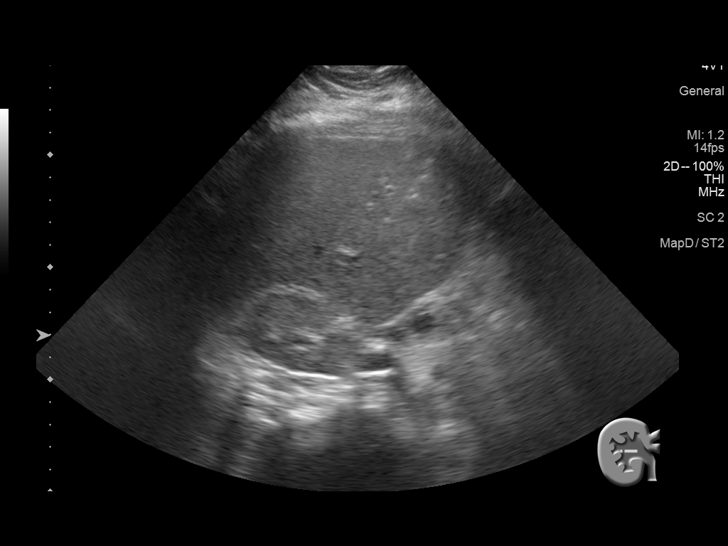
[im 86/104]
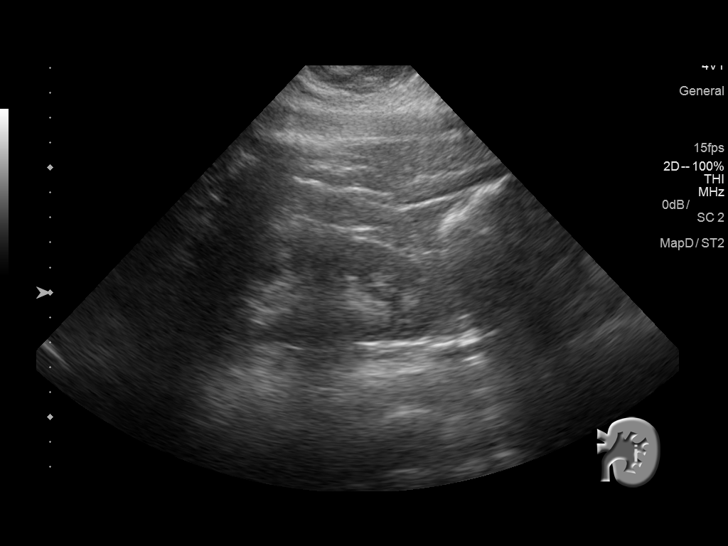
[im 95/104]
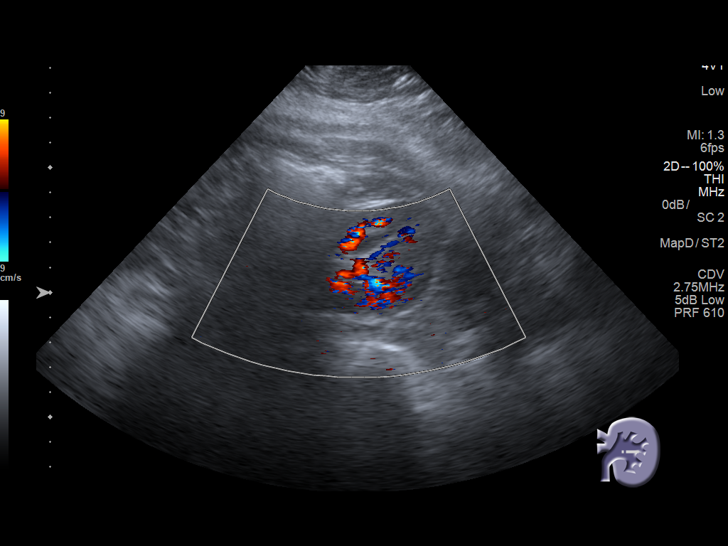
[im 104/104]
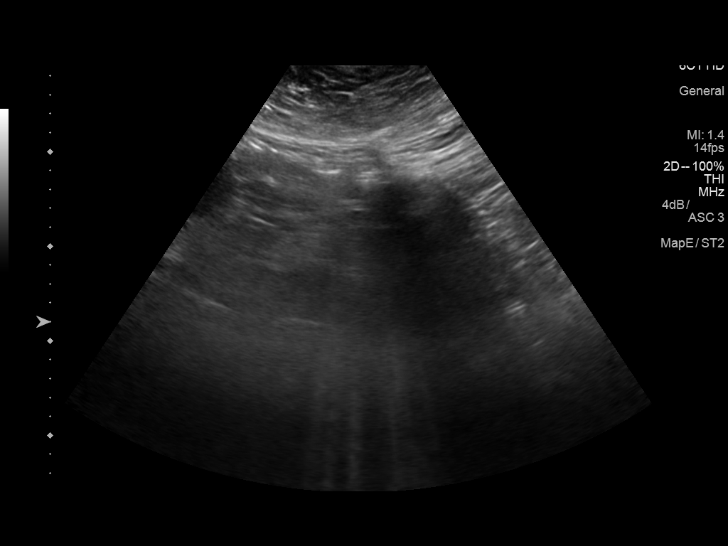

[13 of 25 positions shown; findings below may reference images not displayed]

FINDINGS: ULTRASOUND ABDOMEN

Gallbladder: Surgically absent.

Common bile duct: Diameter: 4.0 mm

Liver: Normal echogenicity without focal lesion or biliary
dilatation.

IVC: Normal caliber

Pancreas: Sonographically unremarkable.

Spleen: Normal size and echogenicity without focal lesions.

Right Kidney: Length: 13.2 cm. Normal renal cortical thickness and
echogenicity without focal lesions or hydronephrosis.

Left Kidney: Length: 11.1 cm. Normal renal cortical thickness and
echogenicity without focal lesions or hydronephrosis.

Abdominal aorta: Normal caliber

Other findings: No ascites

ULTRASOUND HEPATIC ELASTOGRAPHY

Device: Siemens Helix VTQ

Patient position: Left lateral decubitus

Transducer 4V

Number of measurements: 10

Hepatic segment:  8

Median velocity:   2.16  m/sec

IQR:

IQR/Median velocity ratio:

Corresponding Metavir fibrosis score:  F2 + some F3

Risk of fibrosis: Moderate

Limitations of exam: None

Pertinent findings noted on other imaging exams:  None

Please note that abnormal shear wave velocities may also be
identified in clinical settings other than with hepatic fibrosis,
such as: acute hepatitis, elevated right heart and central venous
pressures including use of beta blockers, Bozicek disease
([place]), infiltrative processes such as
mastocytosis/amyloidosis/infiltrative tumor, extrahepatic
cholestasis, in the post-prandial state, and liver transplantation.
Correlation with patient history, laboratory data, and clinical
condition recommended.
IMPRESSION: ULTRASOUND ABDOMEN:
Status post cholecystectomy.

No biliary dilatation.

ULTRASOUND HEPATIC ELASTOGRAPHY:

Median hepatic shear wave velocity is calculated at 2.16 m/sec.

Corresponding Metavir fibrosis score is  F2 + some F3.

Risk of fibrosis is Moderate.

Follow-up: Additional testing appropriate

## 2018-07-09 ENCOUNTER — Telehealth: Payer: Medicaid Other | Admitting: Cardiology

## 2018-07-10 ENCOUNTER — Telehealth (INDEPENDENT_AMBULATORY_CARE_PROVIDER_SITE_OTHER): Payer: Medicaid Other | Admitting: Cardiology

## 2018-07-10 ENCOUNTER — Other Ambulatory Visit: Payer: Self-pay

## 2018-07-10 ENCOUNTER — Telehealth (HOSPITAL_COMMUNITY): Payer: Self-pay | Admitting: *Deleted

## 2018-07-10 ENCOUNTER — Encounter: Payer: Self-pay | Admitting: Cardiology

## 2018-07-10 VITALS — Ht 65.0 in | Wt 270.0 lb

## 2018-07-10 DIAGNOSIS — Z6841 Body Mass Index (BMI) 40.0 and over, adult: Secondary | ICD-10-CM

## 2018-07-10 DIAGNOSIS — E7800 Pure hypercholesterolemia, unspecified: Secondary | ICD-10-CM

## 2018-07-10 DIAGNOSIS — R0789 Other chest pain: Secondary | ICD-10-CM

## 2018-07-10 DIAGNOSIS — R55 Syncope and collapse: Secondary | ICD-10-CM | POA: Diagnosis not present

## 2018-07-10 DIAGNOSIS — I10 Essential (primary) hypertension: Secondary | ICD-10-CM | POA: Diagnosis not present

## 2018-07-10 NOTE — Addendum Note (Signed)
Addended by: Beckey Rutter on: 07/10/2018 03:54 PM   Modules accepted: Orders

## 2018-07-10 NOTE — Telephone Encounter (Signed)
Patient given detailed instructions per Myocardial Perfusion Study Information Sheet for the test on 07/15/18 at 7:45. Patient notified to arrive 15 minutes early and that it is imperative to arrive on time for appointment to keep from having the test rescheduled.  If you need to cancel or reschedule your appointment, please call the office within 24 hours of your appointment. . Patient verbalized understanding.[name] [name]

## 2018-07-10 NOTE — Progress Notes (Signed)
Virtual Visit via Video Note   This visit type was conducted due to national recommendations for restrictions regarding the COVID-19 Pandemic (e.g. social distancing) in an effort to limit this patient's exposure and mitigate transmission in our community.  Due to her co-morbid illnesses, this patient is at least at moderate risk for complications without adequate follow up.  This format is felt to be most appropriate for this patient at this time.  All issues noted in this document were discussed and addressed.  A limited physical exam was performed with this format.  Please refer to the patient's chart for her consent to telehealth for [facility].   Date:  07/10/2018   ID:  [name] [name], DOB October 08, 1960, MRN [id number]  Patient Location: Home Provider Location: Office  PCP:  [name], MD  Cardiologist:  No primary care provider on file.  Electrophysiologist:  None   Evaluation Performed:  Follow-Up Visit  Chief Complaint: Essential hypertension and syncope  History of Present Illness:    [name] [name] is a 58 y.o. female with past medical history of essential hypertension and morbid obesity.  She has had 2 episodes of syncope.  She denies any problems at this time and takes care of activities of daily living.  She leads a sedentary lifestyle.  She is morbidly obese.  At the time of my evaluation, the patient is alert awake oriented and in no distress.  The patient does not have symptoms concerning for COVID-19 infection (fever, chills, cough, or new shortness of breath).    Past Medical History:  Diagnosis Date  . Anemia   . Anxiety   . Arthritis    "hips" (08/28/2013)  . Chronic lower back pain    10-27-13 not a problem at this time  . Depression   . Esophageal spasm   . Frequent UTI   . GERD (gastroesophageal reflux disease)   . Hepatitis C    genotype 1B  . High cholesterol   . Hypertension   . Migraine    "monthly" (08/28/2013)  . Pneumonia    "several  times"  . Type II diabetes mellitus ([place])   . Ulcer    hx gastric ulcers   Past Surgical History:  Procedure Laterality Date  . ABDOMINAL HYSTERECTOMY    . APPENDECTOMY    . BALLOON DILATION N/A 12/29/2012   Procedure: BALLOON DILATION;  Surgeon: Inda Castle, MD;  Location: [facility];  Service: Endoscopy;  Laterality: N/A;  . BALLOON DILATION N/A 06/25/2013   Procedure: BALLOON DILATION;  Surgeon: Inda Castle, MD;  Location: [facility];  Service: Endoscopy;  Laterality: N/A;  . BOTOX INJECTION  12/29/2012   Procedure: BOTOX INJECTION;  Surgeon: Inda Castle, MD;  Location: [facility];  Service: Endoscopy;;  . BOTOX INJECTION N/A 06/25/2013   Procedure: BOTOX INJECTION;  Surgeon: Inda Castle, MD;  Location: [facility];  Service: Endoscopy;  Laterality: N/A;  . CARDIAC CATHETERIZATION  X 2   /notes 08/28/2013  . CARPAL TUNNEL RELEASE Right   . CHOLECYSTECTOMY    . COLONOSCOPY  04/10/2012   Normal   . ESOPHAGEAL MANOMETRY N/A 12/26/2015   Procedure: ESOPHAGEAL MANOMETRY (EM);  Surgeon: Doran Stabler, MD;  Location: [facility];  Service: Gastroenterology;  Laterality: N/A;  . ESOPHAGOGASTRODUODENOSCOPY N/A 12/29/2012   Procedure: ESOPHAGOGASTRODUODENOSCOPY (EGD);  Surgeon: Inda Castle, MD;  Location: [facility];  Service: Endoscopy;  Laterality: N/A;  . ESOPHAGOGASTRODUODENOSCOPY N/A 06/25/2013   Procedure: ESOPHAGOGASTRODUODENOSCOPY (EGD);  Surgeon: Inda Castle, MD;  Location: [facility];  Service: Endoscopy;  Laterality: N/A;  . ESOPHAGOGASTRODUODENOSCOPY N/A 11/03/2013   Procedure: ESOPHAGOGASTRODUODENOSCOPY (EGD);  Surgeon: Inda Castle, MD;  Location: [facility];  Service: Endoscopy;  Laterality: N/A;  Botox - ballon dilation  . ESOPHAGOGASTRODUODENOSCOPY N/A 08/14/2016   Procedure: ESOPHAGOGASTRODUODENOSCOPY (EGD);  Surgeon: Milus Banister, MD;  Location: [facility];  Service: Endoscopy;  Laterality: N/A;  . FOOT FRACTURE SURGERY Right    . HEEL SPUR EXCISION Right   . TONSILLECTOMY    . TUBAL LIGATION       Current Meds  Medication Sig  . acetaminophen (TYLENOL) 500 MG tablet Take 1,000 mg by mouth every 4 (four) hours as needed.   Marland Kitchen albuterol (PROAIR HFA) 108 (90 Base) MCG/ACT inhaler proair hfa 108 (90 base) mcg/act aers  . cloNIDine (CATAPRES) 0.1 MG tablet Take 0.1 mg by mouth 2 (two) times daily.   . cyclobenzaprine (FLEXERIL) 5 MG tablet Take 5 mg by mouth daily.   . Ferrous Sulfate (IRON) 325 (65 Fe) MG TABS Iron (ferrous sulfate) 325 mg (65 mg iron) tablet  TAKE 1 TABLET (325 MG) BY ORAL ROUTE 3x a week  . gabapentin (NEURONTIN) 100 MG capsule Take 1 capsule (100 mg total) by mouth 3 (three) times daily. (Patient taking differently: Take 100 mg by mouth 2 (two) times daily. )  . glimepiride (AMARYL) 4 MG tablet Take 4 mg by mouth 2 (two) times daily.   Marland Kitchen HUMULIN N 100 UNIT/ML injection INJECT 23 26 UNITS IN THE MORNING AND 14 17 UNITS IN THE EVENING; START WITH LOWER DOSES FIRST  . insulin lispro (HUMALOG) 100 UNIT/ML injection Inject 20 Units into the skin 2 (two) times daily.   Marland Kitchen labetalol (NORMODYNE) 300 MG tablet Take 300 mg by mouth 2 (two) times daily.  . nitroGLYCERIN (NITROSTAT) 0.4 MG SL tablet Place 0.4 mg under the tongue every 5 (five) minutes as needed for chest pain.  Marland Kitchen omeprazole (PRILOSEC) 20 MG capsule Take 1 capsule (20 mg total) by mouth 2 (two) times daily before a meal. (Patient taking differently: Take 20 mg by mouth daily. )  . traMADol (ULTRAM) 50 MG tablet Take 1 tablet (50 mg total) by mouth every 6 (six) hours as needed for moderate pain.  . Vitamin D, Ergocalciferol, (DRISDOL) 50000 units CAPS capsule    Current Facility-Administered Medications for the 07/10/18 encounter (Telemedicine) with TRUE Garciamartinez, Reita Cliche, MD  Medication  . 0.9 %  sodium chloride infusion     Allergies:   Hydrochlorothiazide; Iodinated diagnostic agents; Lisinopril; Metformin; Codeine; and Dilaudid [hydromorphone  hcl]   Social History   Tobacco Use  . Smoking status: Never Smoker  . Smokeless tobacco: Never Used  Substance Use Topics  . Alcohol use: No  . Drug use: No     Family Hx: The patient's family history includes Alzheimer's disease in her father; Hypertension in her mother; Throat cancer (age of onset: 61) in her sister. There is no history of Colon cancer, Rectal cancer, or Stomach cancer.  ROS:   Please see the history of present illness.    As mentioned above All other systems reviewed and are negative.   Prior CV studies:   The following studies were reviewed today:  None  Labs/Other Tests and Data Reviewed:    EKG:  No ECG reviewed.  Recent Labs: No results found for requested labs within last 8760 hours.   Recent Lipid Panel Lab Results  Component Value  Date/Time   CHOL 224 (H) 08/28/2013 08:49 AM   TRIG 103 08/28/2013 08:49 AM   HDL 61 08/28/2013 08:49 AM   CHOLHDL 3.7 08/28/2013 08:49 AM   LDLCALC 142 (H) 08/28/2013 08:49 AM    Wt Readings from Last 3 Encounters:  07/10/18 270 lb (122.5 kg)  06/09/18 270 lb (122.5 kg)  10/03/16 237 lb (107.5 kg)     Objective:    Vital Signs:  Ht 5\' 5"  (1.651 m)   Wt 270 lb (122.5 kg)   BMI 44.93 kg/m    VITAL SIGNS:  reviewed  ASSESSMENT & PLAN:    1. Syncope: I discussed my findings with the patient at extensive length.  She had blood work done at her primary care's office.  I would like to get a copy of those records particularly to evaluate issues like thyroid.  She is due for event monitor.  We will do a 1 month event monitor to understand if she has any atrial arrhythmias causing this. 2. Essential hypertension: Her blood pressure will need to be monitored.  She has not checked it today.  It runs generally very high.  She has not followed up chlorthalidone prescription.  She will start taking 25 mg daily and will be seen in follow-up appointment in a week.  I will monitor her Chem-7 at that time 3. Morbid  obesity: Risks of obesity explained and she vocalized understanding.  Diet was emphasized.  Follow-up appointment in a week or earlier if she has any concerns.  COVID-19 Education: The signs and symptoms of COVID-19 were discussed with the patient and how to seek care for testing (follow up with PCP or arrange E-visit).  The importance of social distancing was discussed today.  Time:   Today, I have spent 15 minutes with the patient with telehealth technology discussing the above problems.     Medication Adjustments/Labs and Tests Ordered: Current medicines are reviewed at length with the patient today.  Concerns regarding medicines are outlined above.   Tests Ordered: No orders of the defined types were placed in this encounter.   Medication Changes: No orders of the defined types were placed in this encounter.   Disposition:  Follow up in 1 week(s)  Signed, Jenean Lindau, MD  07/10/2018 2:35 PM    [place]

## 2018-07-10 NOTE — Patient Instructions (Signed)
Medication Instructions:  Your physician recommends that you continue on your current medications as directed. Please refer to the Current Medication list given to you today.  If you need a refill on your cardiac medications before your next appointment, please call your pharmacy.   Lab work: YOU will need to come into the office to have a BMP drawn in the next week.   If you have labs (blood work) drawn today and your tests are completely normal, you will receive your results only by: Marland Kitchen MyChart Message (if you have MyChart) OR . A paper copy in the mail If you have any lab test that is abnormal or we need to change your treatment, we will call you to review the results.  Testing/Procedures: Your physician has recommended that you wear an event monitor. Event monitors are medical devices that record the heart's electrical activity. Doctors most often [place] these monitors to diagnose arrhythmias. Arrhythmias are problems with the speed or rhythm of the heartbeat. The monitor is a small, portable device. You can wear one while you do your normal daily activities. This is usually used to diagnose what is causing palpitations/syncope (passing out). This device will be mailed to you and you will need to wear it for 30 days.    Follow-Up: At [facility], you and your health needs are our priority.  As part of our continuing mission to provide you with exceptional heart care, we have created designated Provider Care Teams.  These Care Teams include your primary Cardiologist (physician) and Advanced Practice Providers (APPs -  Physician Assistants and Nurse Practitioners) who all work together to provide you with the care you need, when you need it. You will need a follow up appointment in 1 weeks.    Any Other Special Instructions Will Be Listed Below  Ambulatory Cardiac Monitoring An ambulatory cardiac monitor is a small recording device that is used to detect abnormal heart rhythms  (arrhythmias). Most monitors are connected by wires to flat, sticky disks (electrodes) that are then attached to your chest. You may need to wear a monitor if you have had symptoms such as:  Fast heartbeats (palpitations).  Dizziness.  Fainting or light-headedness.  Unexplained weakness.  Shortness of breath. There are several types of monitors. Some common monitors include:  Holter monitor. This records your heart rhythm continuously, usually for 24-48 hours.  Event (episodic) monitor. This monitor has a symptoms button, and when pushed, it will begin recording. You need to activate this monitor to record when you have a heart-related symptom.  Automatic detection monitor. This monitor will begin recording when it detects an abnormal heartbeat. What are the risks? Generally, these devices are safe to use. However, it is possible that the skin under the electrodes will become irritated. How to prepare for monitoring Your health care provider will prepare your chest for the electrode placement and show you how to use the monitor.  Do not apply lotions to your chest before monitoring.  Follow directions on how to care for the monitor, and how to return the monitor when the testing period is complete. How to use your cardiac monitor  Follow directions about how long to wear the monitor, and if you can take the monitor off in order to shower or bathe. ? Do not let the monitor get wet. ? Do not bathe, swim, or use a hot tub while wearing the monitor.  Keep your skin clean. Do not put body lotion or moisturizer on your chest.  Change the electrodes as told by your health care provider, or any time they stop sticking to your skin. You may need to use medical tape to keep them on.  Try to put the electrodes in slightly different places on your chest to help prevent skin irritation. Follow directions from your health care provider about where to place the electrodes.  Make sure the  monitor is safely clipped to your clothing or in a location close to your body as recommended by your health care provider.  If your monitor has a symptoms button, press the button to mark an event as soon as you feel a heart-related symptom, such as: ? Dizziness. ? Weakness. ? Light-headedness. ? Palpitations. ? Thumping or pounding in your chest. ? Shortness of breath. ? Unexplained weakness.  Keep a diary of your activities, such as walking, doing chores, and taking medicine. It is very important to note what you were doing when you pushed the button to record your symptoms. This will help your health care provider determine what might be contributing to your symptoms.  Send the recorded information as recommended by your health care provider. It may take some time for your health care provider to process the results.  Change the batteries as told by your health care provider.  Keep electronic devices away from your monitor. These include: ? Tablets. ? MP3 players. ? Cell phones.  While wearing your monitor you should avoid: ? Electric blankets. ? Armed forces operational officer. ? Electric toothbrushes. ? Microwave ovens. ? Magnets. ? Metal detectors. Get help right away if:  You have chest pain.  You have shortness of breath or extreme difficulty breathing.  You develop a very fast heartbeat that does not get better.  You develop dizziness that does not go away.  You faint or constantly feel like you are about to faint. Summary  An ambulatory cardiac monitor is a small recording device that is used to detect abnormal heart rhythms (arrhythmias).  Make sure you understand how to send the information from the monitor to your health care provider.  It is important to press the button on the monitor when you have any heart-related symptoms.  Keep a diary of your activities, such as walking, doing chores, and taking medicine. It is very important to note what you were doing when you  pushed the button to record your symptoms. This will help your health care provider learn what might be causing your symptoms. This information is not intended to replace advice given to you by your health care provider. Make sure you discuss any questions you have with your health care provider. Document Released: 11/08/2007 Document Revised: 11/14/2016 Document Reviewed: 01/14/2016 Elsevier Interactive Patient Education  2019 [employer].

## 2018-07-14 ENCOUNTER — Other Ambulatory Visit: Payer: Self-pay | Admitting: *Deleted

## 2018-07-14 DIAGNOSIS — R55 Syncope and collapse: Secondary | ICD-10-CM

## 2018-07-14 MED FILL — SPS 15 GM/60 ML SUSPENSION: 15 | 3 days supply | Qty: 240 | Fill #0

## 2018-07-15 ENCOUNTER — Ambulatory Visit (INDEPENDENT_AMBULATORY_CARE_PROVIDER_SITE_OTHER): Payer: Medicaid Other

## 2018-07-15 ENCOUNTER — Other Ambulatory Visit: Payer: Self-pay

## 2018-07-15 DIAGNOSIS — R0789 Other chest pain: Secondary | ICD-10-CM

## 2018-07-15 DIAGNOSIS — I10 Essential (primary) hypertension: Secondary | ICD-10-CM

## 2018-07-15 MED ORDER — TECHNETIUM TC 99M TETROFOSMIN IV KIT
9.9000 | PACK | Freq: Once | INTRAVENOUS | Status: AC | PRN
  Administered 2018-07-15: 9.9 via INTRAVENOUS

## 2018-07-15 MED ORDER — REGADENOSON 0.4 MG/5ML IV SOLN
0.4000 mg | Freq: Once | INTRAVENOUS | Status: AC
  Administered 2018-07-15: 0.4 mg via INTRAVENOUS

## 2018-07-15 MED ORDER — TECHNETIUM TC 99M TETROFOSMIN IV KIT
32.7000 | PACK | Freq: Once | INTRAVENOUS | Status: AC | PRN
  Administered 2018-07-15: 32.7 via INTRAVENOUS

## 2018-07-16 LAB — MYOCARDIAL PERFUSION IMAGING
LV dias vol: 108 mL (ref 46–106)
LV sys vol: 44 mL
Peak HR: 80 {beats}/min
Rest HR: 62 {beats}/min
SDS: 0
SRS: 1
SSS: 1
TID: 1.21

## 2018-07-17 ENCOUNTER — Telehealth: Payer: Self-pay

## 2018-07-17 NOTE — Telephone Encounter (Signed)
Left message of negative results, patient instructed to call back with questions. Copy of information sent to Dr. Spero Curb office per Dr. Docia Furl

## 2018-07-17 NOTE — Telephone Encounter (Signed)
-----   Message from Jenean Lindau, MD sent at 07/17/2018 11:26 AM EDT ----- The results of the study is unremarkable. Please inform patient. I will discuss in detail at next appointment. Cc  primary care/referring physician Jenean Lindau, MD 07/17/2018 11:26 AM

## 2018-07-18 ENCOUNTER — Encounter (INDEPENDENT_AMBULATORY_CARE_PROVIDER_SITE_OTHER): Payer: Medicaid Other

## 2018-07-18 DIAGNOSIS — R55 Syncope and collapse: Secondary | ICD-10-CM

## 2018-07-25 DIAGNOSIS — M5416 Radiculopathy, lumbar region: Secondary | ICD-10-CM

## 2018-07-25 HISTORY — DX: Radiculopathy, lumbar region: M54.16

## 2018-08-04 ENCOUNTER — Other Ambulatory Visit: Payer: Self-pay

## 2018-08-04 DIAGNOSIS — R55 Syncope and collapse: Secondary | ICD-10-CM

## 2018-08-05 ENCOUNTER — Telehealth: Payer: Self-pay | Admitting: Student

## 2018-08-05 ENCOUNTER — Other Ambulatory Visit: Payer: Self-pay | Admitting: Student

## 2018-08-05 DIAGNOSIS — N183 Chronic kidney disease, stage 3 unspecified: Secondary | ICD-10-CM

## 2018-08-05 LAB — BASIC METABOLIC PANEL
BUN/Creatinine Ratio: 21 (ref 9–23)
BUN: 60 mg/dL — ABNORMAL HIGH (ref 6–24)
CO2: 18 mmol/L — ABNORMAL LOW (ref 20–29)
Calcium: 8.4 mg/dL — ABNORMAL LOW (ref 8.7–10.2)
Chloride: 98 mmol/L (ref 96–106)
Creatinine, Ser: 2.83 mg/dL — ABNORMAL HIGH (ref 0.57–1.00)
GFR calc Af Amer: 21 mL/min/{1.73_m2} — ABNORMAL LOW (ref >59)
GFR calc non Af Amer: 18 mL/min/{1.73_m2} — ABNORMAL LOW (ref >59)
Glucose: 511 mg/dL (ref 65–99)
Potassium: 5.3 mmol/L — ABNORMAL HIGH (ref 3.5–5.2)
Sodium: 131 mmol/L — ABNORMAL LOW (ref 134–144)

## 2018-08-05 NOTE — Progress Notes (Signed)
ruf

## 2018-08-05 NOTE — Telephone Encounter (Signed)
   Received page form [facility] about critical lab value with glucose of 511. Called and spoke with patient. She has known type 2 diabetes and is on insulin. She rechecked her blood sugar last night after lab was drawn and blood glucose was 332. Patient states her blood sugar has been running high lately in the 300's because she just got steroid shots in her back. She has discussed this with her PCP who she states is okay with her blood sugars being in the 300's. Advised patient to recheck her blood sugar this morning and call PCP if still significantly elevated close to the 500's or if she develops symptoms concerning for hyperglycemia.   Serum creatinine also noted to be elevated at 2.38 with potassium of 5.3. No recent labs in system for comparison. Last lab was from 08/2016 - creatinine 1.59 and potassium 4.1 at that time.   Advised patient to hold home Chlorthalidone for the next several days and will repeat BMET on Thursday 08/07/2018.  Will route to Dr. Geraldo Pitter so that he is aware.   Darreld Mclean, PA-C 08/05/2018 8:41 AM

## 2018-08-05 NOTE — Telephone Encounter (Signed)
Please call her primary care physician's nurse and let them know.  Please also let patient know that this will be managed by her primary care providers.

## 2018-08-05 NOTE — Telephone Encounter (Signed)
Fax sent and information relayed to [place]-Dr. Sistasis office.

## 2018-08-07 ENCOUNTER — Other Ambulatory Visit: Payer: Self-pay

## 2018-08-07 ENCOUNTER — Ambulatory Visit (INDEPENDENT_AMBULATORY_CARE_PROVIDER_SITE_OTHER): Payer: Medicaid Other

## 2018-08-07 ENCOUNTER — Telehealth: Payer: Self-pay

## 2018-08-07 DIAGNOSIS — E1141 Type 2 diabetes mellitus with diabetic mononeuropathy: Secondary | ICD-10-CM

## 2018-08-07 DIAGNOSIS — R0789 Other chest pain: Secondary | ICD-10-CM | POA: Diagnosis not present

## 2018-08-07 DIAGNOSIS — R55 Syncope and collapse: Secondary | ICD-10-CM

## 2018-08-07 DIAGNOSIS — I10 Essential (primary) hypertension: Secondary | ICD-10-CM

## 2018-08-07 DIAGNOSIS — E7800 Pure hypercholesterolemia, unspecified: Secondary | ICD-10-CM

## 2018-08-07 DIAGNOSIS — N183 Chronic kidney disease, stage 3 unspecified: Secondary | ICD-10-CM

## 2018-08-07 LAB — BASIC METABOLIC PANEL
BUN/Creatinine Ratio: 22 (ref 9–23)
BUN: 61 mg/dL — ABNORMAL HIGH (ref 6–24)
CO2: 21 mmol/L (ref 20–29)
Calcium: 8.7 mg/dL (ref 8.7–10.2)
Chloride: 101 mmol/L (ref 96–106)
Creatinine, Ser: 2.73 mg/dL — ABNORMAL HIGH (ref 0.57–1.00)
GFR calc Af Amer: 21 mL/min/{1.73_m2} — ABNORMAL LOW (ref >59)
GFR calc non Af Amer: 19 mL/min/{1.73_m2} — ABNORMAL LOW (ref >59)
Glucose: 79 mg/dL (ref 65–99)
Potassium: 5.4 mmol/L — ABNORMAL HIGH (ref 3.5–5.2)
Sodium: 134 mmol/L (ref 134–144)

## 2018-08-07 NOTE — Progress Notes (Unsigned)
2D Echocardiogram performed   08/07/18 Cardell Peach RDCS, RVT

## 2018-08-11 ENCOUNTER — Telehealth: Payer: Self-pay

## 2018-08-11 NOTE — Telephone Encounter (Signed)
-----   Message from Jenean Lindau, MD sent at 08/07/2018 12:39 PM EDT ----- The results of the study is unremarkable. Please inform patient. I will discuss in detail at next appointment. Cc  primary care/referring physician Jenean Lindau, MD 08/07/2018 12:39 PM

## 2018-08-11 NOTE — Telephone Encounter (Signed)
Left message informing results were ok for echo and copy sent to PCP. Patient informed to call office for further details. Copy sent to Dr. Spero Curb per Dr. Docia Furl request.

## 2018-08-20 ENCOUNTER — Encounter: Payer: Self-pay | Admitting: *Deleted

## 2018-08-20 NOTE — Telephone Encounter (Signed)
Telephone call to patient. Informed of stable lab results. Copy sent to PCP

## 2018-08-26 MED FILL — SPS 15 GM/60 ML SUSPENSION: 15 | 1 days supply | Qty: 120 | Fill #0

## 2018-08-28 ENCOUNTER — Telehealth: Payer: Self-pay

## 2018-08-28 NOTE — Telephone Encounter (Signed)
Patient called and notified of test results. 

## 2018-08-28 NOTE — Telephone Encounter (Signed)
-----   Message from Jenean Lindau, MD sent at 08/27/2018  7:40 PM EDT ----- The results of the study is unremarkable. Please inform patient. I will discuss in detail at next appointment. Cc  primary care/referring physician Jenean Lindau, MD 08/27/2018 7:39 PM

## 2018-10-31 ENCOUNTER — Emergency Department (HOSPITAL_COMMUNITY): Payer: Medicaid Other

## 2018-10-31 ENCOUNTER — Emergency Department (HOSPITAL_COMMUNITY)
Admission: EM | Admit: 2018-10-31 | Discharge: 2018-10-31 | Disposition: A | Discharge status: Home / Self Care | Payer: Medicaid Other | Attending: Emergency Medicine | Admitting: Emergency Medicine

## 2018-10-31 DIAGNOSIS — Z20828 Contact with and (suspected) exposure to other viral communicable diseases: Secondary | ICD-10-CM | POA: Insufficient documentation

## 2018-10-31 DIAGNOSIS — N183 Chronic kidney disease, stage 3 (moderate): Secondary | ICD-10-CM | POA: Insufficient documentation

## 2018-10-31 DIAGNOSIS — I129 Hypertensive chronic kidney disease with stage 1 through stage 4 chronic kidney disease, or unspecified chronic kidney disease: Secondary | ICD-10-CM | POA: Diagnosis not present

## 2018-10-31 DIAGNOSIS — Z79899 Other long term (current) drug therapy: Secondary | ICD-10-CM | POA: Insufficient documentation

## 2018-10-31 DIAGNOSIS — J069 Acute upper respiratory infection, unspecified: Secondary | ICD-10-CM | POA: Diagnosis not present

## 2018-10-31 DIAGNOSIS — Z794 Long term (current) use of insulin: Secondary | ICD-10-CM | POA: Diagnosis not present

## 2018-10-31 DIAGNOSIS — R55 Syncope and collapse: Secondary | ICD-10-CM | POA: Diagnosis present

## 2018-10-31 DIAGNOSIS — R252 Cramp and spasm: Secondary | ICD-10-CM | POA: Diagnosis not present

## 2018-10-31 DIAGNOSIS — R739 Hyperglycemia, unspecified: Secondary | ICD-10-CM

## 2018-10-31 DIAGNOSIS — J4541 Moderate persistent asthma with (acute) exacerbation: Secondary | ICD-10-CM | POA: Insufficient documentation

## 2018-10-31 DIAGNOSIS — E1165 Type 2 diabetes mellitus with hyperglycemia: Secondary | ICD-10-CM | POA: Insufficient documentation

## 2018-10-31 DIAGNOSIS — J45901 Unspecified asthma with (acute) exacerbation: Secondary | ICD-10-CM

## 2018-10-31 LAB — CBC WITH DIFFERENTIAL/PLATELET
Abs Immature Granulocytes: 0.08 10*3/uL — ABNORMAL HIGH (ref 0.00–0.07)
Basophils Absolute: 0 10*3/uL (ref 0.0–0.1)
Basophils Relative: 0 %
Eosinophils Absolute: 0.3 10*3/uL (ref 0.0–0.5)
Eosinophils Relative: 2 %
HCT: 31.5 % — ABNORMAL LOW (ref 36.0–46.0)
Hemoglobin: 10.9 g/dL — ABNORMAL LOW (ref 12.0–15.0)
Immature Granulocytes: 1 %
Lymphocytes Relative: 19 %
Lymphs Abs: 2.6 10*3/uL (ref 0.7–4.0)
MCH: 27.3 pg (ref 26.0–34.0)
MCHC: 34.6 g/dL (ref 30.0–36.0)
MCV: 78.9 fL — ABNORMAL LOW (ref 80.0–100.0)
Monocytes Absolute: 0.6 10*3/uL (ref 0.1–1.0)
Monocytes Relative: 5 %
Neutro Abs: 9.7 10*3/uL — ABNORMAL HIGH (ref 1.7–7.7)
Neutrophils Relative %: 73 %
Platelets: 292 10*3/uL (ref 150–400)
RBC: 3.99 MIL/uL (ref 3.87–5.11)
RDW: 13.5 % (ref 11.5–15.5)
WBC: 13.2 10*3/uL — ABNORMAL HIGH (ref 4.0–10.5)
nRBC: 0 % (ref 0.0–0.2)

## 2018-10-31 LAB — COMPREHENSIVE METABOLIC PANEL
ALT: 19 U/L (ref 0–44)
AST: 16 U/L (ref 15–41)
Albumin: 3.5 g/dL (ref 3.5–5.0)
Alkaline Phosphatase: 209 U/L — ABNORMAL HIGH (ref 38–126)
Anion gap: 11 (ref 5–15)
BUN: 39 mg/dL — ABNORMAL HIGH (ref 6–20)
CO2: 20 mmol/L — ABNORMAL LOW (ref 22–32)
Calcium: 8.8 mg/dL — ABNORMAL LOW (ref 8.9–10.3)
Chloride: 103 mmol/L (ref 98–111)
Creatinine, Ser: 2.81 mg/dL — ABNORMAL HIGH (ref 0.44–1.00)
GFR calc Af Amer: 21 mL/min — ABNORMAL LOW (ref >60)
GFR calc non Af Amer: 18 mL/min — ABNORMAL LOW (ref >60)
Glucose, Bld: 386 mg/dL — ABNORMAL HIGH (ref 70–99)
Potassium: 5 mmol/L (ref 3.5–5.1)
Sodium: 134 mmol/L — ABNORMAL LOW (ref 135–145)
Total Bilirubin: 0.4 mg/dL (ref 0.3–1.2)
Total Protein: 7.1 g/dL (ref 6.5–8.1)

## 2018-10-31 LAB — LIPASE, BLOOD: Lipase: 42 U/L (ref 11–51)

## 2018-10-31 LAB — CBG MONITORING, ED: Glucose-Capillary: 380 mg/dL — ABNORMAL HIGH (ref 70–99)

## 2018-10-31 LAB — SARS CORONAVIRUS 2 BY RT PCR (HOSPITAL ORDER, PERFORMED IN ~~LOC~~ HOSPITAL LAB): SARS Coronavirus 2: NEGATIVE

## 2018-10-31 MED ORDER — LORAZEPAM 2 MG/ML IJ SOLN
1.0000 mg | Freq: Once | INTRAMUSCULAR | Status: AC
  Administered 2018-10-31: 20:00:00 1 mg via INTRAVENOUS
  Filled 2018-10-31: qty 1

## 2018-10-31 MED ORDER — LORAZEPAM 2 MG/ML IJ SOLN
0.5000 mg | Freq: Once | INTRAMUSCULAR | Status: AC
  Administered 2018-10-31: 0.5 mg via INTRAVENOUS
  Filled 2018-10-31: qty 1

## 2018-10-31 MED ORDER — DEXAMETHASONE SODIUM PHOSPHATE 10 MG/ML IJ SOLN
10.0000 mg | Freq: Four times a day (QID) | INTRAMUSCULAR | Status: DC
  Administered 2018-10-31: 10 mg via INTRAVENOUS
  Filled 2018-10-31: qty 1

## 2018-10-31 MED ORDER — HYDRALAZINE HCL 20 MG/ML IJ SOLN
10.0000 mg | Freq: Once | INTRAMUSCULAR | Status: AC
  Administered 2018-10-31: 10 mg via INTRAVENOUS
  Filled 2018-10-31: qty 1

## 2018-10-31 MED ORDER — INSULIN ASPART 100 UNIT/ML IV SOLN
10.0000 [IU] | Freq: Once | INTRAVENOUS | Status: AC
  Administered 2018-10-31: 15:00:00 10 [IU] via INTRAVENOUS
  Filled 2018-10-31: qty 0.1

## 2018-10-31 MED ORDER — MORPHINE SULFATE (PF) 4 MG/ML IV SOLN
4.0000 mg | Freq: Once | INTRAVENOUS | Status: AC
  Administered 2018-10-31: 4 mg via INTRAVENOUS
  Filled 2018-10-31: qty 1

## 2018-10-31 MED ORDER — DOXYCYCLINE HYCLATE 100 MG PO CAPS: 100.0000 mg | ORAL_CAPSULE | Freq: Two times a day (BID) | ORAL | 0 refills | Status: DC

## 2018-10-31 MED ORDER — PREDNISONE 20 MG PO TABS: ORAL_TABLET | ORAL | 0 refills | Status: DC

## 2018-10-31 MED ORDER — BENZONATATE 100 MG PO CAPS: 100.0000 mg | ORAL_CAPSULE | Freq: Three times a day (TID) | ORAL | 0 refills | Status: DC

## 2018-10-31 MED ORDER — ALBUTEROL (5 MG/ML) CONTINUOUS INHALATION SOLN
10.0000 mg/h | INHALATION_SOLUTION | RESPIRATORY_TRACT | Status: DC
  Administered 2018-10-31: 10 mg/h via RESPIRATORY_TRACT
  Filled 2018-10-31: qty 20

## 2018-10-31 MED ORDER — DIAZEPAM 5 MG PO TABS: 5.0000 mg | ORAL_TABLET | Freq: Two times a day (BID) | ORAL | 0 refills | Status: DC | PRN

## 2018-10-31 NOTE — ED Triage Notes (Signed)
Pt to ED via EMS from home. Pt called EMS d/t syncopal episode. Orientation at baseline. C/O cough, n&v- reports has not been around anyone with COVID that she is aware of.  HX DM, cbg 421. Hx HTN. #18 LAC 250 ml NS bolus given. EMS reports wheezing right lungs. NSR.

## 2018-10-31 NOTE — ED Provider Notes (Signed)
[facility] DEPT Provider Note   CSN: [id number] Arrival date & time: 10/31/18  1402     History   Chief Complaint Chief Complaint  Patient presents with  . Near Syncope  . Cough  . Hyperglycemia  . Hypertension    HPI [name] [name] is a 58 y.o. female.     HPI History is limited due to acute condition and patient with voice changes.  Level 5 caveat applies.  Says she has a history of asthma.  Started having difficulty breathing roughly 3 hours prior to presentation.  Not related to choking.  States she feels like she is having tightness to her throat and chest.  No recent fever or chills.  No new medications.  No new rashes.   Past Medical History:  Diagnosis Date  . Anemia   . Anxiety   . Arthritis    "hips" (08/28/2013)  . Chronic lower back pain    10-27-13 not a problem at this time  . Depression   . Esophageal spasm   . Frequent UTI   . GERD (gastroesophageal reflux disease)   . Hepatitis C    genotype 1B  . High cholesterol   . Hypertension   . Migraine    "monthly" (08/28/2013)  . Pneumonia    "several times"  . Type II diabetes mellitus ([place])   . Ulcer    hx gastric ulcers    Patient Active Problem List   Diagnosis Date Noted  . AKI (acute kidney injury) ([place]) 11/15/2016  . Iron deficiency anemia due to chronic blood loss 11/15/2016  . Vitamin D deficiency 11/15/2016  . CKD (chronic kidney disease), stage III ([place]) 10/17/2016  . Hyperkalemia 10/17/2016  . Microscopic hematuria 10/17/2016  . Proteinuria 10/17/2016  . Acute gastric ulcer without hemorrhage or perforation   . Occult blood in stools 08/13/2016  . GI bleed due to NSAIDs 08/13/2016  . Abdominal pain 08/13/2016  . Need for influenza vaccination 12/26/2015  . Diabetic neuropathy associated with type 2 diabetes mellitus ([place]) 12/25/2015  . Leg cramps, sleep related 12/05/2015  . Hypercholesteremia 11/09/2015  . Globus sensation 06/14/2014  . Abnormal EKG  08/29/2013  . Hypertensive urgency 08/28/2013  . Chest pain 08/28/2013  . GERD (gastroesophageal reflux disease) 07/17/2013  . Diarrhea 07/17/2013  . Abdominal pain, unspecified site 07/17/2013  . DM (diabetes mellitus), type 2 with neurological complications ([place]) 123XX123  . Atypical chest pain 03/09/2009  . Dysphagia 03/09/2009  . NAUSEA AND VOMITING 12/20/2008  . HEPATITIS C, CHRONIC 03/10/2008  . Essential hypertension 03/10/2008  . BRONCHITIS, CHRONIC 03/10/2008  . Dyskinesia of esophagus 03/10/2008  . ARTHRITIS 03/10/2008    Past Surgical History:  Procedure Laterality Date  . ABDOMINAL HYSTERECTOMY    . APPENDECTOMY    . BALLOON DILATION N/A 12/29/2012   Procedure: BALLOON DILATION;  Surgeon: Inda Castle, MD;  Location: [facility];  Service: Endoscopy;  Laterality: N/A;  . BALLOON DILATION N/A 06/25/2013   Procedure: BALLOON DILATION;  Surgeon: Inda Castle, MD;  Location: [facility] ENDOSCOPY;  Service: Endoscopy;  Laterality: N/A;  . BOTOX INJECTION  12/29/2012   Procedure: BOTOX INJECTION;  Surgeon: Inda Castle, MD;  Location: [facility] ENDOSCOPY;  Service: Endoscopy;;  . BOTOX INJECTION N/A 06/25/2013   Procedure: BOTOX INJECTION;  Surgeon: Inda Castle, MD;  Location: [facility] ENDOSCOPY;  Service: Endoscopy;  Laterality: N/A;  . CARDIAC CATHETERIZATION  X 2   /notes 08/28/2013  . CARPAL TUNNEL RELEASE Right   .  CHOLECYSTECTOMY    . COLONOSCOPY  04/10/2012   Normal   . ESOPHAGEAL MANOMETRY N/A 12/26/2015   Procedure: ESOPHAGEAL MANOMETRY (EM);  Surgeon: Doran Stabler, MD;  Location: [facility] ENDOSCOPY;  Service: Gastroenterology;  Laterality: N/A;  . ESOPHAGOGASTRODUODENOSCOPY N/A 12/29/2012   Procedure: ESOPHAGOGASTRODUODENOSCOPY (EGD);  Surgeon: Inda Castle, MD;  Location: [facility];  Service: Endoscopy;  Laterality: N/A;  . ESOPHAGOGASTRODUODENOSCOPY N/A 06/25/2013   Procedure: ESOPHAGOGASTRODUODENOSCOPY (EGD);  Surgeon: Inda Castle, MD;  Location: [facility]  ENDOS[facility];  Service: Endoscopy;  Laterality: N/A;  . ESOPHAGOGASTRODUODENOSCOPY N/A 11/03/2013   Procedure: ESOPHAGOGASTRODUODENOSCOPY (EGD);  Surgeon: Inda Castle, MD;  Location: [facility];  Service: Endoscopy;  Laterality: N/A;  Botox - ballon dilation  . ESOPHAGOGASTRODUODENOSCOPY N/A 08/14/2016   Procedure: ESOPHAGOGASTRODUODENOSCOPY (EGD);  Surgeon: Milus Banister, MD;  Location: [facility] ENDOSCOPY;  Service: Endoscopy;  Laterality: N/A;  . FOOT FRACTURE SURGERY Right   . HEEL SPUR EXCISION Right   . TONSILLECTOMY    . TUBAL LIGATION       OB History   No obstetric history on file.      Home Medications    Prior to Admission medications   Medication Sig Start Date End Date Taking? Authorizing Provider  acetaminophen (TYLENOL) 500 MG tablet Take 1,000 mg by mouth every 4 (four) hours as needed.    Yes [provider]  albuterol (PROAIR HFA) 108 (90 Base) MCG/ACT inhaler Inhale 1-2 puffs into the lungs every 4 (four) hours as needed for wheezing or shortness of breath.    Yes [provider]  cloNIDine (CATAPRES) 0.1 MG tablet Take 0.1 mg by mouth 2 (two) times daily.    Yes [provider]  cyclobenzaprine (FLEXERIL) 5 MG tablet Take 5 mg by mouth 4 (four) times daily as needed for muscle spasms.    Yes [provider]  Ferrous Sulfate (IRON) 325 (65 Fe) MG TABS Take 325 mg by mouth daily.    Yes [provider]  gabapentin (NEURONTIN) 300 MG capsule Take 300 mg by mouth at bedtime. 07/26/18  Yes [provider]  HUMULIN N 100 UNIT/ML injection Inject 14-27 Units into the skin See admin instructions. Inject 23-27 units in the morning, 14-17 units in the evening start with lower doses first. 06/26/18  Yes [provider]  hydrOXYzine (ATARAX/VISTARIL) 25 MG tablet Take 25-50 mg by mouth every 8 (eight) hours as needed. 07/13/18  Yes [provider]  insulin lispro (HUMALOG) 100 UNIT/ML injection Inject 20 Units into  the skin 2 (two) times daily.    Yes [provider]  labetalol (NORMODYNE) 300 MG tablet Take 300 mg by mouth 2 (two) times daily. 06/21/18  Yes [provider]  nitroGLYCERIN (NITROSTAT) 0.4 MG SL tablet Place 0.4 mg under the tongue every 5 (five) minutes as needed for chest pain.   Yes [provider]  sodium bicarbonate 650 MG tablet Take 650 mg by mouth 2 (two) times daily. 07/11/18  Yes [provider]  Vitamin D, Ergocalciferol, (DRISDOL) 50000 units CAPS capsule Take 50,000 Units by mouth every 7 (seven) days.  10/22/16  Yes [provider]  benzonatate (TESSALON) 100 MG capsule Take 1 capsule (100 mg total) by mouth every 8 (eight) hours. 10/31/18   Julianne Rice, MD  chlorthalidone (HYGROTON) 25 MG tablet Take 1 tablet (25 mg total) by mouth daily. Patient not taking: Reported on 07/10/2018 06/09/18   Revankar, Reita Cliche, MD  diazepam (VALIUM) 5 MG tablet  Take 1 tablet (5 mg total) by mouth every 12 (twelve) hours as needed for muscle spasms. 10/31/18   Julianne Rice, MD  doxycycline (VIBRAMYCIN) 100 MG capsule Take 1 capsule (100 mg total) by mouth 2 (two) times daily. One po bid x 7 days 10/31/18   Julianne Rice, MD  gabapentin (NEURONTIN) 100 MG capsule Take 1 capsule (100 mg total) by mouth 3 (three) times daily. Patient taking differently: Take 100 mg by mouth 2 (two) times daily.  10/01/17   Hyatt, Max T, DPM  omeprazole (PRILOSEC) 20 MG capsule Take 1 capsule (20 mg total) by mouth 2 (two) times daily before a meal. Patient taking differently: Take 20 mg by mouth daily.  10/11/16   Doran Stabler, MD  predniSONE (DELTASONE) 20 MG tablet 3 tabs po day one, then 2 po daily x 4 days 11/01/18   Julianne Rice, MD  traMADol (ULTRAM) 50 MG tablet Take 1 tablet (50 mg total) by mouth every 6 (six) hours as needed for moderate pain. Patient not taking: Reported on 10/31/2018 08/15/16   Jani Gravel, MD    Family History Family History  Problem  Relation Age of Onset  . Hypertension Mother   . Alzheimer's disease Father   . Throat cancer Sister 13       died at age 37  . Colon cancer Neg Hx   . Rectal cancer Neg Hx   . Stomach cancer Neg Hx     Social History Social History   Tobacco Use  . Smoking status: Never Smoker  . Smokeless tobacco: Never Used  Substance Use Topics  . Alcohol use: No  . Drug use: No     Allergies   Hydrochlorothiazide, Iodinated diagnostic agents, Lisinopril, Metformin, Codeine, and Dilaudid [hydromorphone hcl]   Review of Systems Review of Systems  Unable to perform ROS: Acuity of condition     Physical Exam Updated Vital Signs BP (!) 196/108   Pulse 81   Temp 98.2 F (36.8 C) (Oral)   Resp 20   SpO2 99%   Physical Exam Vitals signs and nursing note reviewed.  Constitutional:      General: She is in acute distress.     Appearance: She is well-developed. She is obese.  HENT:     Head: Normocephalic and atraumatic.     Mouth/Throat:     Mouth: Mucous membranes are moist.     Comments: No facial or intraoral swelling.  Visualize posterior pharynx without abnormality. Eyes:     Pupils: Pupils are equal, round, and reactive to light.  Neck:     Musculoskeletal: Normal range of motion and neck supple. No neck rigidity or muscular tenderness.     Comments: Course expiratory sounds appreciated but no inspiratory stridor. Cardiovascular:     Rate and Rhythm: Normal rate and regular rhythm.     Heart sounds: No murmur. No friction rub. No gallop.   Pulmonary:     Effort: Pulmonary effort is normal.     Breath sounds: Wheezing present.     Comments: Coarse expiratory wheezes in all lung fields. Abdominal:     General: Bowel sounds are normal.     Palpations: Abdomen is soft.     Tenderness: There is no abdominal tenderness. There is no guarding or rebound.  Musculoskeletal: Normal range of motion.        General: No tenderness.  Lymphadenopathy:     Cervical: No cervical  adenopathy.  Skin:    General:  Skin is warm and dry.     Findings: No erythema or rash.  Neurological:     Mental Status: She is alert and oriented to person, place, and time.  Psychiatric:        Behavior: Behavior normal.      ED Treatments / Results  Labs (all labs ordered are listed, but only abnormal results are displayed) Labs Reviewed  CBC WITH DIFFERENTIAL/PLATELET - Abnormal; Notable for the following components:      Result Value   WBC 13.2 (*)    Hemoglobin 10.9 (*)    HCT 31.5 (*)    MCV 78.9 (*)    Neutro Abs 9.7 (*)    Abs Immature Granulocytes 0.08 (*)    All other components within normal limits  COMPREHENSIVE METABOLIC PANEL - Abnormal; Notable for the following components:   Sodium 134 (*)    CO2 20 (*)    Glucose, Bld 386 (*)    BUN 39 (*)    Creatinine, Ser 2.81 (*)    Calcium 8.8 (*)    Alkaline Phosphatase 209 (*)    GFR calc non Af Amer 18 (*)    GFR calc Af Amer 21 (*)    All other components within normal limits  CBG MONITORING, ED - Abnormal; Notable for the following components:   Glucose-Capillary 380 (*)    All other components within normal limits  SARS CORONAVIRUS 2 (HOSPITAL ORDER, [facility])  LIPASE, BLOOD    EKG EKG Interpretation  Date/Time:  Friday October 31 2018 14:22:37 EDT Ventricular Rate:  86 PR Interval:    QRS Duration: 85 QT Interval:  376 QTC Calculation: 450 R Axis:   23 Text Interpretation:  Sinus rhythm Abnormal T, consider ischemia, lateral leads Minimal ST elevation, anterior leads Confirmed by Julianne Rice [id number]) on 10/31/2018 3:14:11 PM   Radiology Dg Neck Soft Tissue  Result Date: 10/31/2018 CLINICAL DATA:  Cough, feels like something is stuck in her throat EXAM: NECK SOFT TISSUES - 1+ VIEW COMPARISON:  None. FINDINGS: There is no evidence of retropharyngeal soft tissue swelling or epiglottic enlargement. There is a 5 mm calcification seen posterior to the piriform sinus,  likely cartilaginous. Soft tissues are otherwise unremarkable. IMPRESSION: No acute abnormality. Electronically Signed   By: Prudencio Pair M.D.   On: 10/31/2018 19:15   Dg Chest Port 1 View  Result Date: 10/31/2018 CLINICAL DATA:  Cough, nausea vomiting and abdominal with syncope EXAM: PORTABLE CHEST 1 VIEW COMPARISON:  Portable chest 09/10/2017 FINDINGS: Study limited by portable technique and patient body habitus. Lung volumes are low with accentuation of cardiac silhouette. No signs of dense consolidation. Question interstitial prominence. Or clear evidence of effusion accounting for technical factors. No signs of acute bone finding. IMPRESSION: Low volume chest with question of interstitial prominence. Electronically Signed   By: Zetta Bills M.D.   On: 10/31/2018 16:43    Procedures Procedures (including critical care time)  Medications Ordered in ED Medications  dexamethasone (DECADRON) injection 10 mg (10 mg Intravenous Given 10/31/18 1744)  albuterol (PROVENTIL,VENTOLIN) solution continuous neb (10 mg/hr Nebulization New Bag/Given 10/31/18 1512)  hydrALAZINE (APRESOLINE) injection 10 mg (10 mg Intravenous Given 10/31/18 1516)  insulin aspart (novoLOG) injection 10 Units (10 Units Intravenous Given 10/31/18 1518)  LORazepam (ATIVAN) injection 0.5 mg (0.5 mg Intravenous Given 10/31/18 1517)  morphine 4 MG/ML injection 4 mg (4 mg Intravenous Given 10/31/18 1639)  LORazepam (ATIVAN) injection 1 mg (1 mg Intravenous Given  10/31/18 1937)   CRITICAL CARE Performed by: Julianne Rice Total critical care time: 40 minutes Critical care time was exclusive of separately billable procedures and treating other patients. Critical care was necessary to treat or prevent imminent or life-threatening deterioration. Critical care was time spent personally by me on the following activities: development of treatment plan with patient and/or surrogate as well as nursing, discussions with consultants, evaluation  of patient's response to treatment, examination of patient, obtaining history from patient or surrogate, ordering and performing treatments and interventions, ordering and review of laboratory studies, ordering and review of radiographic studies, pulse oximetry and re-evaluation of patient's condition.  Initial Impression / Assessment and Plan / ED Course  I have reviewed the triage vital signs and the nursing notes.  Pertinent labs & imaging results that were available during my care of the patient were reviewed by me and considered in my medical decision making (see chart for details).        Concern for possible asthma exacerbation and upper airway restriction.  Possibly due to laryngeal spasm.  Will start Decadron, continuous nebulized treatment as well as a small dose of Ativan.  Symptoms seem to be exacerbated when patient becomes anxious.  Will observe very closely.  Should symptoms progress will likely need definitive airway.   Patient is breathing much better after nebulized treatment.  Speaking in full sentences.  Voice is for the most part clear.  She now is complaining of " spasms in her abdomen and low back".  Review of patient's chart shows that she is followed by pain management for similar symptoms.  Will give dose of pain medication here but abdominal exam is benign.  Abdominal and back spasms have improved.  Patient has some mild diffuse expiratory wheezes but overall appears well.  Will give short course of prednisone.  Given concern for possible URI and asthma exacerbation will start on antibiotic.  Patient been given strict return precautions and is voiced understanding. Final Clinical Impressions(s) / ED Diagnoses   Final diagnoses:  Upper respiratory tract infection, unspecified type  Moderate asthma with exacerbation, unspecified whether persistent  Muscle cramps  Hyperglycemia    ED Discharge Orders         Ordered    predniSONE (DELTASONE) 20 MG tablet      10/31/18 1958    benzonatate (TESSALON) 100 MG capsule  Every 8 hours     10/31/18 1958    doxycycline (VIBRAMYCIN) 100 MG capsule  2 times daily     10/31/18 1958    diazepam (VALIUM) 5 MG tablet  Every 12 hours PRN     10/31/18 1958           Julianne Rice, MD 10/31/18 2000

## 2018-10-31 NOTE — ED Notes (Signed)
EDP at bedside  

## 2018-10-31 NOTE — ED Notes (Addendum)
Call received from pt husband Lanny Hurst [id number] [phone] requesting rtn call for pt status update as soon as possible. Per RN instruction- advised pt husband that pt is currently being assessed by providers; will contact once updates available. Pt husband acknowledged/confirmed understanding. RN advised of conversation. [employer]

## 2018-10-31 NOTE — ED Notes (Signed)
Limited assessment d/t pt cough/voice so hoarse unable to comprehend answers to questions. Pt able to nod head yes and no appropriately to questions when asked.

## 2018-10-31 NOTE — ED Notes (Addendum)
Visitor at bedside , updated on POC .

## 2018-10-31 NOTE — ED Notes (Signed)
RT at beside.

## 2018-10-31 NOTE — ED Notes (Signed)
Pt is respiratory distress, 2L o2 [place] applied for comfort-  EDP [place] made aware .

## 2018-10-31 NOTE — ED Notes (Signed)
Pt yelling, sitting on side of bed, c/o abdominal pain, holding abdomen, in obvious distress. This nurse notified EDP [place].

## 2018-12-01 ENCOUNTER — Telehealth: Payer: Self-pay | Admitting: Podiatry

## 2018-12-03 NOTE — Telephone Encounter (Signed)
This is Cytogeneticist with [employer]. I'm calling to see if you received the request for medical records. Please call us back and reference MR number [id number] or send [place] a fax to [phone].

## 2018-12-03 NOTE — Telephone Encounter (Signed)
Called and let them know I have not received a fax request for medical records. Asked them to fax it to me at [phone].

## 2018-12-05 NOTE — Telephone Encounter (Signed)
Called and spoke with Murray Hodgkins at [employer]. Told her I received the request via mail yesterday and the fax request today. Told her I faxed the requested medial records earlier via Epic our EMR system. Murray Hodgkins stated she would get it noted.

## 2018-12-05 NOTE — Telephone Encounter (Signed)
This is Frankey Poot with [employer]. Calling to check on the status of the records request under [id number]. Please call us back at [phone] and choose option 2. Thank you.

## 2019-01-01 DIAGNOSIS — M533 Sacrococcygeal disorders, not elsewhere classified: Secondary | ICD-10-CM | POA: Insufficient documentation

## 2019-01-19 ENCOUNTER — Ambulatory Visit: Payer: Medicaid Other | Admitting: Podiatry

## 2019-02-17 ENCOUNTER — Encounter: Payer: Self-pay | Admitting: Podiatry

## 2019-02-17 ENCOUNTER — Ambulatory Visit: Payer: Medicaid Other

## 2019-02-17 ENCOUNTER — Ambulatory Visit: Payer: Medicaid Other | Admitting: Podiatry

## 2019-02-17 ENCOUNTER — Other Ambulatory Visit: Payer: Self-pay

## 2019-02-17 DIAGNOSIS — D631 Anemia in chronic kidney disease: Secondary | ICD-10-CM | POA: Insufficient documentation

## 2019-02-17 DIAGNOSIS — S322XXA Fracture of coccyx, initial encounter for closed fracture: Secondary | ICD-10-CM | POA: Insufficient documentation

## 2019-02-17 DIAGNOSIS — M722 Plantar fascial fibromatosis: Secondary | ICD-10-CM

## 2019-02-17 DIAGNOSIS — Z8619 Personal history of other infectious and parasitic diseases: Secondary | ICD-10-CM | POA: Insufficient documentation

## 2019-02-17 DIAGNOSIS — M545 Low back pain, unspecified: Secondary | ICD-10-CM | POA: Insufficient documentation

## 2019-02-17 DIAGNOSIS — I129 Hypertensive chronic kidney disease with stage 1 through stage 4 chronic kidney disease, or unspecified chronic kidney disease: Secondary | ICD-10-CM

## 2019-02-17 DIAGNOSIS — S92919A Unspecified fracture of unspecified toe(s), initial encounter for closed fracture: Secondary | ICD-10-CM | POA: Insufficient documentation

## 2019-02-17 DIAGNOSIS — E669 Obesity, unspecified: Secondary | ICD-10-CM | POA: Insufficient documentation

## 2019-02-17 DIAGNOSIS — M7542 Impingement syndrome of left shoulder: Secondary | ICD-10-CM

## 2019-02-17 DIAGNOSIS — F32A Depression, unspecified: Secondary | ICD-10-CM | POA: Insufficient documentation

## 2019-02-17 DIAGNOSIS — E1142 Type 2 diabetes mellitus with diabetic polyneuropathy: Secondary | ICD-10-CM

## 2019-02-17 DIAGNOSIS — N186 End stage renal disease: Secondary | ICD-10-CM | POA: Insufficient documentation

## 2019-02-17 DIAGNOSIS — R319 Hematuria, unspecified: Secondary | ICD-10-CM | POA: Insufficient documentation

## 2019-02-17 DIAGNOSIS — M7750 Other enthesopathy of unspecified foot: Secondary | ICD-10-CM

## 2019-02-17 DIAGNOSIS — M778 Other enthesopathies, not elsewhere classified: Secondary | ICD-10-CM

## 2019-02-17 DIAGNOSIS — F329 Major depressive disorder, single episode, unspecified: Secondary | ICD-10-CM | POA: Insufficient documentation

## 2019-02-17 DIAGNOSIS — M25519 Pain in unspecified shoulder: Secondary | ICD-10-CM | POA: Insufficient documentation

## 2019-02-17 DIAGNOSIS — G8929 Other chronic pain: Secondary | ICD-10-CM | POA: Insufficient documentation

## 2019-02-17 HISTORY — DX: Fracture of coccyx, initial encounter for closed fracture: S32.2XXA

## 2019-02-17 HISTORY — DX: Unspecified fracture of unspecified toe(s), initial encounter for closed fracture: S92.919A

## 2019-02-17 HISTORY — DX: Impingement syndrome of left shoulder: M75.42

## 2019-02-17 HISTORY — DX: End stage renal disease: N18.6

## 2019-02-17 HISTORY — DX: Personal history of other infectious and parasitic diseases: Z86.19

## 2019-02-17 HISTORY — DX: Hypertensive chronic kidney disease with stage 1 through stage 4 chronic kidney disease, or unspecified chronic kidney disease: I12.9

## 2019-02-17 MED ORDER — GABAPENTIN 300 MG PO CAPS: 300.0000 mg | ORAL_CAPSULE | Freq: Three times a day (TID) | ORAL | 3 refills | Status: DC

## 2019-02-18 NOTE — Progress Notes (Signed)
She presents today for follow-up of her plantar fasciitis capsulitis diabetic peripheral neuropathy states that she is been having a lot of pain so she run out of her gabapentin.  She states that I prescribed her 100 mg of gabapentin and her primary doctor had gone back and prescribed 300 mg nightly.  But she ran out of the medication now her feet of been bothering her.  States that nothing was hurting while she was taking the medicine.  Objective: Vital signs are stable she is alert oriented x3 pulses are strongly palpable.  There is no edema erythema cellulitis drainage odor though she does have pain on palpation of the sinus tarsi bilaterally.  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  Plan: She refused injections today so we did just refill her gabapentin 300 mg #91 p.o. nightly with three refills.

## 2019-04-30 ENCOUNTER — Other Ambulatory Visit: Payer: Self-pay | Admitting: *Deleted

## 2019-04-30 ENCOUNTER — Telehealth (HOSPITAL_COMMUNITY): Payer: Self-pay

## 2019-04-30 DIAGNOSIS — N186 End stage renal disease: Secondary | ICD-10-CM

## 2019-04-30 NOTE — Telephone Encounter (Signed)
The above patient or their representative was contacted and gave the following answers to these questions:         Do you have any of the following symptoms?    NO  Fever                    Cough                   Shortness of breath  Do  you have any of the following other symptoms?    muscle pain         vomiting,        diarrhea        rash         weakness        red eye        abdominal pain         bruising          bruising or bleeding              joint pain           severe headache    Have you been in contact with someone who was or has been sick in the past 2 weeks?  NO  Yes                 Unsure                         Unable to assess   Does the person that you were in contact with have any of the following symptoms?   Cough         shortness of breath           muscle pain         vomiting,            diarrhea            rash            weakness           fever            red eye           abdominal pain           bruising  or  bleeding                joint pain                severe headache                 COMMENTS OR ACTION PLAN FOR THIS PATIENT:        ALL QUESTIONS WERE ANSWERED/[facility] PT IS FULLY VACCINATED/[facility]  

## 2019-05-01 ENCOUNTER — Ambulatory Visit (HOSPITAL_COMMUNITY)
Admission: RE | Admit: 2019-05-01 | Discharge: 2019-05-01 | Disposition: A | Discharge status: Home / Self Care | Payer: Medicaid Other | Source: Ambulatory Visit | Attending: Vascular Surgery | Admitting: Vascular Surgery

## 2019-05-01 ENCOUNTER — Ambulatory Visit (INDEPENDENT_AMBULATORY_CARE_PROVIDER_SITE_OTHER)
Admission: RE | Admit: 2019-05-01 | Discharge: 2019-05-01 | Disposition: A | Discharge status: Home / Self Care | Payer: Medicaid Other | Source: Ambulatory Visit | Attending: Vascular Surgery | Admitting: Vascular Surgery

## 2019-05-01 ENCOUNTER — Ambulatory Visit (INDEPENDENT_AMBULATORY_CARE_PROVIDER_SITE_OTHER): Payer: Medicaid Other | Admitting: Vascular Surgery

## 2019-05-01 ENCOUNTER — Other Ambulatory Visit: Payer: Self-pay

## 2019-05-01 ENCOUNTER — Encounter: Payer: Self-pay | Admitting: Vascular Surgery

## 2019-05-01 VITALS — BP 137/73 | HR 69 | Temp 97.9°F | Resp 20 | Ht 65.0 in | Wt 270.0 lb

## 2019-05-01 DIAGNOSIS — N186 End stage renal disease: Secondary | ICD-10-CM

## 2019-05-01 DIAGNOSIS — N184 Chronic kidney disease, stage 4 (severe): Secondary | ICD-10-CM | POA: Diagnosis not present

## 2019-05-01 NOTE — Progress Notes (Deleted)
Patient ID: [name] [name], female   DOB: 30-Dec-1960, 59 y.o.   MRN: [id number]  Reason for Consult: New Patient (Initial Visit)   Referred by [name] Sanes, MD  Subjective:     HPI:  [name] [name] is a 59 y.o. female with chronic kidney disease.  She has never been on dialysis.  She is right-hand dominant.  She denies any history of port placement or chest breast or upper extremity surgery.  She presents today for evaluation for dialysis access.  She does not take blood thinners.  Past Medical History:  Diagnosis Date  . Anemia   . Anxiety   . Arthritis    "hips" (08/28/2013)  . Chronic lower back pain    10-27-13 not a problem at this time  . Depression   . Esophageal spasm   . Frequent UTI   . GERD (gastroesophageal reflux disease)   . Hepatitis C    genotype 1B  . High cholesterol   . Hypertension   . Migraine    "monthly" (08/28/2013)  . Pneumonia    "several times"  . Type II diabetes mellitus ([place])   . Ulcer    hx gastric ulcers   Family History  Problem Relation Age of Onset  . Hypertension Mother   . Alzheimer's disease Father   . Throat cancer Sister 24       died at age 44  . Colon cancer Neg Hx   . Rectal cancer Neg Hx   . Stomach cancer Neg Hx    Past Surgical History:  Procedure Laterality Date  . ABDOMINAL HYSTERECTOMY    . APPENDECTOMY    . BALLOON DILATION N/A 12/29/2012   Procedure: BALLOON DILATION;  Surgeon: Inda Castle, MD;  Location: [facility];  Service: Endoscopy;  Laterality: N/A;  . BALLOON DILATION N/A 06/25/2013   Procedure: BALLOON DILATION;  Surgeon: Inda Castle, MD;  Location: [facility];  Service: Endoscopy;  Laterality: N/A;  . BOTOX INJECTION  12/29/2012   Procedure: BOTOX INJECTION;  Surgeon: Inda Castle, MD;  Location: [facility];  Service: Endoscopy;;  . BOTOX INJECTION N/A 06/25/2013   Procedure: BOTOX INJECTION;  Surgeon: Inda Castle, MD;  Location: [facility];  Service: Endoscopy;  Laterality: N/A;    . CARDIAC CATHETERIZATION  X 2   /notes 08/28/2013  . CARPAL TUNNEL RELEASE Right   . CHOLECYSTECTOMY    . COLONOSCOPY  04/10/2012   Normal   . ESOPHAGEAL MANOMETRY N/A 12/26/2015   Procedure: ESOPHAGEAL MANOMETRY (EM);  Surgeon: Doran Stabler, MD;  Location: [facility];  Service: Gastroenterology;  Laterality: N/A;  . ESOPHAGOGASTRODUODENOSCOPY N/A 12/29/2012   Procedure: ESOPHAGOGASTRODUODENOSCOPY (EGD);  Surgeon: Inda Castle, MD;  Location: [facility];  Service: Endoscopy;  Laterality: N/A;  . ESOPHAGOGASTRODUODENOSCOPY N/A 06/25/2013   Procedure: ESOPHAGOGASTRODUODENOSCOPY (EGD);  Surgeon: Inda Castle, MD;  Location: [facility];  Service: Endoscopy;  Laterality: N/A;  . ESOPHAGOGASTRODUODENOSCOPY N/A 11/03/2013   Procedure: ESOPHAGOGASTRODUODENOSCOPY (EGD);  Surgeon: Inda Castle, MD;  Location: [facility];  Service: Endoscopy;  Laterality: N/A;  Botox - ballon dilation  . ESOPHAGOGASTRODUODENOSCOPY N/A 08/14/2016   Procedure: ESOPHAGOGASTRODUODENOSCOPY (EGD);  Surgeon: Milus Banister, MD;  Location[facility];  Service: Endoscopy;  Laterality: N/A;  . FOOT FRACTURE SURGERY Right   . HEEL SPUR EXCISION Right   . TONSILLECTOMY    . TUBAL LIGATION      Short Social History:  Social History   Tobacco Use  .  Smoking status: Never Smoker  . Smokeless tobacco: Never Used  Substance Use Topics  . Alcohol use: No    Allergies  Allergen Reactions  . Hydrochlorothiazide Other (See Comments)  . Iodinated Diagnostic Agents   . Lisinopril   . Metformin   . Codeine Itching    No hives or rash, takes benadryl  . Dilaudid [Hydromorphone Hcl] Itching    "mild itching hrs after dilaudid given"    Current Outpatient Medications  Medication Sig Dispense Refill  . acetaminophen (TYLENOL) 500 MG tablet Take 1,000 mg by mouth every 4 (four) hours as needed.     Marland Kitchen albuterol (PROAIR HFA) 108 (90 Base) MCG/ACT inhaler Inhale 1-2 puffs into the lungs every 4 (four)  hours as needed for wheezing or shortness of breath.     Marland Kitchen amLODipine (NORVASC) 5 MG tablet amlodipine 5 mg tablet  TAKE 1 TABLET BY MOUTH ONCE DAILY    . ammonium lactate (LAC-HYDRIN) 12 % lotion ammonium lactate 12 % lotion  APPLY LOTION EXTERNALLY TWICE DAILY AS NEEDED TO DRY ITCHY SKIN    . benzonatate (TESSALON) 100 MG capsule Take 1 capsule (100 mg total) by mouth every 8 (eight) hours. 21 capsule 0  . Butalbital-APAP-Caffeine 50-300-40 MG CAPS butalbital-acetaminophen-caffeine 50 mg-300 mg-40 mg capsule  TAKE 1 TO 2 CAPSULES BY MOUTH EVERY 8 HOURS AS NEEDED FOR BAD HEADACHE. DO NOT TAKE WITH LIDOCAINE.    . cloNIDine (CATAPRES) 0.1 MG tablet Take 0.1 mg by mouth 2 (two) times daily.     . cyclobenzaprine (FLEXERIL) 5 MG tablet Take 5 mg by mouth 4 (four) times daily as needed for muscle spasms.     . diazepam (VALIUM) 5 MG tablet Take 1 tablet (5 mg total) by mouth every 12 (twelve) hours as needed for muscle spasms. 10 tablet 0  . DULoxetine (CYMBALTA) 30 MG capsule Take by mouth.    . Ferrous Sulfate (IRON) 325 (65 Fe) MG TABS Take 325 mg by mouth daily.     . furosemide (LASIX) 20 MG tablet furosemide 20 mg tablet   1 {tbl} by oral route.    . gabapentin (NEURONTIN) 300 MG capsule Take 1 capsule (300 mg total) by mouth 3 (three) times daily. 90 capsule 3  . glimepiride (AMARYL) 4 MG tablet glimepiride 4 mg tablet  TAKE 2 TABLETS BY MOUTH ONCE DAILY    . HUMULIN N 100 UNIT/ML injection Inject 14-27 Units into the skin See admin instructions. Inject 23-27 units in the morning, 14-17 units in the evening start with lower doses first.    . hydrOXYzine (ATARAX/VISTARIL) 25 MG tablet Take 25-50 mg by mouth every 8 (eight) hours as needed.    . insulin lispro (HUMALOG) 100 UNIT/ML injection Inject 20 Units into the skin 2 (two) times daily.     . insulin regular (NOVOLIN R) 100 units/mL injection Novolin R Regular U-100 Insulin 100 unit/mL injection solution   0 {solution}s 3 times a day by  injection route.    . labetalol (NORMODYNE) 300 MG tablet Take 300 mg by mouth 2 (two) times daily.    . nitroGLYCERIN (NITROSTAT) 0.4 MG SL tablet Place 0.4 mg under the tongue every 5 (five) minutes as needed for chest pain.    Marland Kitchen omeprazole (PRILOSEC) 20 MG capsule Take 1 capsule (20 mg total) by mouth 2 (two) times daily before a meal. (Patient taking differently: Take 20 mg by mouth daily. ) 28 capsule 0  . predniSONE (DELTASONE) 20 MG tablet 3  tabs po day one, then 2 po daily x 4 days 11 tablet 0  . sodium bicarbonate 650 MG tablet Take 650 mg by mouth 2 (two) times daily.    . sodium zirconium cyclosilicate (LOKELMA) 10 g PACK packet Lokelma 10 gram oral powder packet  TAKE 1 PACKET BY MOUTH THREE TIMES A WEEK    . Vitamin D, Ergocalciferol, (DRISDOL) 50000 units CAPS capsule Take 50,000 Units by mouth every 7 (seven) days.      Current Facility-Administered Medications  Medication Dose Route Frequency Provider Last Rate Last Admin  . 0.9 %  sodium chloride infusion  500 mL Intravenous Continuous Danis, Kirke Corin, MD        Review of Systems  Constitutional:  Constitutional negative. HENT: HENT negative.  Eyes: Eyes negative.  Respiratory: Respiratory negative.  Cardiovascular: Cardiovascular negative.  GI: Gastrointestinal negative.  Musculoskeletal: Musculoskeletal negative.  Skin: Skin negative.  Neurological: Neurological negative. Hematologic: Hematologic/lymphatic negative.  Psychiatric: Psychiatric negative.        Objective:  Objective  Vitals:   05/01/19 1201  BP: 137/73  Pulse: 69  Resp: 20  Temp: 97.9 F (36.6 C)  SpO2: 99%    Physical Exam HENT:     Head: Normocephalic.     Nose:     Comments: Mask in place Eyes:     Pupils: Pupils are equal, round, and reactive to light.  Cardiovascular:     Pulses:          Radial pulses are 2+ on the right side and 2+ on the left side.     Comments: 2+ bilateral brachial pulses Pulmonary:     Effort:  Pulmonary effort is normal.  Abdominal:     General: Abdomen is flat.     Palpations: Abdomen is soft.  Musculoskeletal:        General: No swelling. Normal range of motion.  Skin:    General: Skin is warm and dry.     Capillary Refill: Capillary refill takes less than 2 seconds.  Neurological:     General: No focal deficit present.     Mental Status: She is alert.  Psychiatric:        Mood and Affect: Mood normal.        Behavior: Behavior normal.        Thought Content: Thought content normal.        Judgment: Judgment normal.     Data: I have independently interpreted her bilateral upper extremity duplex which demonstrates triphasic waveforms bilaterally.  Right brachial artery 0.49 cm left 0.46 cm.  I have independently interpreted her bilateral upper extremity vein mapping.  On the right her cephalic vein is marginal and on the right her basilic vein is measuring 0.45 cm at the antecubitum in the upper arm is not visualized.  On the left side her cephalic vein measures 4.62 cm at the antecubitum up to 0.59 cm in the mid upper arm.  Basilic vein on the left measures 0.26 cm at the antecubital but is 0.42 cm in the distal upper arm possibly suitable for fistula creation.     Assessment/Plan:     59 year old female history of chronic kidney disease presents for dialysis access evaluation.  Does appear to have suitable vein in the upper extremity on the left for either a cephalic or probably more likely above the antecubitum left basilic vein fistula creation.  I discussed the risk benefits alternatives.  We will get her set up in the  near future.  All of her questions were answered.     Waynetta Sandy MD [facility]

## 2019-05-01 NOTE — Progress Notes (Deleted)
Subjective:     HPI:  [name] [name] is a 58 y.o. female with chronic kidney disease.  She has never been on dialysis.  She is right-hand dominant.  She denies any history of port placement or chest breast or upper extremity surgery.  She presents today for evaluation for dialysis access.  She does not take blood thinners.  Past Medical History:  Diagnosis Date  . Anemia   . Anxiety   . Arthritis    "hips" (08/28/2013)  . Chronic lower back pain    10-27-13 not a problem at this time  . Depression   . Esophageal spasm   . Frequent UTI   . GERD (gastroesophageal reflux disease)   . Hepatitis C    genotype 1B  . High cholesterol   . Hypertension   . Migraine    "monthly" (08/28/2013)  . Pneumonia    "several times"  . Type II diabetes mellitus ([place])   . Ulcer    hx gastric ulcers   Family History  Problem Relation Age of Onset  . Hypertension Mother   . Alzheimer's disease Father   . Throat cancer Sister 37       died at age 96  . Colon cancer Neg Hx   . Rectal cancer Neg Hx   . Stomach cancer Neg Hx    Past Surgical History:  Procedure Laterality Date  . ABDOMINAL HYSTERECTOMY    . APPENDECTOMY    . BALLOON DILATION N/A 12/29/2012   Procedure: BALLOON DILATION;  Surgeon: Inda Castle, MD;  Location: [facility];  Service: Endoscopy;  Laterality: N/A;  . BALLOON DILATION N/A 06/25/2013   Procedure: BALLOON DILATION;  Surgeon: Inda Castle, MD;  Location: [facility];  Service: Endoscopy;  Laterality: N/A;  . BOTOX INJECTION  12/29/2012   Procedure: BOTOX INJECTION;  Surgeon: Inda Castle, MD;  Location: [facility];  Service: Endoscopy;;  . BOTOX INJECTION N/A 06/25/2013   Procedure: BOTOX INJECTION;  Surgeon: Inda Castle, MD;  Location: [facility];  Service: Endoscopy;  Laterality: N/A;  . CARDIAC CATHETERIZATION  X 2   /notes 08/28/2013  . CARPAL TUNNEL RELEASE Right   . CHOLECYSTECTOMY    . COLONOSCOPY  04/10/2012   Normal   .  ESOPHAGEAL MANOMETRY N/A 12/26/2015   Procedure: ESOPHAGEAL MANOMETRY (EM);  Surgeon: Doran Stabler, MD;  Location: [facility];  Service: Gastroenterology;  Laterality: N/A;  . ESOPHAGOGASTRODUODENOSCOPY N/A 12/29/2012   Procedure: ESOPHAGOGASTRODUODENOSCOPY (EGD);  Surgeon: Inda Castle, MD;  Location: [facility];  Service: Endoscopy;  Laterality: N/A;  . ESOPHAGOGASTRODUODENOSCOPY N/A 06/25/2013   Procedure: ESOPHAGOGASTRODUODENOSCOPY (EGD);  Surgeon: Inda Castle, MD;  Location: [facility];  Service: Endoscopy;  Laterality: N/A;  . ESOPHAGOGASTRODUODENOSCOPY N/A 11/03/2013   Procedure: ESOPHAGOGASTRODUODENOSCOPY (EGD);  Surgeon: Inda Castle, MD;  Location: [facility];  Service: Endoscopy;  Laterality: N/A;  Botox - ballon dilation  . ESOPHAGOGASTRODUODENOSCOPY N/A 08/14/2016   Procedure: ESOPHAGOGASTRODUODENOSCOPY (EGD);  Surgeon: Milus Banister, MD;  Location: [facility];  Service: Endoscopy;  Laterality: N/A;  . FOOT FRACTURE SURGERY Right   . HEEL SPUR EXCISION Right   . TONSILLECTOMY    . TUBAL LIGATION      Short Social History:  Social History   Tobacco Use  . Smoking status: Never Smoker  . Smokeless tobacco: Never Used  Substance Use Topics  . Alcohol use: No    Allergies  Allergen Reactions  . Hydrochlorothiazide  Other (See Comments)  . Iodinated Diagnostic Agents   . Lisinopril   . Metformin   . Codeine Itching    No hives or rash, takes benadryl  . Dilaudid [Hydromorphone Hcl] Itching    "mild itching hrs after dilaudid given"    Current Outpatient Medications  Medication Sig Dispense Refill  . acetaminophen (TYLENOL) 500 MG tablet Take 1,000 mg by mouth every 4 (four) hours as needed.     Marland Kitchen albuterol (PROAIR HFA) 108 (90 Base) MCG/ACT inhaler Inhale 1-2 puffs into the lungs every 4 (four) hours as needed for wheezing or shortness of breath.     Marland Kitchen amLODipine (NORVASC) 5 MG tablet amlodipine 5 mg tablet  TAKE 1 TABLET BY MOUTH ONCE DAILY     . ammonium lactate (LAC-HYDRIN) 12 % lotion ammonium lactate 12 % lotion  APPLY LOTION EXTERNALLY TWICE DAILY AS NEEDED TO DRY ITCHY SKIN    . benzonatate (TESSALON) 100 MG capsule Take 1 capsule (100 mg total) by mouth every 8 (eight) hours. 21 capsule 0  . Butalbital-APAP-Caffeine 50-300-40 MG CAPS butalbital-acetaminophen-caffeine 50 mg-300 mg-40 mg capsule  TAKE 1 TO 2 CAPSULES BY MOUTH EVERY 8 HOURS AS NEEDED FOR BAD HEADACHE. DO NOT TAKE WITH LIDOCAINE.    . cloNIDine (CATAPRES) 0.1 MG tablet Take 0.1 mg by mouth 2 (two) times daily.     . cyclobenzaprine (FLEXERIL) 5 MG tablet Take 5 mg by mouth 4 (four) times daily as needed for muscle spasms.     . diazepam (VALIUM) 5 MG tablet Take 1 tablet (5 mg total) by mouth every 12 (twelve) hours as needed for muscle spasms. 10 tablet 0  . DULoxetine (CYMBALTA) 30 MG capsule Take by mouth.    . Ferrous Sulfate (IRON) 325 (65 Fe) MG TABS Take 325 mg by mouth daily.     . furosemide (LASIX) 20 MG tablet furosemide 20 mg tablet   1 {tbl} by oral route.    . gabapentin (NEURONTIN) 300 MG capsule Take 1 capsule (300 mg total) by mouth 3 (three) times daily. 90 capsule 3  . glimepiride (AMARYL) 4 MG tablet glimepiride 4 mg tablet  TAKE 2 TABLETS BY MOUTH ONCE DAILY    . HUMULIN N 100 UNIT/ML injection Inject 14-27 Units into the skin See admin instructions. Inject 23-27 units in the morning, 14-17 units in the evening start with lower doses first.    . hydrOXYzine (ATARAX/VISTARIL) 25 MG tablet Take 25-50 mg by mouth every 8 (eight) hours as needed.    . insulin lispro (HUMALOG) 100 UNIT/ML injection Inject 20 Units into the skin 2 (two) times daily.     . insulin regular (NOVOLIN R) 100 units/mL injection Novolin R Regular U-100 Insulin 100 unit/mL injection solution   0 {solution}s 3 times a day by injection route.    . labetalol (NORMODYNE) 300 MG tablet Take 300 mg by mouth 2 (two) times daily.    . nitroGLYCERIN (NITROSTAT) 0.4 MG SL tablet  Place 0.4 mg under the tongue every 5 (five) minutes as needed for chest pain.    Marland Kitchen omeprazole (PRILOSEC) 20 MG capsule Take 1 capsule (20 mg total) by mouth 2 (two) times daily before a meal. (Patient taking differently: Take 20 mg by mouth daily. ) 28 capsule 0  . predniSONE (DELTASONE) 20 MG tablet 3 tabs po day one, then 2 po daily x 4 days 11 tablet 0  . sodium bicarbonate 650 MG tablet Take 650 mg by mouth 2 (two) times  daily.    . sodium zirconium cyclosilicate (LOKELMA) 10 g PACK packet Lokelma 10 gram oral powder packet  TAKE 1 PACKET BY MOUTH THREE TIMES A WEEK    . Vitamin D, Ergocalciferol, (DRISDOL) 50000 units CAPS capsule Take 50,000 Units by mouth every 7 (seven) days.      Current Facility-Administered Medications  Medication Dose Route Frequency Provider Last Rate Last Admin  . 0.9 %  sodium chloride infusion  500 mL Intravenous Continuous Danis, Kirke Corin, MD        Review of Systems  Constitutional:  Constitutional negative. HENT: HENT negative.  Eyes: Eyes negative.  Respiratory: Respiratory negative.  Cardiovascular: Cardiovascular negative.  GI: Gastrointestinal negative.  Musculoskeletal: Musculoskeletal negative.  Skin: Skin negative.  Neurological: Neurological negative. Hematologic: Hematologic/lymphatic negative.  Psychiatric: Psychiatric negative.        Objective:  Objective  Vitals:   05/01/19 1201  BP: 137/73  Pulse: 69  Resp: 20  Temp: 97.9 F (36.6 C)  SpO2: 99%    Physical Exam HENT:     Head: Normocephalic.     Nose:     Comments: Mask in place Eyes:     Pupils: Pupils are equal, round, and reactive to light.  Cardiovascular:     Pulses:          Radial pulses are 2+ on the right side and 2+ on the left side.     Comments: 2+ bilateral brachial pulses Pulmonary:     Effort: Pulmonary effort is normal.  Abdominal:     General: Abdomen is flat.     Palpations: Abdomen is soft.  Musculoskeletal:        General: No swelling.  Normal range of motion.  Skin:    General: Skin is warm and dry.     Capillary Refill: Capillary refill takes less than 2 seconds.  Neurological:     General: No focal deficit present.     Mental Status: She is alert.  Psychiatric:        Mood and Affect: Mood normal.        Behavior: Behavior normal.        Thought Content: Thought content normal.        Judgment: Judgment normal.     Data: I have independently interpreted her bilateral upper extremity duplex which demonstrates triphasic waveforms bilaterally.  Right brachial artery 0.49 cm left 0.46 cm.  I have independently interpreted her bilateral upper extremity vein mapping.  On the right her cephalic vein is marginal and on the right her basilic vein is measuring 0.45 cm at the antecubitum in the upper arm is not visualized.  On the left side her cephalic vein measures 4.00 cm at the antecubitum up to 0.59 cm in the mid upper arm.  Basilic vein on the left measures 0.26 cm at the antecubital but is 0.42 cm in the distal upper arm possibly suitable for fistula creation.     Assessment/Plan:     59 year old female history of chronic kidney disease presents for dialysis access evaluation.  Does appear to have suitable vein in the upper extremity on the left for either a cephalic or probably more likely above the antecubitum left basilic vein fistula creation.  I discussed the risk benefits alternatives.  We will get her set up in the near future.  All of her questions were answered.     Waynetta Sandy MD [facility]

## 2019-05-01 NOTE — Progress Notes (Deleted)
Subjective:     HPI:  [name] [name] is a 59 y.o. female with chronic kidney disease.  She has never been on dialysis.  She is right-hand dominant.  She denies any history of port placement or chest breast or upper extremity surgery.  She presents today for evaluation for dialysis access.  She does not take blood thinners.  Past Medical History:  Diagnosis Date  . Anemia   . Anxiety   . Arthritis    "hips" (08/28/2013)  . Chronic lower back pain    10-27-13 not a problem at this time  . Depression   . Esophageal spasm   . Frequent UTI   . GERD (gastroesophageal reflux disease)   . Hepatitis C    genotype 1B  . High cholesterol   . Hypertension   . Migraine    "monthly" (08/28/2013)  . Pneumonia    "several times"  . Type II diabetes mellitus ([place])   . Ulcer    hx gastric ulcers   Family History  Problem Relation Age of Onset  . Hypertension Mother   . Alzheimer's disease Father   . Throat cancer Sister 51       died at age 70  . Colon cancer Neg Hx   . Rectal cancer Neg Hx   . Stomach cancer Neg Hx    Past Surgical History:  Procedure Laterality Date  . ABDOMINAL HYSTERECTOMY    . APPENDECTOMY    . BALLOON DILATION N/A 12/29/2012   Procedure: BALLOON DILATION;  Surgeon: Inda Castle, MD;  Location: [facility];  Service: Endoscopy;  Laterality: N/A;  . BALLOON DILATION N/A 06/25/2013   Procedure: BALLOON DILATION;  Surgeon: Inda Castle, MD;  Location: [facility];  Service: Endoscopy;  Laterality: N/A;  . BOTOX INJECTION  12/29/2012   Procedure: BOTOX INJECTION;  Surgeon: Inda Castle, MD;  Location: [facility];  Service: Endoscopy;;  . BOTOX INJECTION N/A 06/25/2013   Procedure: BOTOX INJECTION;  Surgeon: Inda Castle, MD;  Location: [facility];  Service: Endoscopy;  Laterality: N/A;  . CARDIAC CATHETERIZATION  X 2   /notes 08/28/2013  . CARPAL TUNNEL RELEASE Right   . CHOLECYSTECTOMY    . COLONOSCOPY  04/10/2012   Normal   . ESOPHAGEAL  MANOMETRY N/A 12/26/2015   Procedure: ESOPHAGEAL MANOMETRY (EM);  Surgeon: Doran Stabler, MD;  [facility]: [facility];  Service: Gastroenterology;  Laterality: N/A;  . ESOPHAGOGASTRODUODENOSCOPY N/A 12/29/2012   Procedure: ESOPHAGOGASTRODUODENOSCOPY (EGD);  Surgeon: Inda Castle, MD;  Location: [facility];  Service: Endoscopy;  Laterality: N/A;  . ESOPHAGOGASTRODUODENOSCOPY N/A 06/25/2013   Procedure: ESOPHAGOGASTRODUODENOSCOPY (EGD);  Surgeon: Inda Castle, MD;  Location: [facility];  Service: Endoscopy;  Laterality: N/A;  . ESOPHAGOGASTRODUODENOSCOPY N/A 11/03/2013   Procedure: ESOPHAGOGASTRODUODENOSCOPY (EGD);  Surgeon: Inda Castle, MD;  Location: [facility];  Service: Endoscopy;  Laterality: N/A;  Botox - ballon dilation  . ESOPHAGOGASTRODUODENOSCOPY N/A 08/14/2016   Procedure: ESOPHAGOGASTRODUODENOSCOPY (EGD);  Surgeon: Milus Banister, MD;  Location[facility];  Service: Endoscopy;  Laterality: N/A;  . FOOT FRACTURE SURGERY Right   . HEEL SPUR EXCISION Right   . TONSILLECTOMY    . TUBAL LIGATION      Short Social History:  Social History   Tobacco Use  . Smoking status: Never Smoker  . Smokeless tobacco: Never Used  Substance Use Topics  . Alcohol use: No    Allergies  Allergen Reactions  . Hydrochlorothiazide Other (  See Comments)  . Iodinated Diagnostic Agents   . Lisinopril   . Metformin   . Codeine Itching    No hives or rash, takes benadryl  . Dilaudid [Hydromorphone Hcl] Itching    "mild itching hrs after dilaudid given"    Current Outpatient Medications  Medication Sig Dispense Refill  . acetaminophen (TYLENOL) 500 MG tablet Take 1,000 mg by mouth every 4 (four) hours as needed.     Marland Kitchen albuterol (PROAIR HFA) 108 (90 Base) MCG/ACT inhaler Inhale 1-2 puffs into the lungs every 4 (four) hours as needed for wheezing or shortness of breath.     Marland Kitchen amLODipine (NORVASC) 5 MG tablet amlodipine 5 mg tablet  TAKE 1 TABLET BY MOUTH ONCE DAILY    .  ammonium lactate (LAC-HYDRIN) 12 % lotion ammonium lactate 12 % lotion  APPLY LOTION EXTERNALLY TWICE DAILY AS NEEDED TO DRY ITCHY SKIN    . benzonatate (TESSALON) 100 MG capsule Take 1 capsule (100 mg total) by mouth every 8 (eight) hours. 21 capsule 0  . Butalbital-APAP-Caffeine 50-300-40 MG CAPS butalbital-acetaminophen-caffeine 50 mg-300 mg-40 mg capsule  TAKE 1 TO 2 CAPSULES BY MOUTH EVERY 8 HOURS AS NEEDED FOR BAD HEADACHE. DO NOT TAKE WITH LIDOCAINE.    . cloNIDine (CATAPRES) 0.1 MG tablet Take 0.1 mg by mouth 2 (two) times daily.     . cyclobenzaprine (FLEXERIL) 5 MG tablet Take 5 mg by mouth 4 (four) times daily as needed for muscle spasms.     . diazepam (VALIUM) 5 MG tablet Take 1 tablet (5 mg total) by mouth every 12 (twelve) hours as needed for muscle spasms. 10 tablet 0  . DULoxetine (CYMBALTA) 30 MG capsule Take by mouth.    . Ferrous Sulfate (IRON) 325 (65 Fe) MG TABS Take 325 mg by mouth daily.     . furosemide (LASIX) 20 MG tablet furosemide 20 mg tablet   1 {tbl} by oral route.    . gabapentin (NEURONTIN) 300 MG capsule Take 1 capsule (300 mg total) by mouth 3 (three) times daily. 90 capsule 3  . glimepiride (AMARYL) 4 MG tablet glimepiride 4 mg tablet  TAKE 2 TABLETS BY MOUTH ONCE DAILY    . HUMULIN N 100 UNIT/ML injection Inject 14-27 Units into the skin See admin instructions. Inject 23-27 units in the morning, 14-17 units in the evening start with lower doses first.    . hydrOXYzine (ATARAX/VISTARIL) 25 MG tablet Take 25-50 mg by mouth every 8 (eight) hours as needed.    . insulin lispro (HUMALOG) 100 UNIT/ML injection Inject 20 Units into the skin 2 (two) times daily.     . insulin regular (NOVOLIN R) 100 units/mL injection Novolin R Regular U-100 Insulin 100 unit/mL injection solution   0 {solution}s 3 times a day by injection route.    . labetalol (NORMODYNE) 300 MG tablet Take 300 mg by mouth 2 (two) times daily.    . nitroGLYCERIN (NITROSTAT) 0.4 MG SL tablet Place  0.4 mg under the tongue every 5 (five) minutes as needed for chest pain.    Marland Kitchen omeprazole (PRILOSEC) 20 MG capsule Take 1 capsule (20 mg total) by mouth 2 (two) times daily before a meal. (Patient taking differently: Take 20 mg by mouth daily. ) 28 capsule 0  . predniSONE (DELTASONE) 20 MG tablet 3 tabs po day one, then 2 po daily x 4 days 11 tablet 0  . sodium bicarbonate 650 MG tablet Take 650 mg by mouth 2 (two) times daily.    Marland Kitchen  sodium zirconium cyclosilicate (LOKELMA) 10 g PACK packet Lokelma 10 gram oral powder packet  TAKE 1 PACKET BY MOUTH THREE TIMES A WEEK    . Vitamin D, Ergocalciferol, (DRISDOL) 50000 units CAPS capsule Take 50,000 Units by mouth every 7 (seven) days.      Current Facility-Administered Medications  Medication Dose Route Frequency Provider Last Rate Last Admin  . 0.9 %  sodium chloride infusion  500 mL Intravenous Continuous Danis, Kirke Corin, MD        Review of Systems  Constitutional:  Constitutional negative. HENT: HENT negative.  Eyes: Eyes negative.  Respiratory: Respiratory negative.  Cardiovascular: Cardiovascular negative.  GI: Gastrointestinal negative.  Musculoskeletal: Musculoskeletal negative.  Skin: Skin negative.  Neurological: Neurological negative. Hematologic: Hematologic/lymphatic negative.  Psychiatric: Psychiatric negative.        Objective:  Objective  Vitals:   05/01/19 1201  BP: 137/73  Pulse: 69  Resp: 20  Temp: 97.9 F (36.6 C)  SpO2: 99%    Physical Exam HENT:     Head: Normocephalic.     Nose:     Comments: Mask in place Eyes:     Pupils: Pupils are equal, round, and reactive to light.  Cardiovascular:     Pulses:          Radial pulses are 2+ on the right side and 2+ on the left side.     Comments: 2+ bilateral brachial pulses Pulmonary:     Effort: Pulmonary effort is normal.  Abdominal:     General: Abdomen is flat.     Palpations: Abdomen is soft.  Musculoskeletal:        General: No swelling. Normal  range of motion.  Skin:    General: Skin is warm and dry.     Capillary Refill: Capillary refill takes less than 2 seconds.  Neurological:     General: No focal deficit present.     Mental Status: She is alert.  Psychiatric:        Mood and Affect: Mood normal.        Behavior: Behavior normal.        Thought Content: Thought content normal.        Judgment: Judgment normal.     Data: I have independently interpreted her bilateral upper extremity duplex which demonstrates triphasic waveforms bilaterally.  Right brachial artery 0.49 cm left 0.46 cm.  I have independently interpreted her bilateral upper extremity vein mapping.  On the right her cephalic vein is marginal and on the right her basilic vein is measuring 0.45 cm at the antecubitum in the upper arm is not visualized.  On the left side her cephalic vein measures 3.55 cm at the antecubitum up to 0.59 cm in the mid upper arm.  Basilic vein on the left measures 0.26 cm at the antecubital but is 0.42 cm in the distal upper arm possibly suitable for fistula creation.     Assessment/Plan:     59 year old female history of chronic kidney disease presents for dialysis access evaluation.  Does appear to have suitable vein in the upper extremity on the left for either a cephalic or probably more likely above the antecubitum left basilic vein fistula creation.  I discussed the risk benefits alternatives.  We will get her set up in the near future.  All of her questions were answered.     Waynetta Sandy MD [facility]

## 2019-05-01 NOTE — Progress Notes (Signed)
    Patient ID: [name] [name], female   DOB: October 04, 1960, 59 y.o.   MRN: [id number]  Reason for Consult: New Patient (Initial Visit)   Referred by [name], MD  Subjective:     HPI:  [name] [name] is a 59 y.o. female with chronic kidney disease.  She has never been on dialysis.  She is right-hand dominant.  She denies any history of port placement or chest breast or upper extremity surgery.  She presents today for evaluation for dialysis access.  She does not take blood thinners.   Review of Systems  Constitutional:  Constitutional negative. HENT: HENT negative.  Eyes: Eyes negative.  Respiratory: Respiratory negative.  Cardiovascular: Cardiovascular negative.  GI: Gastrointestinal negative.  Musculoskeletal: Musculoskeletal negative.  Skin: Skin negative.  Neurological: Neurological negative. Hematologic: Hematologic/lymphatic negative.  Psychiatric: Psychiatric negative.        Objective:  Objective   Vitals:   05/01/19 1201  BP: 137/73  Pulse: 69  Resp: 20  Temp: 97.9 F (36.6 C)  SpO2: 99%  Weight: 270 lb (122.5 kg)  Height: 5\' 5"  (1.651 m)   Body mass index is 44.93 kg/m.  Physical Exam Eyes:     Pupils: Pupils are equal, round, and reactive to light.  Cardiovascular:     Rate and Rhythm: Normal rate.     Pulses: Normal pulses.  Pulmonary:     Effort: Pulmonary effort is normal.  Abdominal:     General: Abdomen is flat.     Palpations: Abdomen is soft.  Musculoskeletal:        General: No swelling. Normal range of motion.     Cervical back: Normal range of motion and neck supple.  Skin:    General: Skin is dry.     Capillary Refill: Capillary refill takes less than 2 seconds.  Neurological:     General: No focal deficit present.     Mental Status: She is alert.  Psychiatric:        Mood and Affect: Mood normal.        Behavior: Behavior normal.        Thought Content: Thought content normal.        Judgment: Judgment normal.      Data: I have independently interpreted her bilateral upper extremity duplex which demonstrates triphasic waveforms bilaterally.  Right brachial artery 0.49 cm left 0.46 cm.  I have independently interpreted her bilateral upper extremity vein mapping.  On the right her cephalic vein is marginal and on the right her basilic vein is measuring 0.45 cm at the antecubitum in the upper arm is not visualized.  On the left side her cephalic vein measures 1.66 cm at the antecubitum up to 0.59 cm in the mid upper arm.  Basilic vein on the left measures 0.26 cm at the antecubital but is 0.42 cm in the distal upper arm possibly suitable for fistula creation.     Assessment/Plan:     59 year old female history of chronic kidney disease presents for dialysis access evaluation.  Does appear to have suitable vein in the upper extremity on the left for either a cephalic or probably more likely above the antecubitum left basilic vein fistula creation.  I discussed the risk benefits alternatives.  We will get her set up in the near future.  All of her questions were answered.     Waynetta Sandy MD [facility]

## 2019-05-18 ENCOUNTER — Encounter (HOSPITAL_COMMUNITY): Payer: Self-pay | Admitting: Vascular Surgery

## 2019-05-18 ENCOUNTER — Other Ambulatory Visit (HOSPITAL_COMMUNITY)
Admission: RE | Admit: 2019-05-18 | Discharge: 2019-05-18 | Disposition: A | Discharge status: Home / Self Care | Payer: Medicaid Other | Source: Ambulatory Visit | Attending: Vascular Surgery | Admitting: Vascular Surgery

## 2019-05-18 DIAGNOSIS — Z01812 Encounter for preprocedural laboratory examination: Secondary | ICD-10-CM | POA: Insufficient documentation

## 2019-05-18 DIAGNOSIS — Z20822 Contact with and (suspected) exposure to covid-19: Secondary | ICD-10-CM | POA: Insufficient documentation

## 2019-05-18 LAB — SARS CORONAVIRUS 2 (TAT 6-24 HRS): SARS Coronavirus 2: NEGATIVE

## 2019-05-18 MED ORDER — DEXTROSE 5 % IV SOLN
3.0000 g | INTRAVENOUS | Status: AC
  Administered 2019-05-19: 3 g via INTRAVENOUS
  Filled 2019-05-18: qty 3

## 2019-05-18 NOTE — Progress Notes (Signed)
Patient denies chest pain or shob. 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 Educated on the visitation policy. Below instructions provided regarding DM management.  How do I manage my blood sugar before surgery? . Check your blood sugar at least 4 times a day, starting 2 days before surgery, to make sure that the level is not too high or low. o Check your blood sugar the morning of your surgery when you wake up and every 2 hours until you get to the Short Stay unit. . If your blood sugar is less than 70 mg/dL, you will need to treat for low blood sugar: o Do not take insulin. o Treat a low blood sugar (less than 70 mg/dL) with  cup of clear juice (cranberry or apple), 4 glucose tablets, OR glucose gel. Recheck blood sugar in 15 minutes after treatment (to make sure it is greater than 70 mg/dL). If your blood sugar is not greater than 70 mg/dL on recheck, call [phone] o  for further instructions. . Report your blood sugar to the short stay nurse when you get to Short Stay.  . If you are admitted to the hospital after surgery: o Your blood sugar will be checked by the staff and you will probably be given insulin after surgery (instead of oral diabetes medicines) to make sure you have good blood sugar levels. o The goal for blood sugar control after surgery is 80-180 mg/dL.  WHAT DO I DO ABOUT MY DIABETES MEDICATION?   Marland Kitchen Do not take oral diabetes medicines (pills) the morning of surgery.  . THE NIGHT BEFORE SURGERY, take ____8_______ units of _Humulin N__________insulin.       . THE MORNING OF SURGERY, take ___11__________ units of _Humulin N_________insulin.

## 2019-05-18 NOTE — Anesthesia Preprocedure Evaluation (Addendum)
Anesthesia Evaluation  Patient identified by MRN, date of birth, ID band Patient awake    Reviewed: Allergy & Precautions, NPO status , Patient's Chart, lab work & pertinent test results  Airway Mallampati: III  TM Distance: >3 FB Neck ROM: Full    Dental  (+) Teeth Intact, Dental Advisory Given   Pulmonary asthma ,    breath sounds clear to auscultation       Cardiovascular hypertension, Pt. on home beta blockers  Rhythm:Regular Rate:Normal     Neuro/Psych  Headaches, PSYCHIATRIC DISORDERS Anxiety Depression    GI/Hepatic PUD, GERD  Medicated,(+) Hepatitis -  Endo/Other  diabetes, Type 2, Oral Hypoglycemic Agents  Renal/GU      Musculoskeletal  (+) Arthritis ,   Abdominal (+) + obese,   Peds  Hematology   Anesthesia Other Findings   Reproductive/Obstetrics                            Echo:  1. The left ventricle has normal systolic function with an ejection  fraction of 60-65%. The cavity size was normal. There is mildly increased  left ventricular wall thickness. Left ventricular diastolic Doppler  parameters are consistent with impaired  relaxation.  2. The right ventricle has normal systolic function. The cavity was  normal. There is no increase in right ventricular wall thickness.   Anesthesia Physical Anesthesia Plan  ASA: III  Anesthesia Plan: MAC   Post-op Pain Management:    Induction: Intravenous  PONV Risk Score and Plan: 3 and Ondansetron, Propofol infusion, Treatment may vary due to age or medical condition and Midazolam  Airway Management Planned: Natural Airway and Simple Face Mask  Additional Equipment: None  Intra-op Plan:   Post-operative Plan:   Informed Consent: I have reviewed the patients History and Physical, chart, labs and discussed the procedure including the risks, benefits and alternatives for the proposed anesthesia with the patient or  authorized representative who has indicated his/her understanding and acceptance.       Plan Discussed with: CRNA  Anesthesia Plan Comments: (PAT note written 05/18/2019 by Myra Gianotti, PA-C. )      Anesthesia Quick Evaluation

## 2019-05-18 NOTE — Progress Notes (Signed)
Anesthesia Chart Review: [name] [name]   Case: [id number] Date/Time: 05/19/19 0715   Procedure: LEFT ARM ARTERIOVENOUS (AV) FISTULA CREATION (Left )   Anesthesia type: Monitor Anesthesia Care   Pre-op diagnosis: CHRONIC KIDNEY DISEASE STAGE IV   Location: [place] OR ROOM 10 / [facility]   Surgeons: Waynetta Sandy, MD      DISCUSSION: Patient is a 59 year old female scheduled for the above procedure. She is not yet on hemodialysis.  History includes never smoker, GERD, HTN, Hepatitis C, hypercholesterolemia, migraines, anemia, PUD, esophageal spasms, DM2, CKD, hysterectomy. Unremarkable cardiac testing for syncope last year.   05/18/19 presurgical COVID-19 test is in process. Known DM with elevated glucose in the past--no recent A1c found. She is a same day work-up, so she will get labs and anesthesia evaluation on the day of surgery.    VS:  BP Readings from Last 3 Encounters:  05/01/19 137/73  10/31/18 (!) 205/115  06/09/18 (!) 153/79   Pulse Readings from Last 3 Encounters:  05/01/19 69  10/31/18 84  06/09/18 68    PROVIDERS: [name], MD is PCP  Maude Leriche, MD is cardiologist. Last evaluation 07/10/18 for HTN and syncope. She had a non-ischemic stress test, uneventful cardiac monitor, and normal LVEF without significant valvular abnormality noted by echo.    LABS: She is for ISTAT on arrival. Last labs seen from 10/31/18 show a glucose 386, Cr 2.81, H/H 10.9/31.5, PLT 292.    IMAGES: PCXR 10/31/18: FINDINGS: Study limited by portable technique and patient body habitus. Lung volumes are low with accentuation of cardiac silhouette. No signs of dense consolidation. Question interstitial prominence. Or clear evidence of effusion accounting for technical factors. No signs of acute bone finding. IMPRESSION: Low volume chest with question of interstitial prominence.   EKG: 10/31/18: Sinus rhythm Abnormal T, consider ischemia, lateral leads Minimal ST elevation,  anterior leads Confirmed by Julianne Rice [phone]) on 10/31/2018 3:14:11 PM (Known history of inferolateral T wave abnormality, negative T waves in V3-6, I, aVL, II, avF on 08/13/16 tracing)   CV: Carotid US 09/15/18 ([place]; report found under [facility]/PACS) IMPRESSION: Color duplex indicates minimal heterogeneous and calcified plaque, with no hemodynamically significant stenosis by duplex criteria in the extracranial cerebrovascular circulation   Cardiac event monitor 07/18/2018 to 08/16/2018: Indication:                    Syncope and collapse Ordering physician:  Jenean Lindau, MD  Baseline rhythm: Sinus Atrial arrhythmia: None significant Ventricular arrhythmia: None significant Conduction abnormality: None significant Symptoms: Multiple symptoms of chest pain and shortness of breath amongst others Conclusion:  Monitoring was within normal limits.  Symptoms did not correlate with any significant findings on the monitor. Interpreting  cardiologist: Jenean Lindau, MD    Echo 08/07/18: IMPRESSIONS   1. The left ventricle has normal systolic function with an ejection  fraction of 60-65%. The cavity size was normal. There is mildly increased  left ventricular wall thickness. Left ventricular diastolic Doppler  parameters are consistent with impaired  relaxation.  2. The right ventricle has normal systolic function. The cavity was  normal. There is no increase in right ventricular wall thickness.    Nuclear stress test 07/15/18:  Nuclear stress EF: 59%.  The left ventricular ejection fraction is normal (55-65%).  Blood pressure demonstrated a normal response to exercise.  There was no ST segment deviation noted during stress.  This is a low risk study.  No  ischemia, no MI, normal EF.   Normal coronaries by 07/27/99 cardiac [facility] at [facility]. According to 08/28/13 note by Martinique, Peter, MD, "Records received from [facility]. Patient had cardiac caths there in  May 2009,  Jan 2011 and February 2012. These showed tapering of the distal LAD but no significant CAD. EF normal. "    Past Medical History:  Diagnosis Date  . Anemia   . Anxiety   . Arthritis    "hips" (08/28/2013)  . Chronic lower back pain    10-27-13 not a problem at this time  . Depression   . Esophageal spasm   . Frequent UTI   . GERD (gastroesophageal reflux disease)   . Hepatitis C    genotype 1B  . High cholesterol   . Hypertension   . Migraine    "monthly" (08/28/2013)  . Pneumonia    "several times"  . Type II diabetes mellitus ([place])   . Ulcer    hx gastric ulcers    Past Surgical History:  Procedure Laterality Date  . ABDOMINAL HYSTERECTOMY    . APPENDECTOMY    . BALLOON DILATION N/A 12/29/2012   Procedure: BALLOON DILATION;  Surgeon: Inda Castle, MD;  Location: [facility];  Service: Endoscopy;  Laterality: N/A;  . BALLOON DILATION N/A 06/25/2013   Procedure: BALLOON DILATION;  Surgeon: Inda Castle, MD;  Location: [facility];  Service: Endoscopy;  Laterality: N/A;  . BOTOX INJECTION  12/29/2012   Procedure: BOTOX INJECTION;  Surgeon: Inda Castle, MD;  Location: [facility];  Service: Endoscopy;;  . BOTOX INJECTION N/A 06/25/2013   Procedure: BOTOX INJECTION;  Surgeon: Inda Castle, MD;  Location: [facility];  Service: Endoscopy;  Laterality: N/A;  . CARDIAC CATHETERIZATION  X 2   /notes 08/28/2013  . CARPAL TUNNEL RELEASE Right   . CHOLECYSTECTOMY    . COLONOSCOPY  04/10/2012   Normal   . ESOPHAGEAL MANOMETRY N/A 12/26/2015   Procedure: ESOPHAGEAL MANOMETRY (EM);  Surgeon: Doran Stabler, MD;  Location: [facility];  Service: Gastroenterology;  Laterality: N/A;  . ESOPHAGOGASTRODUODENOSCOPY N/A 12/29/2012   Procedure: ESOPHAGOGASTRODUODENOSCOPY (EGD);  Surgeon: Inda Castle, MD;  Location: [facility];  Service: Endoscopy;  Laterality: N/A;  . ESOPHAGOGASTRODUODENOSCOPY N/A 06/25/2013   Procedure: ESOPHAGOGASTRODUODENOSCOPY (EGD);   Surgeon: Inda Castle, MD;  Location: [facility];  Service: Endoscopy;  Laterality: N/A;  . ESOPHAGOGASTRODUODENOSCOPY N/A 11/03/2013   Procedure: ESOPHAGOGASTRODUODENOSCOPY (EGD);  Surgeon: Inda Castle, MD;  Location: [facility];  Service: Endoscopy;  Laterality: N/A;  Botox - ballon dilation  . ESOPHAGOGASTRODUODENOSCOPY N/A 08/14/2016   Procedure: ESOPHAGOGASTRODUODENOSCOPY (EGD);  Surgeon: Milus Banister, MD;  Location: [facility] ENDOSCOPY;  Service: Endoscopy;  Laterality: N/A;  . FOOT FRACTURE SURGERY Right   . HEEL SPUR EXCISION Right   . TONSILLECTOMY    . TUBAL LIGATION      MEDICATIONS: . 0.9 %  sodium chloride infusion  . [START ON 05/19/2019] ceFAZolin (ANCEF) 3 g in dextrose 5 % 50 mL IVPB   . acetaminophen (TYLENOL) 500 MG tablet  . albuterol (PROAIR HFA) 108 (90 Base) MCG/ACT inhaler  . ammonium lactate (LAC-HYDRIN) 12 % lotion  . calcitRIOL (ROCALTROL) 0.25 MCG capsule  . Carboxymethylcellul-Glycerin (LUBRICATING EYE DROPS OP)  . Cholecalciferol (VITAMIN D) 50 MCG (2000 UT) tablet  . cloNIDine (CATAPRES) 0.1 MG tablet  . cyclobenzaprine (FLEXERIL) 10 MG tablet  . gabapentin (NEURONTIN) 300 MG capsule  . glimepiride (AMARYL) 4 MG tablet  .  HUMULIN N 100 UNIT/ML injection  . labetalol (NORMODYNE) 200 MG tablet  . labetalol (NORMODYNE) 300 MG tablet  . lidocaine (LIDODERM) 5 %  . Magnesium 400 MG TABS  . nitroGLYCERIN (NITROSTAT) 0.4 MG SL tablet  . omeprazole (PRILOSEC) 20 MG capsule  . oxymetazoline (AFRIN) 0.05 % nasal spray  . sodium bicarbonate 650 MG tablet  . sodium zirconium cyclosilicate (LOKELMA) 10 g PACK packet  . traMADol (ULTRAM) 50 MG tablet    Myra Gianotti, PA-C Surgical Short Stay/Anesthesiology [facility] Phone [phone] [facility] Phone [phone] 05/18/2019 3:33 PM

## 2019-05-19 ENCOUNTER — Ambulatory Visit (HOSPITAL_COMMUNITY): Payer: Medicaid Other | Admitting: Certified Registered Nurse Anesthetist

## 2019-05-19 ENCOUNTER — Encounter (HOSPITAL_COMMUNITY): Payer: Self-pay | Admitting: Vascular Surgery

## 2019-05-19 ENCOUNTER — Other Ambulatory Visit: Payer: Self-pay

## 2019-05-19 ENCOUNTER — Encounter (HOSPITAL_COMMUNITY)
Admission: RE | Disposition: A | Discharge status: Home / Self Care | Payer: Self-pay | Source: Home / Self Care | Attending: Vascular Surgery

## 2019-05-19 ENCOUNTER — Ambulatory Visit (HOSPITAL_COMMUNITY)
Admission: RE | Admit: 2019-05-19 | Discharge: 2019-05-19 | Disposition: A | Discharge status: Home / Self Care | Payer: Medicaid Other | Attending: Vascular Surgery | Admitting: Vascular Surgery

## 2019-05-19 DIAGNOSIS — Z794 Long term (current) use of insulin: Secondary | ICD-10-CM | POA: Insufficient documentation

## 2019-05-19 DIAGNOSIS — J45909 Unspecified asthma, uncomplicated: Secondary | ICD-10-CM | POA: Insufficient documentation

## 2019-05-19 DIAGNOSIS — M160 Bilateral primary osteoarthritis of hip: Secondary | ICD-10-CM | POA: Insufficient documentation

## 2019-05-19 DIAGNOSIS — N185 Chronic kidney disease, stage 5: Secondary | ICD-10-CM

## 2019-05-19 DIAGNOSIS — I129 Hypertensive chronic kidney disease with stage 1 through stage 4 chronic kidney disease, or unspecified chronic kidney disease: Secondary | ICD-10-CM | POA: Diagnosis not present

## 2019-05-19 DIAGNOSIS — N189 Chronic kidney disease, unspecified: Secondary | ICD-10-CM | POA: Insufficient documentation

## 2019-05-19 DIAGNOSIS — Z79899 Other long term (current) drug therapy: Secondary | ICD-10-CM | POA: Insufficient documentation

## 2019-05-19 DIAGNOSIS — E1122 Type 2 diabetes mellitus with diabetic chronic kidney disease: Secondary | ICD-10-CM | POA: Diagnosis not present

## 2019-05-19 DIAGNOSIS — Z79891 Long term (current) use of opiate analgesic: Secondary | ICD-10-CM | POA: Insufficient documentation

## 2019-05-19 DIAGNOSIS — K219 Gastro-esophageal reflux disease without esophagitis: Secondary | ICD-10-CM | POA: Insufficient documentation

## 2019-05-19 HISTORY — PX: AV FISTULA PLACEMENT: SHX1204

## 2019-05-19 HISTORY — DX: Chronic kidney disease, unspecified: N18.9

## 2019-05-19 HISTORY — DX: Unspecified asthma, uncomplicated: J45.909

## 2019-05-19 LAB — GLUCOSE, CAPILLARY
Glucose-Capillary: 100 mg/dL — ABNORMAL HIGH (ref 70–99)
Glucose-Capillary: 151 mg/dL — ABNORMAL HIGH (ref 70–99)

## 2019-05-19 LAB — POCT I-STAT, CHEM 8
BUN: 49 mg/dL — ABNORMAL HIGH (ref 6–20)
Calcium, Ion: 1.02 mmol/L — ABNORMAL LOW (ref 1.15–1.40)
Chloride: 110 mmol/L (ref 98–111)
Creatinine, Ser: 5.1 mg/dL — ABNORMAL HIGH (ref 0.44–1.00)
Glucose, Bld: 116 mg/dL — ABNORMAL HIGH (ref 70–99)
HCT: 29 % — ABNORMAL LOW (ref 36.0–46.0)
Hemoglobin: 9.9 g/dL — ABNORMAL LOW (ref 12.0–15.0)
Potassium: 5.5 mmol/L — ABNORMAL HIGH (ref 3.5–5.1)
Sodium: 134 mmol/L — ABNORMAL LOW (ref 135–145)
TCO2: 18 mmol/L — ABNORMAL LOW (ref 22–32)

## 2019-05-19 SURGERY — ARTERIOVENOUS (AV) FISTULA CREATION
Anesthesia: Monitor Anesthesia Care | Site: Arm Upper | Laterality: Left

## 2019-05-19 MED ORDER — PHENYLEPHRINE 40 MCG/ML (10ML) SYRINGE FOR IV PUSH (FOR BLOOD PRESSURE SUPPORT)
PREFILLED_SYRINGE | INTRAVENOUS | Status: DC | PRN
  Administered 2019-05-19 (×2): 80 ug via INTRAVENOUS

## 2019-05-19 MED ORDER — FENTANYL CITRATE (PF) 250 MCG/5ML IJ SOLN
INTRAMUSCULAR | Status: AC
  Filled 2019-05-19: qty 5

## 2019-05-19 MED ORDER — ONDANSETRON HCL 4 MG/2ML IJ SOLN
INTRAMUSCULAR | Status: AC
  Filled 2019-05-19: qty 2

## 2019-05-19 MED ORDER — ACETAMINOPHEN 325 MG PO TABS: 325.0000 mg | ORAL_TABLET | Freq: Once | ORAL | Status: DC | PRN

## 2019-05-19 MED ORDER — FENTANYL CITRATE (PF) 100 MCG/2ML IJ SOLN: 25.0000 ug | INTRAMUSCULAR | Status: DC | PRN

## 2019-05-19 MED ORDER — LIDOCAINE 2% (20 MG/ML) 5 ML SYRINGE
INTRAMUSCULAR | Status: DC | PRN
  Administered 2019-05-19: 40 mg via INTRAVENOUS

## 2019-05-19 MED ORDER — MEPERIDINE HCL 25 MG/ML IJ SOLN: 6.2500 mg | INTRAMUSCULAR | Status: DC | PRN

## 2019-05-19 MED ORDER — CHLORHEXIDINE GLUCONATE 4 % EX LIQD: 60.0000 mL | Freq: Once | CUTANEOUS | Status: DC

## 2019-05-19 MED ORDER — LIDOCAINE-EPINEPHRINE 1 %-1:100000 IJ SOLN
INTRAMUSCULAR | Status: AC
  Filled 2019-05-19: qty 1

## 2019-05-19 MED ORDER — PROPOFOL 1000 MG/100ML IV EMUL
INTRAVENOUS | Status: AC
  Filled 2019-05-19: qty 100

## 2019-05-19 MED ORDER — MIDAZOLAM HCL 2 MG/2ML IJ SOLN
INTRAMUSCULAR | Status: AC
  Filled 2019-05-19: qty 2

## 2019-05-19 MED ORDER — SODIUM CHLORIDE 0.9 % IV SOLN: INTRAVENOUS | Status: DC | PRN

## 2019-05-19 MED ORDER — LIDOCAINE HCL 1 % IJ SOLN
INTRAMUSCULAR | Status: DC | PRN
  Administered 2019-05-19: 1 mL

## 2019-05-19 MED ORDER — SODIUM CHLORIDE 0.9 % IV SOLN: INTRAVENOUS | Status: DC

## 2019-05-19 MED ORDER — PROPOFOL 500 MG/50ML IV EMUL
INTRAVENOUS | Status: DC | PRN
  Administered 2019-05-19: 100 ug/kg/min via INTRAVENOUS

## 2019-05-19 MED ORDER — LIDOCAINE 2% (20 MG/ML) 5 ML SYRINGE
INTRAMUSCULAR | Status: AC
  Filled 2019-05-19: qty 5

## 2019-05-19 MED ORDER — ONDANSETRON HCL 4 MG/2ML IJ SOLN
INTRAMUSCULAR | Status: DC | PRN
  Administered 2019-05-19: 4 mg via INTRAVENOUS

## 2019-05-19 MED ORDER — TRAMADOL HCL 50 MG PO TABS: 50.0000 mg | ORAL_TABLET | Freq: Four times a day (QID) | ORAL | 0 refills | Status: DC | PRN

## 2019-05-19 MED ORDER — SODIUM CHLORIDE 0.9 % IV SOLN
INTRAVENOUS | Status: DC | PRN
  Administered 2019-05-19: 500 mL

## 2019-05-19 MED ORDER — LACTATED RINGERS IV SOLN: INTRAVENOUS | Status: DC

## 2019-05-19 MED ORDER — LIDOCAINE-EPINEPHRINE (PF) 1 %-1:200000 IJ SOLN
INTRAMUSCULAR | Status: DC | PRN
  Administered 2019-05-19: 20 mL

## 2019-05-19 MED ORDER — PROMETHAZINE HCL 25 MG/ML IJ SOLN: 6.2500 mg | INTRAMUSCULAR | Status: DC | PRN

## 2019-05-19 MED ORDER — 0.9 % SODIUM CHLORIDE (POUR BTL) OPTIME
TOPICAL | Status: DC | PRN
  Administered 2019-05-19: 1000 mL

## 2019-05-19 MED ORDER — SODIUM CHLORIDE 0.9 % IV SOLN
INTRAVENOUS | Status: AC
  Filled 2019-05-19: qty 1.2

## 2019-05-19 MED ORDER — PAPAVERINE HCL 30 MG/ML IJ SOLN
INTRAMUSCULAR | Status: AC
  Filled 2019-05-19: qty 2

## 2019-05-19 MED ORDER — LIDOCAINE HCL (PF) 1 % IJ SOLN
INTRAMUSCULAR | Status: AC
  Filled 2019-05-19: qty 30

## 2019-05-19 MED ORDER — ACETAMINOPHEN 10 MG/ML IV SOLN: 1000.0000 mg | Freq: Once | INTRAVENOUS | Status: DC | PRN

## 2019-05-19 MED ORDER — FENTANYL CITRATE (PF) 100 MCG/2ML IJ SOLN
INTRAMUSCULAR | Status: DC | PRN
  Administered 2019-05-19: 50 ug via INTRAVENOUS
  Administered 2019-05-19: 25 ug via INTRAVENOUS

## 2019-05-19 MED ORDER — PROPOFOL 10 MG/ML IV BOLUS
INTRAVENOUS | Status: DC | PRN
  Administered 2019-05-19: 20 mg via INTRAVENOUS
  Administered 2019-05-19: 10 mg via INTRAVENOUS

## 2019-05-19 MED ORDER — ACETAMINOPHEN 160 MG/5ML PO SOLN: 325.0000 mg | Freq: Once | ORAL | Status: DC | PRN

## 2019-05-19 SURGICAL SUPPLY — 35 items
ADH SKN CLS APL DERMABOND .7 (GAUZE/BANDAGES/DRESSINGS) ×1
ARMBAND PINK RESTRICT EXTREMIT (MISCELLANEOUS) ×2 IMPLANT
CANISTER SUCT 3000ML PPV (MISCELLANEOUS) ×2 IMPLANT
CLIP VESOCCLUDE MED 6/CT (CLIP) ×2 IMPLANT
CLIP VESOCCLUDE SM WIDE 6/CT (CLIP) ×3 IMPLANT
COVER PROBE W GEL 5X96 (DRAPES) IMPLANT
COVER WAND RF STERILE (DRAPES) ×1 IMPLANT
DERMABOND ADVANCED (GAUZE/BANDAGES/DRESSINGS) ×1
DERMABOND ADVANCED .7 DNX12 (GAUZE/BANDAGES/DRESSINGS) ×1 IMPLANT
ELECT REM PT RETURN 9FT ADLT (ELECTROSURGICAL) ×2
ELECTRODE REM PT RTRN 9FT ADLT (ELECTROSURGICAL) ×1 IMPLANT
GLOVE BIO SURGEON STRL SZ 6.5 (GLOVE) ×1 IMPLANT
GLOVE BIO SURGEON STRL SZ7.5 (GLOVE) ×2 IMPLANT
GLOVE BIOGEL PI IND STRL 6.5 (GLOVE) IMPLANT
GLOVE BIOGEL PI INDICATOR 6.5 (GLOVE) ×2
GLOVE ECLIPSE 6.5 STRL STRAW (GLOVE) ×1 IMPLANT
GOWN STRL REUS W/ TWL LRG LVL3 (GOWN DISPOSABLE) ×2 IMPLANT
GOWN STRL REUS W/ TWL XL LVL3 (GOWN DISPOSABLE) ×1 IMPLANT
GOWN STRL REUS W/TWL LRG LVL3 (GOWN DISPOSABLE) ×4
GOWN STRL REUS W/TWL XL LVL3 (GOWN DISPOSABLE) ×2
INSERT FOGARTY SM (MISCELLANEOUS) IMPLANT
KIT BASIN OR (CUSTOM PROCEDURE TRAY) ×2 IMPLANT
KIT TURNOVER KIT B (KITS) ×2 IMPLANT
NS IRRIG 1000ML POUR BTL (IV SOLUTION) ×2 IMPLANT
PACK CV ACCESS (CUSTOM PROCEDURE TRAY) ×2 IMPLANT
PAD ARMBOARD 7.5X6 YLW CONV (MISCELLANEOUS) ×4 IMPLANT
SUT MNCRL AB 4-0 PS2 18 (SUTURE) ×2 IMPLANT
SUT PROLENE 6 0 BV (SUTURE) ×2 IMPLANT
SUT SILK 4 0 (SUTURE) ×2
SUT SILK 4-0 18XBRD TIE 12 (SUTURE) IMPLANT
SUT VIC AB 3-0 SH 27 (SUTURE) ×2
SUT VIC AB 3-0 SH 27X BRD (SUTURE) ×1 IMPLANT
TOWEL GREEN STERILE (TOWEL DISPOSABLE) ×2 IMPLANT
UNDERPAD 30X30 (UNDERPADS AND DIAPERS) ×2 IMPLANT
WATER STERILE IRR 1000ML POUR (IV SOLUTION) ×2 IMPLANT

## 2019-05-19 NOTE — H&P (Signed)
   History and Physical Update  The patient was interviewed and re-examined.  The patient's previous History and Physical has been reviewed and is unchanged from recent office visit. Plan is for left arm avf vs graft today in OR.   [name] [name] C. [name], MD [facility] [place] Office: [phone] Pager: [phone]   05/19/2019, 7:16 AM

## 2019-05-19 NOTE — Transfer of Care (Signed)
Immediate Anesthesia Transfer of Care Note  Patient: [name] [name]  Procedure(s) Performed: LEFT ARM Brachiocephalic ARTERIOVENOUS (AV) FISTULA CREATION (Left Arm Upper)  Patient Location: PACU  Anesthesia Type:MAC  Level of Consciousness: drowsy  Airway & Oxygen Therapy: Patient Spontanous Breathing and Patient connected to face mask oxygen  Post-op Assessment: Report given to RN and Post -op Vital signs reviewed and stable  Post vital signs: Reviewed  Last Vitals:  Vitals Value Taken Time  BP 107/53 05/19/19 0846  Temp    Pulse 66 05/19/19 0847  Resp 19 05/19/19 0847  SpO2 100 % 05/19/19 0847  Vitals shown include unvalidated device data.  Last Pain:  Vitals:   05/19/19 0705  TempSrc:   PainSc: 5       Patients Stated Pain Goal: 5 (77/93/90 3009)  Complications: No apparent anesthesia complications

## 2019-05-19 NOTE — Anesthesia Procedure Notes (Signed)
Procedure Name: MAC Date/Time: 05/19/2019 7:33 AM Performed by: Janene Harvey, CRNA Pre-anesthesia Checklist: Patient identified, Emergency Drugs available, Suction available and Patient being monitored Patient Re-evaluated:Patient Re-evaluated prior to induction Oxygen Delivery Method: Simple face mask Placement Confirmation: positive ETCO2 Dental Injury: Teeth and Oropharynx as per pre-operative assessment

## 2019-05-19 NOTE — Op Note (Signed)
    Patient name: [name] [name] MRN: [id number] DOB: 07/16/1960 Sex: female  05/19/2019 Pre-operative Diagnosis: ckd Post-operative diagnosis:  Same Surgeon:  Erlene Quan C. [name], MD Assistant: Arlee Muslim, PA Procedure Performed:  Left arm brachial artery to basilic vein av fistula creation  Indications: 59 year old female with history of chronic kidney disease.  She is now indicated for dialysis access.  She is right-hand dominant.  We will plan for left arm access.  Findings: The cephalic vein was large just below the antecubitum.  After dissecting this out this was thoroughly scarred.  We are unable to use this after attempting to dilate.  We then turned our attention to the basilic vein.  Above the antecubitum was larger but below the antecubitum we were able to dilate this to 4 mm.  At completion there was a strong thrill in the vein and a palpable radial pulse at the wrist both confirmed with Doppler.   Procedure:  The patient was identified in the holding area and taken to the operating where she is placed upon operative 1 MAC anesthesia was induced.  She was sterilely prepped draped in the left upper extremity usual fashion antibiotics were minister timeout was called.  We began using ultrasound we identified both the basilic and cephalic vein.  We used 1% lidocaine with epinephrine to anesthetize below the antecubitum.  We made a transverse incision.  And initially dissected out the cephalic vein.  This was heavily scarred.  I did transected distally and tied off with 2-0 silk suture.  I attempted to dilate but with a 2.5 mm dilator the vein did tear.  I then tied it off more cephalad on the arm.  I turned my attention to the basilic vein.  I dissected this out by extending the incision.  The vein was somewhat diminutive.  I transected distally and tied off the distal end with 2-0 silk suture.  I serially dilated to 4 mm starting with 1.5.  I flushed with heparinized saline.  It looked adequate for  fistula creation.  I then dissected through the deep fascia to the brachial artery.  I placed a vessel loop around this.  The arteries clamped distally and proximally opened longitudinally flushed with heparinized saline distally.  I then spatulated the vein and sewed end-to-side with 6-0 Prolene suture.  Prior to completion we allowed flushing all directions.  Upon completion there was a good thrill in the vein.  This was confirmed with Doppler.  We did free up some soft tissue around the vein to allow to sit nicely in the wound bed.  There is a palpable radial artery pulse at the wrist confirmed with Doppler.  Satisfied we irrigated the wound.  We obtain hemostasis and closed in layers with Vicryl and Monocryl.  Dermabond is placed at the level of the skin.  She was awake from anesthesia having tolerated procedure well immediate complication.  All counts were correct at completion.  EBL: 50 cc    Chassie Pennix C. [name], MD [facility] [place] Office: [phone] Pager: [phone]

## 2019-05-19 NOTE — Discharge Instructions (Signed)
° °  [facility] [place] ° °Discharge Instructions ° °AV Fistula or Graft Surgery for Dialysis Access ° °Please refer to the following instructions for your post-procedure care. Your surgeon or physician assistant will discuss any changes with you. ° °Activity ° °You may drive the day following your surgery, if you are comfortable and no longer taking prescription pain medication. Resume full activity as the soreness in your incision resolves. ° °Bathing/Showering ° °You may shower after you go home. Keep your incision dry for 48 hours. Do not soak in a bathtub, hot tub, or swim until the incision heals completely. You may not shower if you have a hemodialysis catheter. ° °Incision Care ° °Clean your incision with mild soap and water after 48 hours. Pat the area dry with a clean towel. You do not need a bandage unless otherwise instructed. Do not apply any ointments or creams to your incision. You may have skin glue on your incision. Do not peel it off. It will come off on its own in about one week. Your arm may swell a bit after surgery. To reduce swelling use pillows to elevate your arm so it is above your heart. Your doctor will tell you if you need to lightly wrap your arm with an ACE bandage. ° °Diet ° °Resume your normal diet. There are not special food restrictions following this procedure. In order to heal from your surgery, it is CRITICAL to get adequate nutrition. Your body requires vitamins, minerals, and protein. Vegetables are the best source of vitamins and minerals. Vegetables also provide the perfect balance of protein. Processed food has little nutritional value, so try to avoid this. ° °Medications ° °Resume taking all of your medications. If your incision is causing pain, you may take over-the counter pain relievers such as acetaminophen (Tylenol). If you were prescribed a stronger pain medication, please be aware these medications can cause nausea and constipation. Prevent  nausea by taking the medication with a snack or meal. Avoid constipation by drinking plenty of fluids and eating foods with high amount of fiber, such as fruits, vegetables, and grains. Do not take Tylenol if you are taking prescription pain medications. ° ° ° ° °Follow up °Your surgeon may want to see you in the office following your access surgery. If so, this will be arranged at the time of your surgery. ° °Please call us immediately for any of the following conditions: ° °Increased pain, redness, drainage (pus) from your incision site °Fever of 101 degrees or higher °Severe or worsening pain at your incision site °Hand pain or numbness. ° °Reduce your risk of vascular disease: ° °Stop smoking. If you would like help, call [facility] at 1-800-QUIT-NOW (1-800-784-8669) or [facility] at 336-586-4000 ° °Manage your cholesterol °Maintain a desired weight °Control your diabetes °Keep your blood pressure down ° °Dialysis ° °It will take several weeks to several months for your new dialysis access to be ready for use. Your surgeon will determine when it is OK to use it. Your nephrologist will continue to direct your dialysis. You can continue to use your Permcath until your new access is ready for use. ° °If you have any questions, please call the office at [phone]. ° °

## 2019-05-19 NOTE — Anesthesia Postprocedure Evaluation (Signed)
Anesthesia Post Note  Patient: [name] [name]  Procedure(s) Performed: LEFT ARM Brachiocephalic ARTERIOVENOUS (AV) FISTULA CREATION (Left Arm Upper)     Patient location during evaluation: PACU Anesthesia Type: MAC Level of consciousness: awake and alert Pain management: pain level controlled Vital Signs Assessment: post-procedure vital signs reviewed and stable Respiratory status: spontaneous breathing, nonlabored ventilation, respiratory function stable and patient connected to nasal cannula oxygen Cardiovascular status: stable and blood pressure returned to baseline Postop Assessment: no apparent nausea or vomiting Anesthetic complications: no    Last Vitals:  Vitals:   05/19/19 0915 05/19/19 0923  BP: 119/61   Pulse: 65 62  Resp: 15 18  Temp:  36.8 C  SpO2: 99% 100%    Last Pain:  Vitals:   05/19/19 0923  TempSrc:   PainSc: 0-No pain                 Effie Berkshire

## 2019-06-26 ENCOUNTER — Other Ambulatory Visit: Payer: Self-pay

## 2019-06-26 ENCOUNTER — Encounter: Payer: Self-pay | Admitting: Vascular Surgery

## 2019-06-26 ENCOUNTER — Ambulatory Visit (INDEPENDENT_AMBULATORY_CARE_PROVIDER_SITE_OTHER): Payer: Medicaid Other | Admitting: Vascular Surgery

## 2019-06-26 VITALS — BP 186/103 | HR 68 | Resp 20 | Ht 65.0 in | Wt 262.0 lb

## 2019-06-26 DIAGNOSIS — N184 Chronic kidney disease, stage 4 (severe): Secondary | ICD-10-CM | POA: Diagnosis not present

## 2019-06-26 NOTE — Progress Notes (Signed)
Patient ID: [name] [name], female   DOB: Jun 01, 1960, 59 y.o.   MRN: [id number]  Reason for Consult: Follow-up   Referred by [name] Sanes, MD  Subjective:     HPI:  [name] [name] is a 59 y.o. female history of chronic kidney disease.  She recent underwent left for stage basilic vein fistula this is unfortunately clotted.  She is right-hand dominant with prefer to avoid any surgery in her right arm.  She does not have any left hand issues.  Wounds are well-healed although she does have some pain there.  Past Medical History:  Diagnosis Date  . Anemia   . Anxiety   . Arthritis    "hips" (08/28/2013)  . Asthma   . Chronic kidney disease   . Chronic lower back pain    10-27-13 not a problem at this time  . Depression   . Esophageal spasm   . Frequent UTI   . GERD (gastroesophageal reflux disease)   . Hepatitis C    genotype 1B  . High cholesterol   . Hypertension   . Migraine    "monthly" (08/28/2013)  . Pneumonia    "several times"  . Type II diabetes mellitus ([place] [address])   . Ulcer    hx gastric ulcers   Family History  Problem Relation Age of Onset  . Hypertension Mother   . Alzheimer's disease Father   . Throat cancer Sister 20       died at age 31  . Colon cancer Neg Hx   . Rectal cancer Neg Hx   . Stomach cancer Neg Hx    Past Surgical History:  Procedure Laterality Date  . ABDOMINAL HYSTERECTOMY    . APPENDECTOMY    . AV FISTULA PLACEMENT Left 05/19/2019   Procedure: LEFT ARM Brachiocephalic ARTERIOVENOUS (AV) FISTULA CREATION;  Surgeon: Waynetta Sandy, MD;  Location: [facility];  Service: Vascular;  Laterality: Left;  . BALLOON DILATION N/A 12/29/2012   Procedure: BALLOON DILATION;  Surgeon: Inda Castle, MD;  Location: [facility];  Service: Endoscopy;  Laterality: N/A;  . BALLOON DILATION N/A 06/25/2013   Procedure: BALLOON DILATION;  Surgeon: Inda Castle, MD;  Location: [facility];  Service: Endoscopy;  Laterality: N/A;  . BOTOX INJECTION   12/29/2012   Procedure: BOTOX INJECTION;  Surgeon: Inda Castle, MD;  Location: [facility];  Service: Endoscopy;;  . BOTOX INJECTION N/A 06/25/2013   Procedure: BOTOX INJECTION;  Surgeon: Inda Castle, MD;  Location: [facility];  Service: Endoscopy;  Laterality: N/A;  . CARDIAC CATHETERIZATION  X 2   /notes 08/28/2013  . CARPAL TUNNEL RELEASE Right   . CHOLECYSTECTOMY    . COLONOSCOPY  04/10/2012   Normal   . ESOPHAGEAL MANOMETRY N/A 12/26/2015   Procedure: ESOPHAGEAL MANOMETRY (EM);  Surgeon: Doran Stabler, MD;  Location: [facility];  Service: Gastroenterology;  Laterality: N/A;  . ESOPHAGOGASTRODUODENOSCOPY N/A 12/29/2012   Procedure: ESOPHAGOGASTRODUODENOSCOPY (EGD);  Surgeon: Inda Castle, MD;  Location: [facility];  Service: Endoscopy;  Laterality: N/A;  . ESOPHAGOGASTRODUODENOSCOPY N/A 06/25/2013   Procedure: ESOPHAGOGASTRODUODENOSCOPY (EGD);  Surgeon: Inda Castle, MD;  Location: [facility];  Service: Endoscopy;  Laterality: N/A;  . ESOPHAGOGASTRODUODENOSCOPY N/A 11/03/2013   Procedure: ESOPHAGOGASTRODUODENOSCOPY (EGD);  Surgeon: Inda Castle, MD;  Location: [facility];  Service: Endoscopy;  Laterality: N/A;  Botox - ballon dilation  . ESOPHAGOGASTRODUODENOSCOPY N/A 08/14/2016   Procedure: ESOPHAGOGASTRODUODENOSCOPY (EGD);  Surgeon: Milus Banister, MD;  Location: [facility]  ENDOSCOPY;  Service: Endoscopy;  Laterality: N/A;  . FOOT FRACTURE SURGERY Right   . HEEL SPUR EXCISION Right   . TONSILLECTOMY    . TUBAL LIGATION      Short Social History:  Social History   Tobacco Use  . Smoking status: Never Smoker  . Smokeless tobacco: Never Used  Substance Use Topics  . Alcohol use: No    Allergies  Allergen Reactions  . Hydrochlorothiazide Itching  . Amlodipine     Foot swelling/chest pain   . Duloxetine     Hallucinations   . Iodinated Diagnostic Agents     Pt is unaware of this allergy  . Lisinopril Cough  . Metformin     Unknown reaction  .  Codeine Itching    No hives or rash, takes benadryl  . Dilaudid [Hydromorphone Hcl] Itching    "mild itching hrs after dilaudid given"    Current Outpatient Medications  Medication Sig Dispense Refill  . acetaminophen (TYLENOL) 500 MG tablet Take 1,000 mg by mouth every 4 (four) hours as needed for moderate pain.     Marland Kitchen albuterol (PROAIR HFA) 108 (90 Base) MCG/ACT inhaler Inhale 2 puffs into the lungs every 4 (four) hours as needed for wheezing or shortness of breath.     Marland Kitchen ammonium lactate (LAC-HYDRIN) 12 % lotion Apply 1 application topically 2 (two) times daily as needed (itchy dry skin).     . calcitRIOL (ROCALTROL) 0.25 MCG capsule Take 0.25 mcg by mouth daily.    . Carboxymethylcellul-Glycerin (LUBRICATING EYE DROPS OP) Place 1 drop into both eyes daily as needed (dry eyes).    . Cholecalciferol (VITAMIN D) 50 MCG (2000 UT) tablet Take 2,000 Units by mouth 2 (two) times a week.    . cloNIDine (CATAPRES) 0.1 MG tablet Take 0.2 mg by mouth 2 (two) times daily.     . cyclobenzaprine (FLEXERIL) 10 MG tablet Take 10 mg by mouth daily as needed for muscle spasms.    Marland Kitchen gabapentin (NEURONTIN) 300 MG capsule Take 1 capsule (300 mg total) by mouth 3 (three) times daily. (Patient taking differently: Take 300 mg by mouth 2 (two) times daily as needed (pain). ) 90 capsule 3  . glimepiride (AMARYL) 4 MG tablet Take 4 mg by mouth in the morning and at bedtime.     Marland Kitchen HUMULIN N 100 UNIT/ML injection Inject 17-22 Units into the skin See admin instructions. Inject 22 units in the morning, 17 units in the evening    . labetalol (NORMODYNE) 200 MG tablet Take 200 mg by mouth 2 (two) times daily. Takes with 300 mg to equal 500 mg twice daily    . labetalol (NORMODYNE) 300 MG tablet Take 300 mg by mouth 2 (two) times daily. Takes with 200 mg to equal 500 mg twice daily    . lidocaine (LIDODERM) 5 % Place 1 patch onto the skin daily as needed for pain.    . Magnesium 400 MG TABS Take 400 mg by mouth daily.    .  nitroGLYCERIN (NITROSTAT) 0.4 MG SL tablet Place 0.4 mg under the tongue every 5 (five) minutes as needed for chest pain.    Marland Kitchen omeprazole (PRILOSEC) 20 MG capsule Take 1 capsule (20 mg total) by mouth 2 (two) times daily before a meal. (Patient taking differently: Take 20 mg by mouth daily as needed (ulcers). ) 28 capsule 0  . oxymetazoline (AFRIN) 0.05 % nasal spray Place 1 spray into both nostrils 2 (two) times daily as  needed for congestion.    . sodium bicarbonate 650 MG tablet Take 1,300 mg by mouth 2 (two) times daily.     . sodium zirconium cyclosilicate (LOKELMA) 10 g PACK packet Take 10 g by mouth every Monday, Wednesday, and Friday.     . traMADol (ULTRAM) 50 MG tablet Take 1 tablet (50 mg total) by mouth every 6 (six) hours as needed for moderate pain. 15 tablet 0   No current facility-administered medications for this visit.    Review of Systems  Constitutional:  Constitutional negative. HENT: HENT negative.  Eyes: Eyes negative.  Respiratory: Respiratory negative.  Cardiovascular: Cardiovascular negative.  GI: Gastrointestinal negative.  Musculoskeletal: Musculoskeletal negative.  Skin: Skin negative.  Neurological: Neurological negative. Hematologic: Hematologic/lymphatic negative.  Psychiatric: Psychiatric negative.        Objective:  Objective   Vitals:   06/26/19 1342  BP: (!) 186/103  Pulse: 68  Resp: 20  SpO2: 97%  Weight: 262 lb (118.8 kg)  Height: 5\' 5"  (1.651 m)   Body mass index is 43.6 kg/m.  Physical Exam HENT:     Head: Normocephalic.     Nose: Nose normal.  Eyes:     Pupils: Pupils are equal, round, and reactive to light.  Cardiovascular:     Rate and Rhythm: Normal rate.     Pulses:          Radial pulses are 2+ on the right side and 2+ on the left side.  Pulmonary:     Effort: Pulmonary effort is normal.  Abdominal:     General: Abdomen is flat.     Palpations: Abdomen is soft. There is no mass.  Musculoskeletal:        General: No  swelling. Normal range of motion.  Skin:    General: Skin is warm and dry.     Capillary Refill: Capillary refill takes less than 2 seconds.  Neurological:     General: No focal deficit present.     Mental Status: She is alert.  Psychiatric:        Mood and Affect: Mood normal.        Behavior: Behavior normal.        Thought Content: Thought content normal.        Judgment: Judgment normal.     Data: I use ultrasound at the bedside today and identified a basilic vein above the antecubitum that is likely suitable for AV fistula creation.     Assessment/Plan:     59 year old female with chronic kidney disease has a failed left arm basilic vein fistula and at the time we also explored the cephalic vein and this was not suitable.  I discussed with her the options being graft on the left versus proceeding with fistula on the right or graft on the right.  We also evaluated in the office today she might have a suitable basilic vein on the left.  Patient would prefer to keep access on the left side.  We will plan for left arm above the antecubitum basilic vein fistula creation versus graft in the near future.     Waynetta Sandy MD [facility]

## 2019-06-29 ENCOUNTER — Other Ambulatory Visit: Payer: Self-pay | Admitting: Podiatry

## 2019-07-01 ENCOUNTER — Encounter (HOSPITAL_COMMUNITY): Payer: Self-pay | Admitting: Vascular Surgery

## 2019-07-01 ENCOUNTER — Other Ambulatory Visit (HOSPITAL_COMMUNITY)
Admission: RE | Admit: 2019-07-01 | Discharge: 2019-07-01 | Disposition: A | Discharge status: Home / Self Care | Payer: Medicaid Other | Source: Ambulatory Visit | Attending: Vascular Surgery | Admitting: Vascular Surgery

## 2019-07-01 ENCOUNTER — Other Ambulatory Visit: Payer: Self-pay

## 2019-07-01 DIAGNOSIS — Z01812 Encounter for preprocedural laboratory examination: Secondary | ICD-10-CM | POA: Diagnosis present

## 2019-07-01 DIAGNOSIS — Z20822 Contact with and (suspected) exposure to covid-19: Secondary | ICD-10-CM | POA: Diagnosis not present

## 2019-07-01 MED ORDER — DEXTROSE 5 % IV SOLN
3.0000 g | INTRAVENOUS | Status: DC
  Filled 2019-07-01 (×2): qty 3000

## 2019-07-01 NOTE — Progress Notes (Signed)
Pt denies any acute cardiopulmonary issues. Pt stated that she is under the care of Dr. Lennox Pippins, Cardiology and Dr. Spero Curb, PCP. Pt made aware to stop taking  vitamins, fish oil and herbal medications. Do not take any NSAIDs ie: Ibuprofen, Advil, Naproxen (Aleve), Motrin, BC and Goody Powder. Pt made aware to hold Glimepiride at HS and DOS. Pt made aware to talk 50% of Humulin N insulin at HS. Pt made aware to take 50% of Humulin N insulin DOS if CBG is > 70. Pt made aware to check CBG every 2 hours prior to arrival to hospital on DOS. Do not take insulin if CBG is < 70. Pt made aware to treat a CBG < 70 with 4 ounces of apple or cranberry juice, wait 15 minutes after intervention to recheck BG, if BG remains < 70, call Short Stay unit to speak with a nurse. Pt reminded to quarantine. Pt verbalized understanding of all pre-op instructions.

## 2019-07-02 ENCOUNTER — Ambulatory Visit (HOSPITAL_COMMUNITY): Payer: Medicaid Other | Admitting: Anesthesiology

## 2019-07-02 ENCOUNTER — Ambulatory Visit (HOSPITAL_COMMUNITY)
Admission: RE | Admit: 2019-07-02 | Discharge: 2019-07-02 | Disposition: A | Discharge status: Other Acute Inpatient Hospital | Payer: Medicaid Other | Source: Home / Self Care | Attending: Vascular Surgery | Admitting: Vascular Surgery

## 2019-07-02 ENCOUNTER — Encounter (HOSPITAL_COMMUNITY): Payer: Self-pay | Admitting: Vascular Surgery

## 2019-07-02 ENCOUNTER — Emergency Department (HOSPITAL_COMMUNITY): Payer: Medicaid Other

## 2019-07-02 ENCOUNTER — Inpatient Hospital Stay (HOSPITAL_COMMUNITY)
Admission: EM | Admit: 2019-07-02 | Discharge: 2019-07-06 | DRG: 305 | Disposition: A | Discharge status: Home / Self Care | Payer: Medicaid Other | Attending: Internal Medicine | Admitting: Internal Medicine

## 2019-07-02 ENCOUNTER — Encounter (HOSPITAL_COMMUNITY)
Admission: RE | Disposition: A | Discharge status: Other Acute Inpatient Hospital | Payer: Self-pay | Source: Home / Self Care | Attending: Vascular Surgery

## 2019-07-02 DIAGNOSIS — I120 Hypertensive chronic kidney disease with stage 5 chronic kidney disease or end stage renal disease: Secondary | ICD-10-CM | POA: Diagnosis present

## 2019-07-02 DIAGNOSIS — J45909 Unspecified asthma, uncomplicated: Secondary | ICD-10-CM | POA: Diagnosis present

## 2019-07-02 DIAGNOSIS — N185 Chronic kidney disease, stage 5: Secondary | ICD-10-CM | POA: Diagnosis present

## 2019-07-02 DIAGNOSIS — E119 Type 2 diabetes mellitus without complications: Secondary | ICD-10-CM

## 2019-07-02 DIAGNOSIS — I10 Essential (primary) hypertension: Secondary | ICD-10-CM | POA: Insufficient documentation

## 2019-07-02 DIAGNOSIS — R079 Chest pain, unspecified: Secondary | ICD-10-CM | POA: Diagnosis present

## 2019-07-02 DIAGNOSIS — R0989 Other specified symptoms and signs involving the circulatory and respiratory systems: Secondary | ICD-10-CM | POA: Diagnosis present

## 2019-07-02 DIAGNOSIS — Z20822 Contact with and (suspected) exposure to covid-19: Secondary | ICD-10-CM | POA: Diagnosis present

## 2019-07-02 DIAGNOSIS — I160 Hypertensive urgency: Secondary | ICD-10-CM

## 2019-07-02 DIAGNOSIS — B1920 Unspecified viral hepatitis C without hepatic coma: Secondary | ICD-10-CM | POA: Diagnosis present

## 2019-07-02 DIAGNOSIS — Z91041 Radiographic dye allergy status: Secondary | ICD-10-CM

## 2019-07-02 DIAGNOSIS — F329 Major depressive disorder, single episode, unspecified: Secondary | ICD-10-CM | POA: Diagnosis present

## 2019-07-02 DIAGNOSIS — Z538 Procedure and treatment not carried out for other reasons: Secondary | ICD-10-CM | POA: Insufficient documentation

## 2019-07-02 DIAGNOSIS — E1122 Type 2 diabetes mellitus with diabetic chronic kidney disease: Secondary | ICD-10-CM | POA: Diagnosis present

## 2019-07-02 DIAGNOSIS — Z8711 Personal history of peptic ulcer disease: Secondary | ICD-10-CM

## 2019-07-02 DIAGNOSIS — Z885 Allergy status to narcotic agent status: Secondary | ICD-10-CM

## 2019-07-02 DIAGNOSIS — I161 Hypertensive emergency: Secondary | ICD-10-CM

## 2019-07-02 DIAGNOSIS — Z794 Long term (current) use of insulin: Secondary | ICD-10-CM

## 2019-07-02 DIAGNOSIS — E785 Hyperlipidemia, unspecified: Secondary | ICD-10-CM | POA: Diagnosis present

## 2019-07-02 DIAGNOSIS — Z79899 Other long term (current) drug therapy: Secondary | ICD-10-CM

## 2019-07-02 DIAGNOSIS — Z888 Allergy status to other drugs, medicaments and biological substances status: Secondary | ICD-10-CM

## 2019-07-02 DIAGNOSIS — K219 Gastro-esophageal reflux disease without esophagitis: Secondary | ICD-10-CM | POA: Diagnosis present

## 2019-07-02 DIAGNOSIS — M160 Bilateral primary osteoarthritis of hip: Secondary | ICD-10-CM | POA: Diagnosis present

## 2019-07-02 HISTORY — DX: Type 2 diabetes mellitus without complications: E11.9

## 2019-07-02 HISTORY — DX: Chronic kidney disease, stage 5: N18.5

## 2019-07-02 LAB — CBC
HCT: 31.5 % — ABNORMAL LOW (ref 36.0–46.0)
Hemoglobin: 10.3 g/dL — ABNORMAL LOW (ref 12.0–15.0)
MCH: 26.9 pg (ref 26.0–34.0)
MCHC: 32.7 g/dL (ref 30.0–36.0)
MCV: 82.2 fL (ref 80.0–100.0)
Platelets: 283 10*3/uL (ref 150–400)
RBC: 3.83 MIL/uL — ABNORMAL LOW (ref 3.87–5.11)
RDW: 15.9 % — ABNORMAL HIGH (ref 11.5–15.5)
WBC: 12.3 10*3/uL — ABNORMAL HIGH (ref 4.0–10.5)
nRBC: 0 % (ref 0.0–0.2)

## 2019-07-02 LAB — POCT I-STAT, CHEM 8
BUN: 37 mg/dL — ABNORMAL HIGH (ref 6–20)
Calcium, Ion: 1.23 mmol/L (ref 1.15–1.40)
Chloride: 108 mmol/L (ref 98–111)
Creatinine, Ser: 4.3 mg/dL — ABNORMAL HIGH (ref 0.44–1.00)
Glucose, Bld: 207 mg/dL — ABNORMAL HIGH (ref 70–99)
HCT: 34 % — ABNORMAL LOW (ref 36.0–46.0)
Hemoglobin: 11.6 g/dL — ABNORMAL LOW (ref 12.0–15.0)
Potassium: 5.4 mmol/L — ABNORMAL HIGH (ref 3.5–5.1)
Sodium: 139 mmol/L (ref 135–145)
TCO2: 24 mmol/L (ref 22–32)

## 2019-07-02 LAB — BASIC METABOLIC PANEL
Anion gap: 10 (ref 5–15)
BUN: 38 mg/dL — ABNORMAL HIGH (ref 6–20)
CO2: 22 mmol/L (ref 22–32)
Calcium: 9 mg/dL (ref 8.9–10.3)
Chloride: 107 mmol/L (ref 98–111)
Creatinine, Ser: 3.93 mg/dL — ABNORMAL HIGH (ref 0.44–1.00)
GFR calc Af Amer: 14 mL/min — ABNORMAL LOW (ref >60)
GFR calc non Af Amer: 12 mL/min — ABNORMAL LOW (ref >60)
Glucose, Bld: 172 mg/dL — ABNORMAL HIGH (ref 70–99)
Potassium: 5.2 mmol/L — ABNORMAL HIGH (ref 3.5–5.1)
Sodium: 139 mmol/L (ref 135–145)

## 2019-07-02 LAB — HEPATIC FUNCTION PANEL
ALT: 15 U/L (ref 0–44)
AST: 17 U/L (ref 15–41)
Albumin: 3.1 g/dL — ABNORMAL LOW (ref 3.5–5.0)
Alkaline Phosphatase: 144 U/L — ABNORMAL HIGH (ref 38–126)
Bilirubin, Direct: <0.1 mg/dL (ref 0.0–0.2)
Total Bilirubin: 0.5 mg/dL (ref 0.3–1.2)
Total Protein: 6.6 g/dL (ref 6.5–8.1)

## 2019-07-02 LAB — GLUCOSE, CAPILLARY
Glucose-Capillary: 232 mg/dL — ABNORMAL HIGH (ref 70–99)
Glucose-Capillary: 246 mg/dL — ABNORMAL HIGH (ref 70–99)

## 2019-07-02 LAB — TROPONIN I (HIGH SENSITIVITY)
Troponin I (High Sensitivity): 10 ng/L (ref <18)
Troponin I (High Sensitivity): 11 ng/L (ref <18)

## 2019-07-02 LAB — SARS CORONAVIRUS 2 (TAT 6-24 HRS): SARS Coronavirus 2: NEGATIVE

## 2019-07-02 LAB — LIPASE, BLOOD: Lipase: 38 U/L (ref 11–51)

## 2019-07-02 SURGERY — ARTERIOVENOUS (AV) FISTULA CREATION
Anesthesia: Monitor Anesthesia Care | Laterality: Left

## 2019-07-02 MED ORDER — HEPARIN SODIUM (PORCINE) 5000 UNIT/ML IJ SOLN
5000.0000 [IU] | Freq: Three times a day (TID) | INTRAMUSCULAR | Status: DC
  Administered 2019-07-03 – 2019-07-06 (×10): 5000 [IU] via SUBCUTANEOUS
  Filled 2019-07-02 (×11): qty 1

## 2019-07-02 MED ORDER — SODIUM CHLORIDE 0.9% FLUSH: 3.0000 mL | Freq: Once | INTRAVENOUS | Status: DC

## 2019-07-02 MED ORDER — ACETAMINOPHEN 650 MG RE SUPP: 650.0000 mg | Freq: Four times a day (QID) | RECTAL | Status: DC | PRN

## 2019-07-02 MED ORDER — INSULIN ASPART 100 UNIT/ML ~~LOC~~ SOLN
0.0000 [IU] | Freq: Three times a day (TID) | SUBCUTANEOUS | Status: DC
  Administered 2019-07-03: 2 [IU] via SUBCUTANEOUS
  Administered 2019-07-03: 3 [IU] via SUBCUTANEOUS
  Administered 2019-07-04: 1 [IU] via SUBCUTANEOUS
  Administered 2019-07-04 – 2019-07-05 (×2): 2 [IU] via SUBCUTANEOUS
  Administered 2019-07-05: 1 [IU] via SUBCUTANEOUS

## 2019-07-02 MED ORDER — INSULIN ASPART 100 UNIT/ML ~~LOC~~ SOLN: 0.0000 [IU] | Freq: Every day | SUBCUTANEOUS | Status: DC

## 2019-07-02 MED ORDER — CALCITRIOL 0.25 MCG PO CAPS
0.2500 ug | ORAL_CAPSULE | Freq: Two times a day (BID) | ORAL | Status: DC
  Administered 2019-07-02 – 2019-07-06 (×8): 0.25 ug via ORAL
  Filled 2019-07-02 (×9): qty 1

## 2019-07-02 MED ORDER — LABETALOL HCL 5 MG/ML IV SOLN
10.0000 mg | INTRAVENOUS | Status: DC | PRN
  Administered 2019-07-03 – 2019-07-04 (×7): 10 mg via INTRAVENOUS
  Filled 2019-07-02 (×7): qty 4

## 2019-07-02 MED ORDER — INSULIN NPH (HUMAN) (ISOPHANE) 100 UNIT/ML ~~LOC~~ SUSP
20.0000 [IU] | Freq: Two times a day (BID) | SUBCUTANEOUS | Status: DC
  Administered 2019-07-03 – 2019-07-06 (×8): 20 [IU] via SUBCUTANEOUS
  Filled 2019-07-02: qty 10

## 2019-07-02 MED ORDER — LABETALOL HCL 5 MG/ML IV SOLN: 10.0000 mg | INTRAVENOUS | Status: DC | PRN

## 2019-07-02 MED ORDER — SODIUM ZIRCONIUM CYCLOSILICATE 10 G PO PACK
10.0000 g | PACK | Freq: Every day | ORAL | Status: DC
  Administered 2019-07-03 – 2019-07-06 (×4): 10 g via ORAL
  Filled 2019-07-02 (×4): qty 1

## 2019-07-02 MED ORDER — HYDRALAZINE HCL 20 MG/ML IJ SOLN
10.0000 mg | INTRAMUSCULAR | Status: DC | PRN
  Administered 2019-07-03 – 2019-07-05 (×5): 10 mg via INTRAVENOUS
  Filled 2019-07-02 (×5): qty 1

## 2019-07-02 MED ORDER — ACETAMINOPHEN 325 MG PO TABS
650.0000 mg | ORAL_TABLET | Freq: Four times a day (QID) | ORAL | Status: DC | PRN
  Administered 2019-07-03 – 2019-07-05 (×6): 650 mg via ORAL
  Filled 2019-07-02 (×6): qty 2

## 2019-07-02 MED ORDER — SODIUM CHLORIDE 0.9 % IV SOLN
INTRAVENOUS | Status: AC
  Filled 2019-07-02: qty 1.2

## 2019-07-02 MED ORDER — CHLORHEXIDINE GLUCONATE 0.12 % MT SOLN
OROMUCOSAL | Status: AC
  Administered 2019-07-02: 15 mL via OROMUCOSAL
  Filled 2019-07-02: qty 15

## 2019-07-02 MED ORDER — CHLORHEXIDINE GLUCONATE 4 % EX LIQD: 60.0000 mL | Freq: Once | CUTANEOUS | Status: DC

## 2019-07-02 MED ORDER — SODIUM BICARBONATE 650 MG PO TABS
1300.0000 mg | ORAL_TABLET | Freq: Two times a day (BID) | ORAL | Status: DC
  Administered 2019-07-02 – 2019-07-06 (×8): 1300 mg via ORAL
  Filled 2019-07-02 (×9): qty 2

## 2019-07-02 MED ORDER — LABETALOL HCL 5 MG/ML IV SOLN: 10.0000 mg | Freq: Once | INTRAVENOUS | Status: DC

## 2019-07-02 MED ORDER — TRAMADOL HCL 50 MG PO TABS
50.0000 mg | ORAL_TABLET | Freq: Four times a day (QID) | ORAL | Status: DC | PRN
  Administered 2019-07-03 – 2019-07-05 (×8): 50 mg via ORAL
  Filled 2019-07-02 (×8): qty 1

## 2019-07-02 MED ORDER — LIDOCAINE HCL (PF) 1 % IJ SOLN
INTRAMUSCULAR | Status: AC
  Filled 2019-07-02: qty 30

## 2019-07-02 MED ORDER — SODIUM CHLORIDE 0.9 % IV SOLN: INTRAVENOUS | Status: DC

## 2019-07-02 MED ORDER — CLONIDINE HCL 0.2 MG PO TABS
0.2000 mg | ORAL_TABLET | Freq: Two times a day (BID) | ORAL | Status: DC
  Administered 2019-07-02: 0.2 mg via ORAL
  Filled 2019-07-02: qty 1

## 2019-07-02 MED ORDER — LABETALOL HCL 5 MG/ML IV SOLN
10.0000 mg | Freq: Once | INTRAVENOUS | Status: AC
  Administered 2019-07-02: 10 mg via INTRAVENOUS
  Filled 2019-07-02: qty 4

## 2019-07-02 MED ORDER — FENTANYL CITRATE (PF) 250 MCG/5ML IJ SOLN
INTRAMUSCULAR | Status: AC
  Filled 2019-07-02: qty 5

## 2019-07-02 MED ORDER — HYDRALAZINE HCL 20 MG/ML IJ SOLN: 10.0000 mg | INTRAMUSCULAR | Status: DC | PRN

## 2019-07-02 MED ORDER — CHLORHEXIDINE GLUCONATE 0.12 % MT SOLN: 15.0000 mL | Freq: Once | OROMUCOSAL | Status: AC

## 2019-07-02 MED ORDER — ORAL CARE MOUTH RINSE: 15.0000 mL | Freq: Once | OROMUCOSAL | Status: AC

## 2019-07-02 MED ORDER — SODIUM CHLORIDE 0.9 % IV SOLN: INTRAVENOUS | Status: DC | PRN

## 2019-07-02 MED ORDER — MIDAZOLAM HCL 2 MG/2ML IJ SOLN
INTRAMUSCULAR | Status: AC
  Filled 2019-07-02: qty 2

## 2019-07-02 MED ORDER — PANTOPRAZOLE SODIUM 20 MG PO TBEC
20.0000 mg | DELAYED_RELEASE_TABLET | Freq: Two times a day (BID) | ORAL | Status: DC
  Administered 2019-07-03 – 2019-07-06 (×7): 20 mg via ORAL
  Filled 2019-07-02 (×8): qty 1

## 2019-07-02 SURGICAL SUPPLY — 28 items
ARMBAND PINK RESTRICT EXTREMIT (MISCELLANEOUS) ×2 IMPLANT
CANISTER SUCT 3000ML PPV (MISCELLANEOUS) ×2 IMPLANT
CLIP VESOCCLUDE MED 6/CT (CLIP) ×2 IMPLANT
CLIP VESOCCLUDE SM WIDE 6/CT (CLIP) ×2 IMPLANT
COVER PROBE W GEL 5X96 (DRAPES) IMPLANT
COVER WAND RF STERILE (DRAPES) ×2 IMPLANT
DERMABOND ADVANCED (GAUZE/BANDAGES/DRESSINGS) ×1
DERMABOND ADVANCED .7 DNX12 (GAUZE/BANDAGES/DRESSINGS) ×1 IMPLANT
ELECT REM PT RETURN 9FT ADLT (ELECTROSURGICAL) ×2
ELECTRODE REM PT RTRN 9FT ADLT (ELECTROSURGICAL) ×1 IMPLANT
GLOVE BIO SURGEON STRL SZ7.5 (GLOVE) ×2 IMPLANT
GOWN STRL REUS W/ TWL LRG LVL3 (GOWN DISPOSABLE) ×2 IMPLANT
GOWN STRL REUS W/ TWL XL LVL3 (GOWN DISPOSABLE) ×1 IMPLANT
GOWN STRL REUS W/TWL LRG LVL3 (GOWN DISPOSABLE) ×4
GOWN STRL REUS W/TWL XL LVL3 (GOWN DISPOSABLE) ×2
INSERT FOGARTY SM (MISCELLANEOUS) IMPLANT
KIT BASIN OR (CUSTOM PROCEDURE TRAY) ×2 IMPLANT
KIT TURNOVER KIT B (KITS) ×2 IMPLANT
NS IRRIG 1000ML POUR BTL (IV SOLUTION) ×2 IMPLANT
PACK CV ACCESS (CUSTOM PROCEDURE TRAY) ×2 IMPLANT
PAD ARMBOARD 7.5X6 YLW CONV (MISCELLANEOUS) ×4 IMPLANT
SUT MNCRL AB 4-0 PS2 18 (SUTURE) ×2 IMPLANT
SUT PROLENE 6 0 BV (SUTURE) ×2 IMPLANT
SUT VIC AB 3-0 SH 27 (SUTURE) ×2
SUT VIC AB 3-0 SH 27X BRD (SUTURE) ×1 IMPLANT
TOWEL GREEN STERILE (TOWEL DISPOSABLE) ×2 IMPLANT
UNDERPAD 30X36 HEAVY ABSORB (UNDERPADS AND DIAPERS) ×2 IMPLANT
WATER STERILE IRR 1000ML POUR (IV SOLUTION) ×2 IMPLANT

## 2019-07-02 NOTE — Progress Notes (Signed)
Dr. Lissa Hoard made aware of the elevated blood pressures.

## 2019-07-02 NOTE — Anesthesia Preprocedure Evaluation (Signed)
Anesthesia Evaluation    Airway        Dental   Pulmonary           Cardiovascular hypertension,      Neuro/Psych    GI/Hepatic   Endo/Other  diabetes  Renal/GU      Musculoskeletal   Abdominal   Peds  Hematology   Anesthesia Other Findings   Reproductive/Obstetrics                             Anesthesia Physical Anesthesia Plan  ASA:   Anesthesia Plan:    Post-op Pain Management:    Induction:   PONV Risk Score and Plan:   Airway Management Planned:   Additional Equipment:   Intra-op Plan:   Post-operative Plan:   Informed Consent:   Plan Discussed with:   Anesthesia Plan Comments: (BP 267/109 in preop. Sending patient to ED for hypertensive emergency.)        Anesthesia Quick Evaluation

## 2019-07-02 NOTE — ED Provider Notes (Signed)
[facility] EMERGENCY DEPARTMENT Provider Note   CSN: [id number] Arrival date & time: 07/02/19  1423     History Chief Complaint  Patient presents with  . Hypertension    [name] [name] is a 59 y.o. female.  The history is provided by the patient and medical records. No language interpreter was used.  Hypertension This is a chronic problem. The current episode started more than 2 days ago. The problem occurs constantly. The problem has been gradually worsening. Associated symptoms include chest pain, headaches and shortness of breath. Pertinent negatives include no abdominal pain. Nothing aggravates the symptoms. Nothing relieves the symptoms. She has tried nothing for the symptoms. The treatment provided no relief.       Past Medical History:  Diagnosis Date  . Anemia   . Anxiety   . Arthritis    "hips" (08/28/2013)  . Asthma   . Chronic kidney disease   . Chronic lower back pain    10-27-13 not a problem at this time  . Depression   . Esophageal spasm   . Frequent UTI   . GERD (gastroesophageal reflux disease)   . Hepatitis C    genotype 1B  . High cholesterol   . Hypertension   . Migraine    "monthly" (08/28/2013)  . Pneumonia    "several times"  . Type II diabetes mellitus ([place])   . Ulcer    hx gastric ulcers    Patient Active Problem List   Diagnosis Date Noted  . Anemia of chronic renal failure 02/17/2019  . Blood in urine 02/17/2019  . Chronic pain 02/17/2019  . Closed fracture of coccyx ([place]) 02/17/2019  . Closed fracture of phalanx of foot 02/17/2019  . Depressive disorder 02/17/2019  . History of hepatitis C 02/17/2019  . Hypertensive renal disease 02/17/2019  . Impingement syndrome of left shoulder region 02/17/2019  . Low back pain 02/17/2019  . Obesity 02/17/2019  . Shoulder pain 02/17/2019  . Sacroiliac joint pain 01/01/2019  . Lumbar radiculopathy 07/25/2018  . AKI (acute kidney injury) ([place]) 11/15/2016  . Iron deficiency  anemia due to chronic blood loss 11/15/2016  . Vitamin D deficiency 11/15/2016  . CKD (chronic kidney disease), stage III 10/17/2016  . Hyperkalemia 10/17/2016  . Microscopic hematuria 10/17/2016  . Proteinuria 10/17/2016  . Acute gastric ulcer without hemorrhage or perforation   . Corns and callosities 08/13/2016  . GI bleed due to NSAIDs 08/13/2016  . Abdominal pain 08/13/2016  . Need for influenza vaccination 12/26/2015  . Diabetic neuropathy associated with type 2 diabetes mellitus ([place]) 12/25/2015  . Leg cramps, sleep related 12/05/2015  . Hypercholesteremia 11/09/2015  . Globus sensation 06/14/2014  . Abnormal EKG 08/29/2013  . Hypertensive urgency 08/28/2013  . Chest pain 08/28/2013  . GERD (gastroesophageal reflux disease) 07/17/2013  . Diarrhea 07/17/2013  . Abdominal pain, unspecified site 07/17/2013  . DM (diabetes mellitus), type 2 with neurological complications ([place]) 50/04/7046  . Atypical chest pain 03/09/2009  . Dysphagia 03/09/2009  . NAUSEA AND VOMITING 12/20/2008  . HEPATITIS C, CHRONIC 03/10/2008  . Essential hypertension 03/10/2008  . BRONCHITIS, CHRONIC 03/10/2008  . Dyskinesia of esophagus 03/10/2008  . ARTHRITIS 03/10/2008    Past Surgical History:  Procedure Laterality Date  . ABDOMINAL HYSTERECTOMY    . APPENDECTOMY    . AV FISTULA PLACEMENT Left 05/19/2019   Procedure: LEFT ARM Brachiocephalic ARTERIOVENOUS (AV) FISTULA CREATION;  Surgeon: Waynetta Sandy, MD;  Location: [facility];  Service: Vascular;  Laterality: Left;  . BALLOON DILATION N/A 12/29/2012   Procedure: BALLOON DILATION;  Surgeon: Inda Castle, MD;  Location: [facility];  Service: Endoscopy;  Laterality: N/A;  . BALLOON DILATION N/A 06/25/2013   Procedure: BALLOON DILATION;  Surgeon: Inda Castle, MD;  Location: [facility];  Service: Endoscopy;  Laterality: N/A;  . BOTOX INJECTION  12/29/2012   Procedure: BOTOX INJECTION;  Surgeon: Inda Castle, MD;  Location: [facility]  ENDOSCOPY;  Service: Endoscopy;;  . BOTOX INJECTION N/A 06/25/2013   Procedure: BOTOX INJECTION;  Surgeon: Inda Castle, MD;  Location: [facility];  Service: Endoscopy;  Laterality: N/A;  . CARDIAC CATHETERIZATION  X 2   /notes 08/28/2013  . CARPAL TUNNEL RELEASE Right   . CHOLECYSTECTOMY    . COLONOSCOPY  04/10/2012   Normal   . ESOPHAGEAL MANOMETRY N/A 12/26/2015   Procedure: ESOPHAGEAL MANOMETRY (EM);  Surgeon: Doran Stabler, MD;  Location: [facility];  Service: Gastroenterology;  Laterality: N/A;  . ESOPHAGOGASTRODUODENOSCOPY N/A 12/29/2012   Procedure: ESOPHAGOGASTRODUODENOSCOPY (EGD);  Surgeon: Inda Castle, MD;  Location: [facility];  Service: Endoscopy;  Laterality: N/A;  . ESOPHAGOGASTRODUODENOSCOPY N/A 06/25/2013   Procedure: ESOPHAGOGASTRODUODENOSCOPY (EGD);  Surgeon: Inda Castle, MD;  Location: [facility];  Service: Endoscopy;  Laterality: N/A;  . ESOPHAGOGASTRODUODENOSCOPY N/A 11/03/2013   Procedure: ESOPHAGOGASTRODUODENOSCOPY (EGD);  Surgeon: Inda Castle, MD;  Location: [facility];  Service: Endoscopy;  Laterality: N/A;  Botox - ballon dilation  . ESOPHAGOGASTRODUODENOSCOPY N/A 08/14/2016   Procedure: ESOPHAGOGASTRODUODENOSCOPY (EGD);  Surgeon: Milus Banister, MD;  Location: [facility] END[facility]COPY;  Service: Endoscopy;  Laterality: N/A;  . FOOT FRACTURE SURGERY Right   . HEEL SPUR EXCISION Right   . TONSILLECTOMY    . TUBAL LIGATION       OB History   No obstetric history on file.     Family History  Problem Relation Age of Onset  . Hypertension Mother   . Alzheimer's disease Father   . Throat cancer Sister 40       died at age 70  . Colon cancer Neg Hx   . Rectal cancer Neg Hx   . Stomach cancer Neg Hx     Social History   Tobacco Use  . Smoking status: Never Smoker  . Smokeless tobacco: Never Used  Substance Use Topics  . Alcohol use: No  . Drug use: No    Home Medications Prior to Admission medications   Medication Sig Start Date End  Date Taking? Authorizing Provider  acetaminophen (TYLENOL) 500 MG tablet Take 1,000 mg by mouth every 4 (four) hours as needed for moderate pain.     [provider]  albuterol (PROAIR HFA) 108 (90 Base) MCG/ACT inhaler Inhale 2 puffs into the lungs every 4 (four) hours as needed for wheezing or shortness of breath.     [provider]  ammonium lactate (LAC-HYDRIN) 12 % lotion Apply 1 application topically 2 (two) times daily as needed (itchy dry skin).  11/27/18   [provider]  calcitRIOL (ROCALTROL) 0.25 MCG capsule Take 0.25 mcg by mouth 2 (two) times daily.     [provider]  Carboxymethylcellul-Glycerin (LUBRICATING EYE DROPS OP) Place 1 drop into both eyes daily as needed (dry eyes).    [provider]  cloNIDine (CATAPRES) 0.2 MG tablet Take 0.2 mg by mouth 2 (two) times daily.     [provider]  cyclobenzaprine (FLEXERIL) 10 MG tablet Take 10 mg by mouth daily as  needed for muscle spasms.    [provider]  gabapentin (NEURONTIN) 300 MG capsule TAKE 1 CAPSULE BY MOUTH THREE TIMES DAILY Patient taking differently: Take 300 mg by mouth 2 (two) times daily as needed (pain).  06/29/19   Hyatt, Max T, DPM  glimepiride (AMARYL) 4 MG tablet Take 4 mg by mouth in the morning and at bedtime.     [provider]  HUMULIN N 100 UNIT/ML injection Inject 20 Units into the skin 2 (two) times daily.  06/26/18   [provider]  labetalol (NORMODYNE) 200 MG tablet Take 200 mg by mouth See admin instructions. Takes with 300 mg to equal 500 mg twice daily    [provider]  labetalol (NORMODYNE) 300 MG tablet Take 300 mg by mouth See admin instructions. Takes with 200 mg to equal 500 mg twice daily 06/21/18   [provider]  lidocaine (LIDODERM) 5 % Place 1 patch onto the skin daily as needed for pain. 12/29/18   [provider]  nitroGLYCERIN (NITROSTAT) 0.4 MG SL tablet Place 0.4 mg under the  tongue every 5 (five) minutes as needed for chest pain.    [provider]  omeprazole (PRILOSEC) 20 MG capsule Take 1 capsule (20 mg total) by mouth 2 (two) times daily before a meal. Patient taking differently: Take 20 mg by mouth daily as needed (ulcers).  10/11/16   Doran Stabler, MD  oxymetazoline (AFRIN) 0.05 % nasal spray Place 1 spray into both nostrils 2 (two) times daily as needed for congestion.    [provider]  sodium bicarbonate 650 MG tablet Take 1,300 mg by mouth 2 (two) times daily.  07/11/18   [provider]  sodium zirconium cyclosilicate (LOKELMA) 10 g PACK packet Take 10 g by mouth daily.     [provider]  traMADol (ULTRAM) 50 MG tablet Take 1 tablet (50 mg total) by mouth every 6 (six) hours as needed for moderate pain. 05/19/19   Dagoberto Ligas, PA-C    Allergies    Hydrochlorothiazide, Amlodipine, Duloxetine, Iodinated diagnostic agents, Lisinopril, Metformin, Codeine, and Dilaudid [hydromorphone hcl]  Review of Systems   Review of Systems  Constitutional: Positive for fatigue. Negative for chills, diaphoresis and fever.  HENT: Negative for congestion.   Eyes: Negative for photophobia and visual disturbance.  Respiratory: Positive for chest tightness and shortness of breath. Negative for cough and wheezing.   Cardiovascular: Positive for chest pain. Negative for leg swelling.  Gastrointestinal: Negative for abdominal pain, constipation, diarrhea, nausea and vomiting.  Genitourinary: Positive for decreased urine volume. Negative for dysuria and flank pain.  Musculoskeletal: Negative for back pain, neck pain and neck stiffness.  Neurological: Positive for light-headedness and headaches. Negative for dizziness, seizures, speech difficulty, weakness and numbness.  Psychiatric/Behavioral: Negative for agitation and confusion.  All other systems reviewed and are negative.   Physical Exam Updated Vital Signs BP (!) 207/74 (BP  Location: Right Arm)   Pulse 71   Temp 98 F (36.7 C) (Oral)   Resp 18   SpO2 98%   Physical Exam Vitals and nursing note reviewed.  Constitutional:      General: She is not in acute distress.    Appearance: She is well-developed. She is not ill-appearing, toxic-appearing or diaphoretic.  HENT:     Head: Normocephalic and atraumatic.     Nose: No congestion or rhinorrhea.     Mouth/Throat:     Mouth: Mucous membranes are moist.  Pharynx: No oropharyngeal exudate or posterior oropharyngeal erythema.  Eyes:     Extraocular Movements: Extraocular movements intact.     Conjunctiva/sclera: Conjunctivae normal.     Pupils: Pupils are equal, round, and reactive to light.  Cardiovascular:     Rate and Rhythm: Normal rate and regular rhythm.     Pulses: Normal pulses.     Heart sounds: No murmur.  Pulmonary:     Effort: Pulmonary effort is normal. No respiratory distress.     Breath sounds: Normal breath sounds. No wheezing, rhonchi or rales.  Chest:     Chest wall: No tenderness.  Abdominal:     General: Abdomen is flat.     Palpations: Abdomen is soft.     Tenderness: There is no abdominal tenderness. There is no right CVA tenderness, left CVA tenderness, guarding or rebound.  Musculoskeletal:        General: No tenderness.     Cervical back: Neck supple. No tenderness.  Skin:    General: Skin is warm and dry.     Capillary Refill: Capillary refill takes less than 2 seconds.     Findings: No rash.  Neurological:     General: No focal deficit present.     Mental Status: She is alert.  Psychiatric:        Mood and Affect: Mood normal.     ED Results / Procedures / Treatments   Labs (all labs ordered are listed, but only abnormal results are displayed) Labs Reviewed  BASIC METABOLIC PANEL - Abnormal; Notable for the following components:      Result Value   Potassium 5.2 (*)    Glucose, Bld 172 (*)    BUN 38 (*)    Creatinine, Ser 3.93 (*)    GFR calc non Af Amer  12 (*)    GFR calc Af Amer 14 (*)    All other components within normal limits  CBC - Abnormal; Notable for the following components:   WBC 12.3 (*)    RBC 3.83 (*)    Hemoglobin 10.3 (*)    HCT 31.5 (*)    RDW 15.9 (*)    All other components within normal limits  HEPATIC FUNCTION PANEL - Abnormal; Notable for the following components:   Albumin 3.1 (*)    Alkaline Phosphatase 144 (*)    All other components within normal limits  URINE CULTURE  SARS CORONAVIRUS 2 BY RT PCR (HOSPITAL ORDER, [facility])  LIPASE, BLOOD  URINALYSIS, ROUTINE W REFLEX MICROSCOPIC  HIV ANTIBODY (ROUTINE TESTING W REFLEX)  HEMOGLOBIN P3I  BASIC METABOLIC PANEL  TROPONIN I (HIGH SENSITIVITY)  TROPONIN I (HIGH SENSITIVITY)    EKG EKG Interpretation  Date/Time:  Thursday Jul 02 2019 21:13:57 EDT Ventricular Rate:  72 PR Interval:    QRS Duration: 78 QT Interval:  399 QTC Calculation: 437 R Axis:   68 Text Interpretation: Sinus rhythm Borderline repolarization abnormality When comapred to prior, new t wave inversions in leads 2,3,AVF, and V6. no STEMI Confirmed by Antony Blackbird [phone]) on 07/02/2019 9:45:34 PM   Radiology DG Chest 2 View  Result Date: 07/02/2019 CLINICAL DATA:  Headache and chest pain. EXAM: CHEST - 2 VIEW COMPARISON:  October 31, 2018 FINDINGS: There is no evidence of acute infiltrate, pleural effusion or pneumothorax. The heart size and mediastinal contours are within normal limits. The visualized skeletal structures are unremarkable. IMPRESSION: No active cardiopulmonary disease. Electronically Signed   By: Virgina Norfolk  M.D.   On: 07/02/2019 15:24   CT Head Wo Contrast  Result Date: 07/02/2019 CLINICAL DATA:  Mental status change. Hypertensive emergency. Headache. EXAM: CT HEAD WITHOUT CONTRAST TECHNIQUE: Contiguous axial images were obtained from the base of the skull through the vertex without intravenous contrast. COMPARISON:  Head CT  08/18/2013 FINDINGS: Brain: No intracranial hemorrhage, mass effect, or midline shift. No hydrocephalus. The basilar cisterns are patent. No evidence of territorial infarct or acute ischemia. No extra-axial or intracranial fluid collection. Vascular: No hyperdense vessel or unexpected calcification. Skull: No fracture or focal lesion. Sinuses/Orbits: Paranasal sinuses and mastoid air cells are clear. The visualized orbits are unremarkable. Other: None. IMPRESSION: Negative noncontrast head CT. Electronically Signed   By: Keith Rake M.D.   On: 07/02/2019 21:34    Procedures Procedures (including critical care time)  Medications Ordered in ED Medications  sodium chloride flush (NS) 0.9 % injection 3 mL (has no administration in time range)  traMADol (ULTRAM) tablet 50 mg (has no administration in time range)  cloNIDine (CATAPRES) tablet 0.2 mg (has no administration in time range)  calcitRIOL (ROCALTROL) capsule 0.25 mcg (has no administration in time range)  insulin NPH Human (NOVOLIN N) injection 20 Units (has no administration in time range)  pantoprazole (PROTONIX) EC tablet 20 mg (has no administration in time range)  sodium bicarbonate tablet 1,300 mg (has no administration in time range)  sodium zirconium cyclosilicate (LOKELMA) packet 10 g (has no administration in time range)  heparin injection 5,000 Units (has no administration in time range)  acetaminophen (TYLENOL) tablet 650 mg (has no administration in time range)    Or  acetaminophen (TYLENOL) suppository 650 mg (has no administration in time range)  insulin aspart (novoLOG) injection 0-9 Units (has no administration in time range)  insulin aspart (novoLOG) injection 0-5 Units (has no administration in time range)  hydrALAZINE (APRESOLINE) injection 10 mg (has no administration in time range)  labetalol (NORMODYNE) injection 10 mg (has no administration in time range)  labetalol (NORMODYNE) injection 10 mg (10 mg  Intravenous Given 07/02/19 2138)    ED Course  I have reviewed the triage vital signs and the nursing notes.  Pertinent labs & imaging results that were available during my care of the patient were reviewed by me and considered in my medical decision making (see chart for details).    MDM Rules/Calculators/A&P                      MCKENSI REDINGER is a 59 y.o. female with a past medical history significant for diabetes, hypertension, CKD getting ready for likely dialysis with fistula placement scheduled for today, prior GI bleed, and asthma who presents from short stay preoperative area for hypertensive emergency.  According to patient, she was scheduled to have a surgery with vascular today for left arm fistula placement for her likely need to start dialysis and was found to blood pressure of 034 systolic.  Patient reports last few days her blood pressures been over 200 despite taking her home medications and she has been having some headaches, chest discomfort, shortness of breath, palpitations, and malaise.  She reports that the symptoms have been persistent after coming to the emergency department.  Patient awaiting emergency department for several hours before I was able to assess the patient at approximately 5 hours into her stay.  She denies any fevers, chills, cough, nausea, vomiting, urinary symptoms or GI symptoms.  She does report her urine amount is  slightly decreased from prior.  On exam, chest and abdomen nontender.  Pulses present in all extremities.  Lungs were clear.  Slight murmur.  No focal neurologic deficits.  EKG showed some new T wave inversions in lead II, III, aVF, and V6.  No STEMI.  Patient had some screening blood work done however due to the headache, will get head CT, will also get other labs and chest x-ray.  Initial troponin is negative, CT head shows no intracranial hemorrhage.  BMP shows creatinine actually improving from prior.  Potassium slightly elevated.  Mild  leukocytosis and mild anemia.  Covid test in process.  Chest x-ray shows no pneumonia or other normality and urine is not been collected yet.  Patient still having elevated blood pressures.  Will be given more labetalol due to continued hypertension.  Given her EKG changes, chest discomfort, headache, and taking all her home blood pressure medicine, do not feel safe for discharge home.  Will call for admission for hypertensive emergency and for blood pressure management.    Final Clinical Impression(s) / ED Diagnoses Final diagnoses:  Hypertensive emergency    Rx / DC Orders ED Discharge Orders    None      Clinical Impression: 1. Hypertensive emergency     Disposition: Admit  This note was prepared with assistance of Dragon voice recognition software. Occasional wrong-word or sound-a-like substitutions may have occurred due to the inherent limitations of voice recognition software.     Kareen Hitsman, Gwenyth Allegra, MD 07/02/19 2329

## 2019-07-02 NOTE — ED Triage Notes (Signed)
Pt here from short stay, was supposed to have dialysis fistula placed today but was hypertensive to 336 systolic on their assessment so was sent to ED. Endorses slight dizziness. Compliant with blood pressure medication.

## 2019-07-02 NOTE — Progress Notes (Signed)
Report given to the ed charge nurse regarding the pt's surgery being cancelled due to her having elevated bp's despite her taking her medication prior to arrival.

## 2019-07-02 NOTE — ED Notes (Addendum)
Pt stated she wanted to leave. RN came to talk with pt and she stated she still wanted to leave. This NT took out pts IV.

## 2019-07-02 NOTE — H&P (Signed)
History and Physical    [name] [name] JEH:[id number] DOB: [birth year] DOA: 07/02/2019  PCP: [name], MD Patient coming from: Short stay  Chief Complaint: High blood pressure  HPI: [name] [name] is a 59 y.o. female with medical history significant of CKD stage V, GERD, hypertension, hyperlipidemia, insulin-dependent type 2 diabetes presenting with complaints of high blood pressure readings. Patient reports 1 month history of significantly elevated blood pressure readings with systolic above 378.  States her doctor increased the dose of her home labetalol but her blood pressure continued to be high.  She also takes clonidine and reports compliance.  States she had a left arm dialysis fistula placed a month ago and was told it was malfunctioning.  She went to for short stay today to get another fistula placed but it was noted that her blood pressure was too high so they advised her to come into the ED to be evaluated.  Reports having mild headaches.  Also reports intermittent episodes of pressure-like substernal chest pain radiating to her left arm for the past few days.  She has also been feeling short of breath with exertion.  Denies cough.  She has been vaccinated for Covid.  Denies abdominal pain.  Denies any difficulty making urine.  ED Course: Blood pressure significantly elevated with systolic up to 588F.  WBC count mildly elevated at 12.3, consistent with prior labs.  Hemoglobin 10.3, no significant change from baseline.  Potassium borderline elevated at 5.2.  Bicarb 22.  Creatinine 3.9, improved compared to labs done a month ago.  EKG showing new T wave inversions in inferior leads.  High-sensitivity troponin negative x2.  Screening SARS-CoV-2 PCR test pending.  Chest x-ray showing no active cardiopulmonary disease.  Head CT negative for acute intracranial abnormality.  Patient was given IV labetalol 10 mg and blood pressure improved.  Review of Systems:  All systems reviewed and apart from  history of presenting illness, are negative.  Past Medical History:  Diagnosis Date  . Anemia   . Anxiety   . Arthritis    "hips" (08/28/2013)  . Asthma   . Chronic kidney disease   . Chronic lower back pain    10-27-13 not a problem at this time  . Depression   . Esophageal spasm   . Frequent UTI   . GERD (gastroesophageal reflux disease)   . Hepatitis C    genotype 1B  . High cholesterol   . Hypertension   . Migraine    "monthly" (08/28/2013)  . Pneumonia    "several times"  . Type II diabetes mellitus ([place])   . Ulcer    hx gastric ulcers    Past Surgical History:  Procedure Laterality Date  . ABDOMINAL HYSTERECTOMY    . APPENDECTOMY    . AV FISTULA PLACEMENT Left 05/19/2019   Procedure: LEFT ARM Brachiocephalic ARTERIOVENOUS (AV) FISTULA CREATION;  Surgeon: Waynetta Sandy, MD;  Location: [facility];  Service: Vascular;  Laterality: Left;  . BALLOON DILATION N/A 12/29/2012   Procedure: BALLOON DILATION;  Surgeon: Inda Castle, MD;  Location: [facility];  Service: Endoscopy;  Laterality: N/A;  . BALLOON DILATION N/A 06/25/2013   Procedure: BALLOON DILATION;  Surgeon: Inda Castle, MD;  Location: [facility];  Service: Endoscopy;  Laterality: N/A;  . BOTOX INJECTION  12/29/2012   Procedure: BOTOX INJECTION;  Surgeon: Inda Castle, MD;  Location: [facility];  Service: Endoscopy;;  . BOTOX INJECTION N/A 06/25/2013   Procedure: BOTOX INJECTION;  Surgeon:  Inda Castle, MD;  Location: [facility];  Service: Endoscopy;  Laterality: N/A;  . CARDIAC CATHETERIZATION  X 2   /notes 08/28/2013  . CARPAL TUNNEL RELEASE Right   . CHOLECYSTECTOMY    . COLONOSCOPY  04/10/2012   Normal   . ESOPHAGEAL MANOMETRY N/A 12/26/2015   Procedure: ESOPHAGEAL MANOMETRY (EM);  Surgeon: Doran Stabler, MD;  Location: [facility];  Service: Gastroenterology;  Laterality: N/A;  . ESOPHAGOGASTRODUODENOSCOPY N/A 12/29/2012   Procedure: ESOPHAGOGASTRODUODENOSCOPY (EGD);  Surgeon:  Inda Castle, MD;  Location: [facility];  Service: Endoscopy;  Laterality: N/A;  . ESOPHAGOGASTRODUODENOSCOPY N/A 06/25/2013   Procedure: ESOPHAGOGASTRODUODENOSCOPY (EGD);  Surgeon: Inda Castle, MD;  Location: [facility];  Service: Endoscopy;  Laterality: N/A;  . ESOPHAGOGASTRODUODENOSCOPY N/A 11/03/2013   Procedure: ESOPHAGOGASTRODUODENOSCOPY (EGD);  Surgeon: Inda Castle, MD;  Location: [facility];  Service: Endoscopy;  Laterality: N/A;  Botox - ballon dilation  . ESOPHAGOGASTRODUODENOSCOPY N/A 08/14/2016   Procedure: ESOPHAGOGASTRODUODENOSCOPY (EGD);  Surgeon: Milus Banister, MD;  Location: [facility];  Service: Endoscopy;  Laterality: N/A;  . FOOT FRACTURE SURGERY Right   . HEEL SPUR EXCISION Right   . TONSILLECTOMY    . TUBAL LIGATION       reports that she has never smoked. She has never used smokeless tobacco. She reports that she does not drink alcohol or use drugs.  Allergies  Allergen Reactions  . Hydrochlorothiazide Itching  . Amlodipine     Foot swelling/chest pain   . Duloxetine     Hallucinations   . Iodinated Diagnostic Agents     Pt is unaware of this allergy  . Lisinopril Cough  . Metformin     Unknown reaction  . Codeine Itching    No hives or rash, takes benadryl  . Dilaudid [Hydromorphone Hcl] Itching    "mild itching hrs after dilaudid given"    Family History  Problem Relation Age of Onset  . Hypertension Mother   . Alzheimer's disease Father   . Throat cancer Sister 21       died at age 65  . Colon cancer Neg Hx   . Rectal cancer Neg Hx   . Stomach cancer Neg Hx     Prior to Admission medications   Medication Sig Start Date End Date Taking? Authorizing Provider  acetaminophen (TYLENOL) 500 MG tablet Take 1,000 mg by mouth every 4 (four) hours as needed for moderate pain.    Yes [provider]  albuterol (PROAIR HFA) 108 (90 Base) MCG/ACT inhaler Inhale 2 puffs into the lungs every 4 (four) hours as needed for wheezing or  shortness of breath.    Yes [provider]  ammonium lactate (LAC-HYDRIN) 12 % lotion Apply 1 application topically 2 (two) times daily as needed (itchy dry skin).  11/27/18  Yes [provider]  calcitRIOL (ROCALTROL) 0.25 MCG capsule Take 0.25 mcg by mouth 2 (two) times daily.    Yes [provider]  Carboxymethylcellul-Glycerin (LUBRICATING EYE DROPS OP) Place 1 drop into both eyes daily as needed (dry eyes).   Yes [provider]  cloNIDine (CATAPRES) 0.2 MG tablet Take 0.2 mg by mouth 2 (two) times daily.    Yes [provider]  cyclobenzaprine (FLEXERIL) 10 MG tablet Take 10 mg by mouth daily as needed for muscle spasms.   Yes [provider]  gabapentin (NEURONTIN) 300 MG capsule TAKE 1 CAPSULE BY MOUTH THREE TIMES DAILY Patient taking differently: Take 300 mg by mouth  2 (two) times daily.  06/29/19  Yes Hyatt, Max T, DPM  glimepiride (AMARYL) 4 MG tablet Take 4 mg by mouth in the morning and at bedtime.    Yes [provider]  HUMULIN N 100 UNIT/ML injection Inject 20 Units into the skin 2 (two) times daily.  06/26/18  Yes [provider]  labetalol (NORMODYNE) 200 MG tablet Take 200 mg by mouth See admin instructions. Takes with 300 mg to equal 500 mg twice daily.   Yes [provider]  labetalol (NORMODYNE) 300 MG tablet Take 300 mg by mouth See admin instructions. Takes with 200 mg to equal 500 mg twice daily 06/21/18  Yes [provider]  lidocaine (LIDODERM) 5 % Place 1 patch onto the skin daily as needed for pain. 12/29/18  Yes [provider]  nitroGLYCERIN (NITROSTAT) 0.4 MG SL tablet Place 0.4 mg under the tongue every 5 (five) minutes as needed for chest pain.   Yes [provider]  omeprazole (PRILOSEC) 20 MG capsule Take 1 capsule (20 mg total) by mouth 2 (two) times daily before a meal. Patient taking differently: Take 20 mg by mouth daily as needed (ulcers).  10/11/16  Yes  Danis, Kirke Corin, MD  oxymetazoline (AFRIN) 0.05 % nasal spray Place 1 spray into both nostrils 2 (two) times daily as needed for congestion.   Yes [provider]  sodium bicarbonate 650 MG tablet Take 1,300 mg by mouth 2 (two) times daily.  07/11/18  Yes [provider]  sodium zirconium cyclosilicate (LOKELMA) 10 g PACK packet Take 10 g by mouth daily.    Yes [provider]  traMADol (ULTRAM) 50 MG tablet Take 1 tablet (50 mg total) by mouth every 6 (six) hours as needed for moderate pain. 05/19/19  Yes Dagoberto Ligas, PA-C    Physical Exam: Vitals:   07/02/19 2200 07/02/19 2230 07/02/19 2300 07/02/19 2317  BP: (!) 184/73 (!) 178/57 (!) 173/62 (!) 173/62  Pulse:   73 71  Resp: 18 17 18 18   Temp:    98.2 F (36.8 C)  TempSrc:    Oral  SpO2:   98% 99%    Physical Exam  Constitutional: She is oriented to person, place, and time. She appears well-developed and well-nourished. No distress.  HENT:  Head: Normocephalic.  Eyes: Pupils are equal, round, and reactive to light. Right eye exhibits no discharge. Left eye exhibits no discharge.  Cardiovascular: Normal rate, regular rhythm and intact distal pulses.  Pulmonary/Chest: Effort normal and breath sounds normal. No respiratory distress. She has no wheezes. She has no rales.  Abdominal: Soft. Bowel sounds are normal. She exhibits no distension. There is no abdominal tenderness. There is no guarding.  Musculoskeletal:        General: No edema.     Cervical back: Neck supple.  Neurological: She is alert and oriented to person, place, and time.  Skin: Skin is warm and dry. She is not diaphoretic.    Labs on Admission: I have personally reviewed following labs and imaging studies  CBC: Recent Labs  Lab 07/02/19 1301 07/02/19 1459  WBC  --  12.3*  HGB 11.6* 10.3*  HCT 34.0* 31.5*  MCV  --  82.2  PLT  --  672   Basic Metabolic Panel: Recent Labs  Lab 07/02/19 1301 07/02/19 1459  NA 139 139  K  5.4* 5.2*  CL 108 107  CO2  --  22  GLUCOSE 207* 172*  BUN 37*  38*  CREATININE 4.30* 3.93*  CALCIUM  --  9.0   GFR: Estimated Creatinine Clearance: 20.1 mL/min (A) (by C-G formula based on SCr of 3.93 mg/dL (H)). Liver Function Tests: Recent Labs  Lab 07/02/19 2059  AST 17  ALT 15  ALKPHOS 144*  BILITOT 0.5  PROT 6.6  ALBUMIN 3.1*   Recent Labs  Lab 07/02/19 2059  LIPASE 38   No results for input(s): AMMONIA in the last 168 hours. Coagulation Profile: No results for input(s): INR, PROTIME in the last 168 hours. Cardiac Enzymes: No results for input(s): CKTOTAL, CKMB, CKMBINDEX, TROPONINI in the last 168 hours. BNP (last 3 results) No results for input(s): PROBNP in the last 8760 hours. HbA1C: No results for input(s): HGBA1C in the last 72 hours. CBG: Recent Labs  Lab 07/02/19 1202  GLUCAP 232*   Lipid Profile: No results for input(s): CHOL, HDL, LDLCALC, TRIG, CHOLHDL, LDLDIRECT in the last 72 hours. Thyroid Function Tests: No results for input(s): TSH, T4TOTAL, FREET4, T3FREE, THYROIDAB in the last 72 hours. Anemia Panel: No results for input(s): VITAMINB12, FOLATE, FERRITIN, TIBC, IRON, RETICCTPCT in the last 72 hours. Urine analysis:    Component Value Date/Time   COLORURINE YELLOW 08/13/2016 1043   APPEARANCEUR CLEAR 08/13/2016 1043   LABSPEC 1.020 08/13/2016 1043   PHURINE 5.5 08/13/2016 1043   GLUCOSEU NEGATIVE 08/13/2016 1043   HGBUR TRACE (A) 08/13/2016 1043   BILIRUBINUR NEGATIVE 08/13/2016 1043   KETONESUR NEGATIVE 08/13/2016 1043   PROTEINUR 100 (A) 08/13/2016 1043   UROBILINOGEN 0.2 10/22/2013 1757   NITRITE NEGATIVE 08/13/2016 1043   LEUKOCYTESUR TRACE (A) 08/13/2016 1043    Radiological Exams on Admission: DG Chest 2 View  Result Date: 07/02/2019 CLINICAL DATA:  Headache and chest pain. EXAM: CHEST - 2 VIEW COMPARISON:  October 31, 2018 FINDINGS: There is no evidence of acute infiltrate, pleural effusion or pneumothorax. The heart  size and mediastinal contours are within normal limits. The visualized skeletal structures are unremarkable. IMPRESSION: No active cardiopulmonary disease. Electronically Signed   By: Virgina Norfolk M.D.   On: 07/02/2019 15:24   CT Head Wo Contrast  Result Date: 07/02/2019 CLINICAL DATA:  Mental status change. Hypertensive emergency. Headache. EXAM: CT HEAD WITHOUT CONTRAST TECHNIQUE: Contiguous axial images were obtained from the base of the skull through the vertex without intravenous contrast. COMPARISON:  Head CT 08/18/2013 FINDINGS: Brain: No intracranial hemorrhage, mass effect, or midline shift. No hydrocephalus. The basilar cisterns are patent. No evidence of territorial infarct or acute ischemia. No extra-axial or intracranial fluid collection. Vascular: No hyperdense vessel or unexpected calcification. Skull: No fracture or focal lesion. Sinuses/Orbits: Paranasal sinuses and mastoid air cells are clear. The visualized orbits are unremarkable. Other: None. IMPRESSION: Negative noncontrast head CT. Electronically Signed   By: Keith Rake M.D.   On: 07/02/2019 21:34    EKG: Independently reviewed.  Sinus rhythm.  T wave inversions in leads II, III, and aVF appear new compared to prior tracing from September 2020.  T wave inversion in V6 appear similar to prior tracing.  Assessment/Plan Principal Problem:   Hypertensive urgency Active Problems:   GERD (gastroesophageal reflux disease)   Chest pain   Type 2 diabetes mellitus (HCC)   CKD (chronic kidney disease), stage V (HCC)   Hypertensive urgency: Patient reports 1 month history of significantly elevated blood pressure with systolic above 509 despite increasing the dose of her home labetalol.  Also reports compliance with clonidine.  Blood pressure significantly elevated in the ED  with systolic up to 532D, now improved with IV labetalol 10 mg.  Most recent systolic in the 924Q to 683M. -Resume home clonidine.  Order IV hydralazine  PRN and IV labetalol PRN SBP >160.  Admit to progressive care unit and monitor blood pressure closely.  Chest pain: Suspect related to hypertensive urgency.  EKG showing new T wave inversions in inferior leads.  However, high-sensitivity troponin checked twice reassuring.  Currently appears comfortable on exam. -Continue cardiac monitoring.  Management of hypertensive emergency as mentioned above.  CKD stage V: Patient has a failed left arm basilic vein fistula.  Plan was for her to get left arm basilic vein fistula creation versus graft today but was sent to the ED from short stay due to hypertensive crisis.  Creatinine 3.9, improved compared to labs done a month ago.  Potassium borderline elevated at 5.2 and consistent with prior labs.  Bicarb 22.  Patient does not appear volume overloaded on exam.  She is not endorsing oliguria or anuria. No indication for urgent dialysis at this time. -Continue home Lokelma, calcitriol, and bicarb supplementation.  Please ensure follow-up with vascular surgery and nephrology.  Insulin-dependent type 2 diabetes: Check A1c.  Continue home Novolin and 20 units twice daily.  Sliding scale insulin sensitive ACHS and CBG checks.  GERD: Continue PPI  DVT prophylaxis: Subcutaneous heparin Code Status: Full code Family Communication: Husband at bedside. Disposition Plan: Status is: Observation  The patient remains OBS appropriate and will d/c before 2 midnights.  Dispo: The patient is from: Home              Anticipated d/c is to: Home              Anticipated d/c date is: 1 day              Patient currently is not medically stable to d/c.  The medical decision making on this patient was of high complexity and the patient is at high risk for clinical deterioration, therefore this is a level 3 visit.  Shela Leff MD [facility]  If 7PM-7AM, please contact night-coverage [email or web address]  07/02/2019, 11:19 PM

## 2019-07-02 NOTE — H&P (Addendum)
   History and Physical Update  The patient was interviewed and re-examined.  The patient's previous History and Physical has been reviewed and is unchanged from recent office visit.  Plan for left arm AV fistula versus graft.  Racine Erby C. [name], MD [facility] Office: [phone] Pager: [phone]   07/02/2019, 12:09 PM    Addendum:  After discussion with Dr. Lissa Hoard patient's elevated blood pressure of systolic greater than 737 poses excess risk and she will be sent to the emergency department for evaluation of hypertensive urgency.  Servando Snare, MD

## 2019-07-02 NOTE — ED Notes (Signed)
Pt stated her daughter wanted her to wait one more hour before leaving.

## 2019-07-03 ENCOUNTER — Ambulatory Visit (HOSPITAL_COMMUNITY): Payer: Medicaid Other

## 2019-07-03 ENCOUNTER — Encounter (HOSPITAL_COMMUNITY): Payer: Self-pay | Admitting: Internal Medicine

## 2019-07-03 DIAGNOSIS — B1920 Unspecified viral hepatitis C without hepatic coma: Secondary | ICD-10-CM | POA: Diagnosis present

## 2019-07-03 DIAGNOSIS — M160 Bilateral primary osteoarthritis of hip: Secondary | ICD-10-CM | POA: Diagnosis present

## 2019-07-03 DIAGNOSIS — K219 Gastro-esophageal reflux disease without esophagitis: Secondary | ICD-10-CM | POA: Diagnosis present

## 2019-07-03 DIAGNOSIS — R519 Headache, unspecified: Secondary | ICD-10-CM | POA: Diagnosis not present

## 2019-07-03 DIAGNOSIS — I120 Hypertensive chronic kidney disease with stage 5 chronic kidney disease or end stage renal disease: Secondary | ICD-10-CM | POA: Diagnosis present

## 2019-07-03 DIAGNOSIS — I160 Hypertensive urgency: Secondary | ICD-10-CM | POA: Diagnosis not present

## 2019-07-03 DIAGNOSIS — Z20822 Contact with and (suspected) exposure to covid-19: Secondary | ICD-10-CM | POA: Diagnosis present

## 2019-07-03 DIAGNOSIS — Z885 Allergy status to narcotic agent status: Secondary | ICD-10-CM | POA: Diagnosis not present

## 2019-07-03 DIAGNOSIS — I10 Essential (primary) hypertension: Secondary | ICD-10-CM | POA: Diagnosis not present

## 2019-07-03 DIAGNOSIS — Z91041 Radiographic dye allergy status: Secondary | ICD-10-CM | POA: Diagnosis not present

## 2019-07-03 DIAGNOSIS — E785 Hyperlipidemia, unspecified: Secondary | ICD-10-CM | POA: Diagnosis present

## 2019-07-03 DIAGNOSIS — N185 Chronic kidney disease, stage 5: Secondary | ICD-10-CM

## 2019-07-03 DIAGNOSIS — R0989 Other specified symptoms and signs involving the circulatory and respiratory systems: Secondary | ICD-10-CM | POA: Diagnosis present

## 2019-07-03 DIAGNOSIS — Z794 Long term (current) use of insulin: Secondary | ICD-10-CM | POA: Diagnosis not present

## 2019-07-03 DIAGNOSIS — F329 Major depressive disorder, single episode, unspecified: Secondary | ICD-10-CM | POA: Diagnosis present

## 2019-07-03 DIAGNOSIS — E1122 Type 2 diabetes mellitus with diabetic chronic kidney disease: Secondary | ICD-10-CM | POA: Diagnosis present

## 2019-07-03 DIAGNOSIS — J45909 Unspecified asthma, uncomplicated: Secondary | ICD-10-CM | POA: Diagnosis present

## 2019-07-03 DIAGNOSIS — Z888 Allergy status to other drugs, medicaments and biological substances status: Secondary | ICD-10-CM | POA: Diagnosis not present

## 2019-07-03 DIAGNOSIS — Z79899 Other long term (current) drug therapy: Secondary | ICD-10-CM | POA: Diagnosis not present

## 2019-07-03 DIAGNOSIS — I161 Hypertensive emergency: Secondary | ICD-10-CM

## 2019-07-03 DIAGNOSIS — Z8711 Personal history of peptic ulcer disease: Secondary | ICD-10-CM | POA: Diagnosis not present

## 2019-07-03 HISTORY — DX: Hypertensive emergency: I16.1

## 2019-07-03 LAB — GLUCOSE, CAPILLARY
Glucose-Capillary: 246 mg/dL — ABNORMAL HIGH (ref 70–99)
Glucose-Capillary: 206 mg/dL — ABNORMAL HIGH (ref 70–99)
Glucose-Capillary: 160 mg/dL — ABNORMAL HIGH (ref 70–99)
Glucose-Capillary: 189 mg/dL — ABNORMAL HIGH (ref 70–99)

## 2019-07-03 LAB — URINALYSIS, ROUTINE W REFLEX MICROSCOPIC
Bacteria, UA: NONE SEEN
Bilirubin Urine: NEGATIVE
Glucose, UA: 150 mg/dL — AB
Hgb urine dipstick: NEGATIVE
Ketones, ur: NEGATIVE mg/dL
Nitrite: NEGATIVE
Protein, ur: >=300 mg/dL — AB
Specific Gravity, Urine: 1.014 (ref 1.005–1.030)
pH: 6 (ref 5.0–8.0)

## 2019-07-03 LAB — BASIC METABOLIC PANEL
Anion gap: 8 (ref 5–15)
BUN: 38 mg/dL — ABNORMAL HIGH (ref 6–20)
CO2: 24 mmol/L (ref 22–32)
Calcium: 8.4 mg/dL — ABNORMAL LOW (ref 8.9–10.3)
Chloride: 106 mmol/L (ref 98–111)
Creatinine, Ser: 3.98 mg/dL — ABNORMAL HIGH (ref 0.44–1.00)
GFR calc Af Amer: 14 mL/min — ABNORMAL LOW (ref >60)
GFR calc non Af Amer: 12 mL/min — ABNORMAL LOW (ref >60)
Glucose, Bld: 332 mg/dL — ABNORMAL HIGH (ref 70–99)
Potassium: 5.2 mmol/L — ABNORMAL HIGH (ref 3.5–5.1)
Sodium: 138 mmol/L (ref 135–145)

## 2019-07-03 LAB — HIV ANTIBODY (ROUTINE TESTING W REFLEX): HIV Screen 4th Generation wRfx: NONREACTIVE

## 2019-07-03 LAB — URINE CULTURE: Culture: NO GROWTH

## 2019-07-03 LAB — HEMOGLOBIN A1C
Hgb A1c MFr Bld: 6.9 % — ABNORMAL HIGH (ref 4.8–5.6)
Mean Plasma Glucose: 151.33 mg/dL

## 2019-07-03 LAB — SARS CORONAVIRUS 2 BY RT PCR (HOSPITAL ORDER, PERFORMED IN ~~LOC~~ HOSPITAL LAB): SARS Coronavirus 2: NEGATIVE

## 2019-07-03 MED ORDER — CLONIDINE HCL 0.2 MG PO TABS
0.2000 mg | ORAL_TABLET | Freq: Three times a day (TID) | ORAL | Status: DC
  Administered 2019-07-03 – 2019-07-06 (×11): 0.2 mg via ORAL
  Filled 2019-07-03 (×11): qty 1

## 2019-07-03 MED ORDER — BUTALBITAL-APAP-CAFFEINE 50-325-40 MG PO TABS
1.0000 | ORAL_TABLET | ORAL | Status: DC | PRN
  Administered 2019-07-03 – 2019-07-06 (×4): 1 via ORAL
  Filled 2019-07-03 (×5): qty 1

## 2019-07-03 MED ORDER — HYDRALAZINE HCL 25 MG PO TABS
25.0000 mg | ORAL_TABLET | Freq: Three times a day (TID) | ORAL | Status: DC
  Administered 2019-07-03 – 2019-07-04 (×4): 25 mg via ORAL
  Filled 2019-07-03 (×4): qty 1

## 2019-07-03 NOTE — Progress Notes (Signed)
2 Bags of home meds. sent to [facility]. Counted by Stephine R.N.

## 2019-07-03 NOTE — Progress Notes (Addendum)
B.P. after labetalol  206/94 . R.N.aware and gave 10 mg Hydralazine I.V. with N.S. . Also change B.P. cuff to large size B.P. 172/.65 R.N. aware

## 2019-07-03 NOTE — Progress Notes (Signed)
PROGRESS NOTE    [name] [name]  LYY:[id number] DOB: 02-Jun-1960 DOA: 07/02/2019 PCP: [name], MD    Brief Narrative:  59 y.o. female with medical history significant of CKD stage V, GERD, hypertension, hyperlipidemia, insulin-dependent type 2 diabetes presenting with complaints of high blood pressure readings. Patient reports 1 month history of significantly elevated blood pressure readings with systolic above 127.  States her doctor increased the dose of her home labetalol but her blood pressure continued to be high.  She also takes clonidine and reports compliance.  States she had a left arm dialysis fistula placed a month ago and was told it was malfunctioning.  She went to for short stay today to get another fistula placed but it was noted that her blood pressure was too high so they advised her to come into the ED to be evaluated.  Reports having mild headaches.  Also reports intermittent episodes of pressure-like substernal chest pain radiating to her left arm for the past few days.  She has also been feeling short of breath with exertion.  Denies cough.  She has been vaccinated for Covid.  Denies abdominal pain.  Denies any difficulty making urine.  ED Course: Blood pressure significantly elevated with systolic up to 517G.  WBC count mildly elevated at 12.3, consistent with prior labs.  Hemoglobin 10.3, no significant change from baseline.  Potassium borderline elevated at 5.2.  Bicarb 22.  Creatinine 3.9, improved compared to labs done a month ago.  EKG showing new T wave inversions in inferior leads.  High-sensitivity troponin negative x2.  Screening SARS-CoV-2 PCR test pending.  Chest x-ray showing no active cardiopulmonary disease.  Head CT negative for acute intracranial abnormality.  Assessment & Plan:   Principal Problem:   Hypertensive urgency Active Problems:   GERD (gastroesophageal reflux disease)   Chest pain   Type 2 diabetes mellitus (HCC)   CKD (chronic kidney  disease), stage V (HCC)   Hypertensive urgency:  -Patient reports 1 month history of significantly elevated blood pressure with systolic above 017 despite increasing the dose of her home labetalol.  Also reports compliance with clonidine.   -Blood pressure significantly elevated in the ED with systolic into the 494W, . -Resume home clonidine.   -Some improvement with IV hydralazine PRN and IV labetalol PRN  -started scheduled PO hydralazine and increased home clonidine to TID dosing -SBP down to 129 this afternoon, down from 260's 24hrs prior. Complaining of headache this AM. Given labile BP with headache, would cont to monitor for now, as bp is trending up again  Chest pain: Suspect related to hypertensive urgency.  EKG showing new T wave inversions in inferior leads.  However, high-sensitivity troponin checked twice reassuring.  No chest pains -Continue cardiac monitoring.  Management of hypertensive emergency as mentioned above.  CKD stage V: Patient has a failed left arm basilic vein fistula.  Plan was for her to get left arm basilic vein fistula creation versus graft today but was sent to the ED from short stay due to hypertensive crisis.  Creatinine 3.9, improved compared to labs done a month ago.  Potassium borderline elevated at 5.2 and consistent with prior labs.  Bicarb 22.  Patient does not appear volume overloaded on exam. -Continue home Lokelma, calcitriol, and bicarb supplementation.   -Recommend close follow-up with vascular surgery and nephrology.  Insulin-dependent type 2 diabetes:A1c 6.9.  Continue home Novolin and 20 units twice daily.  Continue sliding scale insulin sensitive ACHS and CBG checks.  GERD:  Continue PPI  DVT prophylaxis: Heparin subq Code Status: Full Family Communication: Pt in room, family at bedside  Status is: Observation  The patient will require care spanning > 2 midnights and should be moved to inpatient because: Inpatient level of care  appropriate due to severity of illness, need to monitor labile blood pressure  Dispo: The patient is from: Home              Anticipated d/c is to: Home              Anticipated d/c date is: 1 day              Patient currently is not medically stable to d/c.       Consultants:     Procedures:     Antimicrobials: Anti-infectives (From admission, onward)   None       Subjective: Complaining of headache. Asking about going home  Objective: Vitals:   07/03/19 1022 07/03/19 1114 07/03/19 1342 07/03/19 1542  BP: (!) 173/84 (!) 180/80 (!) 129/53 (!) 158/67  Pulse:  69  70  Resp:  14  18  Temp:  98.3 F (36.8 C)  98.3 F (36.8 C)  TempSrc:  Oral  Oral  SpO2:  98%  99%  Weight:      Height:        Intake/Output Summary (Last 24 hours) at 07/03/2019 1622 Last data filed at 07/03/2019 1200 Gross per 24 hour  Intake --  Output 1125 ml  Net -1125 ml   Filed Weights   07/02/19 2338 07/03/19 0700  Weight: 119 kg 119.3 kg    Examination:  General exam: Appears calm and comfortable  Respiratory system: Clear to auscultation. Respiratory effort normal. Cardiovascular system: S1 & S2 heard, Regular Gastrointestinal system: Abdomen is nondistended, soft and nontender. No organomegaly or masses felt. Normal bowel sounds heard. Central nervous system: Alert and oriented. No focal neurological deficits. Extremities: Symmetric 5 x 5 power. Skin: No rashes, lesions Psychiatry: Judgement and insight appear normal. Mood & affect appropriate.   Data Reviewed: I have personally reviewed following labs and imaging studies  CBC: Recent Labs  Lab 07/02/19 1301 07/02/19 1459  WBC  --  12.3*  HGB 11.6* 10.3*  HCT 34.0* 31.5*  MCV  --  82.2  PLT  --  720   Basic Metabolic Panel: Recent Labs  Lab 07/02/19 1301 07/02/19 1459 07/03/19 0246  NA 139 139 138  K 5.4* 5.2* 5.2*  CL 108 107 106  CO2  --  22 24  GLUCOSE 207* 172* 332*  BUN 37* 38* 38*  CREATININE 4.30*  3.93* 3.98*  CALCIUM  --  9.0 8.4*   GFR: Estimated Creatinine Clearance: 19.9 mL/min (A) (by C-G formula based on SCr of 3.98 mg/dL (H)). Liver Function Tests: Recent Labs  Lab 07/02/19 2059  AST 17  ALT 15  ALKPHOS 144*  BILITOT 0.5  PROT 6.6  ALBUMIN 3.1*   Recent Labs  Lab 07/02/19 2059  LIPASE 38   No results for input(s): AMMONIA in the last 168 hours. Coagulation Profile: No results for input(s): INR, PROTIME in the last 168 hours. Cardiac Enzymes: No results for input(s): CKTOTAL, CKMB, CKMBINDEX, TROPONINI in the last 168 hours. BNP (last 3 results) No results for input(s): PROBNP in the last 8760 hours. HbA1C: Recent Labs    07/03/19 0246  HGBA1C 6.9*   CBG: Recent Labs  Lab 07/02/19 1202 07/02/19 2338 07/03/19 0643 07/03/19 1135  GLUCAP  232* 246* 246* 206*   Lipid Profile: No results for input(s): CHOL, HDL, LDLCALC, TRIG, CHOLHDL, LDLDIRECT in the last 72 hours. Thyroid Function Tests: No results for input(s): TSH, T4TOTAL, FREET4, T3FREE, THYROIDAB in the last 72 hours. Anemia Panel: No results for input(s): VITAMINB12, FOLATE, FERRITIN, TIBC, IRON, RETICCTPCT in the last 72 hours. Sepsis Labs: No results for input(s): PROCALCITON, LATICACIDVEN in the last 168 hours.  Recent Results (from the past 240 hour(s))  SARS CORONAVIRUS 2 (TAT 6-24 HRS) Nasopharyngeal Nasopharyngeal Swab     Status: None   Collection Time: 07/01/19  2:24 PM   Specimen: Nasopharyngeal Swab  Result Value Ref Range Status   SARS Coronavirus 2 NEGATIVE NEGATIVE Final    Comment: (NOTE) SARS-CoV-2 target nucleic acids are NOT DETECTED. The SARS-CoV-2 RNA is generally detectable in upper and lower respiratory specimens during the acute phase of infection. Negative results do not preclude SARS-CoV-2 infection, do not rule out co-infections with other pathogens, and should not be used as the sole basis for treatment or other patient management decisions. Negative results  must be combined with clinical observations, patient history, and epidemiological information. The expected result is Negative. Fact Sheet for Patients: SugarRoll.be Fact Sheet for Healthcare Providers: https://www.woods-mathews.com/ This test is not yet approved or cleared by the [place] FDA and  has been authorized for detection and/or diagnosis of SARS-CoV-2 by FDA under an Emergency Use Authorization (EUA). This EUA will remain  in effect (meaning this test can be used) for the duration of the COVID-19 declaration under Section 56 4(b)(1) of the Act, 21 U.S.C. section 360bbb-3(b)(1), unless the authorization is terminated or revoked sooner. Performed at [facility], [place] [address] [place], [place] [address]   SARS Coronavirus 2 by RT PCR (hospital order, performed in [facility] hospital lab) Nasopharyngeal Nasopharyngeal Swab     Status: None   Collection Time: 07/02/19 10:34 PM   Specimen: Nasopharyngeal Swab  Result Value Ref Range Status   SARS Coronavirus 2 NEGATIVE NEGATIVE Final    Comment: (NOTE) SARS-CoV-2 target nucleic acids are NOT DETECTED. The SARS-CoV-2 RNA is generally detectable in upper and lower respiratory specimens during the acute phase of infection. The lowest concentration of SARS-CoV-2 viral copies this assay can detect is 250 copies / mL. A negative result does not preclude SARS-CoV-2 infection and should not be used as the sole basis for treatment or other patient management decisions.  A negative result may occur with improper specimen collection / handling, submission of specimen other than nasopharyngeal swab, presence of viral mutation(s) within the areas targeted by this assay, and inadequate number of viral copies (<250 copies / mL). A negative result must be combined with clinical observations, patient history, and epidemiological information. Fact Sheet for Patients:     StrictlyIdeas.no Fact Sheet for Healthcare Providers: BankingDealers.co.za This test is not yet approved or cleared  by the [place] FDA and has been authorized for detection and/or diagnosis of SARS-CoV-2 by FDA under an Emergency Use Authorization (EUA).  This EUA will remain in effect (meaning this test can be used) for the duration of the COVID-19 declaration under Section 564(b)(1) of the Act, 21 U.S.C. section 360bbb-3(b)(1), unless the authorization is terminated or revoked sooner. Performed at [facility], [place] [address] [place], [place] [address]      Radiology Studies: DG Chest 2 View  Result Date: 07/02/2019 CLINICAL DATA:  Headache and chest pain. EXAM: CHEST - 2 VIEW COMPARISON:  October 31, 2018 FINDINGS: There is no evidence of acute infiltrate, pleural effusion or pneumothorax. The heart size and mediastinal contours are within normal limits. The visualized skeletal structures are unremarkable. IMPRESSION: No active cardiopulmonary disease. Electronically Signed   By: Virgina Norfolk M.D.   On: 07/02/2019 15:24   CT Head Wo Contrast  Result Date: 07/02/2019 CLINICAL DATA:  Mental status change. Hypertensive emergency. Headache. EXAM: CT HEAD WITHOUT CONTRAST TECHNIQUE: Contiguous axial images were obtained from the base of the skull through the vertex without intravenous contrast. COMPARISON:  Head CT 08/18/2013 FINDINGS: Brain: No intracranial hemorrhage, mass effect, or midline shift. No hydrocephalus. The basilar cisterns are patent. No evidence of territorial infarct or acute ischemia. No extra-axial or intracranial fluid collection. Vascular: No hyperdense vessel or unexpected calcification. Skull: No fracture or focal lesion. Sinuses/Orbits: Paranasal sinuses and mastoid air cells are clear. The visualized orbits are unremarkable. Other: None. IMPRESSION: Negative noncontrast head CT. Electronically Signed    By: Keith Rake M.D.   On: 07/02/2019 21:34    Scheduled Meds: . calcitRIOL  0.25 mcg Oral BID  . cloNIDine  0.2 mg Oral TID  . heparin  5,000 Units Subcutaneous Q8H  . hydrALAZINE  25 mg Oral Q8H  . insulin aspart  0-5 Units Subcutaneous QHS  . insulin aspart  0-9 Units Subcutaneous TID WC  . insulin NPH Human  20 Units Subcutaneous BID  . pantoprazole  20 mg Oral BID AC  . sodium bicarbonate  1,300 mg Oral BID  . sodium chloride flush  3 mL Intravenous Once  . sodium zirconium cyclosilicate  10 g Oral Daily   Continuous Infusions:   LOS: 0 days   Marylu Lund, MD [facility] Pager On Amion  If 7PM-7AM, please contact night-coverage 07/03/2019, 4:22 PM

## 2019-07-03 NOTE — Progress Notes (Signed)
Patient received via stretcher with E.R. Nurse. Alert and orin. Orin. To room R.N. into asses patient.B.P. remains high. See vital signs sheet.

## 2019-07-03 NOTE — Progress Notes (Signed)
Labetalol 10 mg I.V. given w/ N.S.  for SBP 216/100

## 2019-07-03 NOTE — Progress Notes (Signed)
Patient B.P. 176/71 Labetalol 10 mg I.V. given

## 2019-07-03 NOTE — Progress Notes (Signed)
Patient refuses Novolog insulin due to it causes cramping. Patient also would like to talk with M.D.about why she is not getting her Glimepiride 4 mg every Morning and at bedtime.

## 2019-07-04 DIAGNOSIS — I161 Hypertensive emergency: Principal | ICD-10-CM

## 2019-07-04 LAB — COMPREHENSIVE METABOLIC PANEL
ALT: 15 U/L (ref 0–44)
AST: 14 U/L — ABNORMAL LOW (ref 15–41)
Albumin: 2.8 g/dL — ABNORMAL LOW (ref 3.5–5.0)
Alkaline Phosphatase: 132 U/L — ABNORMAL HIGH (ref 38–126)
Anion gap: 8 (ref 5–15)
BUN: 38 mg/dL — ABNORMAL HIGH (ref 6–20)
CO2: 25 mmol/L (ref 22–32)
Calcium: 8.8 mg/dL — ABNORMAL LOW (ref 8.9–10.3)
Chloride: 106 mmol/L (ref 98–111)
Creatinine, Ser: 4.11 mg/dL — ABNORMAL HIGH (ref 0.44–1.00)
GFR calc Af Amer: 13 mL/min — ABNORMAL LOW (ref >60)
GFR calc non Af Amer: 11 mL/min — ABNORMAL LOW (ref >60)
Glucose, Bld: 122 mg/dL — ABNORMAL HIGH (ref 70–99)
Potassium: 4.7 mmol/L (ref 3.5–5.1)
Sodium: 139 mmol/L (ref 135–145)
Total Bilirubin: 0.8 mg/dL (ref 0.3–1.2)
Total Protein: 5.8 g/dL — ABNORMAL LOW (ref 6.5–8.1)

## 2019-07-04 LAB — GLUCOSE, CAPILLARY
Glucose-Capillary: 105 mg/dL — ABNORMAL HIGH (ref 70–99)
Glucose-Capillary: 145 mg/dL — ABNORMAL HIGH (ref 70–99)
Glucose-Capillary: 170 mg/dL — ABNORMAL HIGH (ref 70–99)
Glucose-Capillary: 118 mg/dL — ABNORMAL HIGH (ref 70–99)

## 2019-07-04 LAB — CBC
HCT: 29.4 % — ABNORMAL LOW (ref 36.0–46.0)
Hemoglobin: 9.8 g/dL — ABNORMAL LOW (ref 12.0–15.0)
MCH: 27.5 pg (ref 26.0–34.0)
MCHC: 33.3 g/dL (ref 30.0–36.0)
MCV: 82.6 fL (ref 80.0–100.0)
Platelets: 297 10*3/uL (ref 150–400)
RBC: 3.56 MIL/uL — ABNORMAL LOW (ref 3.87–5.11)
RDW: 16 % — ABNORMAL HIGH (ref 11.5–15.5)
WBC: 11 10*3/uL — ABNORMAL HIGH (ref 4.0–10.5)
nRBC: 0 % (ref 0.0–0.2)

## 2019-07-04 MED ORDER — ALUM & MAG HYDROXIDE-SIMETH 200-200-20 MG/5ML PO SUSP
30.0000 mL | ORAL | Status: DC | PRN
  Administered 2019-07-04: 30 mL via ORAL
  Filled 2019-07-04: qty 30

## 2019-07-04 MED ORDER — LABETALOL HCL 300 MG PO TABS
300.0000 mg | ORAL_TABLET | Freq: Two times a day (BID) | ORAL | Status: DC
  Administered 2019-07-04 – 2019-07-06 (×5): 300 mg via ORAL
  Filled 2019-07-04 (×5): qty 1

## 2019-07-04 MED ORDER — HYDRALAZINE HCL 50 MG PO TABS
50.0000 mg | ORAL_TABLET | Freq: Three times a day (TID) | ORAL | Status: DC
  Administered 2019-07-04 – 2019-07-05 (×3): 50 mg via ORAL
  Filled 2019-07-04 (×3): qty 1

## 2019-07-04 NOTE — Progress Notes (Signed)
Labetalol 10 mg I.V. for B.P. 189/83

## 2019-07-04 NOTE — Progress Notes (Signed)
PROGRESS NOTE    [name] [name]  HLK:[id number] DOB: Dec 30, 1960 DOA: 07/02/2019 PCP: [name], MD    Brief Narrative:  59 y.o. female with medical history significant of CKD stage V, GERD, hypertension, hyperlipidemia, insulin-dependent type 2 diabetes presenting with complaints of high blood pressure readings. Patient reports 1 month history of significantly elevated blood pressure readings with systolic above 734.  States her doctor increased the dose of her home labetalol but her blood pressure continued to be high.  She also takes clonidine and reports compliance.  States she had a left arm dialysis fistula placed a month ago and was told it was malfunctioning.  She went to for short stay today to get another fistula placed but it was noted that her blood pressure was too high so they advised her to come into the ED to be evaluated.  Reports having mild headaches.  Also reports intermittent episodes of pressure-like substernal chest pain radiating to her left arm for the past few days.  She has also been feeling short of breath with exertion.  Denies cough.  She has been vaccinated for Covid.  Denies abdominal pain.  Denies any difficulty making urine.  ED Course: Blood pressure significantly elevated with systolic up to 287G.  WBC count mildly elevated at 12.3, consistent with prior labs.  Hemoglobin 10.3, no significant change from baseline.  Potassium borderline elevated at 5.2.  Bicarb 22.  Creatinine 3.9, improved compared to labs done a month ago.  EKG showing new T wave inversions in inferior leads.  High-sensitivity troponin negative x2.  Screening SARS-CoV-2 PCR test pending.  Chest x-ray showing no active cardiopulmonary disease.  Head CT negative for acute intracranial abnormality.  Assessment & Plan:   Principal Problem:   Hypertensive urgency Active Problems:   GERD (gastroesophageal reflux disease)   Chest pain   Type 2 diabetes mellitus ([place])   CKD (chronic kidney  disease), stage V ([place])   Hypertensive emergency   Hypertensive urgency:  -Patient reports 1 month history of significantly elevated blood pressure with systolic above 811 despite increasing the dose of her home labetalol.  Also reports compliance with clonidine.   -Blood pressure significantly elevated in the ED with systolic into the 572I, . -Resume home clonidine.   -Some improvement with IV hydralazine PRN and IV labetalol PRN  -started scheduled PO hydralazine and increased home clonidine to TID dosing -SBP remained elevated overnight.  -Pt also on labetalol PO 500mg  BID. Have resumed labetalol at 300mg  bid -SBP improved quickly with beta blocker, but trended up again to the upper 170's. Patient still requiring multiple IV PRN BP meds to control BP. Bp Overall remains very labile  Chest pain: Suspect related to hypertensive urgency.  EKG showing new T wave inversions in inferior leads.  However, high-sensitivity troponin checked twice reassuring.  No chest pains -Continue cardiac monitoring.  Continue management of hypertensive emergency as mentioned above.  CKD stage V: Patient has a failed left arm basilic vein fistula.  Plan was for her to get left arm basilic vein fistula creation versus graft today but was sent to the ED from short stay due to hypertensive crisis.  Creatinine 3.9, improved compared to labs done a month ago.  Potassium borderline elevated at 5.2 and consistent with prior labs.  Bicarb 22.  Patient does not appear volume overloaded on exam. -Continue home Lokelma, calcitriol, and bicarb supplementation.   -Recommend close follow-up with vascular surgery and nephrology as outpatient  Insulin-dependent type 2 diabetes:A1c  6.9.  Continue home Novolin and 20 units twice daily.  Continue sliding scale insulin sensitive ACHS and CBG checks.  GERD: Continue PPI  DVT prophylaxis: Heparin subq Code Status: Full Family Communication: Pt in room, family at  bedside  Status is: Observation  The patient will require care spanning > 2 midnights and should be moved to inpatient because: Inpatient level of care appropriate due to severity of illness, need to monitor labile blood pressure as BP remains very labile and still requiring IV PRN BP meds  Dispo: The patient is from: Home              Anticipated d/c is to: Home              Anticipated d/c date is: 1 day              Patient currently is not medically stable to d/c.    Consultants:     Procedures:     Antimicrobials: Anti-infectives (From admission, onward)   None      Subjective: Initially asking about going home, now would like to stay, feeling concerned about her fluctuating blood pressures  Objective: Vitals:   07/04/19 1209 07/04/19 1503 07/04/19 1520 07/04/19 1616  BP: 129/66 (!) 178/86 124/71 129/70  Pulse: 69 65 69 68  Resp: 14 17 18 17   Temp: 98.1 F (36.7 C)   98 F (36.7 C)  TempSrc: Oral   Oral  SpO2: 97% 94% 98% 98%  Weight:      Height:        Intake/Output Summary (Last 24 hours) at 07/04/2019 1732 Last data filed at 07/04/2019 0557 Gross per 24 hour  Intake 420 ml  Output 300 ml  Net 120 ml   Filed Weights   07/02/19 2338 07/03/19 0700 07/04/19 0650  Weight: 119 kg 119.3 kg 118.4 kg    Examination: General exam: Awake, laying in bed, in nad Respiratory system: Normal respiratory effort, no wheezing Cardiovascular system: regular rate, s1, s2 Gastrointestinal system: Soft, nondistended, positive BS Central nervous system: CN2-12 grossly intact, strength intact Extremities: Perfused, no clubbing Skin: Normal skin turgor, no notable skin lesions seen Psychiatry: Mood normal // no visual hallucinations   Data Reviewed: I have personally reviewed following labs and imaging studies  CBC: Recent Labs  Lab 07/02/19 1301 07/02/19 1459 07/04/19 0330  WBC  --  12.3* 11.0*  HGB 11.6* 10.3* 9.8*  HCT 34.0* 31.5* 29.4*  MCV  --  82.2 82.6   PLT  --  283 161   Basic Metabolic Panel: Recent Labs  Lab 07/02/19 1301 07/02/19 1459 07/03/19 0246 07/04/19 0330  NA 139 139 138 139  K 5.4* 5.2* 5.2* 4.7  CL 108 107 106 106  CO2  --  22 24 25   GLUCOSE 207* 172* 332* 122*  BUN 37* 38* 38* 38*  CREATININE 4.30* 3.93* 3.98* 4.11*  CALCIUM  --  9.0 8.4* 8.8*   GFR: Estimated Creatinine Clearance: 19.2 mL/min (A) (by C-G formula based on SCr of 4.11 mg/dL (H)). Liver Function Tests: Recent Labs  Lab 07/02/19 2059 07/04/19 0330  AST 17 14*  ALT 15 15  ALKPHOS 144* 132*  BILITOT 0.5 0.8  PROT 6.6 5.8*  ALBUMIN 3.1* 2.8*   Recent Labs  Lab 07/02/19 2059  LIPASE 38   No results for input(s): AMMONIA in the last 168 hours. Coagulation Profile: No results for input(s): INR, PROTIME in the last 168 hours. Cardiac Enzymes: No results for  input(s): CKTOTAL, CKMB, CKMBINDEX, TROPONINI in the last 168 hours. BNP (last 3 results) No results for input(s): PROBNP in the last 8760 hours. HbA1C: Recent Labs    07/03/19 0246  HGBA1C 6.9*   CBG: Recent Labs  Lab 07/03/19 1636 07/03/19 2144 07/04/19 0649 07/04/19 1108 07/04/19 1612  GLUCAP 160* 189* 105* 145* 170*   Lipid Profile: No results for input(s): CHOL, HDL, LDLCALC, TRIG, CHOLHDL, LDLDIRECT in the last 72 hours. Thyroid Function Tests: No results for input(s): TSH, T4TOTAL, FREET4, T3FREE, THYROIDAB in the last 72 hours. Anemia Panel: No results for input(s): VITAMINB12, FOLATE, FERRITIN, TIBC, IRON, RETICCTPCT in the last 72 hours. Sepsis Labs: No results for input(s): PROCALCITON, LATICACIDVEN in the last 168 hours.  Recent Results (from the past 240 hour(s))  SARS CORONAVIRUS 2 (TAT 6-24 HRS) Nasopharyngeal Nasopharyngeal Swab     Status: None   Collection Time: 07/01/19  2:24 PM   Specimen: Nasopharyngeal Swab  Result Value Ref Range Status   SARS Coronavirus 2 NEGATIVE NEGATIVE Final    Comment: (NOTE) SARS-CoV-2 target nucleic acids are NOT  DETECTED. The SARS-CoV-2 RNA is generally detectable in upper and lower respiratory specimens during the acute phase of infection. Negative results do not preclude SARS-CoV-2 infection, do not rule out co-infections with other pathogens, and should not be used as the sole basis for treatment or other patient management decisions. Negative results must be combined with clinical observations, patient history, and epidemiological information. The expected result is Negative. Fact Sheet for Patients: SugarRoll.be Fact Sheet for Healthcare Providers: https://www.woods-mathews.com/ This test is not yet approved or cleared by the [place] FDA and  has been authorized for detection and/or diagnosis of SARS-CoV-2 by FDA under an Emergency Use Authorization (EUA). This EUA will remain  in effect (meaning this test can be used) for the duration of the COVID-19 declaration under Section 56 4(b)(1) of the Act, 21 U.S.C. section 360bbb-3(b)(1), unless the authorization is terminated or revoked sooner. Performed at [facility], [place] [address] [place], [place] [address]   SARS Coronavirus 2 by RT PCR (hospital order, performed in [facility] hospital lab) Nasopharyngeal Nasopharyngeal Swab     Status: None   Collection Time: 07/02/19 10:34 PM   Specimen: Nasopharyngeal Swab  Result Value Ref Range Status   SARS Coronavirus 2 NEGATIVE NEGATIVE Final    Comment: (NOTE) SARS-CoV-2 target nucleic acids are NOT DETECTED. The SARS-CoV-2 RNA is generally detectable in upper and lower respiratory specimens during the acute phase of infection. The lowest concentration of SARS-CoV-2 viral copies this assay can detect is 250 copies / mL. A negative result does not preclude SARS-CoV-2 infection and should not be used as the sole basis for treatment or other patient management decisions.  A negative result may occur with improper specimen collection /  handling, submission of specimen other than nasopharyngeal swab, presence of viral mutation(s) within the areas targeted by this assay, and inadequate number of viral copies (<250 copies / mL). A negative result must be combined with clinical observations, patient history, and epidemiological information. Fact Sheet for Patients:   StrictlyIdeas.no Fact Sheet for Healthcare Providers: BankingDealers.co.za This test is not yet approved or cleared  by the [place] FDA and has been authorized for detection and/or diagnosis of SARS-CoV-2 by FDA under an Emergency Use Authorization (EUA).  This EUA will remain in effect (meaning this test can be used) for the duration of the COVID-19 declaration under Section 564(b)(1) of the Act, 21 U.S.C.  section 360bbb-3(b)(1), unless the authorization is terminated or revoked sooner. Performed at [facility], [place] [address], [place], [place] [address]   Urine culture     Status: None   Collection Time: 07/02/19 11:48 PM   Specimen: Urine, Random  Result Value Ref Range Status   Specimen Description URINE, RANDOM  Final   Special Requests NONE  Final   Culture   Final    NO GROWTH Performed at [facility], [place] [address], [place], [place] [address]    Report Status 07/03/2019 FINAL  Final     Radiology Studies: CT Head Wo Contrast  Result Date: 07/02/2019 CLINICAL DATA:  Mental status change. Hypertensive emergency. Headache. EXAM: CT HEAD WITHOUT CONTRAST TECHNIQUE: Contiguous axial images were obtained from the base of the skull through the vertex without intravenous contrast. COMPARISON:  Head CT 08/18/2013 FINDINGS: Brain: No intracranial hemorrhage, mass effect, or midline shift. No hydrocephalus. The basilar cisterns are patent. No evidence of territorial infarct or acute ischemia. No extra-axial or intracranial fluid collection. Vascular: No hyperdense vessel or unexpected  calcification. Skull: No fracture or focal lesion. Sinuses/Orbits: Paranasal sinuses and mastoid air cells are clear. The visualized orbits are unremarkable. Other: None. IMPRESSION: Negative noncontrast head CT. Electronically Signed   By: Keith Rake M.D.   On: 07/02/2019 21:34    Scheduled Meds: . calcitRIOL  0.25 mcg Oral BID  . cloNIDine  0.2 mg Oral TID  . heparin  5,000 Units Subcutaneous Q8H  . hydrALAZINE  50 mg Oral Q8H  . insulin aspart  0-5 Units Subcutaneous QHS  . insulin aspart  0-9 Units Subcutaneous TID WC  . insulin NPH Human  20 Units Subcutaneous BID  . labetalol  300 mg Oral BID  . pantoprazole  20 mg Oral BID AC  . sodium bicarbonate  1,300 mg Oral BID  . sodium chloride flush  3 mL Intravenous Once  . sodium zirconium cyclosilicate  10 g Oral Daily   Continuous Infusions:   LOS: 1 day   Marylu Lund, MD [facility] Pager On Amion  If 7PM-7AM, please contact night-coverage 07/04/2019, 5:32 PM

## 2019-07-04 NOTE — Progress Notes (Signed)
Labetalol 10 mg I.V. for B.P. 170/88

## 2019-07-04 NOTE — Progress Notes (Signed)
Patient c/o burning in chest. [facility] Orders for Maalox. Maalox 30 ml given and patient got relief of burning in her chest.

## 2019-07-04 NOTE — Progress Notes (Signed)
Labetalol 10 mg I.V. for B.P. 189/85

## 2019-07-05 ENCOUNTER — Encounter (HOSPITAL_COMMUNITY): Payer: Medicaid Other

## 2019-07-05 LAB — COMPREHENSIVE METABOLIC PANEL
ALT: 12 U/L (ref 0–44)
AST: 12 U/L — ABNORMAL LOW (ref 15–41)
Albumin: 2.9 g/dL — ABNORMAL LOW (ref 3.5–5.0)
Alkaline Phosphatase: 127 U/L — ABNORMAL HIGH (ref 38–126)
Anion gap: 7 (ref 5–15)
BUN: 37 mg/dL — ABNORMAL HIGH (ref 6–20)
CO2: 24 mmol/L (ref 22–32)
Calcium: 8.5 mg/dL — ABNORMAL LOW (ref 8.9–10.3)
Chloride: 106 mmol/L (ref 98–111)
Creatinine, Ser: 4.4 mg/dL — ABNORMAL HIGH (ref 0.44–1.00)
GFR calc Af Amer: 12 mL/min — ABNORMAL LOW (ref >60)
GFR calc non Af Amer: 10 mL/min — ABNORMAL LOW (ref >60)
Glucose, Bld: 87 mg/dL (ref 70–99)
Potassium: 4.4 mmol/L (ref 3.5–5.1)
Sodium: 137 mmol/L (ref 135–145)
Total Bilirubin: 0.2 mg/dL — ABNORMAL LOW (ref 0.3–1.2)
Total Protein: 5.9 g/dL — ABNORMAL LOW (ref 6.5–8.1)

## 2019-07-05 LAB — GLUCOSE, CAPILLARY
Glucose-Capillary: 128 mg/dL — ABNORMAL HIGH (ref 70–99)
Glucose-Capillary: 173 mg/dL — ABNORMAL HIGH (ref 70–99)
Glucose-Capillary: 119 mg/dL — ABNORMAL HIGH (ref 70–99)
Glucose-Capillary: 115 mg/dL — ABNORMAL HIGH (ref 70–99)

## 2019-07-05 MED ORDER — HYDRALAZINE HCL 50 MG PO TABS
50.0000 mg | ORAL_TABLET | Freq: Three times a day (TID) | ORAL | Status: DC
  Administered 2019-07-05 – 2019-07-06 (×3): 50 mg via ORAL
  Filled 2019-07-05 (×3): qty 1

## 2019-07-05 MED ORDER — HYDRALAZINE HCL 50 MG PO TABS
75.0000 mg | ORAL_TABLET | Freq: Three times a day (TID) | ORAL | Status: DC
  Administered 2019-07-05: 75 mg via ORAL
  Filled 2019-07-05: qty 1

## 2019-07-05 MED ORDER — ISOSORBIDE MONONITRATE ER 30 MG PO TB24
15.0000 mg | ORAL_TABLET | Freq: Every day | ORAL | Status: DC
  Administered 2019-07-05 – 2019-07-06 (×2): 15 mg via ORAL
  Filled 2019-07-05 (×2): qty 1

## 2019-07-05 MED ORDER — HYDRALAZINE HCL 25 MG PO TABS
25.0000 mg | ORAL_TABLET | Freq: Once | ORAL | Status: AC
  Administered 2019-07-05: 25 mg via ORAL
  Filled 2019-07-05: qty 1

## 2019-07-05 NOTE — Progress Notes (Signed)
PROGRESS NOTE    [name] [name]  BWI:[id number] DOB: 1960/05/10 DOA: 07/02/2019 PCP: [name], MD    Brief Narrative:  59 y.o. female with medical history significant of CKD stage V, GERD, hypertension, hyperlipidemia, insulin-dependent type 2 diabetes presenting with complaints of high blood pressure readings. Patient reports 1 month history of significantly elevated blood pressure readings with systolic above 638.  States her doctor increased the dose of her home labetalol but her blood pressure continued to be high.  She also takes clonidine and reports compliance.  States she had a left arm dialysis fistula placed a month ago and was told it was malfunctioning.  She went to for short stay today to get another fistula placed but it was noted that her blood pressure was too high so they advised her to come into the ED to be evaluated.  Reports having mild headaches.  Also reports intermittent episodes of pressure-like substernal chest pain radiating to her left arm for the past few days.  She has also been feeling short of breath with exertion.  Denies cough.  She has been vaccinated for Covid.  Denies abdominal pain.  Denies any difficulty making urine.  ED Course: Blood pressure significantly elevated with systolic up to 453M.  WBC count mildly elevated at 12.3, consistent with prior labs.  Hemoglobin 10.3, no significant change from baseline.  Potassium borderline elevated at 5.2.  Bicarb 22.  Creatinine 3.9, improved compared to labs done a month ago.  EKG showing new T wave inversions in inferior leads.  High-sensitivity troponin negative x2.  Screening SARS-CoV-2 PCR test pending.  Chest x-ray showing no active cardiopulmonary disease.  Head CT negative for acute intracranial abnormality.  Assessment & Plan:   Principal Problem:   Hypertensive urgency Active Problems:   GERD (gastroesophageal reflux disease)   Chest pain   Type 2 diabetes mellitus ([place])   CKD (chronic kidney  disease), stage V ([place])   Hypertensive emergency   Hypertensive urgency:  -Patient reports 1 month history of significantly elevated blood pressure with systolic above 468 despite increasing the dose of her home labetalol.  Also reports compliance with clonidine.   -Blood pressure significantly elevated in the ED with systolic into the 032Z, . -Resume home clonidine.   -Some improvement with IV hydralazine PRN and IV labetalol PRN  -started scheduled PO hydralazine and increased home clonidine to TID dosing -Pt also on labetalol PO 500mg  BID. Have continued on labetalol at 300mg  bid -BP trends improved today, however remains quite labile. Have added low dose imdur -Pt had been on norvasc previously, however this was stopped by PCP due to swelling -Will check renal artery [place]. Mild renal atherosclerosis seen on CTA abd in 2015, reviewed  Chest pain: Suspect related to hypertensive urgency.  EKG showing new T wave inversions in inferior leads.  However, high-sensitivity troponin checked twice reassuring.  No chest pains -Continue cardiac monitoring.  Continue management of hypertensive emergency as mentioned above. -Stable  CKD stage V: Patient has a failed left arm basilic vein fistula.  Plan was for her to get left arm basilic vein fistula creation versus graft today but was sent to the ED from short stay due to hypertensive crisis.  Creatinine 3.9, improved compared to labs done a month ago.  Potassium borderline elevated at 5.2 and consistent with prior labs.  Bicarb 22.  Patient does not appear volume overloaded on exam. -Continue home Lokelma, calcitriol, and bicarb supplementation.   -Recommend close follow-up with vascular  surgery and nephrology as outpatient when discharged  Insulin-dependent type 2 diabetes:A1c 6.9.  Continue home Novolin and 20 units twice daily.  Continue sliding scale insulin sensitive ACHS and CBG checks.  GERD: Continue PPI as tolerated  DVT prophylaxis:  Heparin subq Code Status: Full Family Communication: Pt in room, family at bedside  Status is: Inpatient  The patient will require care spanning > 2 midnights and should be moved to inpatient because: Inpatient level of care appropriate due to severity of illness, need to monitor labile blood pressure as BP remains very labile and still requiring IV PRN BP meds  Dispo: The patient is from: Home              Anticipated d/c is to: Home              Anticipated d/c date is: 1 day              Patient currently is not medically stable to d/c.    Consultants:     Procedures:     Antimicrobials: Anti-infectives (From admission, onward)   None      Subjective: Eager to go home, however willing to stay given elevated BP  Objective: Vitals:   07/05/19 1536 07/05/19 1548 07/05/19 1600 07/05/19 1630  BP: 129/61 120/65 138/64 (!) 151/75  Pulse: 70  70 68  Resp: 20  17 17   Temp: 98.2 F (36.8 C)     TempSrc: Oral     SpO2: 99%  96% 98%  Weight:      Height:       No intake or output data in the 24 hours ending 07/05/19 1741 Filed Weights   07/03/19 0700 07/04/19 0650 07/05/19 0520  Weight: 119.3 kg 118.4 kg 118.9 kg    Examination: General exam: Conversant, in no acute distress Respiratory system: normal chest rise, clear, no audible wheezing Cardiovascular system: regular rhythm, s1-s2 Gastrointestinal system: Nondistended, nontender, pos BS Central nervous system: No seizures, no tremors Extremities: No cyanosis, no joint deformities Skin: No rashes, no pallor Psychiatry: Affect normal // no auditory hallucinations   Data Reviewed: I have personally reviewed following labs and imaging studies  CBC: Recent Labs  Lab 07/02/19 1301 07/02/19 1459 07/04/19 0330  WBC  --  12.3* 11.0*  HGB 11.6* 10.3* 9.8*  HCT 34.0* 31.5* 29.4*  MCV  --  82.2 82.6  PLT  --  283 517   Basic Metabolic Panel: Recent Labs  Lab 07/02/19 1301 07/02/19 1459 07/03/19 0246  07/04/19 0330 07/05/19 0423  NA 139 139 138 139 137  K 5.4* 5.2* 5.2* 4.7 4.4  CL 108 107 106 106 106  CO2  --  22 24 25 24   GLUCOSE 207* 172* 332* 122* 87  BUN 37* 38* 38* 38* 37*  CREATININE 4.30* 3.93* 3.98* 4.11* 4.40*  CALCIUM  --  9.0 8.4* 8.8* 8.5*   GFR: Estimated Creatinine Clearance: 18 mL/min (A) (by C-G formula based on SCr of 4.4 mg/dL (H)). Liver Function Tests: Recent Labs  Lab 07/02/19 2059 07/04/19 0330 07/05/19 0423  AST 17 14* 12*  ALT 15 15 12   ALKPHOS 144* 132* 127*  BILITOT 0.5 0.8 0.2*  PROT 6.6 5.8* 5.9*  ALBUMIN 3.1* 2.8* 2.9*   Recent Labs  Lab 07/02/19 2059  LIPASE 38   No results for input(s): AMMONIA in the last 168 hours. Coagulation Profile: No results for input(s): INR, PROTIME in the last 168 hours. Cardiac Enzymes: No results for input(s):  CKTOTAL, CKMB, CKMBINDEX, TROPONINI in the last 168 hours. BNP (last 3 results) No results for input(s): PROBNP in the last 8760 hours. HbA1C: Recent Labs    07/03/19 0246  HGBA1C 6.9*   CBG: Recent Labs  Lab 07/04/19 1612 07/04/19 2122 07/05/19 0643 07/05/19 1128 07/05/19 1551  GLUCAP 170* 118* 128* 173* 119*   Lipid Profile: No results for input(s): CHOL, HDL, LDLCALC, TRIG, CHOLHDL, LDLDIRECT in the last 72 hours. Thyroid Function Tests: No results for input(s): TSH, T4TOTAL, FREET4, T3FREE, THYROIDAB in the last 72 hours. Anemia Panel: No results for input(s): VITAMINB12, FOLATE, FERRITIN, TIBC, IRON, RETICCTPCT in the last 72 hours. Sepsis Labs: No results for input(s): PROCALCITON, LATICACIDVEN in the last 168 hours.  Recent Results (from the past 240 hour(s))  SARS CORONAVIRUS 2 (TAT 6-24 HRS) Nasopharyngeal Nasopharyngeal Swab     Status: None   Collection Time: 07/01/19  2:24 PM   Specimen: Nasopharyngeal Swab  Result Value Ref Range Status   SARS Coronavirus 2 NEGATIVE NEGATIVE Final    Comment: (NOTE) SARS-CoV-2 target nucleic acids are NOT DETECTED. The SARS-CoV-2  RNA is generally detectable in upper and lower respiratory specimens during the acute phase of infection. Negative results do not preclude SARS-CoV-2 infection, do not rule out co-infections with other pathogens, and should not be used as the sole basis for treatment or other patient management decisions. Negative results must be combined with clinical observations, patient history, and epidemiological information. The expected result is Negative. Fact Sheet for Patients: SugarRoll.be Fact Sheet for Healthcare Providers: https://www.woods-mathews.com/ This test is not yet approved or cleared by the [place] FDA and  has been authorized for detection and/or diagnosis of SARS-CoV-2 by FDA under an Emergency Use Authorization (EUA). This EUA will remain  in effect (meaning this test can be used) for the duration of the COVID-19 declaration under Section 56 4(b)(1) of the Act, 21 U.S.C. section 360bbb-3(b)(1), unless the authorization is terminated or revoked sooner. Performed at [facility], [place] [address] [place], [place] [address]   SARS Coronavirus 2 by RT PCR (hospital order, performed in [facility] hospital lab) Nasopharyngeal Nasopharyngeal Swab     Status: None   Collection Time: 07/02/19 10:34 PM   Specimen: Nasopharyngeal Swab  Result Value Ref Range Status   SARS Coronavirus 2 NEGATIVE NEGATIVE Final    Comment: (NOTE) SARS-CoV-2 target nucleic acids are NOT DETECTED. The SARS-CoV-2 RNA is generally detectable in upper and lower respiratory specimens during the acute phase of infection. The lowest concentration of SARS-CoV-2 viral copies this assay can detect is 250 copies / mL. A negative result does not preclude SARS-CoV-2 infection and should not be used as the sole basis for treatment or other patient management decisions.  A negative result may occur with improper specimen collection / handling, submission of  specimen other than nasopharyngeal swab, presence of viral mutation(s) within the areas targeted by this assay, and inadequate number of viral copies (<250 copies / mL). A negative result must be combined with clinical observations, patient history, and epidemiological information. Fact Sheet for Patients:   StrictlyIdeas.no Fact Sheet for Healthcare Providers: BankingDealers.co.za This test is not yet approved or cleared  by the [place] FDA and has been authorized for detection and/or diagnosis of SARS-CoV-2 by FDA under an Emergency Use Authorization (EUA).  This EUA will remain in effect (meaning this test can be used) for the duration of the COVID-19 declaration under Section 564(b)(1) of the Act, 21 U.S.C. section  360bbb-3(b)(1), unless the authorization is terminated or revoked sooner. Performed at [facility], [place] [address] [place], [place] [address]   Urine culture     Status: None   Collection Time: 07/02/19 11:48 PM   Specimen: Urine, Random  Result Value Ref Range Status   Specimen Description URINE, RANDOM  Final   Special Requests NONE  Final   Culture   Final    NO GROWTH Performed at [facility], [place] [address] [place], [place] [address]    Report Status 07/03/2019 FINAL  Final     Radiology Studies: No results found.  Scheduled Meds: . calcitRIOL  0.25 mcg Oral BID  . cloNIDine  0.2 mg Oral TID  . heparin  5,000 Units Subcutaneous Q8H  . hydrALAZINE  50 mg Oral Q8H  . insulin aspart  0-5 Units Subcutaneous QHS  . insulin aspart  0-9 Units Subcutaneous TID WC  . insulin NPH Human  20 Units Subcutaneous BID  . isosorbide mononitrate  15 mg Oral Daily  . labetalol  300 mg Oral BID  . pantoprazole  20 mg Oral BID AC  . sodium bicarbonate  1,300 mg Oral BID  . sodium chloride flush  3 mL Intravenous Once  . sodium zirconium cyclosilicate  10 g Oral Daily   Continuous Infusions:   LOS: 2  days   Marylu Lund, MD [facility] Pager On Amion  If 7PM-7AM, please contact night-coverage 07/05/2019, 5:41 PM

## 2019-07-06 ENCOUNTER — Inpatient Hospital Stay (HOSPITAL_COMMUNITY): Payer: Medicaid Other

## 2019-07-06 DIAGNOSIS — I10 Essential (primary) hypertension: Secondary | ICD-10-CM

## 2019-07-06 LAB — COMPREHENSIVE METABOLIC PANEL
ALT: 12 U/L (ref 0–44)
AST: 17 U/L (ref 15–41)
Albumin: 2.8 g/dL — ABNORMAL LOW (ref 3.5–5.0)
Alkaline Phosphatase: 117 U/L (ref 38–126)
Anion gap: 10 (ref 5–15)
BUN: 40 mg/dL — ABNORMAL HIGH (ref 6–20)
CO2: 25 mmol/L (ref 22–32)
Calcium: 8.5 mg/dL — ABNORMAL LOW (ref 8.9–10.3)
Chloride: 102 mmol/L (ref 98–111)
Creatinine, Ser: 4.63 mg/dL — ABNORMAL HIGH (ref 0.44–1.00)
GFR calc Af Amer: 11 mL/min — ABNORMAL LOW (ref >60)
GFR calc non Af Amer: 10 mL/min — ABNORMAL LOW (ref >60)
Glucose, Bld: 75 mg/dL (ref 70–99)
Potassium: 4.5 mmol/L (ref 3.5–5.1)
Sodium: 137 mmol/L (ref 135–145)
Total Bilirubin: 1 mg/dL (ref 0.3–1.2)
Total Protein: 6.1 g/dL — ABNORMAL LOW (ref 6.5–8.1)

## 2019-07-06 LAB — CBC
HCT: 29.6 % — ABNORMAL LOW (ref 36.0–46.0)
Hemoglobin: 10 g/dL — ABNORMAL LOW (ref 12.0–15.0)
MCH: 27.9 pg (ref 26.0–34.0)
MCHC: 33.8 g/dL (ref 30.0–36.0)
MCV: 82.5 fL (ref 80.0–100.0)
Platelets: 314 10*3/uL (ref 150–400)
RBC: 3.59 MIL/uL — ABNORMAL LOW (ref 3.87–5.11)
RDW: 16.4 % — ABNORMAL HIGH (ref 11.5–15.5)
WBC: 12.5 10*3/uL — ABNORMAL HIGH (ref 4.0–10.5)
nRBC: 0 % (ref 0.0–0.2)

## 2019-07-06 LAB — GLUCOSE, CAPILLARY
Glucose-Capillary: 66 mg/dL — ABNORMAL LOW (ref 70–99)
Glucose-Capillary: 113 mg/dL — ABNORMAL HIGH (ref 70–99)
Glucose-Capillary: 109 mg/dL — ABNORMAL HIGH (ref 70–99)

## 2019-07-06 MED ORDER — HYDRALAZINE HCL 50 MG PO TABS: 50.0000 mg | ORAL_TABLET | Freq: Three times a day (TID) | ORAL | 0 refills | Status: DC

## 2019-07-06 MED ORDER — DEXTROSE 50 % IV SOLN
12.5000 g | INTRAVENOUS | Status: AC
  Administered 2019-07-06: 12.5 g via INTRAVENOUS
  Filled 2019-07-06: qty 50

## 2019-07-06 MED ORDER — BUTALBITAL-APAP-CAFFEINE 50-325-40 MG PO TABS: 1.0000 | ORAL_TABLET | ORAL | 0 refills | Status: DC | PRN

## 2019-07-06 MED ORDER — CLONIDINE HCL 0.2 MG PO TABS: 0.2000 mg | ORAL_TABLET | Freq: Three times a day (TID) | ORAL | 0 refills | Status: DC

## 2019-07-06 MED ORDER — ISOSORBIDE MONONITRATE ER 30 MG PO TB24: 15.0000 mg | ORAL_TABLET | Freq: Every day | ORAL | 0 refills | Status: DC

## 2019-07-06 MED ORDER — LABETALOL HCL 300 MG PO TABS: 300.0000 mg | ORAL_TABLET | Freq: Two times a day (BID) | ORAL | 0 refills | Status: DC

## 2019-07-06 MED FILL — ISOSORBIDE MN ER 30 MG TAB: 30 | 30 days supply | Qty: 15 | Fill #0

## 2019-07-06 MED FILL — hydrALAZINE HCL 50 MG TABS: 50 | 30 days supply | Qty: 90 | Fill #0

## 2019-07-06 MED FILL — BUTALB-ACETAMIN-CAFF 50-325: 50-325-40 | 14 days supply | Qty: 14 | Fill #0

## 2019-07-06 MED FILL — cloNIDine HCL 0.2 MG TABS: 0.2 | 30 days supply | Qty: 90 | Fill #0

## 2019-07-06 MED FILL — LABETALOL HCL 300 MG TABLET: 300 | 30 days supply | Qty: 60 | Fill #0

## 2019-07-06 NOTE — Progress Notes (Signed)
Renal artery duplex has been completed. Preliminary results can be found in CV Proc through chart review.   07/06/19 9:45 AM [name] [name] RVT

## 2019-07-06 NOTE — Progress Notes (Signed)
Inpatient Diabetes Program Recommendations  AACE/ADA: New Consensus Statement on Inpatient Glycemic Control (2015)  Target Ranges:  Prepandial:   less than 140 mg/dL      Peak postprandial:   less than 180 mg/dL (1-2 hours)      Critically ill patients:  140 - 180 mg/dL   Lab Results  Component Value Date   GLUCAP 113 (H) 07/06/2019   HGBA1C 6.9 (H) 07/03/2019    Review of Glycemic Control Results for KEIRI, SOLANO (MRN [id number]) as of 07/06/2019 09:19  Ref. Range 07/05/2019 06:43 07/05/2019 11:28 07/05/2019 15:51 07/05/2019 21:31 07/06/2019 06:16 07/06/2019 06:53  Glucose-Capillary Latest Ref Range: 70 - 99 mg/dL 128 (H) 173 (H) 119 (H) 115 (H) 66 (L) 113 (H)   Diabetes history: DM 2 Outpatient Diabetes medications: Humulin NPH 20 units bid, Amaryl 4 mg bid Current orders for Inpatient glycemic control:  NPH 20 units bid Novolog 0-9 units tid + hs  Inpatient Diabetes Program Recommendations:    Pt may d/c if stable today per MD note.   Glucose 66 this am. Per RN note pt was NPO past midnight? Diet currently ordered. If in plan of care reduce pm dose of NPH to 16-18 units.  Thanks,  Tama Headings RN, MSN, BC-ADM Inpatient Diabetes Coordinator Team Pager [phone] (8a-5p)

## 2019-07-06 NOTE — Progress Notes (Signed)
Discharge instructions given to patient. IV removed, clean and intact. Medications reviewed, all questions answered. Pt escorted home with husband. ° °Orris Perin R Yosselyn Tax, RN  °

## 2019-07-06 NOTE — Progress Notes (Signed)
Plan of care reviewed. Pt appeared alert and oriented x 4. Independently ambulated to bathroom. She's hemodynamically stable. Remained afebrile. BP 126/63 - 154/71 mmHg, HR 60s-70s. NSR on monitor. SPO2 96-98% on room air, RR 16-20.   Complained having headache. Tylenol and Tramadol given PRN. She able to rest and tolerated well. No incidents of immediate distress noted tonight. We will continue to monitor.  Kennyth Lose, RN

## 2019-07-06 NOTE — Progress Notes (Signed)
Results for SINIYAH, EVANGELIST (MRN [id number]) as of 07/06/2019 06:54  Ref. Range 07/06/2019 06:53  Glucose-Capillary Latest Ref Range: 70 - 99 mg/dL 113 (H)    Kennyth Lose, RN

## 2019-07-06 NOTE — Progress Notes (Signed)
Results for MILENA, LIGGETT (MRN [id number]) as of 07/06/2019 06:35  Ref. Range 07/05/2019 15:51 07/05/2019 21:31 07/06/2019 03:23 07/06/2019 06:16  Glucose-Capillary Latest Ref Range: 70 - 99 mg/dL 119 (H) 115 (H)  66 (L)   CBG 66 mg/dl at 0616 am. Hypoglycemic standing order initiated. Pt has been NPO after midnight, verbal order per RN day shift reported. 50% dextrose 12.5 mg IV given. Pt is asymptomatic. We will monitor CBG in the next 15 minutes.   Kennyth Lose, RN

## 2019-07-06 NOTE — Discharge Summary (Signed)
Physician Discharge Summary  [name] [name] TDV:[id number] DOB: 1960-03-01 DOA: 07/02/2019  PCP: [name] Sanes, MD  Admit date: 07/02/2019 Discharge date: 07/06/2019  Admitted From: Home Disposition:  Home  Recommendations for Outpatient Follow-up:  1. Follow up with PCP in 1-2 weeks 2. Follow up with Nephrology (Dr. [name]) as scheduled 3. Follow up with Vascular Surgery (Dr. [name]) as scheduled 4. Recommend continued bp med adjustment as outpatient  Discharge Condition:Improved CODE STATUS:Full Diet recommendation: Renal, diabetic   Brief/Interim Summary: 59 y.o.femalewith medical history significant ofCKD stage V, GERD, hypertension, hyperlipidemia, insulin-dependent type 2 diabetes presenting with complaints of high blood pressure readings.Patient reports 1 month history of significantly elevated blood pressure readings with systolic above 062. States her doctor increased the dose of her home labetalol but her blood pressure continued to be high. She also takes clonidine and reports compliance. States she had a left arm dialysis fistula placed a month ago and was told it was malfunctioning. She went to for short stay today to get another fistula placed but it was noted that her blood pressure was too high so they advised her to come into the ED to be evaluated. Reports having mild headaches. Also reports intermittent episodes of pressure-like substernal chest pain radiating to her left arm for the past few days. She has also been feeling short of breath with exertion. Denies cough. She has been vaccinated for Covid. Denies abdominal pain. Denies any difficulty making urine.  ED Course:Blood pressure significantly elevated with systolic up to 694W. WBC count mildly elevated at 12.3, consistent with prior labs. Hemoglobin 10.3, no significant change from baseline. Potassium borderline elevated at 5.2. Bicarb 22. Creatinine 3.9, improved compared to labs done a month ago. EKG  showing new T wave inversions in inferior leads. High-sensitivity troponin negative x2. Screening SARS-CoV-2 PCR test pending. Chest x-ray showing no active cardiopulmonary disease. Head CT negative for acute intracranial abnormality.   Discharge Diagnoses:  Principal Problem:   Hypertensive urgency Active Problems:   GERD (gastroesophageal reflux disease)   Chest pain   Type 2 diabetes mellitus ([place])   CKD (chronic kidney disease), stage V ([place])   Hypertensive emergency   Hypertensive urgency:  -Patient reports 1 month history of significantly elevated blood pressure with systolic above 546 despite increasing the dose of her home labetalol. Also reports compliance with clonidine.  -Blood pressure significantly elevated in the ED with systolic into the 270J, . -Resumed home clonidine.  -difficulty with BP management initially. Ultimately, BP improved with combination of hydralazine 50mg  q8h, lopressor 300mg  bid, clonidine 0.2mg  TID, imdur 15mg  daily with latest bp of 157/74 on d/c -Will ask pt's Nephrologist to cont to titrate bp med to achieve normotension -Renal artery US done on 5/24. Unremarkable.   Chest pain:Suspect related to hypertensive urgency. EKG showing new T wave inversions in inferior leads. However, high-sensitivity troponin checked twice reassuring. No chest pains -Likely related to hypertensive emergency as mentioned above. -Stable  CKD stage V:Patient has a failed left arm basilic vein fistula. Plan was for her to get left arm basilic vein fistula creation versus graft today but was sent to the ED from short stay due to hypertensive crisis. Creatinine 3.9, improved compared to labs done a month ago. Potassium borderline elevated at 5.2 and consistent with prior labs. Bicarb 22. Patient does not appear volume overloaded on exam. -Continue home Lokelma, calcitriol, and bicarb supplementation.  -Recommend close follow-up with vascular surgery and  nephrology as outpatient when discharged. Have discussed with Dr.  Cain  Insulin-dependent type 2 diabetes:A1c 6.9. Continue home Novolin and 20 units twice daily. Continued sliding scale insulin sensitive ACHSand CBG checks while in hospital. Resume home meds on d/c  GERD:Continued PPI as tolerated   Discharge Instructions   Allergies as of 07/06/2019      Reactions   Hydrochlorothiazide Itching   Amlodipine    Foot swelling/chest pain   Duloxetine    Hallucinations    Iodinated Diagnostic Agents    Pt is unaware of this allergy   Lisinopril Cough   Metformin    Unknown reaction   Codeine Itching   No hives or rash, takes benadryl   Dilaudid [hydromorphone Hcl] Itching   "mild itching hrs after dilaudid given"      Medication List    STOP taking these medications   oxymetazoline 0.05 % nasal spray Commonly known as: AFRIN     TAKE these medications   acetaminophen 500 MG tablet Commonly known as: TYLENOL Take 1,000 mg by mouth every 4 (four) hours as needed for moderate pain.   ammonium lactate 12 % lotion Commonly known as: LAC-HYDRIN Apply 1 application topically 2 (two) times daily as needed (itchy dry skin).   butalbital-acetaminophen-caffeine 50-325-40 MG tablet Commonly known as: FIORICET Take 1 tablet by mouth every 4 (four) hours as needed for headache.   calcitRIOL 0.25 MCG capsule Commonly known as: ROCALTROL Take 0.25 mcg by mouth 2 (two) times daily.   cloNIDine 0.2 MG tablet Commonly known as: CATAPRES Take 1 tablet (0.2 mg total) by mouth 3 (three) times daily. What changed: when to take this   cyclobenzaprine 10 MG tablet Commonly known as: FLEXERIL Take 10 mg by mouth daily as needed for muscle spasms.   gabapentin 300 MG capsule Commonly known as: NEURONTIN TAKE 1 CAPSULE BY MOUTH THREE TIMES DAILY What changed: when to take this   glimepiride 4 MG tablet Commonly known as: AMARYL Take 4 mg by mouth in the morning and at  bedtime.   HumuLIN N 100 UNIT/ML injection Generic drug: insulin NPH Human Inject 20 Units into the skin 2 (two) times daily.   hydrALAZINE 50 MG tablet Commonly known as: APRESOLINE Take 1 tablet (50 mg total) by mouth every 8 (eight) hours.   isosorbide mononitrate 30 MG 24 hr tablet Commonly known as: IMDUR Take 0.5 tablets (15 mg total) by mouth daily. Start taking on: Jul 07, 2019   labetalol 300 MG tablet Commonly known as: NORMODYNE Take 1 tablet (300 mg total) by mouth 2 (two) times daily. What changed:   when to take this  additional instructions  Another medication with the same name was removed. Continue taking this medication, and follow the directions you see here.   lidocaine 5 % Commonly known as: LIDODERM Place 1 patch onto the skin daily as needed for pain.   Lokelma 10 g Pack packet Generic drug: sodium zirconium cyclosilicate Take 10 g by mouth daily.   LUBRICATING EYE DROPS OP Place 1 drop into both eyes daily as needed (dry eyes).   nitroGLYCERIN 0.4 MG SL tablet Commonly known as: NITROSTAT Place 0.4 mg under the tongue every 5 (five) minutes as needed for chest pain.   omeprazole 20 MG capsule Commonly known as: PRILOSEC Take 1 capsule (20 mg total) by mouth 2 (two) times daily before a meal. What changed:   when to take this  reasons to take this   ProAir HFA 108 (90 Base) MCG/ACT inhaler Generic drug: albuterol Inhale 2  puffs into the lungs every 4 (four) hours as needed for wheezing or shortness of breath.   sodium bicarbonate 650 MG tablet Take 1,300 mg by mouth 2 (two) times daily.   traMADol 50 MG tablet Commonly known as: ULTRAM Take 1 tablet (50 mg total) by mouth every 6 (six) hours as needed for moderate pain.      Follow-up Information    Sistasis, Clair Gulling, MD. Schedule an appointment as soon as possible for a visit in 1 week(s).   Specialty: Family Medicine Contact information: [address]  [address] [phone]        Waynetta Sandy, MD Follow up.   Specialties: Vascular Surgery, Cardiology Why: as scheduled Contact information: [place] [address] [phone]        Claudia Desanctis, MD Follow up.   Specialty: Internal Medicine Why: as scheduled Contact information: [address] [place] [address] [phone]          Allergies  Allergen Reactions  . Hydrochlorothiazide Itching  . Amlodipine     Foot swelling/chest pain   . Duloxetine     Hallucinations   . Iodinated Diagnostic Agents     Pt is unaware of this allergy  . Lisinopril Cough  . Metformin     Unknown reaction  . Codeine Itching    No hives or rash, takes benadryl  . Dilaudid [Hydromorphone Hcl] Itching    "mild itching hrs after dilaudid given"      Procedures/Studies: DG Chest 2 View  Result Date: 07/02/2019 CLINICAL DATA:  Headache and chest pain. EXAM: CHEST - 2 VIEW COMPARISON:  October 31, 2018 FINDINGS: There is no evidence of acute infiltrate, pleural effusion or pneumothorax. The heart size and mediastinal contours are within normal limits. The visualized skeletal structures are unremarkable. IMPRESSION: No active cardiopulmonary disease. Electronically Signed   By: Virgina Norfolk M.D.   On: 07/02/2019 15:24   CT Head Wo Contrast  Result Date: 07/02/2019 CLINICAL DATA:  Mental status change. Hypertensive emergency. Headache. EXAM: CT HEAD WITHOUT CONTRAST TECHNIQUE: Contiguous axial images were obtained from the base of the skull through the vertex without intravenous contrast. COMPARISON:  Head CT 08/18/2013 FINDINGS: Brain: No intracranial hemorrhage, mass effect, or midline shift. No hydrocephalus. The basilar cisterns are patent. No evidence of territorial infarct or acute ischemia. No extra-axial or intracranial fluid collection. Vascular: No hyperdense vessel or unexpected calcification. Skull: No fracture or focal lesion. Sinuses/Orbits:  Paranasal sinuses and mastoid air cells are clear. The visualized orbits are unremarkable. Other: None. IMPRESSION: Negative noncontrast head CT. Electronically Signed   By: Keith Rake M.D.   On: 07/02/2019 21:34   VAS US RENAL ARTERY DUPLEX  Result Date: 07/06/2019 ABDOMINAL VISCERAL Indications: Hypertension High Risk Factors: Hypertension. Limitations: Obesity, air/bowel gas and patient positioning, patient movement. Comparison Study: No prior studies. Performing Technologist: Oliver Hum RVT  Examination Guidelines: A complete evaluation includes B-mode imaging, spectral Doppler, color Doppler, and power Doppler as needed of all accessible portions of each vessel. Bilateral testing is considered an integral part of a complete examination. Limited examinations for reoccurring indications may be performed as noted.  Duplex Findings: +--------------------+--------+--------+------+-------------------+ Mesenteric          PSV cm/sEDV cm/sPlaque     Comments       +--------------------+--------+--------+------+-------------------+ Aorta Mid  Unable to visualize +--------------------+--------+--------+------+-------------------+ Celiac Artery Origin                      Unable to visualize +--------------------+--------+--------+------+-------------------+ SMA Origin                                Unable to visualize +--------------------+--------+--------+------+-------------------+    +------------------+--------+--------+----------------------+ Right Renal ArteryPSV cm/sEDV cm/s       Comment         +------------------+--------+--------+----------------------+ Origin               60      8    Pre-stenotic waveforms +------------------+--------+--------+----------------------+ Proximal             30      6    Pre-stenotic waveforms +------------------+--------+--------+----------------------+ Mid                  32      6     Pre-stenotic waveforms +------------------+--------+--------+----------------------+ Distal               30      9    Pre-stenotic waveforms +------------------+--------+--------+----------------------+  Technologist observations: Due to patient body habitus, rib shadow, and bowel gas, unable to visualize the left kidney, aorta, celiac access, and SMA. +------------+--------+--------+----+-----------+--------+--------+---+ Right KidneyPSV cm/sEDV cm/sRI  Left KidneyPSV cm/sEDV cm/sRI  +------------+--------+--------+----+-----------+--------+--------+---+ Upper Pole  23      6       0.73Upper Pole                     +------------+--------+--------+----+-----------+--------+--------+---+ Mid         23      6       0.74Mid                            +------------+--------+--------+----+-----------+--------+--------+---+ Lower Pole  24      7       0.73Lower Pole                     +------------+--------+--------+----+-----------+--------+--------+---+ Hilar       35      7       0.80Hilar                          +------------+--------+--------+----+-----------+--------+--------+---+ +------------------+----+------------------++ Right Kidney          Left Kidney        +------------------+----+------------------++ RAR                   RAR                +------------------+----+------------------++ RAR (manual)          RAR (manual)       +------------------+----+------------------++ Cortex                Cortex             +------------------+----+------------------++ Cortex thickness      Corex thickness    +------------------+----+------------------++ Kidney length (cm)9.10Kidney length (cm) +------------------+----+------------------++   Summary: Renal:  Right: Normal size right kidney. Normal right Resisitive Index. Left:  Due to patient body habitus, rib shadow, and bowel gas,        unable to visualize the left kidney.  *See  table(s) above for measurements and observations.     Preliminary  Subjective: Eager to go home  Discharge Exam: Vitals:   07/06/19 0737 07/06/19 1117  BP: (!) 163/81 (!) 157/74  Pulse: 63 64  Resp: 15 19  Temp: 98.2 F (36.8 C) 98.1 F (36.7 C)  SpO2: 96% 96%   Vitals:   07/06/19 0500 07/06/19 0535 07/06/19 0737 07/06/19 1117  BP: (!) 150/80 131/79 (!) 163/81 (!) 157/74  Pulse: 66 70 63 64  Resp: 14 18 15 19   Temp: 98 F (36.7 C)  98.2 F (36.8 C) 98.1 F (36.7 C)  TempSrc: Oral  Oral Oral  SpO2: 97% 96% 96% 96%  Weight: 119 kg     Height:        General: Pt is alert, awake, not in acute distress Cardiovascular: RRR, S1/S2 +, no rubs, no gallops Respiratory: CTA bilaterally, no wheezing, no rhonchi Abdominal: Soft, NT, ND, bowel sounds + Extremities: no edema, no cyanosis   The results of significant diagnostics from this hospitalization (including imaging, microbiology, ancillary and laboratory) are listed below for reference.     Microbiology: Recent Results (from the past 240 hour(s))  SARS CORONAVIRUS 2 (TAT 6-24 HRS) Nasopharyngeal Nasopharyngeal Swab     Status: None   Collection Time: 07/01/19  2:24 PM   Specimen: Nasopharyngeal Swab  Result Value Ref Range Status   SARS Coronavirus 2 NEGATIVE NEGATIVE Final    Comment: (NOTE) SARS-CoV-2 target nucleic acids are NOT DETECTED. The SARS-CoV-2 RNA is generally detectable in upper and lower respiratory specimens during the acute phase of infection. Negative results do not preclude SARS-CoV-2 infection, do not rule out co-infections with other pathogens, and should not be used as the sole basis for treatment or other patient management decisions. Negative results must be combined with clinical observations, patient history, and epidemiological information. The expected result is Negative. Fact Sheet for Patients: SugarRoll.be Fact Sheet for Healthcare  Providers: https://www.woods-mathews.com/ This test is not yet approved or cleared by the [place] FDA and  has been authorized for detection and/or diagnosis of SARS-CoV-2 by FDA under an Emergency Use Authorization (EUA). This EUA will remain  in effect (meaning this test can be used) for the duration of the COVID-19 declaration under Section 56 4(b)(1) of the Act, 21 U.S.C. section 360bbb-3(b)(1), unless the authorization is terminated or revoked sooner. Performed at [facility], [place] [address] [place], [place] [address]   SARS Coronavirus 2 by RT PCR (hospital order, performed in [facility] hospital lab) Nasopharyngeal Nasopharyngeal Swab     Status: None   Collection Time: 07/02/19 10:34 PM   Specimen: Nasopharyngeal Swab  Result Value Ref Range Status   SARS Coronavirus 2 NEGATIVE NEGATIVE Final    Comment: (NOTE) SARS-CoV-2 target nucleic acids are NOT DETECTED. The SARS-CoV-2 RNA is generally detectable in upper and lower respiratory specimens during the acute phase of infection. The lowest concentration of SARS-CoV-2 viral copies this assay can detect is 250 copies / mL. A negative result does not preclude SARS-CoV-2 infection and should not be used as the sole basis for treatment or other patient management decisions.  A negative result may occur with improper specimen collection / handling, submission of specimen other than nasopharyngeal swab, presence of viral mutation(s) within the areas targeted by this assay, and inadequate number of viral copies (<250 copies / mL). A negative result must be combined with clinical observations, patient history, and epidemiological information. Fact Sheet for Patients:   StrictlyIdeas.no Fact Sheet for Healthcare Providers: BankingDealers.co.za This test is not yet  approved or cleared  by the [place] and has been authorized for detection and/or diagnosis  of SARS-CoV-2 by FDA under an Emergency Use Authorization (EUA).  This EUA will remain in effect (meaning this test can be used) for the duration of the COVID-19 declaration under Section 564(b)(1) of the Act, 21 U.S.C. section 360bbb-3(b)(1), unless the authorization is terminated or revoked sooner. Performed at [facility], [place] [address] [place], [place] [address]   Urine culture     Status: None   Collection Time: 07/02/19 11:48 PM   Specimen: Urine, Random  Result Value Ref Range Status   Specimen Description URINE, RANDOM  Final   Special Requests NONE  Final   Culture   Final    NO GROWTH Performed at [facility], [place] [address] [place], [place] [address]    Report Status 07/03/2019 FINAL  Final     Labs: BNP (last 3 results) No results for input(s): BNP in the last 8760 hours. Basic Metabolic Panel: Recent Labs  Lab 07/02/19 1459 07/03/19 0246 07/04/19 0330 07/05/19 0423 07/06/19 0323  NA 139 138 139 137 137  K 5.2* 5.2* 4.7 4.4 4.5  CL 107 106 106 106 102  CO2 22 24 25 24 25   GLUCOSE 172* 332* 122* 87 75  BUN 38* 38* 38* 37* 40*  CREATININE 3.93* 3.98* 4.11* 4.40* 4.63*  CALCIUM 9.0 8.4* 8.8* 8.5* 8.5*   Liver Function Tests: Recent Labs  Lab 07/02/19 2059 07/04/19 0330 07/05/19 0423 07/06/19 0323  AST 17 14* 12* 17  ALT 15 15 12 12   ALKPHOS 144* 132* 127* 117  BILITOT 0.5 0.8 0.2* 1.0  PROT 6.6 5.8* 5.9* 6.1*  ALBUMIN 3.1* 2.8* 2.9* 2.8*   Recent Labs  Lab 07/02/19 2059  LIPASE 38   No results for input(s): AMMONIA in the last 168 hours. CBC: Recent Labs  Lab 07/02/19 1301 07/02/19 1459 07/04/19 0330 07/06/19 0323  WBC  --  12.3* 11.0* 12.5*  HGB 11.6* 10.3* 9.8* 10.0*  HCT 34.0* 31.5* 29.4* 29.6*  MCV  --  82.2 82.6 82.5  PLT  --  283 297 314   Cardiac Enzymes: No results for input(s): CKTOTAL, CKMB, CKMBINDEX, TROPONINI in the last 168 hours. BNP: Invalid input(s): POCBNP CBG: Recent Labs  Lab 07/05/19 1551  07/05/19 2131 07/06/19 0616 07/06/19 0653 07/06/19 1120  GLUCAP 119* 115* 66* 113* 109*   D-Dimer No results for input(s): DDIMER in the last 72 hours. Hgb A1c No results for input(s): HGBA1C in the last 72 hours. Lipid Profile No results for input(s): CHOL, HDL, LDLCALC, TRIG, CHOLHDL, LDLDIRECT in the last 72 hours. Thyroid function studies No results for input(s): TSH, T4TOTAL, T3FREE, THYROIDAB in the last 72 hours.  Invalid input(s): FREET3 Anemia work up No results for input(s): VITAMINB12, FOLATE, FERRITIN, TIBC, IRON, RETICCTPCT in the last 72 hours. Urinalysis    Component Value Date/Time   COLORURINE YELLOW 07/02/2019 2350   APPEARANCEUR CLEAR 07/02/2019 2350   LABSPEC 1.014 07/02/2019 2350   PHURINE 6.0 07/02/2019 2350   GLUCOSEU 150 (A) 07/02/2019 2350   HGBUR NEGATIVE 07/02/2019 2350   BILIRUBINUR NEGATIVE 07/02/2019 2350   KETONESUR NEGATIVE 07/02/2019 2350   PROTEINUR >=300 (A) 07/02/2019 2350   UROBILINOGEN 0.2 10/22/2013 1757   NITRITE NEGATIVE 07/02/2019 2350   LEUKOCYTESUR TRACE (A) 07/02/2019 2350   Sepsis Labs Invalid input(s): PROCALCITONIN,  WBC,  LACTICIDVEN Microbiology Recent Results (from the past 240 hour(s))  SARS CORONAVIRUS  2 (TAT 6-24 HRS) Nasopharyngeal Nasopharyngeal Swab     Status: None   Collection Time: 07/01/19  2:24 PM   Specimen: Nasopharyngeal Swab  Result Value Ref Range Status   SARS Coronavirus 2 NEGATIVE NEGATIVE Final    Comment: (NOTE) SARS-CoV-2 target nucleic acids are NOT DETECTED. The SARS-CoV-2 RNA is generally detectable in upper and lower respiratory specimens during the acute phase of infection. Negative results do not preclude SARS-CoV-2 infection, do not rule out co-infections with other pathogens, and should not be used as the sole basis for treatment or other patient management decisions. Negative results must be combined with clinical observations, patient history, and epidemiological information. The  expected result is Negative. Fact Sheet for Patients: SugarRoll.be Fact Sheet for Healthcare Providers: https://www.woods-mathews.com/ This test is not yet approved or cleared by the [place] FDA and  has been authorized for detection and/or diagnosis of SARS-CoV-2 by FDA under an Emergency Use Authorization (EUA). This EUA will remain  in effect (meaning this test can be used) for the duration of the COVID-19 declaration under Section 56 4(b)(1) of the Act, 21 U.S.C. section 360bbb-3(b)(1), unless the authorization is terminated or revoked sooner. Performed at [facility], [place] [address] [place], [place] [address]   SARS Coronavirus 2 by RT PCR (hospital order, performed in [facility] hospital lab) Nasopharyngeal Nasopharyngeal Swab     Status: None   Collection Time: 07/02/19 10:34 PM   Specimen: Nasopharyngeal Swab  Result Value Ref Range Status   SARS Coronavirus 2 NEGATIVE NEGATIVE Final    Comment: (NOTE) SARS-CoV-2 target nucleic acids are NOT DETECTED. The SARS-CoV-2 RNA is generally detectable in upper and lower respiratory specimens during the acute phase of infection. The lowest concentration of SARS-CoV-2 viral copies this assay can detect is 250 copies / mL. A negative result does not preclude SARS-CoV-2 infection and should not be used as the sole basis for treatment or other patient management decisions.  A negative result may occur with improper specimen collection / handling, submission of specimen other than nasopharyngeal swab, presence of viral mutation(s) within the areas targeted by this assay, and inadequate number of viral copies (<250 copies / mL). A negative result must be combined with clinical observations, patient history, and epidemiological information. Fact Sheet for Patients:   StrictlyIdeas.no Fact Sheet for Healthcare  Providers: BankingDealers.co.za This test is not yet approved or cleared  by the [place] FDA and has been authorized for detection and/or diagnosis of SARS-CoV-2 by FDA under an Emergency Use Authorization (EUA).  This EUA will remain in effect (meaning this test can be used) for the duration of the COVID-19 declaration under Section 564(b)(1) of the Act, 21 U.S.C. section 360bbb-3(b)(1), unless the authorization is terminated or revoked sooner. Performed at [facility], [place] [address] [place], [place] [address]   Urine culture     Status: None   Collection Time: 07/02/19 11:48 PM   Specimen: Urine, Random  Result Value Ref Range Status   Specimen Description URINE, RANDOM  Final   Special Requests NONE  Final   Culture   Final    NO GROWTH Performed at [facility], [place] [address], [place], [place] [address]    Report Status 07/03/2019 FINAL  Final   Time spent: 30 min  SIGNED:   Marylu Lund, MD  [facility] 07/06/2019, 2:03 PM  If 7PM-7AM, please contact night-coverage

## 2019-07-15 ENCOUNTER — Other Ambulatory Visit: Payer: Self-pay

## 2019-07-16 ENCOUNTER — Other Ambulatory Visit: Payer: Self-pay | Admitting: Physician Assistant

## 2019-07-16 ENCOUNTER — Other Ambulatory Visit: Payer: Self-pay | Admitting: Podiatry

## 2019-07-22 ENCOUNTER — Other Ambulatory Visit: Payer: Self-pay

## 2019-07-22 ENCOUNTER — Other Ambulatory Visit (HOSPITAL_COMMUNITY)
Admission: RE | Admit: 2019-07-22 | Discharge: 2019-07-22 | Disposition: A | Discharge status: Home / Self Care | Payer: Medicaid Other | Source: Ambulatory Visit | Attending: Vascular Surgery | Admitting: Vascular Surgery

## 2019-07-22 ENCOUNTER — Encounter (HOSPITAL_COMMUNITY): Payer: Self-pay | Admitting: Vascular Surgery

## 2019-07-22 DIAGNOSIS — Z6841 Body Mass Index (BMI) 40.0 and over, adult: Secondary | ICD-10-CM | POA: Diagnosis not present

## 2019-07-22 DIAGNOSIS — Y832 Surgical operation with anastomosis, bypass or graft as the cause of abnormal reaction of the patient, or of later complication, without mention of misadventure at the time of the procedure: Secondary | ICD-10-CM | POA: Diagnosis not present

## 2019-07-22 DIAGNOSIS — Z79891 Long term (current) use of opiate analgesic: Secondary | ICD-10-CM | POA: Diagnosis not present

## 2019-07-22 DIAGNOSIS — T82868A Thrombosis of vascular prosthetic devices, implants and grafts, initial encounter: Secondary | ICD-10-CM | POA: Diagnosis not present

## 2019-07-22 DIAGNOSIS — Z79899 Other long term (current) drug therapy: Secondary | ICD-10-CM | POA: Diagnosis not present

## 2019-07-22 DIAGNOSIS — Z20822 Contact with and (suspected) exposure to covid-19: Secondary | ICD-10-CM | POA: Diagnosis not present

## 2019-07-22 DIAGNOSIS — J45909 Unspecified asthma, uncomplicated: Secondary | ICD-10-CM | POA: Diagnosis not present

## 2019-07-22 DIAGNOSIS — Z888 Allergy status to other drugs, medicaments and biological substances status: Secondary | ICD-10-CM | POA: Diagnosis not present

## 2019-07-22 DIAGNOSIS — K219 Gastro-esophageal reflux disease without esophagitis: Secondary | ICD-10-CM | POA: Diagnosis not present

## 2019-07-22 DIAGNOSIS — M160 Bilateral primary osteoarthritis of hip: Secondary | ICD-10-CM | POA: Diagnosis not present

## 2019-07-22 DIAGNOSIS — E1122 Type 2 diabetes mellitus with diabetic chronic kidney disease: Secondary | ICD-10-CM | POA: Diagnosis not present

## 2019-07-22 DIAGNOSIS — Z885 Allergy status to narcotic agent status: Secondary | ICD-10-CM | POA: Diagnosis not present

## 2019-07-22 DIAGNOSIS — N189 Chronic kidney disease, unspecified: Secondary | ICD-10-CM | POA: Diagnosis present

## 2019-07-22 DIAGNOSIS — I129 Hypertensive chronic kidney disease with stage 1 through stage 4 chronic kidney disease, or unspecified chronic kidney disease: Secondary | ICD-10-CM | POA: Diagnosis not present

## 2019-07-22 DIAGNOSIS — N184 Chronic kidney disease, stage 4 (severe): Secondary | ICD-10-CM | POA: Diagnosis not present

## 2019-07-22 DIAGNOSIS — Z794 Long term (current) use of insulin: Secondary | ICD-10-CM | POA: Diagnosis not present

## 2019-07-22 LAB — SARS CORONAVIRUS 2 (TAT 6-24 HRS): SARS Coronavirus 2: NEGATIVE

## 2019-07-22 NOTE — Progress Notes (Signed)
Anesthesia Chart Review: SAME DAY WORK-UP   Case: [id number] Date/Time: 07/23/19 0937   Procedure: LEFT ARM ARTERIOVENOUS (AV) FISTULA CREATION VERSES ARTERIOVENOUS GRAFT (Left )   Anesthesia type: Monitor Anesthesia Care   Pre-op diagnosis: CHRONIC KIDNEY DISEASE STAGE 4   Location: [facility] OR ROOM 16 / [place] OR   Surgeons: Waynetta Sandy, MD      DISCUSSION: Patient is a 59 year old female scheduled for the above procedure. She is s/p left arm brachial-basilic AVF 02/21/60, which failed and the above surgery recommended and initially scheduled for 07/02/19, but when she arrived for surgery her BP was elevated at 267/109, and she was sent to the ED and admitted 07/02/19-07/06/19 for hypertensive urgency with chest pain (felt related to hypertensive urgency, troponin's negative, negative stress test 07/2018).  Ultimately BP improved with combination of hydralazine, Lopressor, clonidine, and Imdur.  BP 157/74 discharge, and advised to follow-up with nephrology and vascular surgery. She is not yet on hemodialysis.  History includes never smoker, GERD, HTN, Hepatitis C, hypercholesterolemia, migraines, anemia, PUD, esophageal spasms, DM2, CKD, hysterectomy. Unremarkable cardiac testing for syncope in 2020.   07/22/19 presurgical COVID-19 is pending. She is a same day work-up, so she will get labs and anesthesia evaluation on the day of surgery.    VS: As of 07/06/19, WT 119 KG, BP 157/74, HR 64.    PROVIDERS: [name], MD is PCP  Harrie Jeans, MD is nephrologist Revenkar, Sunny Schlein, MD is cardiologist. Last evaluation 07/10/18 for HTN and syncope. She had a non-ischemic stress test, uneventful cardiac monitor, and normal LVEF without significant valvular abnormality noted by echo.    LABS: She is for ISTAT on arrival. Results from 07/06/19 include glucose 75, Cr 4.63, H/H 10.0/29.6, PLT 314. A1c 6.9% 07/03/19.   IMAGES: CT Head 07/02/19: IMPRESSION: Negative noncontrast head CT.  CXR  07/02/19: FINDINGS: There is no evidence of acute infiltrate, pleural effusion or pneumothorax. The heart size and mediastinal contours are within normal limits. The visualized skeletal structures are unremarkable. IMPRESSION: No active cardiopulmonary disease.   EKG: EKG 07/05/19: Normal sinus rhythm T wave abnormality, consider lateral ischemia Abnormal ECG lateral T wave changes new since 07/02/19 Confirmed by Jenkins Rouge [phone]) on 07/05/2019 11:50:14 PM - (Known history of inferolateral T wave abnormality, negative T waves in V3-6, I, aVL, II, avF on 08/13/16 tracing and negative T wave in V6, I, aVL on 10/31/18 tracing)   CV: Renal Artery [place] 07/06/19: Summary:   Right: Normal size right kidney. Normal right Resisitive Index.  Left: Due to patient body habitus, rib shadow, and bowel gas,  unable to visualize the left kidney.    Carotid US 09/15/18 ([place]; report found under [facility]/PACS) IMPRESSION: Color duplex indicates minimal heterogeneous and calcified plaque, with no hemodynamically significant stenosis by duplex criteria in the extracranial cerebrovascular circulation   Cardiac event monitor 07/18/2018 to 08/16/2018: Indication: Syncope and collapse Ordering physician: Jenean Lindau, MD Baseline rhythm: Sinus Atrial arrhythmia: None significant Ventricular arrhythmia: None significant Conduction abnormality: None significant Symptoms: Multiple symptoms of chest pain and shortness of breath amongst others Conclusion: Monitoring was within normal limits. Symptoms did not correlate with any significant findings on the monitor. Interpreting cardiologist: Jenean Lindau, MD   Echo 08/07/18: IMPRESSIONS   1. The left ventricle has normal systolic function with an ejection  fraction of 60-65%. The cavity size was normal. There is mildly increased  left ventricular wall thickness. Left ventricular diastolic Doppler  parameters  are consistent  with impaired  relaxation.  2. The right ventricle has normal systolic function. The cavity was  normal. There is no increase in right ventricular wall thickness.    Nuclear stress test 07/15/18:  Nuclear stress EF: 59%.  The left ventricular ejection fraction is normal (55-65%).  Blood pressure demonstrated a normal response to exercise.  There was no ST segment deviation noted during stress.  This is a low risk study.  No ischemia, no MI, normal EF.   Normal coronaries by 07/27/99 cardiac cath at [facility]. According to 08/28/13 note by Martinique, Peter, MD, "Records received from [facility]. Patient had cardiac caths there in May 2009, Jan 2011 and February 2012. These showed tapering of the distal LAD but no significant CAD. EF normal. "    Past Medical History:  Diagnosis Date  . Anemia   . Anxiety   . Arthritis    "hips" (08/28/2013)  . Asthma   . Chronic kidney disease   . Chronic lower back pain    10-27-13 not a problem at this time  . Depression   . Esophageal spasm   . Frequent UTI   . GERD (gastroesophageal reflux disease)   . Hepatitis C    genotype 1B  . High cholesterol   . Hypertension   . Migraine    "monthly" (08/28/2013)  . Pneumonia    "several times"  . Type II diabetes mellitus ([place])   . Ulcer    hx gastric ulcers    Past Surgical History:  Procedure Laterality Date  . ABDOMINAL HYSTERECTOMY    . APPENDECTOMY    . AV FISTULA PLACEMENT Left 05/19/2019   Procedure: LEFT ARM Brachiocephalic ARTERIOVENOUS (AV) FISTULA CREATION;  Surgeon: Waynetta Sandy, MD;  Location: [facility];  Service: Vascular;  Laterality: Left;  . BALLOON DILATION N/A 12/29/2012   Procedure: BALLOON DILATION;  Surgeon: Inda Castle, MD;  Location: [facility];  Service: Endoscopy;  Laterality: N/A;  . BALLOON DILATION N/A 06/25/2013   Procedure: BALLOON DILATION;  Surgeon: Inda Castle, MD;  Location: [facility];  Service: Endoscopy;   Laterality: N/A;  . BOTOX INJECTION  12/29/2012   Procedure: BOTOX INJECTION;  Surgeon: Inda Castle, MD;  Location: [facility];  Service: Endoscopy;;  . BOTOX INJECTION N/A 06/25/2013   Procedure: BOTOX INJECTION;  Surgeon: Inda Castle, MD;  Location: [facility]OS[facility];  Service: Endoscopy;  Laterality: N/A;  . CARDIAC CATHETERIZATION  X 2   /notes 08/28/2013  . CARPAL TUNNEL RELEASE Right   . CHOLECYSTECTOMY    . COLONOSCOPY  04/10/2012   Normal   . ESOPHAGEAL MANOMETRY N/A 12/26/2015   Procedure: ESOPHAGEAL MANOMETRY (EM);  Surgeon: Doran Stabler, MD;  Location: [facility] ENDOS[facility];  Service: Gastroenterology;  Laterality: N/A;  . ESOPHAGOGASTRODUODENOSCOPY N/A 12/29/2012   Procedure: ESOPHAGOGASTRODUODENOSCOPY (EGD);  Surgeon: Inda Castle, MD;  Location: [facility];  Service: Endoscopy;  Laterality: N/A;  . ESOPHAGOGASTRODUODENOSCOPY N/A 06/25/2013   Procedure: ESOPHAGOGASTRODUODENOSCOPY (EGD);  Surgeon: Inda Castle, MD;  Location: [facility];  Service: Endoscopy;  Laterality: N/A;  . ESOPHAGOGASTRODUODENOSCOPY N/A 11/03/2013   Procedure: ESOPHAGOGASTRODUODENOSCOPY (EGD);  Surgeon: Inda Castle, MD;  Location: [facility];  Service: Endoscopy;  Laterality: N/A;  Botox - ballon dilation  . ESOPHAGOGASTRODUODENOSCOPY N/A 08/14/2016   Procedure: ESOPHAGOGASTRODUODENOSCOPY (EGD);  Surgeon: Milus Banister, MD;  Location: [facility];  Service: Endoscopy;  Laterality: N/A;  . FOOT FRACTURE SURGERY Right   . HEEL SPUR EXCISION Right   .  TONSILLECTOMY    . TUBAL LIGATION      MEDICATIONS: No current facility-administered medications for this encounter.   Marland Kitchen acetaminophen (TYLENOL) 500 MG tablet  . albuterol (PROAIR HFA) 108 (90 Base) MCG/ACT inhaler  . ammonium lactate (LAC-HYDRIN) 12 % lotion  . butalbital-acetaminophen-caffeine (FIORICET) 50-325-40 MG tablet  . calcitRIOL (ROCALTROL) 0.25 MCG capsule  . Carboxymethylcellul-Glycerin (LUBRICATING EYE DROPS OP)  .  cloNIDine (CATAPRES) 0.2 MG tablet  . cyclobenzaprine (FLEXERIL) 10 MG tablet  . gabapentin (NEURONTIN) 300 MG capsule  . glimepiride (AMARYL) 4 MG tablet  . HUMULIN N 100 UNIT/ML injection  . hydrALAZINE (APRESOLINE) 50 MG tablet  . isosorbide mononitrate (IMDUR) 30 MG 24 hr tablet  . labetalol (NORMODYNE) 300 MG tablet  . lidocaine (LIDODERM) 5 %  . nitroGLYCERIN (NITROSTAT) 0.4 MG SL tablet  . omeprazole (PRILOSEC) 20 MG capsule  . sodium bicarbonate 650 MG tablet  . sodium zirconium cyclosilicate (LOKELMA) 10 g PACK packet  . traMADol (ULTRAM) 50 MG tablet   . 0.9 %  sodium chloride infusion     Myra Gianotti, PA-C Surgical Short Stay/Anesthesiology [facility] Phone [phone] [facility] Phone [phone] 07/22/2019 2:32 PM

## 2019-07-22 NOTE — Anesthesia Preprocedure Evaluation (Addendum)
Anesthesia Evaluation  Patient identified by MRN, date of birth, ID band Patient awake    Reviewed: Allergy & Precautions, NPO status , Patient's Chart, lab work & pertinent test results, reviewed documented beta blocker date and time   Airway Mallampati: II  TM Distance: >3 FB Neck ROM: Full    Dental  (+) Teeth Intact, Dental Advisory Given   Pulmonary asthma ,    Pulmonary exam normal breath sounds clear to auscultation       Cardiovascular hypertension, Pt. on home beta blockers and Pt. on medications Normal cardiovascular exam Rhythm:Regular Rate:Normal     Neuro/Psych  Headaches, PSYCHIATRIC DISORDERS Anxiety Depression  Neuromuscular disease    GI/Hepatic PUD, GERD  Medicated,(+) Hepatitis - (Treated), C  Endo/Other  diabetes, Type 2, Insulin Dependent, Oral Hypoglycemic AgentsMorbid obesity  Renal/GU ESRFRenal disease     Musculoskeletal  (+) Arthritis ,   Abdominal   Peds  Hematology  (+) Blood dyscrasia, anemia ,   Anesthesia Other Findings Day of surgery medications reviewed with the patient.  Reproductive/Obstetrics                            Anesthesia Physical Anesthesia Plan  ASA: III  Anesthesia Plan: MAC   Post-op Pain Management:    Induction: Intravenous  PONV Risk Score and Plan: 2 and Propofol infusion and Treatment may vary due to age or medical condition  Airway Management Planned: Nasal Cannula and Natural Airway  Additional Equipment:   Intra-op Plan:   Post-operative Plan:   Informed Consent: I have reviewed the patients History and Physical, chart, labs and discussed the procedure including the risks, benefits and alternatives for the proposed anesthesia with the patient or authorized representative who has indicated his/her understanding and acceptance.     Dental advisory given  Plan Discussed with: CRNA and Anesthesiologist  Anesthesia Plan  Comments: (PAT note written 07/22/2019 by Myra Gianotti, PA-C. )       Anesthesia Quick Evaluation

## 2019-07-22 NOTE — Progress Notes (Signed)
Pt denies any acute cardiopulmonary issues. Pt stated that she is under the care of Dr. Lennox Pippins, Cardiology and Dr. Spero Curb, PCP. Pt made aware to stop taking  vitamins, fish oil and herbal medications. Do not take any NSAIDs ie: Ibuprofen, Advil, Naproxen (Aleve), Motrin, BC and Goody Powder. Pt made aware to hold Glimepiride at HS and DOS. Pt made aware to talk 50% of Humulin N insulin at HS. Pt made aware to take 50% of Humulin N insulin DOS if CBG is > 70. Pt made aware to check CBG every 2 hours prior to arrival to hospital on DOS. Do not take insulin if CBG is < 70. Pt made aware to treat a CBG < 70 with 4 ounces of apple or cranberry juice, wait 15 minutes after intervention to recheck BG, if BG remains < 70, call Short Stay unit to speak with a nurse. Pt reminded to quarantine. Pt verbalized understanding of all pre-op instructions. PA, Anesthesiology, asked to review pt history, see note.

## 2019-07-23 ENCOUNTER — Inpatient Hospital Stay (HOSPITAL_COMMUNITY): Payer: Medicaid Other | Admitting: Vascular Surgery

## 2019-07-23 ENCOUNTER — Encounter (HOSPITAL_COMMUNITY): Payer: Self-pay | Admitting: Vascular Surgery

## 2019-07-23 ENCOUNTER — Encounter (HOSPITAL_COMMUNITY)
Admission: RE | Disposition: A | Discharge status: Home / Self Care | Payer: Self-pay | Source: Home / Self Care | Attending: Vascular Surgery

## 2019-07-23 ENCOUNTER — Ambulatory Visit (HOSPITAL_COMMUNITY)
Admission: RE | Admit: 2019-07-23 | Discharge: 2019-07-23 | Disposition: A | Discharge status: Home / Self Care | Payer: Medicaid Other | Attending: Vascular Surgery | Admitting: Vascular Surgery

## 2019-07-23 DIAGNOSIS — Z79891 Long term (current) use of opiate analgesic: Secondary | ICD-10-CM | POA: Insufficient documentation

## 2019-07-23 DIAGNOSIS — N184 Chronic kidney disease, stage 4 (severe): Secondary | ICD-10-CM | POA: Insufficient documentation

## 2019-07-23 DIAGNOSIS — Y832 Surgical operation with anastomosis, bypass or graft as the cause of abnormal reaction of the patient, or of later complication, without mention of misadventure at the time of the procedure: Secondary | ICD-10-CM | POA: Insufficient documentation

## 2019-07-23 DIAGNOSIS — N185 Chronic kidney disease, stage 5: Secondary | ICD-10-CM | POA: Diagnosis not present

## 2019-07-23 DIAGNOSIS — E1122 Type 2 diabetes mellitus with diabetic chronic kidney disease: Secondary | ICD-10-CM | POA: Insufficient documentation

## 2019-07-23 DIAGNOSIS — J45909 Unspecified asthma, uncomplicated: Secondary | ICD-10-CM | POA: Insufficient documentation

## 2019-07-23 DIAGNOSIS — Z6841 Body Mass Index (BMI) 40.0 and over, adult: Secondary | ICD-10-CM | POA: Insufficient documentation

## 2019-07-23 DIAGNOSIS — T82868A Thrombosis of vascular prosthetic devices, implants and grafts, initial encounter: Secondary | ICD-10-CM | POA: Insufficient documentation

## 2019-07-23 DIAGNOSIS — Z885 Allergy status to narcotic agent status: Secondary | ICD-10-CM | POA: Insufficient documentation

## 2019-07-23 DIAGNOSIS — K219 Gastro-esophageal reflux disease without esophagitis: Secondary | ICD-10-CM | POA: Insufficient documentation

## 2019-07-23 DIAGNOSIS — Z79899 Other long term (current) drug therapy: Secondary | ICD-10-CM | POA: Insufficient documentation

## 2019-07-23 DIAGNOSIS — I129 Hypertensive chronic kidney disease with stage 1 through stage 4 chronic kidney disease, or unspecified chronic kidney disease: Secondary | ICD-10-CM | POA: Insufficient documentation

## 2019-07-23 DIAGNOSIS — Z20822 Contact with and (suspected) exposure to covid-19: Secondary | ICD-10-CM | POA: Insufficient documentation

## 2019-07-23 DIAGNOSIS — M160 Bilateral primary osteoarthritis of hip: Secondary | ICD-10-CM | POA: Insufficient documentation

## 2019-07-23 DIAGNOSIS — Z794 Long term (current) use of insulin: Secondary | ICD-10-CM | POA: Insufficient documentation

## 2019-07-23 DIAGNOSIS — Z888 Allergy status to other drugs, medicaments and biological substances status: Secondary | ICD-10-CM | POA: Insufficient documentation

## 2019-07-23 HISTORY — PX: AV FISTULA PLACEMENT: SHX1204

## 2019-07-23 LAB — GLUCOSE, CAPILLARY
Glucose-Capillary: 206 mg/dL — ABNORMAL HIGH (ref 70–99)
Glucose-Capillary: 179 mg/dL — ABNORMAL HIGH (ref 70–99)

## 2019-07-23 LAB — POCT I-STAT, CHEM 8
BUN: 52 mg/dL — ABNORMAL HIGH (ref 6–20)
Calcium, Ion: 1.08 mmol/L — ABNORMAL LOW (ref 1.15–1.40)
Chloride: 105 mmol/L (ref 98–111)
Creatinine, Ser: 5.7 mg/dL — ABNORMAL HIGH (ref 0.44–1.00)
Glucose, Bld: 220 mg/dL — ABNORMAL HIGH (ref 70–99)
HCT: 31 % — ABNORMAL LOW (ref 36.0–46.0)
Hemoglobin: 10.5 g/dL — ABNORMAL LOW (ref 12.0–15.0)
Potassium: 4.2 mmol/L (ref 3.5–5.1)
Sodium: 138 mmol/L (ref 135–145)
TCO2: 23 mmol/L (ref 22–32)

## 2019-07-23 SURGERY — ARTERIOVENOUS (AV) FISTULA CREATION
Anesthesia: Monitor Anesthesia Care | Site: Arm Upper | Laterality: Left

## 2019-07-23 MED ORDER — FENTANYL CITRATE (PF) 100 MCG/2ML IJ SOLN: 25.0000 ug | INTRAMUSCULAR | Status: DC | PRN

## 2019-07-23 MED ORDER — LIDOCAINE-EPINEPHRINE (PF) 1 %-1:200000 IJ SOLN
INTRAMUSCULAR | Status: AC
  Filled 2019-07-23: qty 30

## 2019-07-23 MED ORDER — PROPOFOL 10 MG/ML IV BOLUS
INTRAVENOUS | Status: DC | PRN
  Administered 2019-07-23: 50 ug/kg/min via INTRAVENOUS

## 2019-07-23 MED ORDER — TRAMADOL HCL 50 MG PO TABS: 50.0000 mg | ORAL_TABLET | Freq: Four times a day (QID) | ORAL | 0 refills | Status: DC | PRN

## 2019-07-23 MED ORDER — SODIUM CHLORIDE 0.9 % IV SOLN: INTRAVENOUS | Status: DC

## 2019-07-23 MED ORDER — SODIUM CHLORIDE 0.9 % IV SOLN
INTRAVENOUS | Status: AC
  Filled 2019-07-23: qty 1.2

## 2019-07-23 MED ORDER — PROMETHAZINE HCL 25 MG/ML IJ SOLN: 6.2500 mg | INTRAMUSCULAR | Status: DC | PRN

## 2019-07-23 MED ORDER — DEXMEDETOMIDINE HCL IN NACL 200 MCG/50ML IV SOLN
INTRAVENOUS | Status: AC
  Filled 2019-07-23: qty 50

## 2019-07-23 MED ORDER — PROPOFOL 1000 MG/100ML IV EMUL
INTRAVENOUS | Status: AC
  Filled 2019-07-23: qty 100

## 2019-07-23 MED ORDER — LIDOCAINE-EPINEPHRINE (PF) 1 %-1:200000 IJ SOLN
INTRAMUSCULAR | Status: DC | PRN
  Administered 2019-07-23: 14 mL via INTRADERMAL

## 2019-07-23 MED ORDER — FENTANYL CITRATE (PF) 250 MCG/5ML IJ SOLN
INTRAMUSCULAR | Status: AC
  Filled 2019-07-23: qty 5

## 2019-07-23 MED ORDER — CHLORHEXIDINE GLUCONATE 0.12 % MT SOLN
15.0000 mL | Freq: Once | OROMUCOSAL | Status: AC
  Administered 2019-07-23: 15 mL via OROMUCOSAL
  Filled 2019-07-23: qty 15

## 2019-07-23 MED ORDER — MIDAZOLAM HCL 2 MG/2ML IJ SOLN
INTRAMUSCULAR | Status: AC
  Filled 2019-07-23: qty 2

## 2019-07-23 MED ORDER — CHLORHEXIDINE GLUCONATE 4 % EX LIQD: 60.0000 mL | Freq: Once | CUTANEOUS | Status: DC

## 2019-07-23 MED ORDER — MIDAZOLAM HCL 5 MG/5ML IJ SOLN
INTRAMUSCULAR | Status: DC | PRN
  Administered 2019-07-23 (×2): 1 mg via INTRAVENOUS

## 2019-07-23 MED ORDER — 0.9 % SODIUM CHLORIDE (POUR BTL) OPTIME
TOPICAL | Status: DC | PRN
  Administered 2019-07-23: 1000 mL

## 2019-07-23 MED ORDER — DEXMEDETOMIDINE HCL 200 MCG/2ML IV SOLN
INTRAVENOUS | Status: DC | PRN
  Administered 2019-07-23 (×2): 8 ug via INTRAVENOUS
  Administered 2019-07-23: 4 ug via INTRAVENOUS

## 2019-07-23 MED ORDER — CEFAZOLIN SODIUM-DEXTROSE 2-4 GM/100ML-% IV SOLN
2.0000 g | INTRAVENOUS | Status: AC
  Administered 2019-07-23: 2 g via INTRAVENOUS
  Filled 2019-07-23: qty 100

## 2019-07-23 MED ORDER — ACETAMINOPHEN 500 MG PO TABS
1000.0000 mg | ORAL_TABLET | Freq: Once | ORAL | Status: AC
  Administered 2019-07-23: 1000 mg via ORAL
  Filled 2019-07-23: qty 2

## 2019-07-23 MED ORDER — FENTANYL CITRATE (PF) 250 MCG/5ML IJ SOLN
INTRAMUSCULAR | Status: DC | PRN
  Administered 2019-07-23 (×2): 50 ug via INTRAVENOUS

## 2019-07-23 MED ORDER — LIDOCAINE 2% (20 MG/ML) 5 ML SYRINGE
INTRAMUSCULAR | Status: DC | PRN
  Administered 2019-07-23: 60 mg via INTRAVENOUS

## 2019-07-23 MED ORDER — PROPOFOL 10 MG/ML IV BOLUS
INTRAVENOUS | Status: AC
  Filled 2019-07-23: qty 20

## 2019-07-23 MED ORDER — ORAL CARE MOUTH RINSE: 15.0000 mL | Freq: Once | OROMUCOSAL | Status: AC

## 2019-07-23 MED ORDER — SODIUM CHLORIDE 0.9 % IV SOLN
INTRAVENOUS | Status: DC | PRN
  Administered 2019-07-23: 500 mL

## 2019-07-23 MED ORDER — HEMOSTATIC AGENTS (NO CHARGE) OPTIME
TOPICAL | Status: DC | PRN
  Administered 2019-07-23: 1 via TOPICAL

## 2019-07-23 SURGICAL SUPPLY — 29 items
ADH SKN CLS APL DERMABOND .7 (GAUZE/BANDAGES/DRESSINGS) ×1
ARMBAND PINK RESTRICT EXTREMIT (MISCELLANEOUS) ×3 IMPLANT
CANISTER SUCT 3000ML PPV (MISCELLANEOUS) ×3 IMPLANT
CLIP VESOCCLUDE MED 6/CT (CLIP) ×3 IMPLANT
CLIP VESOCCLUDE SM WIDE 6/CT (CLIP) ×3 IMPLANT
COVER PROBE W GEL 5X96 (DRAPES) IMPLANT
COVER WAND RF STERILE (DRAPES) ×3 IMPLANT
DERMABOND ADVANCED (GAUZE/BANDAGES/DRESSINGS) ×2
DERMABOND ADVANCED .7 DNX12 (GAUZE/BANDAGES/DRESSINGS) ×1 IMPLANT
ELECT REM PT RETURN 9FT ADLT (ELECTROSURGICAL) ×3
ELECTRODE REM PT RTRN 9FT ADLT (ELECTROSURGICAL) ×1 IMPLANT
GLOVE BIO SURGEON STRL SZ7.5 (GLOVE) ×3 IMPLANT
GOWN STRL REUS W/ TWL LRG LVL3 (GOWN DISPOSABLE) ×2 IMPLANT
GOWN STRL REUS W/ TWL XL LVL3 (GOWN DISPOSABLE) ×1 IMPLANT
GOWN STRL REUS W/TWL LRG LVL3 (GOWN DISPOSABLE) ×6
GOWN STRL REUS W/TWL XL LVL3 (GOWN DISPOSABLE) ×3
INSERT FOGARTY SM (MISCELLANEOUS) IMPLANT
KIT BASIN OR (CUSTOM PROCEDURE TRAY) ×3 IMPLANT
KIT TURNOVER KIT B (KITS) ×3 IMPLANT
NS IRRIG 1000ML POUR BTL (IV SOLUTION) ×3 IMPLANT
PACK CV ACCESS (CUSTOM PROCEDURE TRAY) ×3 IMPLANT
PAD ARMBOARD 7.5X6 YLW CONV (MISCELLANEOUS) ×6 IMPLANT
SUT MNCRL AB 4-0 PS2 18 (SUTURE) ×3 IMPLANT
SUT PROLENE 6 0 BV (SUTURE) ×3 IMPLANT
SUT VIC AB 3-0 SH 27 (SUTURE) ×3
SUT VIC AB 3-0 SH 27X BRD (SUTURE) ×1 IMPLANT
TOWEL GREEN STERILE (TOWEL DISPOSABLE) ×3 IMPLANT
UNDERPAD 30X36 HEAVY ABSORB (UNDERPADS AND DIAPERS) ×3 IMPLANT
WATER STERILE IRR 1000ML POUR (IV SOLUTION) ×3 IMPLANT

## 2019-07-23 NOTE — Discharge Instructions (Signed)
   [facility]  Discharge Instructions  AV Fistula or Graft Surgery for Dialysis Access  Please refer to the following instructions for your post-procedure care. Your surgeon or physician assistant will discuss any changes with you.  Activity  You may drive the day following your surgery, if you are comfortable and no longer taking prescription pain medication. Resume full activity as the soreness in your incision resolves.  Bathing/Showering  You may shower after you go home. Keep your incision dry for 48 hours. Do not soak in a bathtub, hot tub, or swim until the incision heals completely. You may not shower if you have a hemodialysis catheter.  Incision Care  Clean your incision with mild soap and water after 48 hours. Pat the area dry with a clean towel. You do not need a bandage unless otherwise instructed. Do not apply any ointments or creams to your incision. You may have skin glue on your incision. Do not peel it off. It will come off on its own in about one week. Your arm may swell a bit after surgery. To reduce swelling use pillows to elevate your arm so it is above your heart. Your doctor will tell you if you need to lightly wrap your arm with an ACE bandage.  Diet  Resume your normal diet. There are not special food restrictions following this procedure. In order to heal from your surgery, it is CRITICAL to get adequate nutrition. Your body requires vitamins, minerals, and protein. Vegetables are the best source of vitamins and minerals. Vegetables also provide the perfect balance of protein. Processed food has little nutritional value, so try to avoid this.  Medications  Resume taking all of your medications. If your incision is causing pain, you may take over-the counter pain relievers such as acetaminophen (Tylenol). If you were prescribed a stronger pain medication, please be aware these medications can cause nausea and constipation. Prevent  nausea by taking the medication with a snack or meal. Avoid constipation by drinking plenty of fluids and eating foods with high amount of fiber, such as fruits, vegetables, and grains.  Do not take Tylenol if you are taking prescription pain medications.  Follow up Your surgeon may want to see you in the office following your access surgery. If so, this will be arranged at the time of your surgery.  Please call us immediately for any of the following conditions:  . Increased pain, redness, drainage (pus) from your incision site . Fever of 101 degrees or higher . Severe or worsening pain at your incision site . Hand pain or numbness. .  Reduce your risk of vascular disease:  . Stop smoking. If you would like help, call [facility] at 1-800-QUIT-NOW (301)430-4158) or [place] at 857-568-5755  . Manage your cholesterol . Maintain a desired weight . Control your diabetes . Keep your blood pressure down  Dialysis  It will take several weeks to several months for your new dialysis access to be ready for use. Your surgeon will determine when it is okay to use it. Your nephrologist will continue to direct your dialysis. You can continue to use your Permcath until your new access is ready for use.   07/23/2019 [name] [name] [id number] 09/20/1960  Surgeon(s): Waynetta Sandy, MD  Procedure(s): Creation of 1st stage basilic vein transposition  x Do not stick fistula for 12 weeks    If you have any questions, please call the office at [phone].

## 2019-07-23 NOTE — Transfer of Care (Signed)
Immediate Anesthesia Transfer of Care Note  Patient: Simmie Davies  Procedure(s) Performed: LEFT ARM ARTERIOVENOUS (AV) FISTULA CREATION (Left Arm Upper)  Patient Location: PACU  Anesthesia Type:MAC  Level of Consciousness: awake, alert  and oriented  Airway & Oxygen Therapy: Patient Spontanous Breathing and Patient connected to nasal cannula oxygen  Post-op Assessment: Report given to RN, Post -op Vital signs reviewed and stable and Patient moving all extremities X 4  Post vital signs: Reviewed and stable  Last Vitals:  Vitals Value Taken Time  BP    Temp    Pulse 69 07/23/19 1111  Resp 16 07/23/19 1111  SpO2 98 % 07/23/19 1111  Vitals shown include unvalidated device data.  Last Pain:  Vitals:   07/23/19 0846  TempSrc:   PainSc: 0-No pain      Patients Stated Pain Goal: 3 (03/14/41 8887)  Complications: No complications documented.

## 2019-07-23 NOTE — Op Note (Signed)
    Patient name: [name] [name] MRN: [id number] DOB: [birth year] Sex: female  07/23/2019 Pre-operative Diagnosis: chronic kidney disease Post-operative diagnosis:  Same Surgeon:  Eda Paschal. [name], MD Assistant: Leontine Locket, PA Procedure Performed:  Left arm brachial artery to basilic vein fistula creation  Indications: 59 year old female with history of chronic kidney disease.  We placed basilic vein fistula below her antecubitum this unfortunately thrombosed.  We evaluated she appeared to have suitable basilic vein in the office and she is now negated for fistula versus graft creation in the left upper arm.  Findings: Basilic vein above the antecubitum was 3-1/2 mm and at completion of a strong thrill.  There is a palpable radial artery pulse the wrist confirmed with Doppler.   Procedure:  The patient was identified in the holding area and taken to the operating room where she is placed supine operative 1 MAC anesthesia was induced.  She was sterilely prepped draped in the left upper extremity usual fashion antibiotics were administered timeout was called.  We began using ultrasound we identified a basilic vein.  The area was anesthetized 1% lidocaine.  We made a transverse incision.  We dissected out of the vein we protected the nerve.  1 large branch was divided between ties.  The vein was more of orientation.  We dissected through the deep fascia the artery.  We placed a vessel loop around this.  The vein was then transected distally and tied off.  We flushed with heparinized saline spatulated and clamped it.  We clamped the artery distally proximally opened longitudinally flushed with heparinized saline by directions.  The vein was then sewn end-to-side with 6-0 Prolene suture to the artery.  Prior completion of the flushing directions.  Upon completion there was a good thrill in the vein.  There is a palpable radial artery pulse at the wrist.  Both these were confirmed with Doppler.  Satisfied we  irrigated obtain hemostasis.  We closed in layers with Vicryl and Monocryl.  Dermabond was placed at the level of the skin.  She was awakened from anesthesia having tolerated procedure without any complication.  All counts were correct at completion.  EBL: 20 cc    Yides Saidi C. [name], MD [facility] [place] [place] [place] Office: [phone] Pager: [phone]

## 2019-07-23 NOTE — Anesthesia Procedure Notes (Signed)
Procedure Name: MAC Date/Time: 07/23/2019 10:10 AM Performed by: Mariea Clonts, CRNA Pre-anesthesia Checklist: Patient identified, Emergency Drugs available, Suction available, Patient being monitored and Timeout performed Patient Re-evaluated:Patient Re-evaluated prior to induction Oxygen Delivery Method: Simple face mask and Nasal cannula

## 2019-07-23 NOTE — H&P (Signed)
   History and Physical Update  The patient was interviewed and re-examined.  The patient's previous History and Physical has been reviewed and is unchanged from recent office visit.  Blood pressure better controlled today.  We will plan for left upper extremity AV graft versus fistula creation in the OR today.  I have again discussed the risk benefits alternatives with the patient she demonstrates good understanding.  Soua Lenk C. [name], MD [facility] [place] Office: [phone] Pager: [phone]   07/23/2019, 7:19 AM

## 2019-07-23 NOTE — Anesthesia Postprocedure Evaluation (Signed)
Anesthesia Post Note  Patient: [name] [name]  Procedure(s) Performed: LEFT ARM ARTERIOVENOUS (AV) FISTULA CREATION (Left Arm Upper)     Patient location during evaluation: PACU Anesthesia Type: MAC Level of consciousness: awake and alert Pain management: pain level controlled Vital Signs Assessment: post-procedure vital signs reviewed and stable Respiratory status: spontaneous breathing, nonlabored ventilation and respiratory function stable Cardiovascular status: stable and blood pressure returned to baseline Postop Assessment: no apparent nausea or vomiting Anesthetic complications: no   No complications documented.  Last Vitals:  Vitals:   07/23/19 1145 07/23/19 1200  BP: 137/79 (!) 150/75  Pulse: 61 64  Resp: 13 16  Temp:  36.7 C  SpO2: 97% 99%    Last Pain:  Vitals:   07/23/19 1200  TempSrc:   PainSc: 0-No pain                 Catalina Gravel

## 2019-07-24 ENCOUNTER — Encounter (HOSPITAL_COMMUNITY): Payer: Self-pay | Admitting: Vascular Surgery

## 2019-08-05 NOTE — H&P (Signed)
Patient ID: [name] [name], female   DOB: [birth year], 59 y.o.   MRN: [id number]  Reason for Consult: Follow-up   Referred by [name], MD  Subjective:    Subjective    HPI:  TAYLIA [name] is a 59 y.o. female history of chronic kidney disease.  She recent underwent left for stage basilic vein fistula this is unfortunately clotted.  She is right-hand dominant with prefer to avoid any surgery in her right arm.  She does not have any left hand issues.  Wounds are well-healed although she does have some pain there.      Past Medical History:  Diagnosis Date  . Anemia   . Anxiety   . Arthritis    "hips" (08/28/2013)  . Asthma   . Chronic kidney disease   . Chronic lower back pain    10-27-13 not a problem at this time  . Depression   . Esophageal spasm   . Frequent UTI   . GERD (gastroesophageal reflux disease)   . Hepatitis C    genotype 1B  . High cholesterol   . Hypertension   . Migraine    "monthly" (08/28/2013)  . Pneumonia    "several times"  . Type II diabetes mellitus ([place])   . Ulcer    hx gastric ulcers        Family History  Problem Relation Age of Onset  . Hypertension Mother   . Alzheimer's disease Father   . Throat cancer Sister 67       died at age 28  . Colon cancer Neg Hx   . Rectal cancer Neg Hx   . Stomach cancer Neg Hx         Past Surgical History:  Procedure Laterality Date  . ABDOMINAL HYSTERECTOMY    . APPENDECTOMY    . AV FISTULA PLACEMENT Left 05/19/2019   Procedure: LEFT ARM Brachiocephalic ARTERIOVENOUS (AV) FISTULA CREATION;  Surgeon: Waynetta Sandy, MD;  Location: [facility];  Service: Vascular;  Laterality: Left;  . BALLOON DILATION N/A 12/29/2012   Procedure: BALLOON DILATION;  Surgeon: Inda Castle, MD;  Location: [facility];  Service: Endoscopy;  Laterality: N/A;  . BALLOON DILATION N/A 06/25/2013   Procedure: BALLOON DILATION;  Surgeon: Inda Castle, MD;  Location: [facility];  Service: Endoscopy;  Laterality: N/A;  . BOTOX INJECTION  12/29/2012   Procedure: BOTOX INJECTION;  Surgeon: Inda Castle, MD;  Location: [facility];  Service: Endoscopy;;  . BOTOX INJECTION N/A 06/25/2013   Procedure: BOTOX INJECTION;  Surgeon: Inda Castle, MD;  Location: [facility];  Service: Endoscopy;  Laterality: N/A;  . CARDIAC CATHETERIZATION  X 2   /notes 08/28/2013  . CARPAL TUNNEL RELEASE Right   . CHOLECYSTECTOMY    . COLONOSCOPY  04/10/2012   Normal   . ESOPHAGEAL MANOMETRY N/A 12/26/2015   Procedure: ESOPHAGEAL MANOMETRY (EM);  Surgeon: Doran Stabler, MD;  Location: [facility];  Service: Gastroenterology;  Laterality: N/A;  . ESOPHAGOGASTRODUODENOSCOPY N/A 12/29/2012   Procedure: ESOPHAGOGASTRODUODENOSCOPY (EGD);  Surgeon: Inda Castle, MD;  Location: [facility];  Service: Endoscopy;  Laterality: N/A;  . ESOPHAGOGASTRODUODENOSCOPY N/A 06/25/2013   Procedure: ESOPHAGOGASTRODUODENOSCOPY (EGD);  Surgeon: Inda Castle, MD;  Location: [facility];  Service: Endoscopy;  Laterality: N/A;  . ESOPHAGOGASTRODUODENOSCOPY N/A 11/03/2013   Procedure: ESOPHAGOGASTRODUODENOSCOPY (EGD);  Surgeon: Inda Castle, MD;  Location: [facility];  Service: Endoscopy;  Laterality: N/A;  Botox - ballon dilation  .  ESOPHAGOGASTRODUODENOSCOPY N/A 08/14/2016   Procedure: ESOPHAGOGASTRODUODENOSCOPY (EGD);  Surgeon: Milus Banister, MD;  Location: [facility];  Service: Endoscopy;  Laterality: N/A;  . FOOT FRACTURE SURGERY Right   . HEEL SPUR EXCISION Right   . TONSILLECTOMY    . TUBAL LIGATION      Short Social History:  Social History       Tobacco Use  . Smoking status: Never Smoker  . Smokeless tobacco: Never Used  Substance Use Topics  . Alcohol use: No    Allergies  Allergen Reactions  . Hydrochlorothiazide Itching  . Amlodipine     Foot swelling/chest pain   . Duloxetine     Hallucinations   . Iodinated  Diagnostic Agents     Pt is unaware of this allergy  . Lisinopril Cough  . Metformin     Unknown reaction  . Codeine Itching    No hives or rash, takes benadryl  . Dilaudid [Hydromorphone Hcl] Itching    "mild itching hrs after dilaudid given"          Current Outpatient Medications  Medication Sig Dispense Refill  . acetaminophen (TYLENOL) 500 MG tablet Take 1,000 mg by mouth every 4 (four) hours as needed for moderate pain.     Marland Kitchen albuterol (PROAIR HFA) 108 (90 Base) MCG/ACT inhaler Inhale 2 puffs into the lungs every 4 (four) hours as needed for wheezing or shortness of breath.     Marland Kitchen ammonium lactate (LAC-HYDRIN) 12 % lotion Apply 1 application topically 2 (two) times daily as needed (itchy dry skin).     . calcitRIOL (ROCALTROL) 0.25 MCG capsule Take 0.25 mcg by mouth daily.    . Carboxymethylcellul-Glycerin (LUBRICATING EYE DROPS OP) Place 1 drop into both eyes daily as needed (dry eyes).    . Cholecalciferol (VITAMIN D) 50 MCG (2000 UT) tablet Take 2,000 Units by mouth 2 (two) times a week.    . cloNIDine (CATAPRES) 0.1 MG tablet Take 0.2 mg by mouth 2 (two) times daily.     . cyclobenzaprine (FLEXERIL) 10 MG tablet Take 10 mg by mouth daily as needed for muscle spasms.    Marland Kitchen gabapentin (NEURONTIN) 300 MG capsule Take 1 capsule (300 mg total) by mouth 3 (three) times daily. (Patient taking differently: Take 300 mg by mouth 2 (two) times daily as needed (pain). ) 90 capsule 3  . glimepiride (AMARYL) 4 MG tablet Take 4 mg by mouth in the morning and at bedtime.     Marland Kitchen HUMULIN N 100 UNIT/ML injection Inject 17-22 Units into the skin See admin instructions. Inject 22 units in the morning, 17 units in the evening    . labetalol (NORMODYNE) 200 MG tablet Take 200 mg by mouth 2 (two) times daily. Takes with 300 mg to equal 500 mg twice daily    . labetalol (NORMODYNE) 300 MG tablet Take 300 mg by mouth 2 (two) times daily. Takes with 200 mg to equal 500 mg  twice daily    . lidocaine (LIDODERM) 5 % Place 1 patch onto the skin daily as needed for pain.    . Magnesium 400 MG TABS Take 400 mg by mouth daily.    . nitroGLYCERIN (NITROSTAT) 0.4 MG SL tablet Place 0.4 mg under the tongue every 5 (five) minutes as needed for chest pain.    Marland Kitchen omeprazole (PRILOSEC) 20 MG capsule Take 1 capsule (20 mg total) by mouth 2 (two) times daily before a meal. (Patient taking differently: Take 20 mg by mouth  daily as needed (ulcers). ) 28 capsule 0  . oxymetazoline (AFRIN) 0.05 % nasal spray Place 1 spray into both nostrils 2 (two) times daily as needed for congestion.    . sodium bicarbonate 650 MG tablet Take 1,300 mg by mouth 2 (two) times daily.     . sodium zirconium cyclosilicate (LOKELMA) 10 g PACK packet Take 10 g by mouth every Monday, Wednesday, and Friday.     . traMADol (ULTRAM) 50 MG tablet Take 1 tablet (50 mg total) by mouth every 6 (six) hours as needed for moderate pain. 15 tablet 0   No current facility-administered medications for this visit.    Review of Systems  Constitutional:  Constitutional negative. HENT: HENT negative.  Eyes: Eyes negative.  Respiratory: Respiratory negative.  Cardiovascular: Cardiovascular negative.  GI: Gastrointestinal negative.  Musculoskeletal: Musculoskeletal negative.  Skin: Skin negative.  Neurological: Neurological negative. Hematologic: Hematologic/lymphatic negative.  Psychiatric: Psychiatric negative.        Objective:   Objective        Vitals:   06/26/19 1342  BP: (!) 186/103  Pulse: 68  Resp: 20  SpO2: 97%  Weight: 262 lb (118.8 kg)  Height: 5\' 5"  (1.651 m)   Body mass index is 43.6 kg/m.  Physical Exam HENT:     Head: Normocephalic.     Nose: Nose normal.  Eyes:     Pupils: Pupils are equal, round, and reactive to light.  Cardiovascular:     Rate and Rhythm: Normal rate.     Pulses:          Radial pulses are 2+ on the right side and 2+ on the left  side.  Pulmonary:     Effort: Pulmonary effort is normal.  Abdominal:     General: Abdomen is flat.     Palpations: Abdomen is soft. There is no mass.  Musculoskeletal:        General: No swelling. Normal range of motion.  Skin:    General: Skin is warm and dry.     Capillary Refill: Capillary refill takes less than 2 seconds.  Neurological:     General: No focal deficit present.     Mental Status: She is alert.  Psychiatric:        Mood and Affect: Mood normal.        Behavior: Behavior normal.        Thought Content: Thought content normal.        Judgment: Judgment normal.     Data: I use ultrasound at the bedside today and identified a basilic vein above the antecubitum that is likely suitable for AV fistula creation.     Assessment/Plan:   Assessment    59 year old female with chronic kidney disease has a failed left arm basilic vein fistula and at the time we also explored the cephalic vein and this was not suitable.    Blood pressure better controlled today.  We will plan for left upper extremity AV graft versus fistula creation in the OR today.  I have again discussed the risk benefits alternatives with the patient she demonstrates good understanding.      Waynetta Sandy MD [facility]

## 2019-08-18 ENCOUNTER — Other Ambulatory Visit: Payer: Self-pay

## 2019-08-18 ENCOUNTER — Emergency Department (HOSPITAL_COMMUNITY)
Admission: EM | Admit: 2019-08-18 | Discharge: 2019-08-19 | Disposition: A | Discharge status: Home / Self Care | Payer: Medicaid Other | Attending: Emergency Medicine | Admitting: Emergency Medicine

## 2019-08-18 ENCOUNTER — Emergency Department (HOSPITAL_COMMUNITY): Payer: Medicaid Other

## 2019-08-18 ENCOUNTER — Encounter (HOSPITAL_COMMUNITY): Payer: Self-pay | Admitting: Emergency Medicine

## 2019-08-18 DIAGNOSIS — Z7984 Long term (current) use of oral hypoglycemic drugs: Secondary | ICD-10-CM | POA: Insufficient documentation

## 2019-08-18 DIAGNOSIS — R0789 Other chest pain: Secondary | ICD-10-CM | POA: Diagnosis not present

## 2019-08-18 DIAGNOSIS — N185 Chronic kidney disease, stage 5: Secondary | ICD-10-CM | POA: Insufficient documentation

## 2019-08-18 DIAGNOSIS — Z79899 Other long term (current) drug therapy: Secondary | ICD-10-CM | POA: Diagnosis not present

## 2019-08-18 DIAGNOSIS — J4521 Mild intermittent asthma with (acute) exacerbation: Secondary | ICD-10-CM | POA: Insufficient documentation

## 2019-08-18 DIAGNOSIS — E1122 Type 2 diabetes mellitus with diabetic chronic kidney disease: Secondary | ICD-10-CM | POA: Insufficient documentation

## 2019-08-18 DIAGNOSIS — I10 Essential (primary) hypertension: Secondary | ICD-10-CM

## 2019-08-18 DIAGNOSIS — I120 Hypertensive chronic kidney disease with stage 5 chronic kidney disease or end stage renal disease: Secondary | ICD-10-CM | POA: Diagnosis not present

## 2019-08-18 DIAGNOSIS — R0602 Shortness of breath: Secondary | ICD-10-CM | POA: Diagnosis present

## 2019-08-18 LAB — BASIC METABOLIC PANEL
Anion gap: 11 (ref 5–15)
BUN: 48 mg/dL — ABNORMAL HIGH (ref 6–20)
CO2: 19 mmol/L — ABNORMAL LOW (ref 22–32)
Calcium: 8.2 mg/dL — ABNORMAL LOW (ref 8.9–10.3)
Chloride: 110 mmol/L (ref 98–111)
Creatinine, Ser: 4.52 mg/dL — ABNORMAL HIGH (ref 0.44–1.00)
GFR calc Af Amer: 12 mL/min — ABNORMAL LOW (ref >60)
GFR calc non Af Amer: 10 mL/min — ABNORMAL LOW (ref >60)
Glucose, Bld: 253 mg/dL — ABNORMAL HIGH (ref 70–99)
Potassium: 5.1 mmol/L (ref 3.5–5.1)
Sodium: 140 mmol/L (ref 135–145)

## 2019-08-18 LAB — TROPONIN I (HIGH SENSITIVITY)
Troponin I (High Sensitivity): 10 ng/L (ref <18)
Troponin I (High Sensitivity): 12 ng/L (ref <18)

## 2019-08-18 LAB — CBC
HCT: 28.1 % — ABNORMAL LOW (ref 36.0–46.0)
Hemoglobin: 9.2 g/dL — ABNORMAL LOW (ref 12.0–15.0)
MCH: 27.5 pg (ref 26.0–34.0)
MCHC: 32.7 g/dL (ref 30.0–36.0)
MCV: 83.9 fL (ref 80.0–100.0)
Platelets: 249 10*3/uL (ref 150–400)
RBC: 3.35 MIL/uL — ABNORMAL LOW (ref 3.87–5.11)
RDW: 16.7 % — ABNORMAL HIGH (ref 11.5–15.5)
WBC: 11.4 10*3/uL — ABNORMAL HIGH (ref 4.0–10.5)
nRBC: 0.2 % (ref 0.0–0.2)

## 2019-08-18 MED ORDER — SODIUM CHLORIDE 0.9% FLUSH
3.0000 mL | Freq: Once | INTRAVENOUS | Status: AC
  Administered 2019-08-19: 3 mL via INTRAVENOUS

## 2019-08-18 NOTE — ED Triage Notes (Signed)
Pt c/o chest pain and shortness of breath x 1 week, also reports swelling to BLE. Hx asthma, reports her inhaler is not helping.

## 2019-08-19 LAB — BRAIN NATRIURETIC PEPTIDE: B Natriuretic Peptide: 178.5 pg/mL — ABNORMAL HIGH (ref 0.0–100.0)

## 2019-08-19 MED ORDER — FUROSEMIDE 20 MG PO TABS
80.0000 mg | ORAL_TABLET | Freq: Once | ORAL | Status: AC
  Administered 2019-08-19: 80 mg via ORAL
  Filled 2019-08-19: qty 4

## 2019-08-19 MED ORDER — FUROSEMIDE 20 MG PO TABS: 40.0000 mg | ORAL_TABLET | Freq: Once | ORAL | Status: DC

## 2019-08-19 MED ORDER — IPRATROPIUM-ALBUTEROL 0.5-2.5 (3) MG/3ML IN SOLN
3.0000 mL | RESPIRATORY_TRACT | Status: AC
  Administered 2019-08-19 (×3): 3 mL via RESPIRATORY_TRACT
  Filled 2019-08-19 (×2): qty 3

## 2019-08-19 MED ORDER — LABETALOL HCL 200 MG PO TABS
300.0000 mg | ORAL_TABLET | Freq: Once | ORAL | Status: AC
  Administered 2019-08-19: 300 mg via ORAL
  Filled 2019-08-19: qty 2

## 2019-08-19 MED ORDER — LABETALOL HCL 5 MG/ML IV SOLN
10.0000 mg | Freq: Once | INTRAVENOUS | Status: AC
  Administered 2019-08-19: 10 mg via INTRAVENOUS
  Filled 2019-08-19: qty 4

## 2019-08-19 MED ORDER — HYDRALAZINE HCL 25 MG PO TABS
25.0000 mg | ORAL_TABLET | Freq: Once | ORAL | Status: AC
  Administered 2019-08-19: 25 mg via ORAL
  Filled 2019-08-19: qty 1

## 2019-08-19 NOTE — ED Notes (Signed)
Patient is refusing VS rechecks.

## 2019-08-19 NOTE — ED Provider Notes (Signed)
[facility] EMERGENCY DEPARTMENT Provider Note   CSN: [id number] Arrival date & time: 08/18/19  1659     History Chief Complaint  Patient presents with  . Chest Pain  . Shortness of Breath    [name] [name] is a 59 y.o. female.  Patient relays 3 to 4-day history of chest tightness associated with dyspnea worse with exertion.  No fevers but does have a cough with deep inspiration.  She states that she also has a separate midline chest pain that is sharp in nature and not pleuritic.  She does state that she has lower extremity swelling is pointed out by her PCP she does not know how long it has been there.  States she has trouble sleeping because of her shortness of breath and lying flat.  States compliant with her medications.  She try to help with albuterol but it did not.   Chest Pain Associated symptoms: shortness of breath   Shortness of Breath Associated symptoms: chest pain        Past Medical History:  Diagnosis Date  . Anemia   . Anxiety   . Arthritis    "hips" (08/28/2013)  . Asthma   . Chronic kidney disease   . Chronic lower back pain    10-27-13 not a problem at this time  . Depression   . Esophageal spasm   . Frequent UTI   . GERD (gastroesophageal reflux disease)   . Hepatitis C    genotype 1B  . High cholesterol   . Hypertension   . Migraine    "monthly" (08/28/2013)  . Pneumonia    "several times"  . Type II diabetes mellitus ([place])   . Ulcer    hx gastric ulcers    Patient Active Problem List   Diagnosis Date Noted  . Hypertensive emergency 07/03/2019  . Type 2 diabetes mellitus ([place]) 07/02/2019  . CKD (chronic kidney disease), stage V ([place]) 07/02/2019  . Anemia of chronic renal failure 02/17/2019  . Blood in urine 02/17/2019  . Chronic pain 02/17/2019  . Closed fracture of coccyx ([place]) 02/17/2019  . Closed fracture of phalanx of foot 02/17/2019  . Depressive disorder 02/17/2019  . History of hepatitis C 02/17/2019  .  Hypertensive renal disease 02/17/2019  . Impingement syndrome of left shoulder region 02/17/2019  . Low back pain 02/17/2019  . Obesity 02/17/2019  . Shoulder pain 02/17/2019  . Sacroiliac joint pain 01/01/2019  . Lumbar radiculopathy 07/25/2018  . AKI (acute kidney injury) ([place]) 11/15/2016  . Iron deficiency anemia due to chronic blood loss 11/15/2016  . Vitamin D deficiency 11/15/2016  . CKD (chronic kidney disease), stage III 10/17/2016  . Hyperkalemia 10/17/2016  . Microscopic hematuria 10/17/2016  . Proteinuria 10/17/2016  . Acute gastric ulcer without hemorrhage or perforation   . Corns and callosities 08/13/2016  . GI bleed due to NSAIDs 08/13/2016  . Abdominal pain 08/13/2016  . Need for influenza vaccination 12/26/2015  . Diabetic neuropathy associated with type 2 diabetes mellitus ([place]) 12/25/2015  . Leg cramps, sleep related 12/05/2015  . Hypercholesteremia 11/09/2015  . Globus sensation 06/14/2014  . Abnormal EKG 08/29/2013  . Hypertensive urgency 08/28/2013  . Chest pain 08/28/2013  . GERD (gastroesophageal reflux disease) 07/17/2013  . Diarrhea 07/17/2013  . Abdominal pain, unspecified site 07/17/2013  . DM (diabetes mellitus), type 2 with neurological complications ([place]) 75/64/3329  . Atypical chest pain 03/09/2009  . Dysphagia 03/09/2009  . NAUSEA AND VOMITING 12/20/2008  . HEPATITIS  C, CHRONIC 03/10/2008  . Essential hypertension 03/10/2008  . BRONCHITIS, CHRONIC 03/10/2008  . Dyskinesia of esophagus 03/10/2008  . ARTHRITIS 03/10/2008    Past Surgical History:  Procedure Laterality Date  . ABDOMINAL HYSTERECTOMY    . APPENDECTOMY    . AV FISTULA PLACEMENT Left 05/19/2019   Procedure: LEFT ARM Brachiocephalic ARTERIOVENOUS (AV) FISTULA CREATION;  Surgeon: Waynetta Sandy, MD;  Location: [facility];  Service: Vascular;  Laterality: Left;  . AV FISTULA PLACEMENT Left 07/23/2019   Procedure: LEFT ARM ARTERIOVENOUS (AV) FISTULA CREATION;  Surgeon: Waynetta Sandy, MD;  Location: [facility];  Service: Vascular;  Laterality: Left;  . BALLOON DILATION N/A 12/29/2012   Procedure: BALLOON DILATION;  Surgeon: Inda Castle, MD;  Location: [facility];  Service: Endoscopy;  Laterality: N/A;  . BALLOON DILATION N/A 06/25/2013   Procedure: BALLOON DILATION;  Surgeon: Inda Castle, MD;  Location[facility];  Service: Endoscopy;  Laterality: N/A;  . BOTOX INJECTION  12/29/2012   Procedure: BOTOX INJECTION;  Surgeon: Inda Castle, MD;  Location: [facility];  Service: Endoscopy;;  . BOTOX INJECTION N/A 06/25/2013   Procedure: BOTOX INJECTION;  Surgeon: Inda Castle, MD;  Location[facility];  Service: Endoscopy;  Laterality: N/A;  . CARDIAC CATHETERIZATION  X 2   /notes 08/28/2013  . CARPAL TUNNEL RELEASE Right   . CHOLECYSTECTOMY    . COLONOSCOPY  04/10/2012   Normal   . ESOPHAGEAL MANOMETRY N/A 12/26/2015   Procedure: ESOPHAGEAL MANOMETRY (EM);  Surgeon: Doran Stabler, MD;  Location[facility];  Service: Gastroenterology;  Laterality: N/A;  . ESOPHAGOGASTRODUODENOSCOPY N/A 12/29/2012   Procedure: ESOPHAGOGASTRODUODENOSCOPY (EGD);  Surgeon: Inda Castle, MD;  Location: [facility];  Service: Endoscopy;  Laterality: N/A;  . ESOPHAGOGASTRODUODENOSCOPY N/A 06/25/2013   Procedure: ESOPHAGOGASTRODUODENOSCOPY (EGD);  Surgeon: Inda Castle, MD;  Location: [facility];  Service: Endoscopy;  Laterality: N/A;  . ESOPHAGOGASTRODUODENOSCOPY N/A 11/03/2013   Procedure: ESOPHAGOGASTRODUODENOSCOPY (EGD);  Surgeon: Inda Castle, MD;  Location: [facility];  Service: Endoscopy;  Laterality: N/A;  Botox - ballon dilation  . ESOPHAGOGASTRODUODENOSCOPY N/A 08/14/2016   Procedure: ESOPHAGOGASTRODUODENOSCOPY (EGD);  Surgeon: Milus Banister, MD;  Location: [facility];  Service: Endoscopy;  Laterality: N/A;  . FOOT FRACTURE SURGERY Right   . HEEL SPUR EXCISION Right   . TONSILLECTOMY    . TUBAL LIGATION       OB History   No  obstetric history on file.     Family History  Problem Relation Age of Onset  . Hypertension Mother   . Alzheimer's disease Father   . Throat cancer Sister 62       died at age 27  . Colon cancer Neg Hx   . Rectal cancer Neg Hx   . Stomach cancer Neg Hx     Social History   Tobacco Use  . Smoking status: Never Smoker  . Smokeless tobacco: Never Used  Vaping Use  . Vaping Use: Never used  Substance Use Topics  . Alcohol use: No  . Drug use: No    Home Medications Prior to Admission medications   Medication Sig Start Date End Date Taking? Authorizing Provider  acetaminophen (TYLENOL) 500 MG tablet Take 1,000 mg by mouth every 4 (four) hours as needed for moderate pain.    Yes [provider]  albuterol (PROAIR HFA) 108 (90 Base) MCG/ACT inhaler Inhale 2 puffs into the lungs every 4 (four) hours as needed for wheezing or shortness  of breath.    Yes [provider]  ammonium lactate (LAC-HYDRIN) 12 % lotion Apply 1 application topically 2 (two) times daily as needed (itchy dry skin).  11/27/18  Yes [provider]  calcitRIOL (ROCALTROL) 0.25 MCG capsule Take 0.25 mcg by mouth 2 (two) times daily.    Yes [provider]  Carboxymethylcellul-Glycerin (LUBRICATING EYE DROPS OP) Place 1 drop into both eyes daily as needed (dry eyes).   Yes [provider]  Cholecalciferol (VITAMIN D3) 25 MCG (1000 UT) CAPS Take 1,000 Units by mouth daily.   Yes [provider]  cloNIDine (CATAPRES) 0.2 MG tablet Take 1 tablet (0.2 mg total) by mouth 3 (three) times daily. 07/06/19 08/19/19 Yes Donne Hazel, MD  cyclobenzaprine (FLEXERIL) 10 MG tablet Take 10 mg by mouth daily as needed for muscle spasms.   Yes [provider]  furosemide (LASIX) 80 MG tablet Take 80 mg by mouth every Monday, Wednesday, and Friday. 07/17/19  Yes [provider]  gabapentin (NEURONTIN) 300 MG capsule TAKE 1 CAPSULE BY MOUTH THREE TIMES DAILY Patient  taking differently: Take 300 mg by mouth 3 (three) times daily.  07/16/19  Yes Hyatt, Max T, DPM  glimepiride (AMARYL) 4 MG tablet Take 4 mg by mouth in the morning and at bedtime.    Yes [provider]  HUMULIN N 100 UNIT/ML injection Inject 20 Units into the skin 2 (two) times daily.  06/26/18  Yes [provider]  hydrALAZINE (APRESOLINE) 50 MG tablet Take 1 tablet (50 mg total) by mouth every 8 (eight) hours. 07/06/19 08/19/19 Yes Donne Hazel, MD  isosorbide mononitrate (IMDUR) 30 MG 24 hr tablet Take 0.5 tablets (15 mg total) by mouth daily. 07/07/19 08/19/19 Yes Donne Hazel, MD  labetalol (NORMODYNE) 200 MG tablet Take 200 mg by mouth 2 (two) times daily. Take with a 300mg  tablet for a total dose of 500mg  07/23/19  Yes [provider]  labetalol (NORMODYNE) 300 MG tablet Take 1 tablet (300 mg total) by mouth 2 (two) times daily. 07/06/19 08/19/19 Yes Donne Hazel, MD  lidocaine (LIDODERM) 5 % Place 1 patch onto the skin daily as needed for pain. 12/29/18  Yes [provider]  MAGNESIUM PO Take 1 tablet by mouth daily.   Yes [provider]  nitroGLYCERIN (NITROSTAT) 0.4 MG SL tablet Place 0.4 mg under the tongue every 5 (five) minutes as needed for chest pain.   Yes [provider]  omeprazole (PRILOSEC) 20 MG capsule Take 1 capsule (20 mg total) by mouth 2 (two) times daily before a meal. Patient taking differently: Take 20 mg by mouth daily as needed (ulcers).  10/11/16  Yes Danis, Estill Cotta III, MD  sodium bicarbonate 650 MG tablet Take 1,300 mg by mouth 2 (two) times daily.  07/11/18  Yes [provider]  sodium zirconium cyclosilicate (LOKELMA) 10 g PACK packet Take 10 g by mouth daily.    Yes [provider]  traMADol (ULTRAM) 50 MG tablet Take 1 tablet (50 mg total) by mouth every 6 (six) hours as needed for moderate pain. 07/23/19  Yes Rhyne, Hulen Shouts, PA-C  butalbital-acetaminophen-caffeine (FIORICET) [phone] MG  tablet Take 1 tablet by mouth every 4 (four) hours as needed for headache. 07/06/19   Donne Hazel, MD    Allergies    Hydrochlorothiazide, Amlodipine, Duloxetine, Iodinated diagnostic agents, Lisinopril, Metformin, Codeine, and Dilaudid [hydromorphone hcl]  Review of Systems   Review of Systems  Respiratory:  Positive for shortness of breath.   Cardiovascular: Positive for chest pain.  All other systems reviewed and are negative.   Physical Exam Updated Vital Signs BP (!) 186/85   Pulse 73   Temp 98.4 F (36.9 C) (Oral)   Resp 17   SpO2 97%   Physical Exam Vitals and nursing note reviewed.  Constitutional:      Appearance: She is well-developed.  HENT:     Head: Normocephalic and atraumatic.  Cardiovascular:     Rate and Rhythm: Normal rate and regular rhythm.  Pulmonary:     Effort: Tachypnea present. No respiratory distress.     Breath sounds: No stridor. Wheezing (upper airway wheezing (sounds volitional)) present. No rhonchi.  Abdominal:     General: There is no distension or abdominal bruit.  Musculoskeletal:        General: Normal range of motion.     Cervical back: Normal range of motion.     Right lower leg: Edema present.     Left lower leg: Edema present.  Neurological:     Mental Status: She is alert.     ED Results / Procedures / Treatments   Labs (all labs ordered are listed, but only abnormal results are displayed) Labs Reviewed  BASIC METABOLIC PANEL - Abnormal; Notable for the following components:      Result Value   CO2 19 (*)    Glucose, Bld 253 (*)    BUN 48 (*)    Creatinine, Ser 4.52 (*)    Calcium 8.2 (*)    GFR calc non Af Amer 10 (*)    GFR calc Af Amer 12 (*)    All other components within normal limits  CBC - Abnormal; Notable for the following components:   WBC 11.4 (*)    RBC 3.35 (*)    Hemoglobin 9.2 (*)    HCT 28.1 (*)    RDW 16.7 (*)    All other components within normal limits  BRAIN NATRIURETIC PEPTIDE - Abnormal;  Notable for the following components:   B Natriuretic Peptide 178.5 (*)    All other components within normal limits  TROPONIN I (HIGH SENSITIVITY)  TROPONIN I (HIGH SENSITIVITY)    EKG EKG Interpretation  Date/Time:  Tuesday August 18 2019 17:10:34 EDT Ventricular Rate:  66 PR Interval:  158 QRS Duration: 76 QT Interval:  434 QTC Calculation: 454 R Axis:   42 Text Interpretation: Normal sinus rhythm Normal ECG No significant change since last tracing Confirmed by Merrily Pew [phone]) on 08/18/2019 11:01:13 PM Also confirmed by Merrily Pew [phone]), editor Hattie Perch ([id number])  on 08/19/2019 10:23:38 AM   Radiology DG Chest 2 View  Result Date: 08/18/2019 CLINICAL DATA:  Chest pain and shortness of breath. EXAM: CHEST - 2 VIEW COMPARISON:  Jul 02, 2019 FINDINGS: Mild diffusely increased interstitial lung markings are seen. There is no evidence of a pleural effusion or pneumothorax. The cardiac silhouette is mildly enlarged. The visualized skeletal structures are unremarkable. Surgical clips are seen overlying the right upper quadrant. IMPRESSION: Mild diffusely increased interstitial lung markings which may reflect mild interstitial edema. Electronically Signed   By: Virgina Norfolk M.D.   On: 08/18/2019 18:15    Procedures Procedures (including critical care time)  Medications Ordered in ED Medications  sodium chloride flush (NS) 0.9 % injection 3 mL (3 mLs Intravenous Given 08/19/19 0514)  ipratropium-albuterol (DUONEB) 0.5-2.5 (3) MG/3ML nebulizer solution 3 mL (3 mLs Nebulization Given 08/19/19 0245)  furosemide (LASIX)  tablet 80 mg (80 mg Oral Given 08/19/19 0215)  hydrALAZINE (APRESOLINE) tablet 25 mg (25 mg Oral Given 08/19/19 0332)  labetalol (NORMODYNE) tablet 300 mg (300 mg Oral Given 08/19/19 0332)  labetalol (NORMODYNE) injection 10 mg (10 mg Intravenous Given 08/19/19 0514)    ED Course  I have reviewed the triage vital signs and the nursing notes.  Pertinent labs &  imaging results that were available during my care of the patient were reviewed by me and considered in my medical decision making (see chart for details).    MDM Rules/Calculators/A&P                         Appears well. Asthma vs edema. Treat for both. Ambulate afterwards for likely discharge.  Ambulated without hypoxia or resp distress. BP improved with medications. fels better. Stable for discharge.   Final Clinical Impression(s) / ED Diagnoses Final diagnoses:  Hypertension, unspecified type  Mild intermittent asthma with exacerbation    Rx / DC Orders ED Discharge Orders    None       Yohann Curl, Corene Cornea, MD 08/20/19 [phone]

## 2019-08-19 NOTE — ED Notes (Signed)
Pt ambulated approx. 30 feet with O2 between 93 and 97%. Pt had a couple coughing spells where she had to stop to catch her breath, however O2 did not drop during these spells

## 2019-08-19 NOTE — Discharge Instructions (Addendum)
Ensure you are taking blood pressure medications as scheduled. If persistently high blood pressures then please discuss with your primary physician.

## 2019-08-27 ENCOUNTER — Other Ambulatory Visit: Payer: Self-pay | Admitting: *Deleted

## 2019-08-27 DIAGNOSIS — N186 End stage renal disease: Secondary | ICD-10-CM

## 2019-09-04 ENCOUNTER — Ambulatory Visit (HOSPITAL_COMMUNITY): Payer: Medicaid Other

## 2019-09-08 ENCOUNTER — Other Ambulatory Visit: Payer: Self-pay

## 2019-09-08 DIAGNOSIS — N186 End stage renal disease: Secondary | ICD-10-CM

## 2019-09-17 ENCOUNTER — Other Ambulatory Visit: Payer: Self-pay

## 2019-09-17 ENCOUNTER — Ambulatory Visit (INDEPENDENT_AMBULATORY_CARE_PROVIDER_SITE_OTHER): Payer: Self-pay | Admitting: Physician Assistant

## 2019-09-17 ENCOUNTER — Ambulatory Visit (HOSPITAL_COMMUNITY)
Admission: RE | Admit: 2019-09-17 | Discharge: 2019-09-17 | Disposition: A | Discharge status: Home / Self Care | Payer: Medicaid Other | Source: Ambulatory Visit | Attending: Surgery | Admitting: Surgery

## 2019-09-17 VITALS — BP 173/74 | HR 63 | Temp 98.0°F | Resp 20 | Ht 65.0 in | Wt 253.0 lb

## 2019-09-17 DIAGNOSIS — N186 End stage renal disease: Secondary | ICD-10-CM

## 2019-09-17 DIAGNOSIS — N184 Chronic kidney disease, stage 4 (severe): Secondary | ICD-10-CM

## 2019-09-17 NOTE — H&P (View-Only) (Signed)
POST OPERATIVE OFFICE NOTE    CC:  F/u for surgery  HPI:  This is a 59 y.o. 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 [name].   Pt states she does not have pain/numbness in her left hand.    The pt is not yet on dialysis.   Her nephrologist is Dr. [name].  She states that they are telling her when her fistula is mature, she most likely will go on dialysis.     Allergies  Allergen Reactions  . Hydrochlorothiazide Itching  . Amlodipine     Foot swelling/chest pain   . Duloxetine     Hallucinations   . Iodinated Diagnostic Agents     Pt is unaware of this allergy  . Lisinopril Cough  . Metformin     Unknown reaction  . Codeine Itching    No hives or rash, takes benadryl  . Dilaudid [Hydromorphone Hcl] Itching    "mild itching hrs after dilaudid given"    Current Outpatient Medications  Medication Sig Dispense Refill  . acetaminophen (TYLENOL) 500 MG tablet Take 1,000 mg by mouth every 4 (four) hours as needed for moderate pain.     Marland Kitchen albuterol (PROAIR HFA) 108 (90 Base) MCG/ACT inhaler Inhale 2 puffs into the lungs every 4 (four) hours as needed for wheezing or shortness of breath.     Marland Kitchen ammonium lactate (LAC-HYDRIN) 12 % lotion Apply 1 application topically 2 (two) times daily as needed (itchy dry skin).     . butalbital-acetaminophen-caffeine (FIORICET) 50-325-40 MG tablet Take 1 tablet by mouth every 4 (four) hours as needed for headache. 14 tablet 0  . calcitRIOL (ROCALTROL) 0.25 MCG capsule Take 0.25 mcg by mouth 2 (two) times daily.     . Carboxymethylcellul-Glycerin (LUBRICATING EYE DROPS OP) Place 1 drop into both eyes daily as needed (dry eyes).    . Cholecalciferol (VITAMIN D3) 25 MCG (1000 UT) CAPS Take 1,000 Units by mouth daily.    . cloNIDine (CATAPRES) 0.2 MG tablet Take 1 tablet (0.2 mg total) by mouth 3 (three) times daily. 90 tablet 0  . cyclobenzaprine (FLEXERIL) 10 MG tablet Take 10 mg by mouth daily as needed for muscle spasms.    .  furosemide (LASIX) 80 MG tablet Take 80 mg by mouth every Monday, Wednesday, and Friday.    . gabapentin (NEURONTIN) 300 MG capsule TAKE 1 CAPSULE BY MOUTH THREE TIMES DAILY (Patient taking differently: Take 300 mg by mouth 3 (three) times daily. ) 90 capsule 0  . glimepiride (AMARYL) 4 MG tablet Take 4 mg by mouth in the morning and at bedtime.     Marland Kitchen HUMULIN N 100 UNIT/ML injection Inject 20 Units into the skin 2 (two) times daily.     . hydrALAZINE (APRESOLINE) 50 MG tablet Take 1 tablet (50 mg total) by mouth every 8 (eight) hours. 90 tablet 0  . isosorbide mononitrate (IMDUR) 30 MG 24 hr tablet Take 0.5 tablets (15 mg total) by mouth daily. 15 tablet 0  . labetalol (NORMODYNE) 200 MG tablet Take 200 mg by mouth 2 (two) times daily. Take with a 300mg  tablet for a total dose of 500mg     . labetalol (NORMODYNE) 300 MG tablet Take 1 tablet (300 mg total) by mouth 2 (two) times daily. 60 tablet 0  . lidocaine (LIDODERM) 5 % Place 1 patch onto the skin daily as needed for pain.    Marland Kitchen MAGNESIUM PO Take 1 tablet by mouth daily.    Marland Kitchen  nitroGLYCERIN (NITROSTAT) 0.4 MG SL tablet Place 0.4 mg under the tongue every 5 (five) minutes as needed for chest pain.    Marland Kitchen omeprazole (PRILOSEC) 20 MG capsule Take 1 capsule (20 mg total) by mouth 2 (two) times daily before a meal. (Patient taking differently: Take 20 mg by mouth daily as needed (ulcers). ) 28 capsule 0  . sodium bicarbonate 650 MG tablet Take 1,300 mg by mouth 2 (two) times daily.     . sodium zirconium cyclosilicate (LOKELMA) 10 g PACK packet Take 10 g by mouth daily.     . traMADol (ULTRAM) 50 MG tablet Take 1 tablet (50 mg total) by mouth every 6 (six) hours as needed for moderate pain. 8 tablet 0   No current facility-administered medications for this visit.   Facility-Administered Medications Ordered in Other Visits  Medication Dose Route Frequency Provider Last Rate Last Admin  . 0.9 %  sodium chloride infusion   Intravenous Continuous PRN  Neldon Newport, CRNA   New Bag at 07/02/19 1304     ROS:  See HPI  Physical Exam:  Today's Vitals   09/17/19 1536  BP: (!) 173/74  Pulse: 63  Resp: 20  Temp: 98 F (36.7 C)  TempSrc: Temporal  SpO2: 98%  Weight: 253 lb (114.8 kg)  Height: 5\' 5"  (1.651 m)   Body mass index is 42.1 kg/m.   Incision:  Healed nicely Extremities:   There is a palpable left radial pulse.   Motor and sensory are in tact.  Strong left hand grip There is a thrill present    Dialysis Duplex on 09/17/2019: Diameter:  0.66cm-0.89cm Depth:  1.70cm-3.23cm   Assessment/Plan:  This is a 59 y.o. 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 [name].    -the pt does not have evidence of steal and denies any pain or numbness in her left hand.  -the fistula has matured nicely.  I discussed 2nd stage left BVT with pt.  She was upset that she has to have another surgery, but willing to proceed.  I also discussed with her that dialysis accesses do not last forever and will eventually need work or even new access.     -the pt will be scheduled for 2nd stage left BVT with Dr. [name] at her convenience and when he has surgery time available.   Leontine Locket, [facility] [phone]  Clinic MD:  Oneida Alar

## 2019-09-17 NOTE — Progress Notes (Signed)
POST OPERATIVE OFFICE NOTE    CC:  F/u for surgery  HPI:  This is a 59 y.o. 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 [name].   Pt states she does not have pain/numbness in her left hand.    The pt is not yet on dialysis.   Her nephrologist is Dr. [name].  She states that they are telling her when her fistula is mature, she most likely will go on dialysis.     Allergies  Allergen Reactions  . Hydrochlorothiazide Itching  . Amlodipine     Foot swelling/chest pain   . Duloxetine     Hallucinations   . Iodinated Diagnostic Agents     Pt is unaware of this allergy  . Lisinopril Cough  . Metformin     Unknown reaction  . Codeine Itching    No hives or rash, takes benadryl  . Dilaudid [Hydromorphone Hcl] Itching    "mild itching hrs after dilaudid given"    Current Outpatient Medications  Medication Sig Dispense Refill  . acetaminophen (TYLENOL) 500 MG tablet Take 1,000 mg by mouth every 4 (four) hours as needed for moderate pain.     Marland Kitchen albuterol (PROAIR HFA) 108 (90 Base) MCG/ACT inhaler Inhale 2 puffs into the lungs every 4 (four) hours as needed for wheezing or shortness of breath.     Marland Kitchen ammonium lactate (LAC-HYDRIN) 12 % lotion Apply 1 application topically 2 (two) times daily as needed (itchy dry skin).     . butalbital-acetaminophen-caffeine (FIORICET) 50-325-40 MG tablet Take 1 tablet by mouth every 4 (four) hours as needed for headache. 14 tablet 0  . calcitRIOL (ROCALTROL) 0.25 MCG capsule Take 0.25 mcg by mouth 2 (two) times daily.     . Carboxymethylcellul-Glycerin (LUBRICATING EYE DROPS OP) Place 1 drop into both eyes daily as needed (dry eyes).    . Cholecalciferol (VITAMIN D3) 25 MCG (1000 UT) CAPS Take 1,000 Units by mouth daily.    . cloNIDine (CATAPRES) 0.2 MG tablet Take 1 tablet (0.2 mg total) by mouth 3 (three) times daily. 90 tablet 0  . cyclobenzaprine (FLEXERIL) 10 MG tablet Take 10 mg by mouth daily as needed for muscle spasms.    .  furosemide (LASIX) 80 MG tablet Take 80 mg by mouth every Monday, Wednesday, and Friday.    . gabapentin (NEURONTIN) 300 MG capsule TAKE 1 CAPSULE BY MOUTH THREE TIMES DAILY (Patient taking differently: Take 300 mg by mouth 3 (three) times daily. ) 90 capsule 0  . glimepiride (AMARYL) 4 MG tablet Take 4 mg by mouth in the morning and at bedtime.     Marland Kitchen HUMULIN N 100 UNIT/ML injection Inject 20 Units into the skin 2 (two) times daily.     . hydrALAZINE (APRESOLINE) 50 MG tablet Take 1 tablet (50 mg total) by mouth every 8 (eight) hours. 90 tablet 0  . isosorbide mononitrate (IMDUR) 30 MG 24 hr tablet Take 0.5 tablets (15 mg total) by mouth daily. 15 tablet 0  . labetalol (NORMODYNE) 200 MG tablet Take 200 mg by mouth 2 (two) times daily. Take with a 300mg  tablet for a total dose of 500mg     . labetalol (NORMODYNE) 300 MG tablet Take 1 tablet (300 mg total) by mouth 2 (two) times daily. 60 tablet 0  . lidocaine (LIDODERM) 5 % Place 1 patch onto the skin daily as needed for pain.    Marland Kitchen MAGNESIUM PO Take 1 tablet by mouth daily.    Marland Kitchen  nitroGLYCERIN (NITROSTAT) 0.4 MG SL tablet Place 0.4 mg under the tongue every 5 (five) minutes as needed for chest pain.    Marland Kitchen omeprazole (PRILOSEC) 20 MG capsule Take 1 capsule (20 mg total) by mouth 2 (two) times daily before a meal. (Patient taking differently: Take 20 mg by mouth daily as needed (ulcers). ) 28 capsule 0  . sodium bicarbonate 650 MG tablet Take 1,300 mg by mouth 2 (two) times daily.     . sodium zirconium cyclosilicate (LOKELMA) 10 g PACK packet Take 10 g by mouth daily.     . traMADol (ULTRAM) 50 MG tablet Take 1 tablet (50 mg total) by mouth every 6 (six) hours as needed for moderate pain. 8 tablet 0   No current facility-administered medications for this visit.   Facility-Administered Medications Ordered in Other Visits  Medication Dose Route Frequency Provider Last Rate Last Admin  . 0.9 %  sodium chloride infusion   Intravenous Continuous PRN  Neldon Newport, CRNA   New Bag at 07/02/19 1304     ROS:  See HPI  Physical Exam:  Today's Vitals   09/17/19 1536  BP: (!) 173/74  Pulse: 63  Resp: 20  Temp: 98 F (36.7 C)  TempSrc: Temporal  SpO2: 98%  Weight: 253 lb (114.8 kg)  Height: 5\' 5"  (1.651 m)   Body mass index is 42.1 kg/m.   Incision:  Healed nicely Extremities:   There is a palpable left radial pulse.   Motor and sensory are in tact.  Strong left hand grip There is a thrill present    Dialysis Duplex on 09/17/2019: Diameter:  0.66cm-0.89cm Depth:  1.70cm-3.23cm   Assessment/Plan:  This is a 59 y.o. 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 [name].    -the pt does not have evidence of steal and denies any pain or numbness in her left hand.  -the fistula has matured nicely.  I discussed 2nd stage left BVT with pt.  She was upset that she has to have another surgery, but willing to proceed.  I also discussed with her that dialysis accesses do not last forever and will eventually need work or even new access.     -the pt will be scheduled for 2nd stage left BVT with Dr. [name] at her convenience and when he has surgery time available.   Leontine Locket, [facility] [phone]  Clinic MD:  Oneida Alar

## 2019-09-21 ENCOUNTER — Other Ambulatory Visit: Payer: Self-pay

## 2019-09-22 ENCOUNTER — Other Ambulatory Visit: Payer: Self-pay | Admitting: Podiatry

## 2019-09-22 ENCOUNTER — Other Ambulatory Visit: Payer: Self-pay | Admitting: Physician Assistant

## 2019-09-23 ENCOUNTER — Other Ambulatory Visit (HOSPITAL_COMMUNITY)
Admission: RE | Admit: 2019-09-23 | Discharge: 2019-09-23 | Disposition: A | Discharge status: Home / Self Care | Payer: Medicaid Other | Source: Ambulatory Visit | Attending: Vascular Surgery | Admitting: Vascular Surgery

## 2019-09-23 ENCOUNTER — Encounter (HOSPITAL_COMMUNITY): Payer: Self-pay | Admitting: Vascular Surgery

## 2019-09-23 ENCOUNTER — Other Ambulatory Visit: Payer: Self-pay

## 2019-09-23 DIAGNOSIS — Z01812 Encounter for preprocedural laboratory examination: Secondary | ICD-10-CM | POA: Diagnosis present

## 2019-09-23 DIAGNOSIS — Z20822 Contact with and (suspected) exposure to covid-19: Secondary | ICD-10-CM | POA: Insufficient documentation

## 2019-09-23 LAB — SARS CORONAVIRUS 2 (TAT 6-24 HRS): SARS Coronavirus 2: NEGATIVE

## 2019-09-23 NOTE — Progress Notes (Signed)
Pt denies any acute cardiopulmonary issues. Pt stated that she has been under stress since her father died. Pt stated that she is under the care of Dr. Lennox Pippins, Cardiology and Dr. Spero Curb, PCP. Pt made aware to stop taking  vitamins, fish oil and herbal medications. Do not take any NSAIDs ie: Ibuprofen, Advil, Naproxen (Aleve), Motrin, BC and Goody Powder. Pt made aware to hold Glimepiride at HS and DOS. Pt made aware to talk 50% of Humulin N insulin at HS. Pt made aware to take 50% of Humulin N insulin DOS if CBG is > 70. Pt made aware to check CBG every 2 hours prior to arrival to hospital on DOS. Do not take insulin if CBG is < 70. Pt made aware to treat a CBG < 70 with 4 ounces of apple or cranberry juice, wait 15 minutes after intervention to recheck BG, if BG remains < 70, call Short Stay unit to speak with a nurse. Pt reminded to quarantine. Pt verbalized understanding of all pre-op instructions.

## 2019-09-24 ENCOUNTER — Ambulatory Visit (HOSPITAL_COMMUNITY): Payer: Medicaid Other

## 2019-09-24 ENCOUNTER — Other Ambulatory Visit: Payer: Self-pay

## 2019-09-24 ENCOUNTER — Ambulatory Visit (HOSPITAL_COMMUNITY)
Admission: RE | Admit: 2019-09-24 | Discharge: 2019-09-24 | Disposition: A | Discharge status: Home / Self Care | Payer: Medicaid Other | Attending: Vascular Surgery | Admitting: Vascular Surgery

## 2019-09-24 ENCOUNTER — Encounter (HOSPITAL_COMMUNITY): Payer: Self-pay | Admitting: Vascular Surgery

## 2019-09-24 ENCOUNTER — Encounter (HOSPITAL_COMMUNITY)
Admission: RE | Disposition: A | Discharge status: Home / Self Care | Payer: Self-pay | Source: Home / Self Care | Attending: Vascular Surgery

## 2019-09-24 DIAGNOSIS — Z79899 Other long term (current) drug therapy: Secondary | ICD-10-CM | POA: Insufficient documentation

## 2019-09-24 DIAGNOSIS — Z794 Long term (current) use of insulin: Secondary | ICD-10-CM | POA: Diagnosis not present

## 2019-09-24 DIAGNOSIS — N185 Chronic kidney disease, stage 5: Secondary | ICD-10-CM | POA: Insufficient documentation

## 2019-09-24 DIAGNOSIS — Z885 Allergy status to narcotic agent status: Secondary | ICD-10-CM | POA: Insufficient documentation

## 2019-09-24 DIAGNOSIS — Z888 Allergy status to other drugs, medicaments and biological substances status: Secondary | ICD-10-CM | POA: Diagnosis not present

## 2019-09-24 HISTORY — PX: BASCILIC VEIN TRANSPOSITION: SHX5742

## 2019-09-24 LAB — GLUCOSE, CAPILLARY
Glucose-Capillary: 212 mg/dL — ABNORMAL HIGH (ref 70–99)
Glucose-Capillary: 207 mg/dL — ABNORMAL HIGH (ref 70–99)
Glucose-Capillary: 205 mg/dL — ABNORMAL HIGH (ref 70–99)

## 2019-09-24 LAB — POCT I-STAT, CHEM 8
BUN: 71 mg/dL — ABNORMAL HIGH (ref 6–20)
Calcium, Ion: 1.03 mmol/L — ABNORMAL LOW (ref 1.15–1.40)
Chloride: 100 mmol/L (ref 98–111)
Creatinine, Ser: 6.1 mg/dL — ABNORMAL HIGH (ref 0.44–1.00)
Glucose, Bld: 202 mg/dL — ABNORMAL HIGH (ref 70–99)
HCT: 31 % — ABNORMAL LOW (ref 36.0–46.0)
Hemoglobin: 10.5 g/dL — ABNORMAL LOW (ref 12.0–15.0)
Potassium: 4.6 mmol/L (ref 3.5–5.1)
Sodium: 136 mmol/L (ref 135–145)
TCO2: 26 mmol/L (ref 22–32)

## 2019-09-24 SURGERY — TRANSPOSITION, VEIN, BASILIC
Anesthesia: Monitor Anesthesia Care | Site: Arm Upper | Laterality: Left

## 2019-09-24 MED ORDER — LIDOCAINE HCL (PF) 1 % IJ SOLN
INTRAMUSCULAR | Status: AC
  Filled 2019-09-24: qty 30

## 2019-09-24 MED ORDER — FENTANYL CITRATE (PF) 250 MCG/5ML IJ SOLN
INTRAMUSCULAR | Status: DC | PRN
  Administered 2019-09-24 (×2): 50 ug via INTRAVENOUS

## 2019-09-24 MED ORDER — DEXMEDETOMIDINE (PRECEDEX) IN NS 20 MCG/5ML (4 MCG/ML) IV SYRINGE
PREFILLED_SYRINGE | INTRAVENOUS | Status: DC | PRN
  Administered 2019-09-24 (×3): 4 ug via INTRAVENOUS

## 2019-09-24 MED ORDER — OXYCODONE HCL 5 MG PO TABS: 5.0000 mg | ORAL_TABLET | Freq: Once | ORAL | Status: AC

## 2019-09-24 MED ORDER — LIDOCAINE-EPINEPHRINE (PF) 1 %-1:200000 IJ SOLN
INTRAMUSCULAR | Status: DC | PRN
  Administered 2019-09-24: 30 mL

## 2019-09-24 MED ORDER — ONDANSETRON HCL 4 MG/2ML IJ SOLN: 4.0000 mg | Freq: Once | INTRAMUSCULAR | Status: DC | PRN

## 2019-09-24 MED ORDER — OXYCODONE-ACETAMINOPHEN 5-325 MG PO TABS
ORAL_TABLET | ORAL | Status: AC
  Administered 2019-09-24: 1 via ORAL
  Filled 2019-09-24: qty 1

## 2019-09-24 MED ORDER — OXYCODONE HCL 5 MG PO TABS
ORAL_TABLET | ORAL | Status: AC
  Administered 2019-09-24: 5 mg via ORAL
  Filled 2019-09-24: qty 1

## 2019-09-24 MED ORDER — SODIUM CHLORIDE 0.9 % IV SOLN
INTRAVENOUS | Status: AC
  Filled 2019-09-24: qty 1.2

## 2019-09-24 MED ORDER — ORAL CARE MOUTH RINSE: 15.0000 mL | Freq: Once | OROMUCOSAL | Status: AC

## 2019-09-24 MED ORDER — HYDROMORPHONE HCL 1 MG/ML IJ SOLN
INTRAMUSCULAR | Status: AC
  Administered 2019-09-24: 0.5 mg via INTRAVENOUS
  Filled 2019-09-24: qty 1

## 2019-09-24 MED ORDER — OXYCODONE-ACETAMINOPHEN 5-325 MG PO TABS: 1.0000 | ORAL_TABLET | Freq: Once | ORAL | Status: AC

## 2019-09-24 MED ORDER — 0.9 % SODIUM CHLORIDE (POUR BTL) OPTIME
TOPICAL | Status: DC | PRN
  Administered 2019-09-24: 1000 mL

## 2019-09-24 MED ORDER — SODIUM CHLORIDE 0.9 % IV SOLN: INTRAVENOUS | Status: DC

## 2019-09-24 MED ORDER — HYDROMORPHONE HCL 1 MG/ML IJ SOLN
0.2500 mg | INTRAMUSCULAR | Status: DC | PRN
  Administered 2019-09-24: 0.5 mg via INTRAVENOUS

## 2019-09-24 MED ORDER — DIPHENHYDRAMINE HCL 25 MG PO CAPS
25.0000 mg | ORAL_CAPSULE | Freq: Once | ORAL | Status: DC
  Filled 2019-09-24: qty 1

## 2019-09-24 MED ORDER — CHLORHEXIDINE GLUCONATE 4 % EX LIQD: 60.0000 mL | Freq: Once | CUTANEOUS | Status: DC

## 2019-09-24 MED ORDER — DIPHENHYDRAMINE HCL 50 MG/ML IJ SOLN
INTRAMUSCULAR | Status: DC | PRN
  Administered 2019-09-24: 12.5 mg via INTRAVENOUS

## 2019-09-24 MED ORDER — MIDAZOLAM HCL 5 MG/5ML IJ SOLN
INTRAMUSCULAR | Status: DC | PRN
  Administered 2019-09-24 (×2): 1 mg via INTRAVENOUS

## 2019-09-24 MED ORDER — LIDOCAINE-EPINEPHRINE (PF) 1 %-1:200000 IJ SOLN
INTRAMUSCULAR | Status: AC
  Filled 2019-09-24: qty 30

## 2019-09-24 MED ORDER — OXYCODONE-ACETAMINOPHEN 10-325 MG PO TABS: 1.0000 | ORAL_TABLET | Freq: Four times a day (QID) | ORAL | 0 refills | Status: AC | PRN

## 2019-09-24 MED ORDER — CEFAZOLIN SODIUM-DEXTROSE 2-4 GM/100ML-% IV SOLN
2.0000 g | INTRAVENOUS | Status: AC
  Administered 2019-09-24: 2 g via INTRAVENOUS
  Filled 2019-09-24: qty 100

## 2019-09-24 MED ORDER — LIDOCAINE HCL 1 % IJ SOLN
INTRAMUSCULAR | Status: DC | PRN
  Administered 2019-09-24: 10 mL

## 2019-09-24 MED ORDER — PROPOFOL 10 MG/ML IV BOLUS
INTRAVENOUS | Status: DC | PRN
  Administered 2019-09-24: 20 mg via INTRAVENOUS
  Administered 2019-09-24: 15 mg via INTRAVENOUS

## 2019-09-24 MED ORDER — PROPOFOL 500 MG/50ML IV EMUL
INTRAVENOUS | Status: DC | PRN
  Administered 2019-09-24: 75 ug/kg/min via INTRAVENOUS
  Administered 2019-09-24: 100 ug/kg/min via INTRAVENOUS

## 2019-09-24 MED ORDER — FENTANYL CITRATE (PF) 250 MCG/5ML IJ SOLN
INTRAMUSCULAR | Status: AC
  Filled 2019-09-24: qty 5

## 2019-09-24 MED ORDER — MIDAZOLAM HCL 2 MG/2ML IJ SOLN
INTRAMUSCULAR | Status: AC
  Filled 2019-09-24: qty 2

## 2019-09-24 MED ORDER — DIPHENHYDRAMINE HCL 12.5 MG/5ML PO ELIX
ORAL_SOLUTION | ORAL | Status: AC
  Administered 2019-09-24: 25 mg
  Filled 2019-09-24: qty 10

## 2019-09-24 MED ORDER — DEXMEDETOMIDINE (PRECEDEX) IN NS 20 MCG/5ML (4 MCG/ML) IV SYRINGE: PREFILLED_SYRINGE | INTRAVENOUS | Status: DC | PRN

## 2019-09-24 MED ORDER — ALBUMIN HUMAN 5 % IV SOLN
INTRAVENOUS | Status: DC | PRN
Start: 2019-09-24 — End: 2019-09-24

## 2019-09-24 MED ORDER — ONDANSETRON HCL 4 MG/2ML IJ SOLN
INTRAMUSCULAR | Status: DC | PRN
  Administered 2019-09-24: 4 mg via INTRAVENOUS

## 2019-09-24 MED ORDER — CHLORHEXIDINE GLUCONATE 0.12 % MT SOLN
15.0000 mL | Freq: Once | OROMUCOSAL | Status: AC
  Administered 2019-09-24: 15 mL via OROMUCOSAL
  Filled 2019-09-24: qty 15

## 2019-09-24 MED ORDER — MEPERIDINE HCL 25 MG/ML IJ SOLN: 6.2500 mg | INTRAMUSCULAR | Status: DC | PRN

## 2019-09-24 MED ORDER — SODIUM CHLORIDE 0.9 % IV SOLN
INTRAVENOUS | Status: DC | PRN
  Administered 2019-09-24: 500 mL

## 2019-09-24 SURGICAL SUPPLY — 34 items
ADH SKN CLS APL DERMABOND .7 (GAUZE/BANDAGES/DRESSINGS) ×1
ARMBAND PINK RESTRICT EXTREMIT (MISCELLANEOUS) ×2 IMPLANT
CANISTER SUCT 3000ML PPV (MISCELLANEOUS) ×2 IMPLANT
CLIP VESOCCLUDE MED 24/CT (CLIP) IMPLANT
CLIP VESOCCLUDE MED 6/CT (CLIP) IMPLANT
CLIP VESOCCLUDE SM WIDE 24/CT (CLIP) IMPLANT
CLIP VESOCCLUDE SM WIDE 6/CT (CLIP) IMPLANT
COVER PROBE W GEL 5X96 (DRAPES) ×2 IMPLANT
COVER WAND RF STERILE (DRAPES) ×2 IMPLANT
DERMABOND ADVANCED (GAUZE/BANDAGES/DRESSINGS) ×1
DERMABOND ADVANCED .7 DNX12 (GAUZE/BANDAGES/DRESSINGS) ×1 IMPLANT
ELECT CAUTERY BLADE 6.4 (BLADE) ×2 IMPLANT
ELECT REM PT RETURN 9FT ADLT (ELECTROSURGICAL) ×2
ELECTRODE REM PT RTRN 9FT ADLT (ELECTROSURGICAL) ×1 IMPLANT
GLOVE BIO SURGEON STRL SZ7.5 (GLOVE) ×2 IMPLANT
GOWN STRL REUS W/ TWL LRG LVL3 (GOWN DISPOSABLE) ×2 IMPLANT
GOWN STRL REUS W/ TWL XL LVL3 (GOWN DISPOSABLE) ×1 IMPLANT
GOWN STRL REUS W/TWL LRG LVL3 (GOWN DISPOSABLE) ×4
GOWN STRL REUS W/TWL XL LVL3 (GOWN DISPOSABLE) ×2
KIT BASIN OR (CUSTOM PROCEDURE TRAY) ×2 IMPLANT
KIT TURNOVER KIT B (KITS) ×2 IMPLANT
NS IRRIG 1000ML POUR BTL (IV SOLUTION) ×2 IMPLANT
PACK CV ACCESS (CUSTOM PROCEDURE TRAY) ×2 IMPLANT
PAD ARMBOARD 7.5X6 YLW CONV (MISCELLANEOUS) ×4 IMPLANT
PENCIL BUTTON HOLSTER BLD 10FT (ELECTRODE) ×2 IMPLANT
SUT MNCRL AB 4-0 PS2 18 (SUTURE) ×2 IMPLANT
SUT PROLENE 5 0 C 1 24 (SUTURE) ×4 IMPLANT
SUT PROLENE 6 0 BV (SUTURE) ×4 IMPLANT
SUT SILK 2 0 SH (SUTURE) IMPLANT
SUT VIC AB 3-0 SH 27 (SUTURE) ×2
SUT VIC AB 3-0 SH 27X BRD (SUTURE) ×1 IMPLANT
TOWEL GREEN STERILE (TOWEL DISPOSABLE) ×2 IMPLANT
UNDERPAD 30X36 HEAVY ABSORB (UNDERPADS AND DIAPERS) ×2 IMPLANT
WATER STERILE IRR 1000ML POUR (IV SOLUTION) ×2 IMPLANT

## 2019-09-24 NOTE — Op Note (Signed)
    Patient name: [name] [name] MRN: [id number] DOB: 1960/08/14 Sex: female  09/24/2019 Pre-operative Diagnosis: ckd Post-operative diagnosis:  Same Surgeon:  Erlene Quan C. [name], MD Assistant: Risa Grill, PA Procedure Performed:  Left arm second stage basilic vein transposition av fistula  Indications: 59 year old female has undergone a failed left arm AV fistula now has a for stage basilic vein fistula is indicated for transposition due to depth.  Does have adequate flow.  Findings: Fistula self was actually quite thin in certain areas we did enter.  With Prolene suture.  There was very strong flow and after transposition had a very strong thrill confirmed with Doppler and a palpable radial artery pulse also confirmed with Doppler.   Procedure:  The patient was identified in the holding area and taken to the operating where she is placed upon operative MAC anesthesia was induced.  She was gently prepped draped in the left upper extremity usual fashion antibiotics were minister time was called.  We began with anesthetizing the left upper extremity the expected incision sites and tunnel tract with 1% lidocaine with and without epinephrine.  We reopened her previous incision identified the fistula dissected this out for several centimeters.  2 separate skip incisions were made to the axilla dissected out branches divided between clips and ties.  Unfortunately the fistula was quite thin there were 3 areas we had to repair with interrupted Prolene suture.  Ultimately we dissected out the entire thing.  We marked it for orientation.  We then clamped into the axilla and divided near the antecubitum.  We then flushed with heparinized saline and then clamped and flushed there was no leaking.  We then tunneled laterally.  We again flushed with heparinized saline.  We spatulated both ends and end-to-end with 6-0 Prolene suture.  Prior to completion of flushing directions.  Upon completion there was a very strong  thrill this was confirmed with Doppler in the axillary portion of the vein.  There is also a palpable radial artery pulse at the wrist also confirmed with Doppler.  Satisfied we obtain hemostasis irrigated all the wounds and closed in layers of Vicryl and Monocryl.  Dermabond is placed at the level of skin.  She was awakened from anesthesia having tolerated procedure without immediate complication but all counts were correct at completion.  EBL: 400 cc   Jamichael Knotts C. [name], MD [facility] [place] Office: [phone] Pager: [phone]

## 2019-09-24 NOTE — Discharge Instructions (Signed)
° °  [facility] [place] ° °Discharge Instructions ° °AV Fistula or Graft Surgery for Dialysis Access ° °Please refer to the following instructions for your post-procedure care. Your surgeon or physician assistant will discuss any changes with you. ° °Activity ° °You may drive the day following your surgery, if you are comfortable and no longer taking prescription pain medication. Resume full activity as the soreness in your incision resolves. ° °Bathing/Showering ° °You may shower after you go home. Keep your incision dry for 48 hours. Do not soak in a bathtub, hot tub, or swim until the incision heals completely. You may not shower if you have a hemodialysis catheter. ° °Incision Care ° °Clean your incision with mild soap and water after 48 hours. Pat the area dry with a clean towel. You do not need a bandage unless otherwise instructed. Do not apply any ointments or creams to your incision. You may have skin glue on your incision. Do not peel it off. It will come off on its own in about one week. Your arm may swell a bit after surgery. To reduce swelling use pillows to elevate your arm so it is above your heart. Your doctor will tell you if you need to lightly wrap your arm with an ACE bandage. ° °Diet ° °Resume your normal diet. There are not special food restrictions following this procedure. In order to heal from your surgery, it is CRITICAL to get adequate nutrition. Your body requires vitamins, minerals, and protein. Vegetables are the best source of vitamins and minerals. Vegetables also provide the perfect balance of protein. Processed food has little nutritional value, so try to avoid this. ° °Medications ° °Resume taking all of your medications. If your incision is causing pain, you may take over-the counter pain relievers such as acetaminophen (Tylenol). If you were prescribed a stronger pain medication, please be aware these medications can cause nausea and constipation. Prevent  nausea by taking the medication with a snack or meal. Avoid constipation by drinking plenty of fluids and eating foods with high amount of fiber, such as fruits, vegetables, and grains. Do not take Tylenol if you are taking prescription pain medications. ° ° ° ° °Follow up °Your surgeon may want to see you in the office following your access surgery. If so, this will be arranged at the time of your surgery. ° °Please call us immediately for any of the following conditions: ° °Increased pain, redness, drainage (pus) from your incision site °Fever of 101 degrees or higher °Severe or worsening pain at your incision site °Hand pain or numbness. ° °Reduce your risk of vascular disease: ° °Stop smoking. If you would like help, call [facility] at 1-800-QUIT-NOW (1-800-784-8669) or [place] at 336-586-4000 ° °Manage your cholesterol °Maintain a desired weight °Control your diabetes °Keep your blood pressure down ° °Dialysis ° °It will take several weeks to several months for your new dialysis access to be ready for use. Your surgeon will determine when it is OK to use it. Your nephrologist will continue to direct your dialysis. You can continue to use your Permcath until your new access is ready for use. ° °If you have any questions, please call the office at [phone]. ° °

## 2019-09-24 NOTE — Anesthesia Preprocedure Evaluation (Signed)
Anesthesia Evaluation  Patient identified by MRN, date of birth, ID band Patient awake    Reviewed: Allergy & Precautions, NPO status , Patient's Chart, lab work & pertinent test results  Airway Mallampati: I  TM Distance: >3 FB Neck ROM: Full    Dental   Pulmonary    Pulmonary exam normal        Cardiovascular hypertension, Pt. on medications Normal cardiovascular exam     Neuro/Psych Anxiety Depression    GI/Hepatic GERD  Medicated and Controlled,(+) Hepatitis -, C  Endo/Other  diabetes  Renal/GU ESRF and DialysisRenal disease     Musculoskeletal   Abdominal   Peds  Hematology   Anesthesia Other Findings   Reproductive/Obstetrics                             Anesthesia Physical Anesthesia Plan  ASA: III  Anesthesia Plan: MAC   Post-op Pain Management:    Induction: Intravenous  PONV Risk Score and Plan: 2  Airway Management Planned: Nasal Cannula  Additional Equipment:   Intra-op Plan:   Post-operative Plan: Extubation in OR  Informed Consent: I have reviewed the patients History and Physical, chart, labs and discussed the procedure including the risks, benefits and alternatives for the proposed anesthesia with the patient or authorized representative who has indicated his/her understanding and acceptance.       Plan Discussed with: CRNA and Surgeon  Anesthesia Plan Comments:         Anesthesia Quick Evaluation

## 2019-09-24 NOTE — Interval H&P Note (Signed)
History and Physical Interval Note:  09/24/2019 12:38 PM  [name] [name]  has presented today for surgery, with the diagnosis of CHRONIC KIDNEY DISEASE STAGE V.  The various methods of treatment have been discussed with the patient and family. After consideration of risks, benefits and other options for treatment, the patient has consented to  Procedure(s): [place] (Left) as a surgical intervention.  The patient's history has been reviewed, patient examined, no change in status, stable for surgery.  I have reviewed the patient's chart and labs.  Questions were answered to the patient's satisfaction.     Servando Snare

## 2019-09-24 NOTE — Transfer of Care (Signed)
Immediate Anesthesia Transfer of Care Note  Patient: [name] [name]  Procedure(s) Performed: SECOND STAGE LEFT BASCILIC VEIN TRANSPOSITION (Left Arm Upper)  Patient Location: PACU  Anesthesia Type:MAC  Level of Consciousness: lethargic and responds to stimulation  Airway & Oxygen Therapy: Patient Spontanous Breathing  Post-op Assessment: Report given to RN and Post -op Vital signs reviewed and stable  Post vital signs: Reviewed and stable  Last Vitals:  Vitals Value Taken Time  BP 185/76 09/24/19 1501  Temp    Pulse 64 09/24/19 1501  Resp 17 09/24/19 1501  SpO2 97 % 09/24/19 1501  Vitals shown include unvalidated device data.  Last Pain:  Vitals:   09/24/19 1035  TempSrc:   PainSc: 5          Complications: No complications documented.

## 2019-09-24 NOTE — Anesthesia Postprocedure Evaluation (Signed)
Anesthesia Post Note  Patient: [name] [name]  Procedure(s) Performed: SECOND STAGE LEFT BASCILIC VEIN TRANSPOSITION (Left Arm Upper)     Patient location during evaluation: PACU Anesthesia Type: MAC Level of consciousness: awake and alert Pain management: pain level controlled Vital Signs Assessment: post-procedure vital signs reviewed and stable Respiratory status: spontaneous breathing, nonlabored ventilation, respiratory function stable and patient connected to nasal cannula oxygen Cardiovascular status: stable and blood pressure returned to baseline Postop Assessment: no apparent nausea or vomiting Anesthetic complications: no   No complications documented.  Last Vitals:  Vitals:   09/24/19 1600 09/24/19 1615  BP: (!) 196/78 140/67  Pulse: 65 67  Resp: 14 15  Temp:  36.4 C  SpO2: 91% 93%    Last Pain:  Vitals:   09/24/19 1615  TempSrc:   PainSc: Asleep                 Ivette Castronova DAVID

## 2019-09-25 ENCOUNTER — Encounter (HOSPITAL_COMMUNITY): Payer: Self-pay | Admitting: Vascular Surgery

## 2019-10-09 ENCOUNTER — Ambulatory Visit (INDEPENDENT_AMBULATORY_CARE_PROVIDER_SITE_OTHER): Payer: Medicaid Other | Admitting: Physician Assistant

## 2019-10-09 ENCOUNTER — Other Ambulatory Visit: Payer: Self-pay

## 2019-10-09 VITALS — BP 133/69 | HR 71 | Temp 97.5°F | Resp 16 | Ht 65.0 in | Wt 246.0 lb

## 2019-10-09 DIAGNOSIS — N184 Chronic kidney disease, stage 4 (severe): Secondary | ICD-10-CM

## 2019-10-09 NOTE — Progress Notes (Signed)
POST OPERATIVE OFFICE NOTE    CC:  F/u for surgery  HPI:  This is a 59 y.o. 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 [name].  First stage was performed on 07/23/2019 also by Dr. [name].  She is doing well.  Her only complaint is some numbness in her left forearm.  She denies any pain or numbness in her left hand   The pt is not on dialysis and her nephrologist is Dr. [name].   Allergies  Allergen Reactions  . Hydrochlorothiazide Itching  . Amlodipine     Foot swelling/chest pain   . Duloxetine     Hallucinations   . Lisinopril Cough  . Metformin     Unknown reaction  . Codeine Itching    No hives or rash, takes benadryl  . Dilaudid [Hydromorphone Hcl] Itching    "mild itching hrs after dilaudid given"    Current Outpatient Medications  Medication Sig Dispense Refill  . acetaminophen (TYLENOL) 500 MG tablet Take 1,000 mg by mouth 2 (two) times daily.     Marland Kitchen albuterol (PROAIR HFA) 108 (90 Base) MCG/ACT inhaler Inhale 2 puffs into the lungs every 4 (four) hours as needed for wheezing or shortness of breath.     Marland Kitchen aspirin EC 81 MG tablet Take 81 mg by mouth daily as needed for mild pain or moderate pain. Swallow whole.    . calcitRIOL (ROCALTROL) 0.5 MCG capsule Take 0.25 mcg by mouth daily.     . cloNIDine (CATAPRES) 0.2 MG tablet Take 1 tablet (0.2 mg total) by mouth 3 (three) times daily. (Patient taking differently: Take 0.2 mg by mouth daily as needed (High Blood pressure). ) 90 tablet 0  . furosemide (LASIX) 80 MG tablet Take 80 mg by mouth 2 (two) times daily.     Marland Kitchen gabapentin (NEURONTIN) 300 MG capsule TAKE 1 CAPSULE BY MOUTH THREE TIMES DAILY 90 capsule 0  . glimepiride (AMARYL) 4 MG tablet Take 4 mg by mouth in the morning and at bedtime.     Marland Kitchen HUMULIN N 100 UNIT/ML injection Inject 20 Units into the skin 2 (two) times daily.     . hydrALAZINE (APRESOLINE) 50 MG tablet Take 1 tablet (50 mg total) by mouth every 8 (eight) hours. (Patient not taking: Reported  on 09/22/2019) 90 tablet 0  . isosorbide mononitrate (IMDUR) 30 MG 24 hr tablet Take 0.5 tablets (15 mg total) by mouth daily. (Patient taking differently: Take 30 mg by mouth daily. ) 15 tablet 0  . labetalol (NORMODYNE) 200 MG tablet Take 200 mg by mouth 2 (two) times daily. Take with a 300mg  tablet for a total dose of 500mg     . labetalol (NORMODYNE) 300 MG tablet Take 1 tablet (300 mg total) by mouth 2 (two) times daily. 60 tablet 0  . lidocaine (LIDODERM) 5 % Place 1 patch onto the skin daily as needed for pain.    . nitroGLYCERIN (NITROSTAT) 0.4 MG SL tablet Place 0.4 mg under the tongue every 5 (five) minutes as needed for chest pain.    Marland Kitchen oxyCODONE-acetaminophen (PERCOCET) 10-325 MG tablet Take 1 tablet by mouth every 6 (six) hours as needed for pain. 20 tablet 0  . sodium bicarbonate 650 MG tablet Take 1,300 mg by mouth 3 (three) times daily.     . sodium zirconium cyclosilicate (LOKELMA) 10 g PACK packet Take 10 g by mouth daily.     . traMADol (ULTRAM) 50 MG tablet TAKE 1 TABLET  BY MOUTH EVERY 6 HOURS AS NEEDED FOR MODERATE PAIN 15 tablet 0   No current facility-administered medications for this visit.   Facility-Administered Medications Ordered in Other Visits  Medication Dose Route Frequency Provider Last Rate Last Admin  . 0.9 %  sodium chloride infusion   Intravenous Continuous PRN Neldon Newport, CRNA   New Bag at 07/02/19 1304     ROS:  See HPI  Physical Exam:  Today's Vitals   10/09/19 0908  BP: 133/69  Pulse: 71  Resp: 16  Temp: (!) 97.5 F (36.4 C)  TempSrc: Temporal  SpO2: 99%  Weight: 246 lb (111.6 kg)  Height: 5\' 5"  (1.651 m)  PainSc: 4    Body mass index is 40.94 kg/m.   Incisions:  Healing nicely Extremities:   There is a palpable left radial pulse.   Motor and sensory are in tact.   There is a thrill present.     Assessment/Plan:  This is a 59 y.o. 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 [name].  First stage was performed on  07/23/2019 also by Dr. [name].   -the pt does not have evidence of steal.  She does have some numbness in the left forearm that is most likely related to nerve irritation.  Discussed this will hopefully get better over time.   -the fistula/graft can be used 10/28/2019 if needed.  -I discussed with pt that the fistula will eventually need some maintenance in the future or even new access as these do not last forever.  She expressed understanding.  -the pt will follow up as needed.   Leontine Locket, [facility] [phone]  Clinic MD:  [name]

## 2020-03-15 DIAGNOSIS — Z8616 Personal history of COVID-19: Secondary | ICD-10-CM | POA: Insufficient documentation

## 2020-03-21 ENCOUNTER — Emergency Department (HOSPITAL_COMMUNITY)
Admission: EM | Admit: 2020-03-21 | Discharge: 2020-03-21 | Disposition: A | Discharge status: Home / Self Care | Payer: Medicare Other | Attending: Emergency Medicine | Admitting: Emergency Medicine

## 2020-03-21 ENCOUNTER — Encounter (HOSPITAL_COMMUNITY): Payer: Self-pay | Admitting: Emergency Medicine

## 2020-03-21 ENCOUNTER — Other Ambulatory Visit: Payer: Self-pay

## 2020-03-21 DIAGNOSIS — J45909 Unspecified asthma, uncomplicated: Secondary | ICD-10-CM | POA: Insufficient documentation

## 2020-03-21 DIAGNOSIS — Z5321 Procedure and treatment not carried out due to patient leaving prior to being seen by health care provider: Secondary | ICD-10-CM | POA: Diagnosis not present

## 2020-03-21 DIAGNOSIS — R079 Chest pain, unspecified: Secondary | ICD-10-CM | POA: Diagnosis not present

## 2020-03-21 DIAGNOSIS — R0602 Shortness of breath: Secondary | ICD-10-CM | POA: Insufficient documentation

## 2020-03-21 LAB — I-STAT BETA HCG BLOOD, ED (MC, WL, AP ONLY): I-stat hCG, quantitative: <5 m[IU]/mL (ref <5)

## 2020-03-21 LAB — CBC
HCT: 28.9 % — ABNORMAL LOW (ref 36.0–46.0)
Hemoglobin: 9.6 g/dL — ABNORMAL LOW (ref 12.0–15.0)
MCH: 29 pg (ref 26.0–34.0)
MCHC: 33.2 g/dL (ref 30.0–36.0)
MCV: 87.3 fL (ref 80.0–100.0)
Platelets: 340 10*3/uL (ref 150–400)
RBC: 3.31 MIL/uL — ABNORMAL LOW (ref 3.87–5.11)
RDW: 17.4 % — ABNORMAL HIGH (ref 11.5–15.5)
WBC: 15.9 10*3/uL — ABNORMAL HIGH (ref 4.0–10.5)
nRBC: 0 % (ref 0.0–0.2)

## 2020-03-21 LAB — BASIC METABOLIC PANEL
Anion gap: 12 (ref 5–15)
BUN: 38 mg/dL — ABNORMAL HIGH (ref 6–20)
CO2: 23 mmol/L (ref 22–32)
Calcium: 8.7 mg/dL — ABNORMAL LOW (ref 8.9–10.3)
Chloride: 103 mmol/L (ref 98–111)
Creatinine, Ser: 4.45 mg/dL — ABNORMAL HIGH (ref 0.44–1.00)
GFR, Estimated: 11 mL/min — ABNORMAL LOW (ref >60)
Glucose, Bld: 278 mg/dL — ABNORMAL HIGH (ref 70–99)
Potassium: 4.5 mmol/L (ref 3.5–5.1)
Sodium: 138 mmol/L (ref 135–145)

## 2020-03-21 LAB — TROPONIN I (HIGH SENSITIVITY): Troponin I (High Sensitivity): 14 ng/L (ref <18)

## 2020-03-21 LAB — BRAIN NATRIURETIC PEPTIDE: B Natriuretic Peptide: 278.7 pg/mL — ABNORMAL HIGH (ref 0.0–100.0)

## 2020-03-21 NOTE — ED Notes (Signed)
Pt is refusing vitals at this time, saying she doesn't want to be reassessed until she talks to a Dr.

## 2020-03-21 NOTE — ED Triage Notes (Signed)
Pt states hx of asthma, felt sob last night and thought she was having an asthma attack. Used inhaler without relief. Continued sob with some chest pain today. Tried to see PCP who said they couldn't see her for 2 weeks. Went to UC and had breathing treatment and cxray, was told she had fluid in her lungs that was concerning for CHF. States she had been told she probably had CHF previously but has never been treated for it. Respirations labored with exertion and talking.

## 2020-03-21 NOTE — ED Notes (Addendum)
Pt reports UC in [place] advised they could send results of cxray once she was here and we requested it. ED secretary will obtain release of info from patient to attempt to get cxray results.

## 2020-03-21 NOTE — ED Notes (Signed)
Pt left at this time saying she isnt going to wait 23 hours.

## 2020-03-24 DIAGNOSIS — I517 Cardiomegaly: Secondary | ICD-10-CM | POA: Diagnosis not present

## 2020-04-21 ENCOUNTER — Encounter: Payer: Self-pay | Admitting: Gastroenterology

## 2020-04-21 ENCOUNTER — Ambulatory Visit (INDEPENDENT_AMBULATORY_CARE_PROVIDER_SITE_OTHER): Payer: Medicare Other | Admitting: Gastroenterology

## 2020-04-21 ENCOUNTER — Other Ambulatory Visit (INDEPENDENT_AMBULATORY_CARE_PROVIDER_SITE_OTHER): Payer: Medicare Other

## 2020-04-21 VITALS — BP 140/80 | HR 70 | Ht 65.0 in | Wt 231.2 lb

## 2020-04-21 DIAGNOSIS — A048 Other specified bacterial intestinal infections: Secondary | ICD-10-CM | POA: Diagnosis not present

## 2020-04-21 DIAGNOSIS — Z8711 Personal history of peptic ulcer disease: Secondary | ICD-10-CM

## 2020-04-21 DIAGNOSIS — D649 Anemia, unspecified: Secondary | ICD-10-CM

## 2020-04-21 LAB — CBC WITH DIFFERENTIAL/PLATELET
Basophils Absolute: 0 10*3/uL (ref 0.0–0.1)
Basophils Relative: 0.4 % (ref 0.0–3.0)
Eosinophils Absolute: 0.2 10*3/uL (ref 0.0–0.7)
Eosinophils Relative: 2.1 % (ref 0.0–5.0)
HCT: 38.3 % (ref 36.0–46.0)
Hemoglobin: 12.9 g/dL (ref 12.0–15.0)
Lymphocytes Relative: 21.1 % (ref 12.0–46.0)
Lymphs Abs: 2.4 10*3/uL (ref 0.7–4.0)
MCHC: 33.7 g/dL (ref 30.0–36.0)
MCV: 86.9 fl (ref 78.0–100.0)
Monocytes Absolute: 0.6 10*3/uL (ref 0.1–1.0)
Monocytes Relative: 5.2 % (ref 3.0–12.0)
Neutro Abs: 8 10*3/uL — ABNORMAL HIGH (ref 1.4–7.7)
Neutrophils Relative %: 71.2 % (ref 43.0–77.0)
Platelets: 322 10*3/uL (ref 150.0–400.0)
RBC: 4.41 Mil/uL (ref 3.87–5.11)
RDW: 20.8 % — ABNORMAL HIGH (ref 11.5–15.5)
WBC: 11.2 10*3/uL — ABNORMAL HIGH (ref 4.0–10.5)

## 2020-04-21 LAB — B12 AND FOLATE PANEL
Folate: 5.5 ng/mL — ABNORMAL LOW (ref >5.9)
Vitamin B-12: 440 pg/mL (ref 211–911)

## 2020-04-21 LAB — IBC PANEL
Iron: 55 ug/dL (ref 42–145)
Saturation Ratios: 22.4 % (ref 20.0–50.0)
Transferrin: 175 mg/dL — ABNORMAL LOW (ref 212.0–360.0)

## 2020-04-21 LAB — FERRITIN: Ferritin: 308.3 ng/mL — ABNORMAL HIGH (ref 10.0–291.0)

## 2020-04-21 LAB — RETICULOCYTES
ABS Retic: 139500 cells/uL — ABNORMAL HIGH (ref 20000–8000)
Retic Ct Pct: 3.1 %

## 2020-04-21 MED ORDER — GOLYTELY 236 G PO SOLR
4000.0000 mL | Freq: Once | ORAL | 0 refills | Status: AC
Start: 2020-04-21 — End: 2020-04-21

## 2020-04-21 NOTE — Progress Notes (Addendum)
[facility] Consult Note:  History: [name] [name] 04/21/2020  Referring provider: [facility] (unknown provider)  Reason for consult/chief complaint: Anemia (Hx of stomach ulcers- Pcp wanted to make sure that pt does not have bleeding ulcers that causing Anemia. Pt has been given oral iron and IV iron. No improvement over time. )   Subjective  HPI:  This is a 60 year old woman seen apparently at the request of her dialysis physician for ongoing anemia.  She has been seen by [place] in the past for anemia.  NSAID induced gastric ulcer with anemia in July 2018, H. pylori positive and treated. Repeat upper endoscopy August 2018 showed healing of the ulcer and biopsies showed persistent H. pylori, then treated with amoxicillin, Levaquin and omeprazole with plans for close follow-up and urea breath test to confirm eradication.  However, the patient has not been seen in clinic since then. Last colonoscopy by Dr. Deatra Ina in February 2014 was a normal study.   [name] [name] is a poor historian with limited health literacy, and unfortunately we had very little in the way of recent outside records related to her care.  An additional challenges at her care is fragmented over multiple practices in health systems.  She tells me that she has had ongoing anemia despite getting iron treatments and other "shots" as recently as this week.  She has not been seen by hematology.  She does not look at her bowel movements so does not know if she has black or bloody stool.  She told me a couple of things that were of questionable relation to the current issue such as having some shortness of breath persist after a Covid infection.  She went to the ED in [place] sometime last month and then saw primary care.  Those records are not available today.  She also says that she has been evaluated for potential kidney transplant, but says it is a 5-year process and she has lots of testing to get done  first. There are some epic encounters from [facility] renal transplant, and it appears perhaps she is in early stages of consideration.  She had a left upper extremity fistula placed last June and a basilic vein transposition August of last year and started dialysis sometime in the last few months.  She goes Monday Wednesday Friday to [facility] in [place].  ROS:  Review of Systems  Constitutional: Negative for appetite change and unexpected weight change.  HENT: Negative for mouth sores and voice change.   Eyes: Negative for pain and redness.  Respiratory: Negative for cough and shortness of breath.   Cardiovascular: Negative for chest pain and palpitations.  Genitourinary: Negative for dysuria and hematuria.  Musculoskeletal: Positive for back pain and myalgias. Negative for arthralgias.  Skin: Negative for pallor and rash.  Allergic/Immunologic: Positive for environmental allergies.  Neurological: Positive for headaches. Negative for weakness.  Hematological: Negative for adenopathy.  Psychiatric/Behavioral: Positive for dysphoric mood. The patient is nervous/anxious.      Past Medical History: Past Medical History:  Diagnosis Date  . Anemia   . Anxiety   . Arthritis    "hips" (08/28/2013)  . Asthma   . Chronic kidney disease   . Chronic lower back pain    10-27-13 not a problem at this time  . Depression   . Esophageal spasm   . Frequent UTI   . GERD (gastroesophageal reflux disease)   . Hepatitis C    genotype 1B  . High cholesterol   .  Hypertension   . Migraine    "monthly" (08/28/2013)  . Pneumonia    "several times"  . Type II diabetes mellitus ([place])   . Ulcer    hx gastric ulcers     Past Surgical History: Past Surgical History:  Procedure Laterality Date  . ABDOMINAL HYSTERECTOMY    . APPENDECTOMY    . AV FISTULA PLACEMENT Left 05/19/2019   Procedure: LEFT ARM Brachiocephalic ARTERIOVENOUS (AV) FISTULA CREATION;  Surgeon: Waynetta Sandy, MD;   Location: [address];  Service: Vascular;  Laterality: Left;  . AV FISTULA PLACEMENT Left 07/23/2019   Procedure: LEFT ARM ARTERIOVENOUS (AV) FISTULA CREATION;  Surgeon: Waynetta Sandy, MD;  Location: [place];  Service: Vascular;  Laterality: Left;  . BALLOON DILATION N/A 12/29/2012   Procedure: BALLOON DILATION;  Surgeon: Inda Castle, MD;  Location: [facility];  Service: Endoscopy;  Laterality: N/A;  . BALLOON DILATION N/A 06/25/2013   Procedure: BALLOON DILATION;  Surgeon: Inda Castle, MD;  Location: [facility];  Service: Endoscopy;  Laterality: N/A;  . BASCILIC VEIN TRANSPOSITION Left 09/24/2019   Procedure: SECOND STAGE LEFT BASCILIC VEIN TRANSPOSITION;  Surgeon: Waynetta Sandy, MD;  Location: [place];  Service: Vascular;  Laterality: Left;  . BOTOX INJECTION  12/29/2012   Procedure: BOTOX INJECTION;  Surgeon: Inda Castle, MD;  Location: [facility];  Service: Endoscopy;;  . BOTOX INJECTION N/A 06/25/2013   Procedure: BOTOX INJECTION;  Surgeon: Inda Castle, MD;  Location: [facility];  Service: Endoscopy;  Laterality: N/A;  . CARDIAC CATHETERIZATION  X 2   /notes 08/28/2013  . CARPAL TUNNEL RELEASE Right   . CHOLECYSTECTOMY    . COLONOSCOPY  04/10/2012   Normal   . ESOPHAGEAL MANOMETRY N/A 12/26/2015   Procedure: ESOPHAGEAL MANOMETRY (EM);  Surgeon: Doran Stabler, MD;  Location: [facility];  Service: Gastroenterology;  Laterality: N/A;  . ESOPHAGOGASTRODUODENOSCOPY N/A 12/29/2012   Procedure: ESOPHAGOGASTRODUODENOSCOPY (EGD);  Surgeon: Inda Castle, MD;  Location: [facility];  Service: Endoscopy;  Laterality: N/A;  . ESOPHAGOGASTRODUODENOSCOPY N/A 06/25/2013   Procedure: ESOPHAGOGASTRODUODENOSCOPY (EGD);  Surgeon: Inda Castle, MD;  Location: [facility];  Service: Endoscopy;  Laterality: N/A;  . ESOPHAGOGASTRODUODENOSCOPY N/A 11/03/2013   Procedure: ESOPHAGOGASTRODUODENOSCOPY (EGD);  Surgeon: Inda Castle, MD;  Location: [facility];   Service: Endoscopy;  Laterality: N/A;  Botox - ballon dilation  . ESOPHAGOGASTRODUODENOSCOPY N/A 08/14/2016   Procedure: ESOPHAGOGASTRODUODENOSCOPY (EGD);  Surgeon: Milus Banister, MD;  Location: [facility];  Service: Endoscopy;  Laterality: N/A;  . FOOT FRACTURE SURGERY Right   . HEEL SPUR EXCISION Right   . TONSILLECTOMY    . TUBAL LIGATION       Family History: Family History  Problem Relation Age of Onset  . Hypertension Mother   . Alzheimer's disease Father   . Throat cancer Sister 31       died at age 77  . Diabetes Brother   . Colon cancer Neg Hx   . Rectal cancer Neg Hx   . Stomach cancer Neg Hx     Social History: Social History   Socioeconomic History  . Marital status: Married    Spouse name: Lanny Hurst   . Number of children: 2  . Years of education: Not on file  . Highest education level: Not on file  Occupational History  . Occupation: unempoyed  Tobacco Use  . Smoking status: Never Smoker  . Smokeless tobacco: Never Used  Vaping Use  . Vaping Use:  Never used  Substance and Sexual Activity  . Alcohol use: No  . Drug use: No  . Sexual activity: Yes    Partners: Male  Other Topics Concern  . Not on file  Social History Narrative  . Not on file   Social Determinants of Health   Financial Resource Strain: Not on file  Food Insecurity: Not on file  Transportation Needs: Not on file  Physical Activity: Not on file  Stress: Not on file  Social Connections: Not on file    Allergies: Allergies  Allergen Reactions  . Hydrochlorothiazide Itching  . Amlodipine     Foot swelling/chest pain   . Duloxetine     Hallucinations   . Lisinopril Cough  . Metformin     Unknown reaction  . Codeine Itching    No hives or rash, takes benadryl  . Dilaudid [Hydromorphone Hcl] Itching    "mild itching hrs after dilaudid given"    Outpatient Meds: Current Outpatient Medications  Medication Sig Dispense Refill  . acetaminophen (TYLENOL) 500 MG tablet Take  1,000 mg by mouth 2 (two) times daily.    Marland Kitchen albuterol (VENTOLIN HFA) 108 (90 Base) MCG/ACT inhaler Inhale 2 puffs into the lungs every 4 (four) hours as needed for wheezing or shortness of breath.     Marland Kitchen aspirin EC 81 MG tablet Take 81 mg by mouth daily as needed for mild pain or moderate pain. Swallow whole.    . calcitRIOL (ROCALTROL) 0.5 MCG capsule Take 0.25 mcg by mouth daily.     . furosemide (LASIX) 80 MG tablet Take 80 mg by mouth 2 (two) times daily.     Marland Kitchen gabapentin (NEURONTIN) 300 MG capsule TAKE 1 CAPSULE BY MOUTH THREE TIMES DAILY 90 capsule 0  . glimepiride (AMARYL) 4 MG tablet Take 4 mg by mouth in the morning and at bedtime.     . GOLYTELY 236 g solution Take 4,000 mLs by mouth once for 1 dose. 4000 mL 0  . HUMULIN N 100 UNIT/ML injection Inject 20 Units into the skin 2 (two) times daily.     Marland Kitchen labetalol (NORMODYNE) 200 MG tablet Take 200 mg by mouth 2 (two) times daily. Take with a '300mg'$  tablet for a total dose of '500mg'$     . lidocaine (LIDODERM) 5 % Place 1 patch onto the skin daily as needed for pain.    . nitroGLYCERIN (NITROSTAT) 0.4 MG SL tablet Place 0.4 mg under the tongue every 5 (five) minutes as needed for chest pain.    Marland Kitchen oxyCODONE-acetaminophen (PERCOCET) 10-325 MG tablet Take 1 tablet by mouth every 6 (six) hours as needed for pain. 20 tablet 0  . sodium bicarbonate 650 MG tablet Take 1,300 mg by mouth 3 (three) times daily.     . sodium zirconium cyclosilicate (LOKELMA) 10 g PACK packet Take 10 g by mouth daily.     . traMADol (ULTRAM) 50 MG tablet TAKE 1 TABLET BY MOUTH EVERY 6 HOURS AS NEEDED FOR MODERATE PAIN 15 tablet 0  . cloNIDine (CATAPRES) 0.2 MG tablet Take 1 tablet (0.2 mg total) by mouth 3 (three) times daily. (Patient taking differently: Take 0.2 mg by mouth daily as needed (High Blood pressure). ) 90 tablet 0  . isosorbide mononitrate (IMDUR) 30 MG 24 hr tablet Take 0.5 tablets (15 mg total) by mouth daily. (Patient taking differently: Take 30 mg by mouth  daily. Takes 1/2 tablet prn) 15 tablet 0   No current facility-administered medications for this visit.  Facility-Administered Medications Ordered in Other Visits  Medication Dose Route Frequency Provider Last Rate Last Admin  . 0.9 %  sodium chloride infusion   Intravenous Continuous PRN Neldon Newport, CRNA   New Bag at 07/02/19 1304      ___________________________________________________________________ Objective   Exam:  BP 140/80   Pulse 70   Ht '5\' 5"'$  (1.651 m)   Wt 231 lb 3.2 oz (104.9 kg)   SpO2 97%   BMI 38.47 kg/m  Wt Readings from Last 3 Encounters:  04/21/20 231 lb 3.2 oz (104.9 kg)  10/09/19 246 lb (111.6 kg)  09/24/19 250 lb (113.4 kg)     General: Ambulatory, pleasant and conversational, gets on exam table without difficulty.  Eyes: sclera anicteric, no redness  ENT: oral mucosa moist without lesions, no cervical or supraclavicular lymphadenopathy  CV: RRR without murmur, S1/S2, no JVD, no peripheral edema  Resp: clear to auscultation bilaterally, normal RR and effort noted  GI: soft, no tenderness, with active bowel sounds. No guarding or palpable organomegaly noted.  Limited by body habitus  Skin; warm and dry, no rash or jaundice noted  Neuro: awake, alert and oriented x 3. Normal gross motor function and fluent speech Left upper arm fistula with palpable thrill Labs:  CBC Latest Ref Rng & Units 04/21/2020 03/21/2020 09/24/2019  WBC 4.0 - 10.5 K/uL 11.2(H) 15.9(H) -  Hemoglobin 12.0 - 15.0 g/dL 12.9 9.6(L) 10.5(L)  Hematocrit 36.0 - 46.0 % 38.3 28.9(L) 31.0(L)  Platelets 150.0 - 400.0 K/uL 322.0 340 -    CMP Latest Ref Rng & Units 03/21/2020 09/24/2019 08/18/2019  Glucose 70 - 99 mg/dL 278(H) 202(H) 253(H)  BUN 6 - 20 mg/dL 38(H) 71(H) 48(H)  Creatinine 0.44 - 1.00 mg/dL 4.45(H) 6.10(H) 4.52(H)  Sodium 135 - 145 mmol/L 138 136 140  Potassium 3.5 - 5.1 mmol/L 4.5 4.6 5.1  Chloride 98 - 111 mmol/L 103 100 110  CO2 22 - 32 mmol/L 23 - 19(L)   Calcium 8.9 - 10.3 mg/dL 8.7(L) - 8.2(L)  Total Protein 6.5 - 8.1 g/dL - - -  Total Bilirubin 0.3 - 1.2 mg/dL - - -  Alkaline Phos 38 - 126 U/L - - -  AST 15 - 41 U/L - - -  ALT 0 - 44 U/L - - -   Our staff were able to find labs from [facility] dated 03/23/2020:  WBC 13.6, hemoglobin 9.4, hematocrit 29, Iron 66, TIBC 230, ferritin 456 Hemoglobin was 10 on February 23   This patient has a normocytic anemia going back to at least 2018.  Iron studies normal in July 2018, none available in this EMR since then.  Folic acid and 123456 were normal at that time as well.  Radiologic Studies:  June 2020 2D echo:  1. The left ventricle has normal systolic function with an ejection  fraction of 60-65%. The cavity size was normal. There is mildly increased  left ventricular wall thickness. Left ventricular diastolic Doppler  parameters are consistent with impaired  relaxation.   2. The right ventricle has normal systolic function. The cavity was  normal. There is no increase in right ventricular wall thickness.    Assessment: Encounter Diagnoses  Name Primary?  . Normocytic anemia Yes  . History of gastric ulcer   . H. pylori infection     Longstanding normocytic anemia with normal iron levels that appears related to chronic kidney disease and perhaps some other primary production problem.  She may have been getting iron  and EPO lately but we do not know for sure.  We will attempt to get some records from [facility] for further information.  She has a history of ulcer from NSAID use and H. pylori.  Unknown if H. pylori is cleared because she did not have follow-up breath testing done.  Nevertheless, it was a small gastric ulcer, clean-based and may not of been a significant contributing factor to her anemia.   Plan:  Upper endoscopy and colonoscopy to rule out a source of occult GI blood loss.  We can also confirm ulcer healing, biopsy stomach to confirm H. pylori eradication, and  do colon cancer screening since she may be a candidate for transplant consideration at some point.   She was agreeable after discussion of procedure and risks  The benefits and risks of the planned procedure were described in detail with the patient or (when appropriate) their health care proxy.  Risks were outlined as including, but not limited to, bleeding, infection, perforation, adverse medication reaction leading to cardiac or pulmonary decompensation, pancreatitis (if ERCP).  The limitation of incomplete mucosal visualization was also discussed.  No guarantees or warranties were given.  Must be done to work around her dialysis schedule, adjustments to insulin regimen, and a 4 L PEG prep due to kidney disease.  CBC, iron studies, 123456 and folic acid and reticulocyte count today.  Probable hematology referral to follow.  (Extensive chart review and face-to-face conversation required to obtain relevant history and data on this patient with multiple chronic medical issues.) Thank you for the courtesy of this consult.  Please call me with any questions or concerns.  Nelida Meuse III  CC: Referring provider noted above

## 2020-04-21 NOTE — Patient Instructions (Signed)
If you are age 59 or older, your body mass index should be between 23-30. Your Body mass index is 38.47 kg/m. If this is out of the aforementioned range listed, please consider follow up with your Primary Care Provider.  If you are age 77 or younger, your body mass index should be between 19-25. Your Body mass index is 38.47 kg/m. If this is out of the aformentioned range listed, please consider follow up with your Primary Care Provider.   Your provider has requested that you go to the basement level for lab work before leaving today. Press "B" on the elevator. The lab is located at the first door on the left as you exit the elevator.  You have been scheduled for an endoscopy and colonoscopy. Please follow the written instructions given to you at your visit today. Please pick up your prep supplies at the pharmacy within the next 1-3 days. If you use inhalers (even only as needed), please bring them with you on the day of your procedure.  Follow up pending at this time.  It was a pleasure to see you today!  Dr. Loletha Carrow

## 2020-04-22 ENCOUNTER — Other Ambulatory Visit: Payer: Self-pay

## 2020-04-22 MED ORDER — FOLIC ACID 1 MG PO TABS: 1.0000 mg | ORAL_TABLET | Freq: Every day | ORAL | 0 refills | Status: DC

## 2020-06-14 ENCOUNTER — Other Ambulatory Visit: Payer: Self-pay

## 2020-06-14 ENCOUNTER — Ambulatory Visit (AMBULATORY_SURGERY_CENTER): Payer: Medicare Other | Admitting: Gastroenterology

## 2020-06-14 ENCOUNTER — Encounter: Payer: Self-pay | Admitting: Gastroenterology

## 2020-06-14 VITALS — BP 134/74 | HR 74 | Temp 97.3°F | Resp 17 | Ht 65.0 in | Wt 231.0 lb

## 2020-06-14 DIAGNOSIS — D509 Iron deficiency anemia, unspecified: Secondary | ICD-10-CM

## 2020-06-14 DIAGNOSIS — B9681 Helicobacter pylori [H. pylori] as the cause of diseases classified elsewhere: Secondary | ICD-10-CM

## 2020-06-14 DIAGNOSIS — K2970 Gastritis, unspecified, without bleeding: Secondary | ICD-10-CM

## 2020-06-14 DIAGNOSIS — K2950 Unspecified chronic gastritis without bleeding: Secondary | ICD-10-CM | POA: Diagnosis not present

## 2020-06-14 DIAGNOSIS — D508 Other iron deficiency anemias: Secondary | ICD-10-CM

## 2020-06-14 DIAGNOSIS — Z8711 Personal history of peptic ulcer disease: Secondary | ICD-10-CM

## 2020-06-14 DIAGNOSIS — D649 Anemia, unspecified: Secondary | ICD-10-CM

## 2020-06-14 MED ORDER — SODIUM CHLORIDE 0.9 % IV SOLN: 500.0000 mL | Freq: Once | INTRAVENOUS | Status: DC

## 2020-06-14 NOTE — Op Note (Signed)
[facility] Patient Name: [name] [name] Procedure Date: 06/14/2020 3:34 PM MRN: [id number] Endoscopist: Mallie Mussel L. Loletha Carrow , MD Age: 60 Referring MD:  Date of Birth: 20-Oct-1960 Gender: Female Account #: [id number] Procedure:                Colonoscopy Indications:              Unexplained iron deficiency anemia (ESRD on HD) Medicines:                Monitored Anesthesia Care Procedure:                Pre-Anesthesia Assessment:                           - Prior to the procedure, a History and Physical                            was performed, and patient medications and                            allergies were reviewed. The patient's tolerance of                            previous anesthesia was also reviewed. The risks                            and benefits of the procedure and the sedation                            options and risks were discussed with the patient.                            All questions were answered, and informed consent                            was obtained. Prior Anticoagulants: The patient has                            taken no previous anticoagulant or antiplatelet                            agents. ASA Grade Assessment: III - A patient with                            severe systemic disease. After reviewing the risks                            and benefits, the patient was deemed in                            satisfactory condition to undergo the procedure.                           After obtaining informed consent, the colonoscope  was passed under direct vision. Throughout the                            procedure, the patient's blood pressure, pulse, and                            oxygen saturations were monitored continuously. The                            Olympus CF-HQ190 [id number]) Colonoscope was                            introduced through the anus and advanced to the the                            cecum,  identified by appendiceal orifice and                            ileocecal valve. The colonoscopy was performed with                            difficulty due to a redundant colon and significant                            looping. Successful completion of the procedure was                            aided by using manual pressure. The patient                            tolerated the procedure fairly well. The quality of                            the bowel preparation was good. The ileocecal                            valve, appendiceal orifice, and rectum were                            photographed. The bowel preparation used was                            GoLYTELY via split dose instruction. Scope In: 3:49:33 PM Scope Out: 4:08:51 PM Scope Withdrawal Time: 0 hours 10 minutes 0 seconds  Total Procedure Duration: 0 hours 19 minutes 18 seconds  Findings:                 The perianal and digital rectal examinations were                            normal.                           The entire examined colon appeared normal on direct  and retroflexion views. Complications:            No immediate complications. Estimated Blood Loss:     Estimated blood loss: none. Impression:               - The entire examined colon is normal on direct and                            retroflexion views.                           - No specimens collected. Recommendation:           - Patient has a contact number available for                            emergencies. The signs and symptoms of potential                            delayed complications were discussed with the                            patient. Return to normal activities tomorrow.                            Written discharge instructions were provided to the                            patient.                           - Resume previous diet.                           - Continue present medications.                            - Repeat colonoscopy in 10 years for screening                            purposes.                           - See the other procedure note for documentation of                            additional recommendations. Macklyn Glandon L. Loletha Carrow, MD 06/14/2020 4:18:35 PM This report has been signed electronically. CC Letter to:             [facility]

## 2020-06-14 NOTE — Patient Instructions (Signed)
Repeat colonoscopy in 10 years for screening purposes.   No signs of bleeding noted on either the colonoscopy or the upper endoscopy.   YOU HAD AN ENDOSCOPIC PROCEDURE TODAY AT [facility]:   Refer to the procedure report that was given to you for any specific questions about what was found during the examination.  If the procedure report does not answer your questions, please call your gastroenterologist to clarify.  If you requested that your care partner not be given the details of your procedure findings, then the procedure report has been included in a sealed envelope for you to review at your convenience later.  YOU SHOULD EXPECT: Some feelings of bloating in the abdomen. Passage of more gas than usual.  Walking can help get rid of the air that was put into your GI tract during the procedure and reduce the bloating. If you had a lower endoscopy (such as a colonoscopy or flexible sigmoidoscopy) you may notice spotting of blood in your stool or on the toilet paper. If you underwent a bowel prep for your procedure, you may not have a normal bowel movement for a few days.  Please Note:  You might notice some irritation and congestion in your nose or some drainage.  This is from the oxygen used during your procedure.  There is no need for concern and it should clear up in a day or so.  SYMPTOMS TO REPORT IMMEDIATELY:   Following lower endoscopy (colonoscopy or flexible sigmoidoscopy):  Excessive amounts of blood in the stool  Significant tenderness or worsening of abdominal pains  Swelling of the abdomen that is new, acute  Fever of 100F or higher   Following upper endoscopy (EGD)  Vomiting of blood or coffee ground material  New chest pain or pain under the shoulder blades  Painful or persistently difficult swallowing  New shortness of breath  Fever of 100F or higher  Black, tarry-looking stools  For urgent or emergent issues, a gastroenterologist can be reached at  any hour by calling [phone]. Do not use MyChart messaging for urgent concerns.    DIET:  We do recommend a small meal at first, but then you may proceed to your regular diet.  Drink plenty of fluids but you should avoid alcoholic beverages for 24 hours.  ACTIVITY:  You should plan to take it easy for the rest of today and you should NOT DRIVE or use heavy machinery until tomorrow (because of the sedation medicines used during the test).    FOLLOW UP: Our staff will call the number listed on your records 48-72 hours following your procedure to check on you and address any questions or concerns that you may have regarding the information given to you following your procedure. If we do not reach you, we will leave a message.  We will attempt to reach you two times.  During this call, we will ask if you have developed any symptoms of COVID 19. If you develop any symptoms (ie: fever, flu-like symptoms, shortness of breath, cough etc.) before then, please call [phone].  If you test positive for Covid 19 in the 2 weeks post procedure, please call and report this information to [place].    If any biopsies were taken you will be contacted by phone or by letter within the next 1-3 weeks.  Please call us at [phone] if you have not heard about the biopsies in 3 weeks.    SIGNATURES/CONFIDENTIALITY: You and/or your care partner  have signed paperwork which will be entered into your electronic medical record.  These signatures attest to the fact that that the information above on your After Visit Summary has been reviewed and is understood.  Full responsibility of the confidentiality of this discharge information lies with you and/or your care-partner.

## 2020-06-14 NOTE — Progress Notes (Signed)
A and O x3. Report to RN. Tolerated MAC anesthesia well.Teeth unchanged after procedure.

## 2020-06-14 NOTE — Op Note (Signed)
[facility] Patient Name: [name] [name] Procedure Date: 06/14/2020 3:34 PM MRN: [id number] Endoscopist: Mallie Mussel L. Loletha Carrow , MD Age: 60 Referring MD:  Date of Birth: [birth year] Gender: Female Account #: [id number] Procedure:                Upper GI endoscopy Indications:              Unexplained iron deficiency anemia, Previously                            treated for Helicobacter pylori Medicines:                Monitored Anesthesia Care Procedure:                Pre-Anesthesia Assessment:                           - Prior to the procedure, a History and Physical                            was performed, and patient medications and                            allergies were reviewed. The patient's tolerance of                            previous anesthesia was also reviewed. The risks                            and benefits of the procedure and the sedation                            options and risks were discussed with the patient.                            All questions were answered, and informed consent                            was obtained. Prior Anticoagulants: The patient has                            taken no previous anticoagulant or antiplatelet                            agents. ASA Grade Assessment: III - A patient with                            severe systemic disease. After reviewing the risks                            and benefits, the patient was deemed in                            satisfactory condition to undergo the procedure.  After obtaining informed consent, the endoscope was                            passed under direct vision. Throughout the                            procedure, the patient's blood pressure, pulse, and                            oxygen saturations were monitored continuously. The                            Endoscope was introduced through the mouth, and                            advanced to the second part  of duodenum. The upper                            GI endoscopy was accomplished without difficulty.                            The patient tolerated the procedure fairly well. Scope In: Scope Out: Findings:                 The esophagus was normal.                           The entire examined stomach was normal. Several                            biopsies were obtained in the gastric body and in                            the gastric antrum with cold forceps for histology                            (rule out H pylori).                           The cardia and gastric fundus were normal on                            retroflexion.                           The examined duodenum was normal. Complications:            No immediate complications. Estimated Blood Loss:     Estimated blood loss was minimal. Impression:               - Normal esophagus.                           - Normal stomach.                           - Normal  examined duodenum.                           - Several biopsies were obtained in the gastric                            body and in the gastric antrum.                           No source of blood seen that would contribute to                            this patient's normocytic anemia, which is most                            likely due to ESRD. Recommendation:           - Patient has a contact number available for                            emergencies. The signs and symptoms of potential                            delayed complications were discussed with the                            patient. Return to normal activities tomorrow.                            Written discharge instructions were provided to the                            patient.                           - Resume previous diet.                           - Continue present medications.                           - Await pathology results.                           - See the other procedure note for  documentation of                            additional recommendations. Dorota Heinrichs L. Loletha Carrow, MD 06/14/2020 4:21:29 PM This report has been signed electronically. CC Letter to:             [facility]

## 2020-06-16 ENCOUNTER — Telehealth: Payer: Self-pay | Admitting: *Deleted

## 2020-06-16 ENCOUNTER — Telehealth: Payer: Self-pay

## 2020-06-16 NOTE — Telephone Encounter (Signed)
Attempted f/u phone call. No answer. Mailbox is full, unable to leave message.  

## 2020-06-16 NOTE — Telephone Encounter (Signed)
  Follow up Call-  Call back number 06/14/2020  Post procedure Call Back phone  # [phone]  Permission to leave phone message Yes  Some recent data might be hidden     Patient questions:  Do you have a fever, pain , or abdominal swelling? No. Pain Score  0 *  Have you tolerated food without any problems? Yes.    Have you been able to return to your normal activities? Yes.    Do you have any questions about your discharge instructions: Diet   No. Medications  No. Follow up visit  No.  Do you have questions or concerns about your Care? No.  Actions: * If pain score is 4 or above: No action needed, pain <4.  1. Have you developed a fever since your procedure? no  2.   Have you had an respiratory symptoms (SOB or cough) since your procedure? no  3.   Have you tested positive for COVID 19 since your procedure no  4.   Have you had any family members/close contacts diagnosed with the COVID 19 since your procedure?  no   If yes to any of these questions please route to Joylene John, RN and Joella Prince, RN

## 2020-06-24 ENCOUNTER — Other Ambulatory Visit: Payer: Self-pay

## 2020-06-24 ENCOUNTER — Encounter: Payer: Self-pay | Admitting: Gastroenterology

## 2020-06-24 MED ORDER — OMEPRAZOLE 20 MG PO CPDR: 20.0000 mg | DELAYED_RELEASE_CAPSULE | Freq: Two times a day (BID) | ORAL | 0 refills | Status: DC

## 2020-06-24 MED ORDER — AMOXICILLIN 500 MG PO TABS: 1000.0000 mg | ORAL_TABLET | Freq: Two times a day (BID) | ORAL | 0 refills | Status: AC

## 2020-06-24 MED ORDER — CLARITHROMYCIN 500 MG PO TABS: 500.0000 mg | ORAL_TABLET | Freq: Two times a day (BID) | ORAL | 0 refills | Status: AC

## 2020-07-26 ENCOUNTER — Telehealth: Payer: Self-pay

## 2020-07-26 NOTE — Telephone Encounter (Signed)
-----   Message from Yevette Edwards, RN sent at 06/24/2020  1:51 PM EDT ----- Regarding: Urea Breath Test Urea breath test due on or after 08/02/20

## 2020-07-29 NOTE — Telephone Encounter (Signed)
Spoke with patient, she is aware that she will be due for Urea breath test next week. Advised that I will place the written lab order and the [facility] contact information at the receptionist desk for her to pick up next week at her convenience. Patient is aware that she will need to take the written order to [facility] with her. Patient verbalized understanding and had no concerns at the end of the call.

## 2020-08-02 ENCOUNTER — Telehealth: Payer: Self-pay

## 2020-08-02 NOTE — Telephone Encounter (Signed)
-----   Message from Yevette Edwards, RN sent at 07/29/2020  9:34 AM EDT ----- Regarding: Breath test Urea breath test, remind patient to pick up information

## 2020-08-02 NOTE — Telephone Encounter (Signed)
Spoke with patient to remind her to pick up her instructions for urea breath test. She is aware that she will need to pick up the instructions before 4:30 pm at the 3rd floor receptionist desk. Patient verbalized understanding and had no concerns at the end of the call.

## 2020-08-04 ENCOUNTER — Other Ambulatory Visit: Payer: Self-pay | Admitting: Gastroenterology

## 2020-08-05 LAB — H. PYLORI BREATH TEST: H. pylori Breath Test: NOT DETECTED

## 2020-08-16 ENCOUNTER — Telehealth: Payer: Self-pay | Admitting: Gastroenterology

## 2020-08-16 NOTE — Telephone Encounter (Signed)
(  Patient not on MyChart, and faxed result from [facility] on urea breath test)  Please give her the good news that the urea breath test shows the recent antibiotic course eliminated the H. pylori infection.  - HD

## 2020-08-16 NOTE — Telephone Encounter (Signed)
Lm on vm for patient to return call 

## 2020-08-18 NOTE — Telephone Encounter (Signed)
Spoke with patient in regards to urea breath test results. Patient verbalized understanding and had no concerns at the end of the call.

## 2020-09-02 IMAGING — CR DG NECK SOFT TISSUE
2 series · 2 of 2 positions shown · non-contrast
Comparison: None.

CLINICAL DATA: Cough, feels like something is stuck in her throat

EXAM:
NECK SOFT TISSUES - 1+ VIEW

[w soft tissue neck lat]
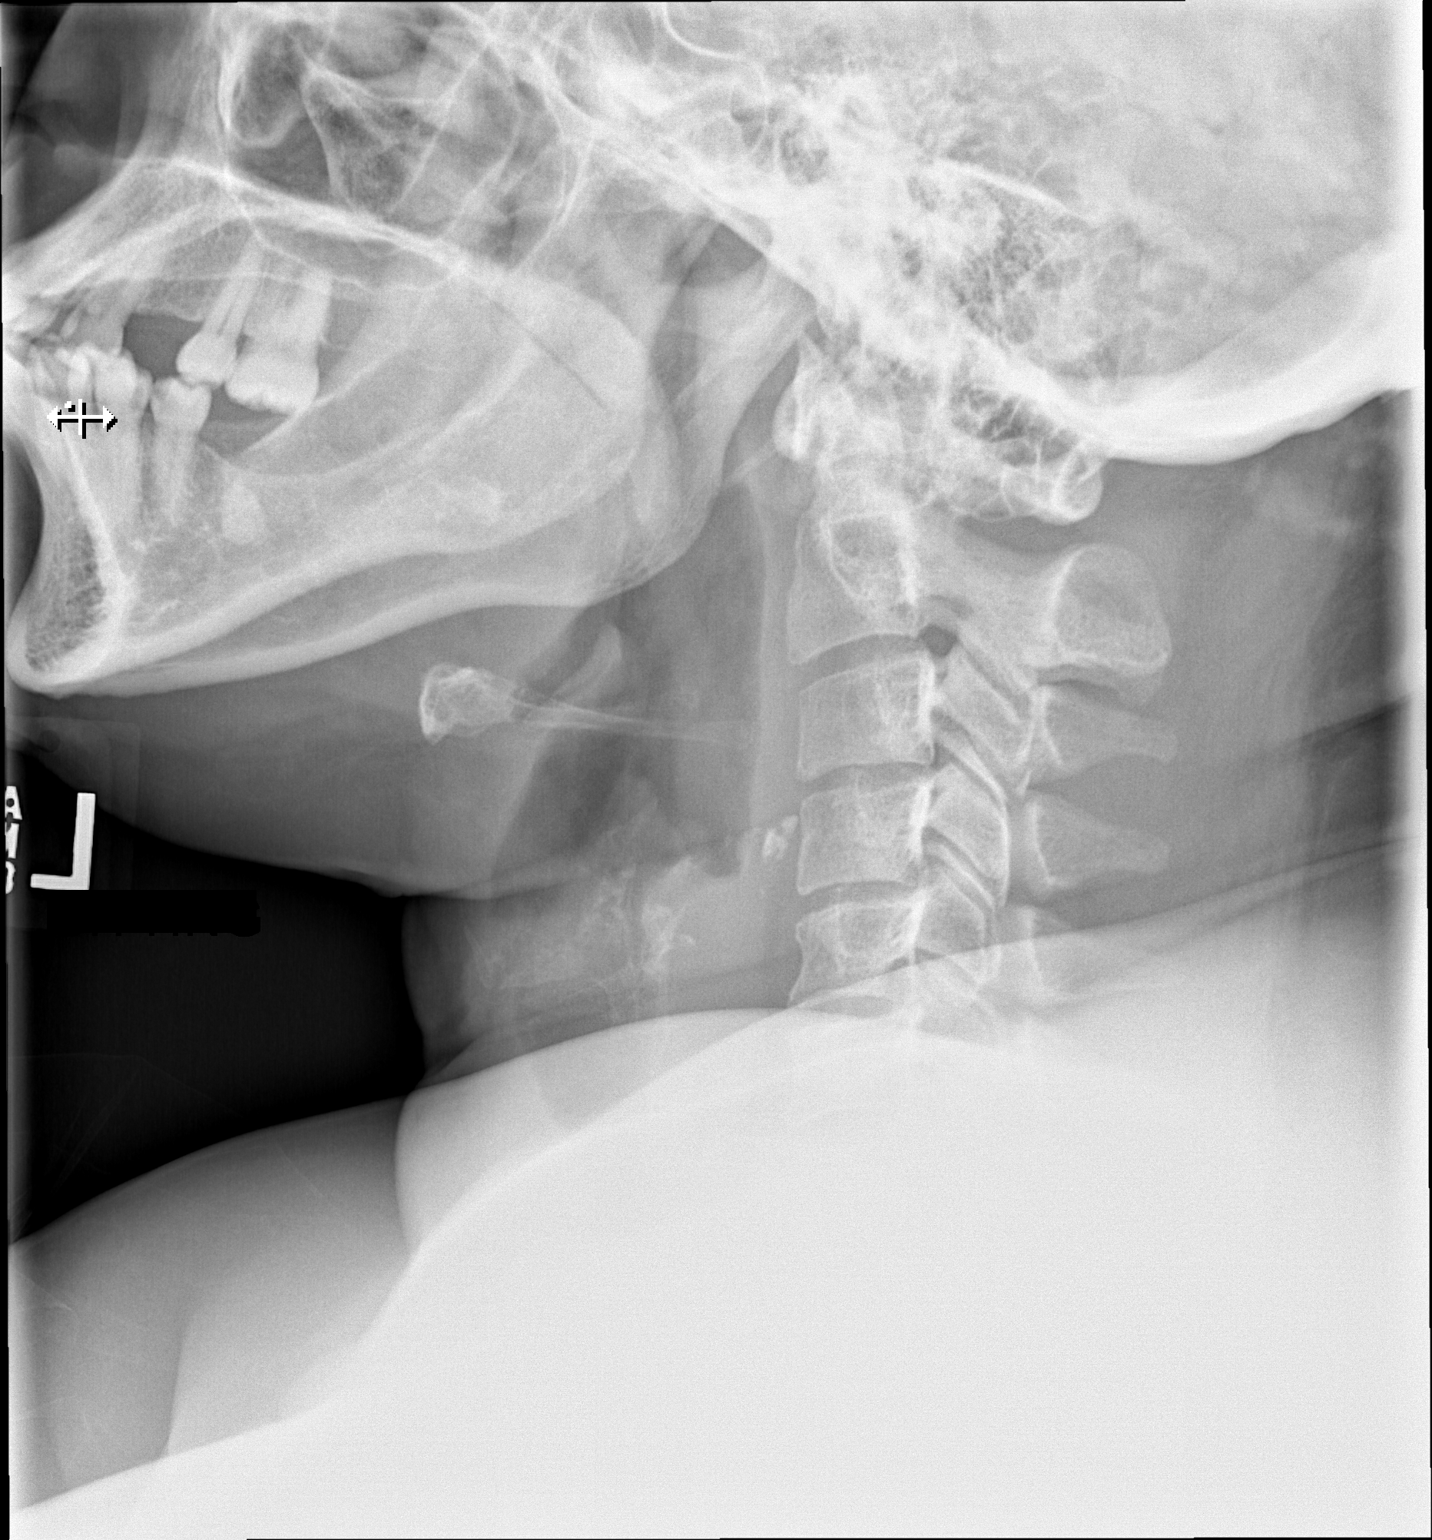

[x soft tissue neck lat]
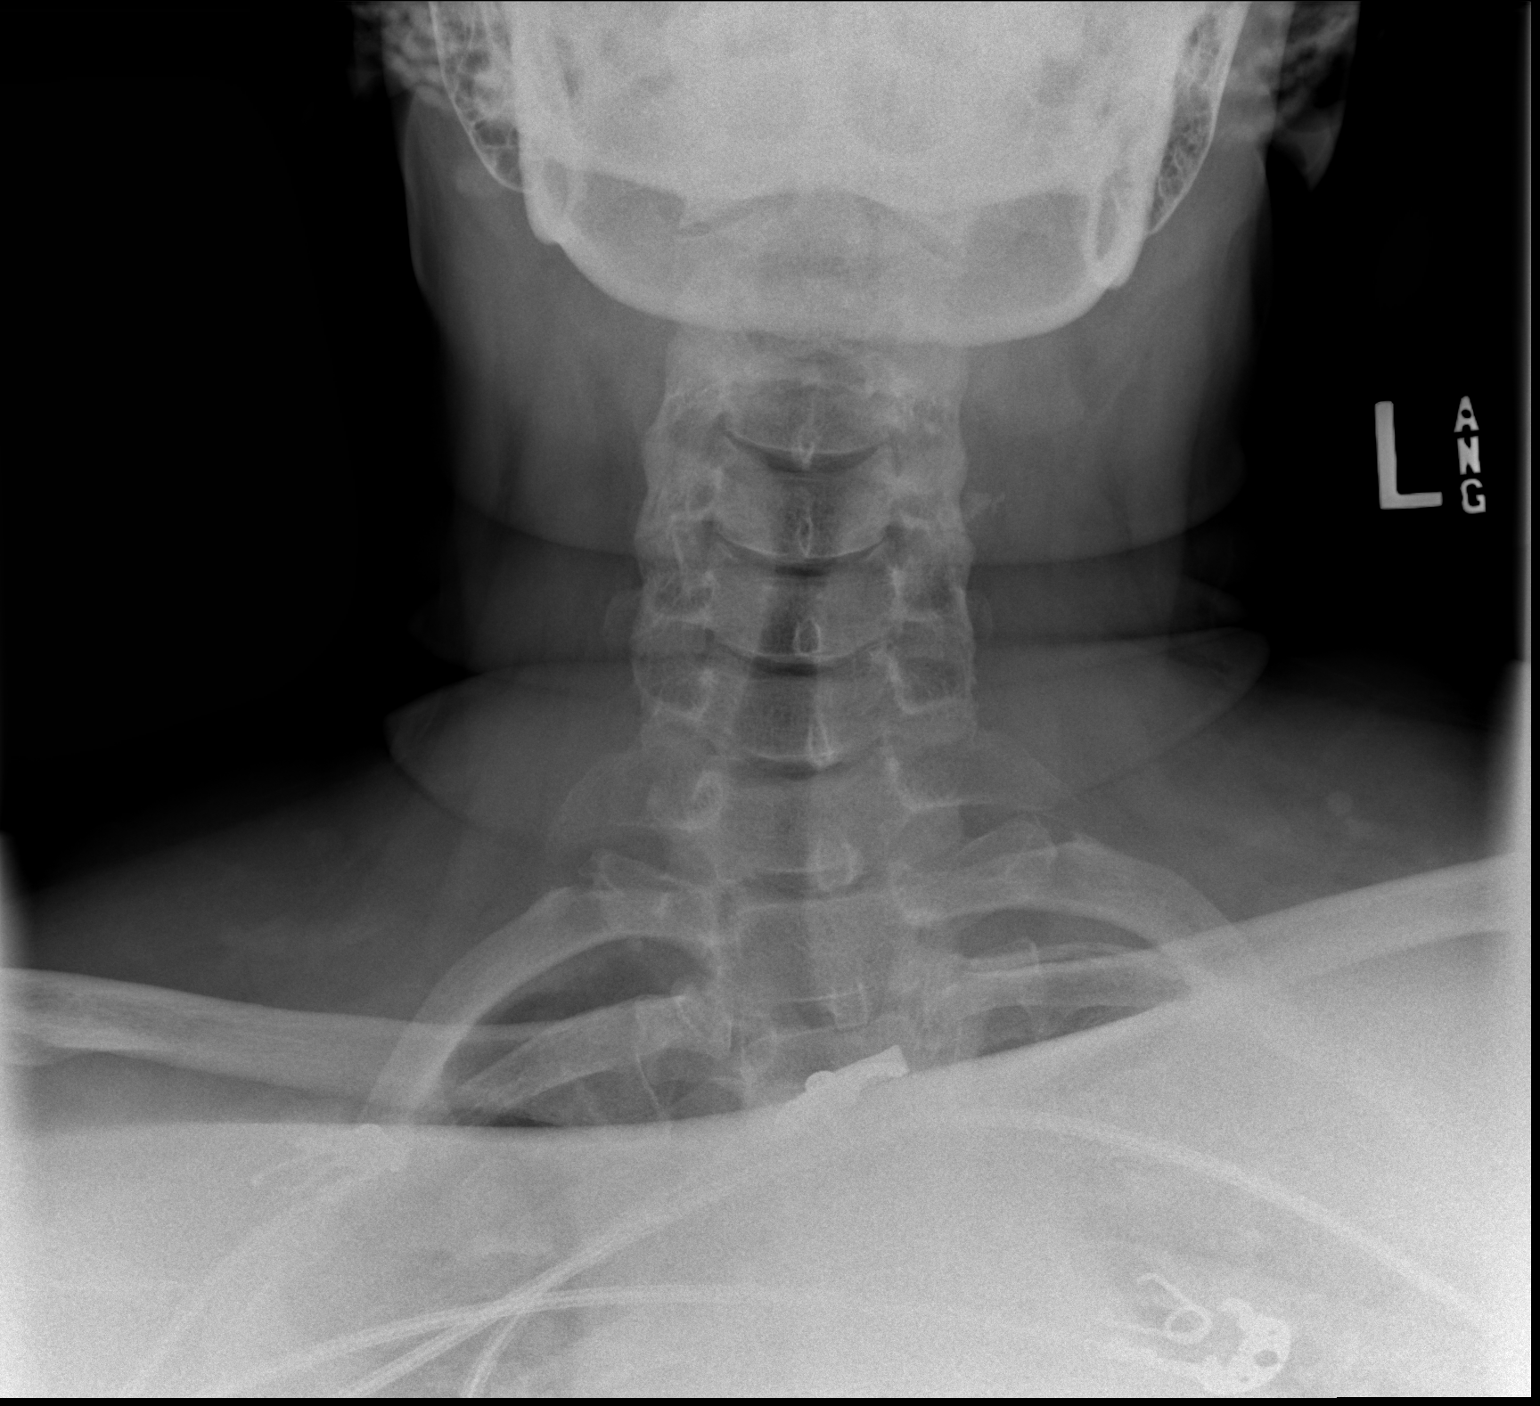

[2 of 2 positions shown; findings below may reference images not displayed]

FINDINGS: There is no evidence of retropharyngeal soft tissue swelling or
epiglottic enlargement. There is a 5 mm calcification seen posterior
to the piriform sinus, likely cartilaginous. Soft tissues are
otherwise unremarkable.
IMPRESSION: No acute abnormality.

## 2021-02-28 DIAGNOSIS — Z4802 Encounter for removal of sutures: Secondary | ICD-10-CM | POA: Insufficient documentation

## 2021-03-14 ENCOUNTER — Ambulatory Visit (INDEPENDENT_AMBULATORY_CARE_PROVIDER_SITE_OTHER): Payer: Medicare Other | Admitting: Nurse Practitioner

## 2021-03-14 ENCOUNTER — Encounter: Payer: Self-pay | Admitting: Nurse Practitioner

## 2021-03-14 VITALS — BP 110/80 | HR 81 | Ht 65.0 in | Wt 239.0 lb

## 2021-03-14 DIAGNOSIS — K921 Melena: Secondary | ICD-10-CM | POA: Diagnosis not present

## 2021-03-14 DIAGNOSIS — N184 Chronic kidney disease, stage 4 (severe): Secondary | ICD-10-CM | POA: Diagnosis not present

## 2021-03-14 DIAGNOSIS — D508 Other iron deficiency anemias: Secondary | ICD-10-CM

## 2021-03-14 NOTE — Patient Instructions (Addendum)
It was my pleasure to provide care to you today. Based on our discussion, I am providing you with my recommendations below:  RECOMMENDATION(S):  Please complete the hemoccult stool cards as soon as you can. Monitor the color of your stools. Go to the emergency room if you develop any chest pain, shortness of breath, profound fatigue or multiple loose back stools   BMI:  If you are age 60 or older, your body mass index should be between 23-30. Your Body mass index is 39.77 kg/m. If this is out of the aforementioned range listed, please consider follow up with your Primary Care Provider.  If you are age 67 or younger, your body mass index should be between 19-25. Your Body mass index is 39.77 kg/m. If this is out of the aformentioned range listed, please consider follow up with your Primary Care Provider.   MY CHART:  The [facility] providers would like to encourage you to use [facility] to communicate with providers for non-urgent requests or questions.  Due to long hold times on the telephone, sending your provider a message by [facility] may be a faster and more efficient way to get a response.  Please allow 48 business hours for a response.  Please remember that this is for non-urgent requests.   Thank you for trusting me with your gastrointestinal care!    Noralyn Pick, CRNP

## 2021-03-14 NOTE — Progress Notes (Signed)
03/14/2021 [name] [name] [id number] [birth year]   Chief Complaint: Anemia follow up, dark stools   History of Present Illness: [name] [name] is a 61 year old female with a past medical history of DM II, CKD, anemia, GERD, UGI bleed 2018, NSAID induced gastric ulcer, H. Pylori gastritis treated 2018 and chronic hepatitis C treated with Harvoni x 8 weeks 2018 per Roosevelt Locks NP at [facility]. She presents to our office today as referred by her nephrologist Dr. Johnney Ou for further evaluation regarding dark stools and recurrent low Hg levels. Hg level 11.2 -> 10.2 02/01/2021 -> Hg 8.8 on 02/15/2021 -> Hg 9.5 on 02/24/2021. She is followed by Dr. Dr. Loletha Carrow. She started noticing her stools were loose and black in color early Jan. 2023. She stopped looking at her stools for the past 2 weeks so she doesn't know if her stool color remains black or not. No obvious black stool or red blood on the toilet tissue. She feels like her stools are back to being solid for the past 2 weeks. She denies having any N/V. No upper or lower abdominal pain. No heartburn. She sometimes feels like food might go down the wrong pipe which has occurred for many years, might occur a few times monthly and has not changed since she underwent an EGD 06/14/2020 by Dr. Loletha Carrow which showed a normal esophagus and stomach. No Pepto use. No oral iron use. She thinks she's received IV iron intermittently at the dialysis center. She underwent a colonoscopy 06/14/2020 as well which was normal. She is a challenging historian.   Labs from [facility] [place] [facility]:  02/24/2021 Hg 9.5 02/15/2021: Hg 8.8 02/01/2021: Hg 10.2 01/25/2021: Hg 11.2 01/18/2021: Hg  10.1 01/11/2021: Hg 10.3 01/04/2021: Hg 11.5 12/28/2020: Hg 11.4 12/21/2020: Hg 10.6 12/14/2020: Hg 10.6  CBC Latest Ref Rng & Units 04/21/2020 03/21/2020 09/24/2019  WBC 4.0 - 10.5 K/uL 11.2(H) 15.9(H) -  Hemoglobin 12.0 - 15.0 g/dL 12.9 9.6(L) 10.5(L)  Hematocrit 36.0 - 46.0 % 38.3 28.9(L)  31.0(L)  Platelets 150.0 - 400.0 K/uL 322.0 340 -    CMP Latest Ref Rng & Units 03/21/2020 09/24/2019 08/18/2019  Glucose 70 - 99 mg/dL 278(H) 202(H) 253(H)  BUN 6 - 20 mg/dL 38(H) 71(H) 48(H)  Creatinine 0.44 - 1.00 mg/dL 4.45(H) 6.10(H) 4.52(H)  Sodium 135 - 145 mmol/L 138 136 140  Potassium 3.5 - 5.1 mmol/L 4.5 4.6 5.1  Chloride 98 - 111 mmol/L 103 100 110  CO2 22 - 32 mmol/L 23 - 19(L)  Calcium 8.9 - 10.3 mg/dL 8.7(L) - 8.2(L)  Total Protein 6.5 - 8.1 g/dL - - -  Total Bilirubin 0.3 - 1.2 mg/dL - - -  Alkaline Phos 38 - 126 U/L - - -  AST 15 - 41 U/L - - -  ALT 0 - 44 U/L - - -    EGD 06/14/2020: - The entire examined colon is normal on direct and retroflexion views. - No specimens collected  Colonoscopy 06/14/2020: - The entire examined colon is normal on direct and retroflexion views. - No specimens collected  EGD 10/03/2016: - Normal larynx. - Normal esophagus. - Gastric erosions. Biopsied. - Normal examined duodenum. - CHRONIC ACTIVE GASTRITIS WITH HELICOBACTER PYLORI. - NO INTESTINAL METAPLASIA, DYSPLASIA, OR MALIGNANCY. - Post treatment H. Pylori breath test negative 11/09/2016  EGD 08/14/2016: Clean based distal gastric ulcer (likely NSAID related given her heavy use recently). - Mild pan-gastritis, biopsied to check for H. pylori. HELICOBACTER PYLORI GASTRITIS NEGATIVE  FOR ATYPIA OR MALIGNANCY  Past Medical History:  Diagnosis Date   Anemia    Anxiety    Arthritis    "hips" (08/28/2013)   Asthma    Chronic kidney disease    Chronic lower back pain    10-27-13 not a problem at this time   Depression    Esophageal spasm    Frequent UTI    GERD (gastroesophageal reflux disease)    Hepatitis C    genotype 1B   High cholesterol    Hypertension    Migraine    "monthly" (08/28/2013)   Pneumonia    "several times"   Type II diabetes mellitus ([place])    Ulcer    hx gastric ulcers   Past Surgical History:  Procedure Laterality Date   ABDOMINAL HYSTERECTOMY      APPENDECTOMY     AV FISTULA PLACEMENT Left 05/19/2019   Procedure: LEFT ARM Brachiocephalic ARTERIOVENOUS (AV) FISTULA CREATION;  Surgeon: Waynetta Sandy, MD;  Location: [place];  Service: Vascular;  Laterality: Left;   AV FISTULA PLACEMENT Left 07/23/2019   Procedure: LEFT ARM ARTERIOVENOUS (AV) FISTULA CREATION;  Surgeon: Waynetta Sandy, MD;  Location: [place];  Service: Vascular;  Laterality: Left;   BALLOON DILATION N/A 12/29/2012   Procedure: Larrie Kass DILATION;  Surgeon: Inda Castle, MD;  Location: [facility];  Service: Endoscopy;  Laterality: N/A;   BALLOON DILATION N/A 06/25/2013   Procedure: BALLOON DILATION;  Surgeon: Inda Castle, MD;  [facility];  Service: Endoscopy;  Laterality: N/A;   BASCILIC VEIN TRANSPOSITION Left 09/24/2019   Procedure: SECOND STAGE LEFT BASCILIC VEIN TRANSPOSITION;  Surgeon: Waynetta Sandy, MD;  Location: [place];  Service: Vascular;  Laterality: Left;   BOTOX INJECTION  12/29/2012   Procedure: BOTOX INJECTION;  Surgeon: Inda Castle, MD;  Location: [facility];  Service: Endoscopy;;   BOTOX INJECTION N/A 06/25/2013   Procedure: BOTOX INJECTION;  Surgeon: Inda Castle, MD;  [facility]: [facility];  Service: Endoscopy;  Laterality: N/A;   CARDIAC CATHETERIZATION  X 2   /notes 08/28/2013   CARPAL TUNNEL RELEASE Right    CHOLECYSTECTOMY     COLONOSCOPY  04/10/2012   Normal    ESOPHAGEAL MANOMETRY N/A 12/26/2015   Procedure: ESOPHAGEAL MANOMETRY (EM);  Surgeon: Doran Stabler, MD;  [facility]: [facility];  Service: Gastroenterology;  Laterality: N/A;   ESOPHAGOGASTRODUODENOSCOPY N/A 12/29/2012   Procedure: ESOPHAGOGASTRODUODENOSCOPY (EGD);  Surgeon: Inda Castle, MD;  Location: [facility];  Service: Endoscopy;  Laterality: N/A;   ESOPHAGOGASTRODUODENOSCOPY N/A 06/25/2013   Procedure: ESOPHAGOGASTRODUODENOSCOPY (EGD);  Surgeon: Inda Castle, MD;  Location: [facility];  Service: Endoscopy;  Laterality:  N/A;   ESOPHAGOGASTRODUODENOSCOPY N/A 11/03/2013   Procedure: ESOPHAGOGASTRODUODENOSCOPY (EGD);  Surgeon: Inda Castle, MD;  Location: [facility];  Service: Endoscopy;  Laterality: N/A;  Botox - ballon dilation   ESOPHAGOGASTRODUODENOSCOPY N/A 08/14/2016   Procedure: ESOPHAGOGASTRODUODENOSCOPY (EGD);  Surgeon: Milus Banister, MD;  Location[facility];  Service: Endoscopy;  Laterality: N/A;   FOOT FRACTURE SURGERY Right    HEEL SPUR EXCISION Right    TONSILLECTOMY     TUBAL LIGATION      Current Medications, Allergies, Past Medical History, Past Surgical History, Family History and Social History were reviewed in [employer] record.   Review of Systems:   Constitutional: Negative for fever, sweats, chills or weight loss.  Respiratory: Negative for shortness of breath.   Cardiovascular: Negative for chest pain, palpitations  and leg swelling.  Gastrointestinal: See HPI.  Musculoskeletal: Negative for back pain or muscle aches.  Neurological: Negative for dizziness, headaches or paresthesias.   Physical Exam: BP 110/80    Pulse 81    Ht 5\' 5"  (1.651 m)    Wt 239 lb (108.4 kg)    BMI 39.77 kg/m  General: 61 year old female in NAD.  Head: Normocephalic and atraumatic. Eyes: No scleral icterus. Conjunctiva pink . Ears: Normal auditory acuity. Mouth: No ulcers or lesions.  Lungs: Clear throughout to auscultation. Heart: Regular rate and rhythm, no murmur. Abdomen: Soft, nontender and nondistended. No masses or hepatomegaly. Normal bowel sounds x 4 quadrants.  Rectal: Patient declined rectal exam,  Musculoskeletal: Symmetrical with no gross deformities. Extremities: No edema. LUE fistula with + bruit and thrill, a moderate size hematoma above the proximal fistula  Neurological: Alert oriented x 4. No focal deficits.  Psychological: Alert and cooperative. Normal mood and affect  Assessment and Recommendations:  27) 61 year old female with a history of GERD,  GI bleed/gastric ulcer + H. Pylori treated 2018 with with reported black stools with chronic anemia in setting of CKD. No GERD symptoms. Her most recent EGD/colonoscopy 06/2020 were normal. Baseline Hg 10 -11. Hg 8.8 on 1/4 -> Hg 9.5 on 02/24/2021. -Patient to monitor color of stools  -Patient to complete stool heme cards -She is potentially at risk for small bowel AVMs secondary to history of ESRD on HD. EGD and colonoscopy 06/2020 were unrevealing, no evidence of upper GI or colonic AVMs. Consider small bowel enteroscopy at [facility], to discuss further with Dr. Loletha Carrow -Continue follow up labs and IV iron as needed per nephrology  2) CKD on Hemodialysis MWF. LUE hematoma proximal to LUE fistula dialysis access site. -Patient instructed to contact her nephrologist today regarding LUE hematoma. Next dialysis session is 03/15/2021.   4) History of chronic hepatitis C genotype 1b treated with Harvoni x 8 weeks per Roosevelt Locks NP at [facility] in 2018. I was unable to locate a 3 month post treatment Hep C RNA level to verify if SVR was obtained.  -Consider checking  Hep C RNA quant and hepatic panel with next lab draw

## 2021-03-15 NOTE — Progress Notes (Signed)
____________________________________________________________  Attending physician addendum:  Thank you for sending this case to me. I have reviewed the entire note and agree with the plan.  Agree that she is at risk for sources of GI bleeding, though it is not clear if this reported recent black stool has been blood. If she is heme positive, then she would need a small bowel enteroscopy with me in the hospital outpatient endoscopy lab, the schedule for which is currently booked out approximately 2 months.  Scheduling that may be additionally challenging with the need to work around her dialysis days.  If she has brisk GI bleeding with black tarry stool that is symptomatic with lightheadedness or low blood pressure, then she should present to the hospital emergency department.  She should be getting IV iron at dialysis as needed managed by nephrology.  Wilfrid Lund, MD  ____________________________________________________________

## 2021-03-23 ENCOUNTER — Telehealth: Payer: Self-pay | Admitting: Nurse Practitioner

## 2021-03-23 NOTE — Telephone Encounter (Signed)
Excellent

## 2021-03-23 NOTE — Telephone Encounter (Signed)
Beth, pls contact patient and kindly remind her to complete the stool cards she was given at the time of her office visit on 03/14/2021 and verify if she is passing any black stools? If she develops frequent loose black stools with associated feeling light headed or profound fatigue she should go to the ED. Thx

## 2021-03-23 NOTE — Telephone Encounter (Signed)
Patient reports she mailed her cards back today. She denies any dark or black stools and she is feeling well.

## 2021-03-30 ENCOUNTER — Other Ambulatory Visit (INDEPENDENT_AMBULATORY_CARE_PROVIDER_SITE_OTHER): Payer: Medicare Other

## 2021-03-30 DIAGNOSIS — D508 Other iron deficiency anemias: Secondary | ICD-10-CM | POA: Diagnosis not present

## 2021-03-30 DIAGNOSIS — K921 Melena: Secondary | ICD-10-CM

## 2021-03-30 LAB — HEMOCCULT SLIDES (X 3 CARDS)
Fecal Occult Blood: NEGATIVE
OCCULT 1: NEGATIVE
OCCULT 2: NEGATIVE
OCCULT 3: NEGATIVE
OCCULT 4: NEGATIVE
OCCULT 5: NEGATIVE

## 2021-04-13 ENCOUNTER — Other Ambulatory Visit: Payer: Self-pay

## 2021-04-13 DIAGNOSIS — D508 Other iron deficiency anemias: Secondary | ICD-10-CM

## 2021-07-04 ENCOUNTER — Encounter: Payer: Self-pay | Admitting: Podiatry

## 2021-07-04 ENCOUNTER — Ambulatory Visit (INDEPENDENT_AMBULATORY_CARE_PROVIDER_SITE_OTHER): Payer: Medicare Other | Admitting: Podiatry

## 2021-07-04 DIAGNOSIS — E1142 Type 2 diabetes mellitus with diabetic polyneuropathy: Secondary | ICD-10-CM | POA: Diagnosis not present

## 2021-07-04 MED ORDER — GABAPENTIN 300 MG PO CAPS: 300.0000 mg | ORAL_CAPSULE | Freq: Three times a day (TID) | ORAL | 3 refills | Status: DC

## 2021-07-04 NOTE — Progress Notes (Signed)
She presents today for follow-up of her diabetic peripheral neuropathy states that she is having a lot of pain in her feet particularly at night and even around her big toe during the daytime with shoes and socks.  Objective: Vital signs are stable alert oriented x3 per Thornell Mule monofilament she has diminished sensation.  Pulses are palpable no open lesions or wounds.  Assessment: Most likely diabetic peripheral neuropathy.  Plan: Increased her gabapentin to 300 mg 3 times a day.

## 2021-07-06 ENCOUNTER — Ambulatory Visit: Payer: Medicaid Other | Admitting: Podiatry

## 2021-07-18 ENCOUNTER — Ambulatory Visit: Payer: Medicaid Other | Admitting: Podiatry

## 2021-08-17 ENCOUNTER — Ambulatory Visit: Payer: Medicare Other | Admitting: Podiatry

## 2021-09-28 ENCOUNTER — Ambulatory Visit: Payer: Medicare Other | Admitting: Podiatry

## 2021-09-28 DIAGNOSIS — M778 Other enthesopathies, not elsewhere classified: Secondary | ICD-10-CM

## 2021-10-12 ENCOUNTER — Ambulatory Visit (INDEPENDENT_AMBULATORY_CARE_PROVIDER_SITE_OTHER): Payer: Medicare Other | Admitting: Podiatry

## 2021-10-12 ENCOUNTER — Encounter: Payer: Self-pay | Admitting: Podiatry

## 2021-10-12 DIAGNOSIS — E1142 Type 2 diabetes mellitus with diabetic polyneuropathy: Secondary | ICD-10-CM

## 2021-10-12 DIAGNOSIS — M722 Plantar fascial fibromatosis: Secondary | ICD-10-CM

## 2021-10-12 DIAGNOSIS — M778 Other enthesopathies, not elsewhere classified: Secondary | ICD-10-CM

## 2021-10-12 MED ORDER — GABAPENTIN 300 MG PO CAPS
ORAL_CAPSULE | ORAL | 3 refills | Status: DC
Start: 2021-10-12 — End: 2021-10-21

## 2021-10-12 MED ORDER — TRIAMCINOLONE ACETONIDE 40 MG/ML IJ SUSP
20.0000 mg | Freq: Once | INTRAMUSCULAR | Status: AC
  Administered 2021-10-12: 20 mg

## 2021-10-12 NOTE — Progress Notes (Signed)
She presents today for follow-up of her neuropathy states that her left heel is still hurting she states that she still has some burning all the time from her diabetic peripheral neuropathy but the left heel spur is really giving her grief.  She states that the gabapentin does not seem to be helping as much as it should.  She takes 300 mg 3 times a day she states.  Objective: Vital signs stable alert and oriented x3.  Pulses are palpable.  She has no open lesions or wounds.  She has no pain on palpation medial calcaneal tubercle of the right heel however the left heel in the central calcaneal tubercle is very painful.  She has no pain on palpation of the Achilles posterior aspect of the leg foot.  She has good range of motion of the toes.  Assessment: Diabetic peripheral neuropathy and Planter fasciitis left.  Plan: Discussed etiology pathology and surgical therapies at this point were going to increase her gabapentin to 600 mg in the morning 300 mg at lunch 600 mg at bedtime.  I am going to inject her left heel today 20 mg Kenalog 5 mg Marcaine point maximal tenderness.  She tolerated procedure well without complications.  States that it felt much better when she was leaving the office.  Follow-up with her in 1 month to 6 weeks

## 2021-10-17 ENCOUNTER — Inpatient Hospital Stay (HOSPITAL_COMMUNITY)
Admission: EM | Admit: 2021-10-17 | Discharge: 2021-10-21 | DRG: 304 | Disposition: A | Discharge status: Home / Self Care | Payer: Medicare Other | Attending: Internal Medicine | Admitting: Internal Medicine

## 2021-10-17 ENCOUNTER — Other Ambulatory Visit: Payer: Self-pay

## 2021-10-17 ENCOUNTER — Encounter (HOSPITAL_COMMUNITY): Payer: Self-pay | Admitting: Emergency Medicine

## 2021-10-17 ENCOUNTER — Emergency Department (HOSPITAL_COMMUNITY): Payer: Medicare Other

## 2021-10-17 DIAGNOSIS — K567 Ileus, unspecified: Secondary | ICD-10-CM

## 2021-10-17 DIAGNOSIS — F419 Anxiety disorder, unspecified: Secondary | ICD-10-CM | POA: Diagnosis present

## 2021-10-17 DIAGNOSIS — Z820 Family history of epilepsy and other diseases of the nervous system: Secondary | ICD-10-CM

## 2021-10-17 DIAGNOSIS — B182 Chronic viral hepatitis C: Secondary | ICD-10-CM | POA: Diagnosis present

## 2021-10-17 DIAGNOSIS — E7800 Pure hypercholesterolemia, unspecified: Secondary | ICD-10-CM | POA: Diagnosis present

## 2021-10-17 DIAGNOSIS — F32A Depression, unspecified: Secondary | ICD-10-CM | POA: Diagnosis present

## 2021-10-17 DIAGNOSIS — I160 Hypertensive urgency: Secondary | ICD-10-CM | POA: Diagnosis not present

## 2021-10-17 DIAGNOSIS — Z6834 Body mass index (BMI) 34.0-34.9, adult: Secondary | ICD-10-CM

## 2021-10-17 DIAGNOSIS — Z8249 Family history of ischemic heart disease and other diseases of the circulatory system: Secondary | ICD-10-CM

## 2021-10-17 DIAGNOSIS — Z833 Family history of diabetes mellitus: Secondary | ICD-10-CM

## 2021-10-17 DIAGNOSIS — G43909 Migraine, unspecified, not intractable, without status migrainosus: Secondary | ICD-10-CM | POA: Diagnosis present

## 2021-10-17 DIAGNOSIS — G8929 Other chronic pain: Secondary | ICD-10-CM | POA: Diagnosis present

## 2021-10-17 DIAGNOSIS — K529 Noninfective gastroenteritis and colitis, unspecified: Secondary | ICD-10-CM | POA: Diagnosis present

## 2021-10-17 DIAGNOSIS — E8770 Fluid overload, unspecified: Secondary | ICD-10-CM | POA: Diagnosis present

## 2021-10-17 DIAGNOSIS — I248 Other forms of acute ischemic heart disease: Secondary | ICD-10-CM | POA: Diagnosis present

## 2021-10-17 DIAGNOSIS — E669 Obesity, unspecified: Secondary | ICD-10-CM | POA: Diagnosis present

## 2021-10-17 DIAGNOSIS — Z8711 Personal history of peptic ulcer disease: Secondary | ICD-10-CM

## 2021-10-17 DIAGNOSIS — E1165 Type 2 diabetes mellitus with hyperglycemia: Secondary | ICD-10-CM | POA: Diagnosis present

## 2021-10-17 DIAGNOSIS — Z79899 Other long term (current) drug therapy: Secondary | ICD-10-CM

## 2021-10-17 DIAGNOSIS — Z808 Family history of malignant neoplasm of other organs or systems: Secondary | ICD-10-CM

## 2021-10-17 DIAGNOSIS — I169 Hypertensive crisis, unspecified: Secondary | ICD-10-CM

## 2021-10-17 DIAGNOSIS — N186 End stage renal disease: Secondary | ICD-10-CM | POA: Diagnosis present

## 2021-10-17 DIAGNOSIS — E119 Type 2 diabetes mellitus without complications: Secondary | ICD-10-CM

## 2021-10-17 DIAGNOSIS — Z992 Dependence on renal dialysis: Secondary | ICD-10-CM

## 2021-10-17 DIAGNOSIS — E1122 Type 2 diabetes mellitus with diabetic chronic kidney disease: Secondary | ICD-10-CM | POA: Diagnosis present

## 2021-10-17 DIAGNOSIS — Z885 Allergy status to narcotic agent status: Secondary | ICD-10-CM

## 2021-10-17 DIAGNOSIS — K224 Dyskinesia of esophagus: Secondary | ICD-10-CM | POA: Diagnosis present

## 2021-10-17 DIAGNOSIS — Z888 Allergy status to other drugs, medicaments and biological substances status: Secondary | ICD-10-CM

## 2021-10-17 DIAGNOSIS — J45909 Unspecified asthma, uncomplicated: Secondary | ICD-10-CM | POA: Diagnosis present

## 2021-10-17 DIAGNOSIS — D631 Anemia in chronic kidney disease: Secondary | ICD-10-CM | POA: Diagnosis present

## 2021-10-17 DIAGNOSIS — K2970 Gastritis, unspecified, without bleeding: Secondary | ICD-10-CM

## 2021-10-17 DIAGNOSIS — Z20822 Contact with and (suspected) exposure to covid-19: Secondary | ICD-10-CM | POA: Diagnosis present

## 2021-10-17 DIAGNOSIS — R0789 Other chest pain: Secondary | ICD-10-CM | POA: Diagnosis present

## 2021-10-17 DIAGNOSIS — M545 Low back pain, unspecified: Secondary | ICD-10-CM | POA: Diagnosis present

## 2021-10-17 DIAGNOSIS — I120 Hypertensive chronic kidney disease with stage 5 chronic kidney disease or end stage renal disease: Secondary | ICD-10-CM | POA: Diagnosis present

## 2021-10-17 DIAGNOSIS — K219 Gastro-esophageal reflux disease without esophagitis: Secondary | ICD-10-CM | POA: Diagnosis present

## 2021-10-17 DIAGNOSIS — R112 Nausea with vomiting, unspecified: Secondary | ICD-10-CM | POA: Diagnosis present

## 2021-10-17 DIAGNOSIS — Z87440 Personal history of urinary (tract) infections: Secondary | ICD-10-CM

## 2021-10-17 DIAGNOSIS — N271 Small kidney, bilateral: Secondary | ICD-10-CM | POA: Diagnosis present

## 2021-10-17 DIAGNOSIS — R778 Other specified abnormalities of plasma proteins: Secondary | ICD-10-CM | POA: Diagnosis present

## 2021-10-17 HISTORY — DX: Hypertensive crisis, unspecified: I16.9

## 2021-10-17 LAB — HEPATITIS B CORE ANTIBODY, TOTAL: Hep B Core Total Ab: NONREACTIVE

## 2021-10-17 LAB — PROTIME-INR
INR: 1 (ref 0.8–1.2)
Prothrombin Time: 12.6 seconds (ref 11.4–15.2)

## 2021-10-17 LAB — GLUCOSE, CAPILLARY
Glucose-Capillary: 204 mg/dL — ABNORMAL HIGH (ref 70–99)
Glucose-Capillary: 278 mg/dL — ABNORMAL HIGH (ref 70–99)

## 2021-10-17 LAB — HEMOGLOBIN A1C
Hgb A1c MFr Bld: 8.2 % — ABNORMAL HIGH (ref 4.8–5.6)
Mean Plasma Glucose: 188.64 mg/dL

## 2021-10-17 LAB — CBG MONITORING, ED: Glucose-Capillary: 186 mg/dL — ABNORMAL HIGH (ref 70–99)

## 2021-10-17 LAB — CBC
HCT: 29.1 % — ABNORMAL LOW (ref 36.0–46.0)
Hemoglobin: 10 g/dL — ABNORMAL LOW (ref 12.0–15.0)
MCH: 29.9 pg (ref 26.0–34.0)
MCHC: 34.4 g/dL (ref 30.0–36.0)
MCV: 86.9 fL (ref 80.0–100.0)
Platelets: 214 10*3/uL (ref 150–400)
RBC: 3.35 MIL/uL — ABNORMAL LOW (ref 3.87–5.11)
RDW: 17.6 % — ABNORMAL HIGH (ref 11.5–15.5)
WBC: 10.5 10*3/uL (ref 4.0–10.5)
nRBC: 0.2 % (ref 0.0–0.2)

## 2021-10-17 LAB — I-STAT BETA HCG BLOOD, ED (MC, WL, AP ONLY): I-stat hCG, quantitative: <5 m[IU]/mL (ref <5)

## 2021-10-17 LAB — BASIC METABOLIC PANEL
Anion gap: 13 (ref 5–15)
BUN: 25 mg/dL — ABNORMAL HIGH (ref 6–20)
CO2: 41 mmol/L — ABNORMAL HIGH (ref 22–32)
Calcium: 9.1 mg/dL (ref 8.9–10.3)
Chloride: 88 mmol/L — ABNORMAL LOW (ref 98–111)
Creatinine, Ser: 4.66 mg/dL — ABNORMAL HIGH (ref 0.44–1.00)
GFR, Estimated: 10 mL/min — ABNORMAL LOW (ref >60)
Glucose, Bld: 215 mg/dL — ABNORMAL HIGH (ref 70–99)
Potassium: 3.5 mmol/L (ref 3.5–5.1)
Sodium: 142 mmol/L (ref 135–145)

## 2021-10-17 LAB — SARS CORONAVIRUS 2 BY RT PCR: SARS Coronavirus 2 by RT PCR: NEGATIVE

## 2021-10-17 LAB — HEPATITIS C ANTIBODY: HCV Ab: REACTIVE — AB

## 2021-10-17 LAB — TROPONIN I (HIGH SENSITIVITY)
Troponin I (High Sensitivity): 27 ng/L — ABNORMAL HIGH (ref <18)
Troponin I (High Sensitivity): 22 ng/L — ABNORMAL HIGH (ref <18)

## 2021-10-17 LAB — HEPATITIS B SURFACE ANTIGEN: Hepatitis B Surface Ag: NONREACTIVE

## 2021-10-17 LAB — HIV ANTIBODY (ROUTINE TESTING W REFLEX): HIV Screen 4th Generation wRfx: NONREACTIVE

## 2021-10-17 LAB — BRAIN NATRIURETIC PEPTIDE: B Natriuretic Peptide: 366.5 pg/mL — ABNORMAL HIGH (ref 0.0–100.0)

## 2021-10-17 LAB — HEPATITIS B SURFACE ANTIBODY,QUALITATIVE: Hep B S Ab: NONREACTIVE

## 2021-10-17 MED ORDER — HYDRALAZINE HCL 25 MG PO TABS
25.0000 mg | ORAL_TABLET | Freq: Once | ORAL | Status: AC
  Administered 2021-10-17: 25 mg via ORAL
  Filled 2021-10-17: qty 1

## 2021-10-17 MED ORDER — FUROSEMIDE 40 MG PO TABS
80.0000 mg | ORAL_TABLET | Freq: Two times a day (BID) | ORAL | Status: DC
  Administered 2021-10-17 – 2021-10-21 (×6): 80 mg via ORAL
  Filled 2021-10-17 (×6): qty 2

## 2021-10-17 MED ORDER — INSULIN NPH (HUMAN) (ISOPHANE) 100 UNIT/ML ~~LOC~~ SUSP: 15.0000 [IU] | Freq: Two times a day (BID) | SUBCUTANEOUS | Status: DC

## 2021-10-17 MED ORDER — CLONIDINE HCL 0.1 MG PO TABS
0.1000 mg | ORAL_TABLET | Freq: Once | ORAL | Status: AC
  Administered 2021-10-17: 0.1 mg via ORAL
  Filled 2021-10-17: qty 1

## 2021-10-17 MED ORDER — CALCIUM CARBONATE ANTACID 1250 MG/5ML PO SUSP
500.0000 mg | Freq: Four times a day (QID) | ORAL | Status: DC | PRN
  Filled 2021-10-17: qty 5

## 2021-10-17 MED ORDER — HYDRALAZINE HCL 20 MG/ML IJ SOLN
5.0000 mg | INTRAMUSCULAR | Status: DC | PRN
  Administered 2021-10-18 – 2021-10-19 (×2): 5 mg via INTRAVENOUS
  Filled 2021-10-17 (×3): qty 1

## 2021-10-17 MED ORDER — NITROGLYCERIN IN D5W 200-5 MCG/ML-% IV SOLN
0.0000 ug/min | INTRAVENOUS | Status: DC
  Administered 2021-10-17: 5 ug/min via INTRAVENOUS
  Filled 2021-10-17: qty 250

## 2021-10-17 MED ORDER — ONDANSETRON HCL 4 MG PO TABS: 4.0000 mg | ORAL_TABLET | Freq: Four times a day (QID) | ORAL | Status: DC | PRN

## 2021-10-17 MED ORDER — HEPARIN SODIUM (PORCINE) 5000 UNIT/ML IJ SOLN
5000.0000 [IU] | Freq: Three times a day (TID) | INTRAMUSCULAR | Status: DC
  Administered 2021-10-17 – 2021-10-20 (×9): 5000 [IU] via SUBCUTANEOUS
  Filled 2021-10-17 (×11): qty 1

## 2021-10-17 MED ORDER — HYDROXYZINE HCL 25 MG PO TABS: 25.0000 mg | ORAL_TABLET | Freq: Three times a day (TID) | ORAL | Status: DC | PRN

## 2021-10-17 MED ORDER — FUROSEMIDE 10 MG/ML IJ SOLN
80.0000 mg | Freq: Once | INTRAMUSCULAR | Status: AC
  Administered 2021-10-17: 80 mg via INTRAVENOUS
  Filled 2021-10-17: qty 8

## 2021-10-17 MED ORDER — LABETALOL HCL 200 MG PO TABS
300.0000 mg | ORAL_TABLET | Freq: Once | ORAL | Status: AC
  Administered 2021-10-17: 300 mg via ORAL
  Filled 2021-10-17: qty 2

## 2021-10-17 MED ORDER — SORBITOL 70 % SOLN: 30.0000 mL | Status: DC | PRN

## 2021-10-17 MED ORDER — INSULIN ASPART 100 UNIT/ML IJ SOLN
0.0000 [IU] | Freq: Three times a day (TID) | INTRAMUSCULAR | Status: DC
  Administered 2021-10-18 – 2021-10-19 (×2): 1 [IU] via SUBCUTANEOUS
  Administered 2021-10-20: 2 [IU] via SUBCUTANEOUS
  Administered 2021-10-20: 1 [IU] via SUBCUTANEOUS
  Administered 2021-10-21 (×2): 2 [IU] via SUBCUTANEOUS

## 2021-10-17 MED ORDER — DOCUSATE SODIUM 283 MG RE ENEM: 1.0000 | ENEMA | RECTAL | Status: DC | PRN

## 2021-10-17 MED ORDER — GABAPENTIN 300 MG PO CAPS
600.0000 mg | ORAL_CAPSULE | Freq: Two times a day (BID) | ORAL | Status: DC
Start: 2021-10-17 — End: 2021-10-19
  Administered 2021-10-17 – 2021-10-18 (×2): 600 mg via ORAL
  Filled 2021-10-17 (×4): qty 2

## 2021-10-17 MED ORDER — CHLORHEXIDINE GLUCONATE CLOTH 2 % EX PADS
6.0000 | MEDICATED_PAD | Freq: Every day | CUTANEOUS | Status: DC
  Administered 2021-10-18 – 2021-10-20 (×2): 6 via TOPICAL

## 2021-10-17 MED ORDER — BUPRENORPHINE HCL 75 MCG BU FILM: 75.0000 ug | ORAL_FILM | Freq: Two times a day (BID) | BUCCAL | Status: DC

## 2021-10-17 MED ORDER — CHLORHEXIDINE GLUCONATE CLOTH 2 % EX PADS
6.0000 | MEDICATED_PAD | Freq: Every day | CUTANEOUS | Status: DC
  Administered 2021-10-17: 6 via TOPICAL

## 2021-10-17 MED ORDER — KETOROLAC TROMETHAMINE 0.5 % OP SOLN
1.0000 [drp] | Freq: Four times a day (QID) | OPHTHALMIC | Status: DC
  Administered 2021-10-17 – 2021-10-21 (×9): 1 [drp] via OPHTHALMIC
  Filled 2021-10-17: qty 5

## 2021-10-17 MED ORDER — SODIUM CHLORIDE 0.9% FLUSH
3.0000 mL | Freq: Two times a day (BID) | INTRAVENOUS | Status: DC
  Administered 2021-10-17 – 2021-10-20 (×6): 3 mL via INTRAVENOUS
  Administered 2021-10-21: 10 mL via INTRAVENOUS

## 2021-10-17 MED ORDER — NEPRO/CARBSTEADY PO LIQD: 237.0000 mL | Freq: Three times a day (TID) | ORAL | Status: DC | PRN

## 2021-10-17 MED ORDER — CYCLOBENZAPRINE HCL 10 MG PO TABS: 10.0000 mg | ORAL_TABLET | Freq: Two times a day (BID) | ORAL | Status: DC | PRN

## 2021-10-17 MED ORDER — ALPRAZOLAM 0.25 MG PO TABS: 0.2500 mg | ORAL_TABLET | Freq: Three times a day (TID) | ORAL | Status: DC | PRN

## 2021-10-17 MED ORDER — ACETAMINOPHEN 650 MG RE SUPP: 650.0000 mg | Freq: Four times a day (QID) | RECTAL | Status: DC | PRN

## 2021-10-17 MED ORDER — ZOLPIDEM TARTRATE 5 MG PO TABS
5.0000 mg | ORAL_TABLET | Freq: Every evening | ORAL | Status: DC | PRN
  Administered 2021-10-17 – 2021-10-20 (×2): 5 mg via ORAL
  Filled 2021-10-17 (×2): qty 1

## 2021-10-17 MED ORDER — AMITRIPTYLINE HCL 10 MG PO TABS
10.0000 mg | ORAL_TABLET | Freq: Every day | ORAL | Status: DC
  Administered 2021-10-17 – 2021-10-20 (×4): 10 mg via ORAL
  Filled 2021-10-17 (×6): qty 1

## 2021-10-17 MED ORDER — INSULIN NPH (HUMAN) (ISOPHANE) 100 UNIT/ML ~~LOC~~ SUSP
15.0000 [IU] | Freq: Two times a day (BID) | SUBCUTANEOUS | Status: DC
  Administered 2021-10-17 – 2021-10-19 (×3): 15 [IU] via SUBCUTANEOUS
  Filled 2021-10-17 (×2): qty 10

## 2021-10-17 MED ORDER — CAMPHOR-MENTHOL 0.5-0.5 % EX LOTN: 1.0000 | TOPICAL_LOTION | Freq: Three times a day (TID) | CUTANEOUS | Status: DC | PRN

## 2021-10-17 MED ORDER — LABETALOL HCL 200 MG PO TABS
300.0000 mg | ORAL_TABLET | Freq: Two times a day (BID) | ORAL | Status: DC
  Administered 2021-10-17 – 2021-10-21 (×6): 300 mg via ORAL
  Filled 2021-10-17 (×7): qty 1

## 2021-10-17 MED ORDER — GABAPENTIN 300 MG PO CAPS: 300.0000 mg | ORAL_CAPSULE | Freq: Three times a day (TID) | ORAL | Status: DC

## 2021-10-17 MED ORDER — HYDRALAZINE HCL 25 MG PO TABS
25.0000 mg | ORAL_TABLET | Freq: Three times a day (TID) | ORAL | Status: DC
Start: 2021-10-17 — End: 2021-10-18
  Administered 2021-10-17 – 2021-10-18 (×2): 25 mg via ORAL
  Filled 2021-10-17 (×2): qty 1

## 2021-10-17 MED ORDER — PREDNISOLONE ACETATE 1 % OP SUSP
1.0000 [drp] | Freq: Three times a day (TID) | OPHTHALMIC | Status: DC
Start: 2021-10-17 — End: 2021-10-21
  Administered 2021-10-17 – 2021-10-21 (×8): 1 [drp] via OPHTHALMIC
  Filled 2021-10-17: qty 5

## 2021-10-17 MED ORDER — ACETAMINOPHEN 325 MG PO TABS
650.0000 mg | ORAL_TABLET | Freq: Four times a day (QID) | ORAL | Status: DC | PRN
  Administered 2021-10-17 – 2021-10-18 (×4): 650 mg via ORAL
  Filled 2021-10-17 (×4): qty 2

## 2021-10-17 MED ORDER — CLONIDINE HCL 0.1 MG PO TABS: 0.1000 mg | ORAL_TABLET | Freq: Two times a day (BID) | ORAL | Status: DC

## 2021-10-17 MED ORDER — ASPIRIN 81 MG PO CHEW
162.0000 mg | CHEWABLE_TABLET | Freq: Once | ORAL | Status: AC
  Administered 2021-10-17: 162 mg via ORAL
  Filled 2021-10-17: qty 2

## 2021-10-17 MED ORDER — CALCITRIOL 0.5 MCG PO CAPS
1.0000 ug | ORAL_CAPSULE | ORAL | Status: DC
  Administered 2021-10-18 – 2021-10-20 (×2): 1 ug via ORAL
  Filled 2021-10-17 (×2): qty 2

## 2021-10-17 MED ORDER — GABAPENTIN 300 MG PO CAPS
300.0000 mg | ORAL_CAPSULE | Freq: Every day | ORAL | Status: DC
Start: 2021-10-17 — End: 2021-10-19
  Administered 2021-10-18: 300 mg via ORAL
  Filled 2021-10-17: qty 1

## 2021-10-17 MED ORDER — ONDANSETRON HCL 4 MG/2ML IJ SOLN
4.0000 mg | Freq: Four times a day (QID) | INTRAMUSCULAR | Status: DC | PRN
  Administered 2021-10-19 (×2): 4 mg via INTRAVENOUS
  Filled 2021-10-17 (×2): qty 2

## 2021-10-17 MED ORDER — ALBUTEROL SULFATE (2.5 MG/3ML) 0.083% IN NEBU
3.0000 mL | INHALATION_SOLUTION | RESPIRATORY_TRACT | Status: DC | PRN
Start: 2021-10-17 — End: 2021-10-21

## 2021-10-17 MED ORDER — CLONIDINE HCL 0.2 MG PO TABS
0.2000 mg | ORAL_TABLET | Freq: Two times a day (BID) | ORAL | Status: DC
  Administered 2021-10-17 – 2021-10-18 (×2): 0.2 mg via ORAL
  Filled 2021-10-17 (×4): qty 1

## 2021-10-17 NOTE — Consult Note (Signed)
Renal Service Consult Note [facility]  [name] [name] 10/17/2021 Sol Blazing, MD Requesting Physician: Dr. Lorin Mercy  Reason for Consult: ESRD pt w/ HTN'sive urgency HPI: The patient is a 61 y.o. year-old w/ hx of anemia, anixety, asthma, ESRD on HD, chronic back pain, UTI's, HL, HTN, DM2 who presented to ED w/ SOB and chest pain, onset last night. Gets HD on MWF, last HD was yesterday. In ED BP's were high 230s/ 90s initially. K+ 3.5, creat 4.6, Na 142.  CXR showes possible early IS edema. Hb 10, WBC 10k.  Trops were negative. CP did not improve w/ asa, tylenol, inhaler. No hx CAD. Pt was started on IV ntg gtt and rec'd po meds clonidine/ hydralazine/ labetalol (here home meds). Pt will be admitted for HTN'sive urgency. We are asked to see for dialysis.   Pt seen in ED. Pt states she has been losing wt by eating less at rec's of her doctors. She eats less so she drinks more to take its place. We reviewed her last 2 wks of HD and she routine comes w/ idwg of 3-7kg and did not get close to dry wt in the last 2 wks. We discussed the ideas that come along w/ volume control for esrd pts at length.   ROS - denies CP, no joint pain, no HA, no blurry vision, no rash, no diarrhea, no nausea/ vomiting, no dysuria, no difficulty voiding   Past Medical History  Past Medical History:  Diagnosis Date   Anemia    Anxiety    Arthritis    "hips" (08/28/2013)   Asthma    Chronic kidney disease    Chronic lower back pain    10-27-13 not a problem at this time   Depression    Esophageal spasm    Frequent UTI    GERD (gastroesophageal reflux disease)    Hepatitis C    genotype 1B   High cholesterol    Hypertension    Migraine    "monthly" (08/28/2013)   Pneumonia    "several times"   Type II diabetes mellitus ([place])    Ulcer    hx gastric ulcers   Past Surgical History  Past Surgical History:  Procedure Laterality Date   ABDOMINAL HYSTERECTOMY     APPENDECTOMY     AV FISTULA  PLACEMENT Left 05/19/2019   Procedure: LEFT ARM Brachiocephalic ARTERIOVENOUS (AV) FISTULA CREATION;  Surgeon: Waynetta Sandy, MD;  Location: [place];  Service: Vascular;  Laterality: Left;   AV FISTULA PLACEMENT Left 07/23/2019   Procedure: LEFT ARM ARTERIOVENOUS (AV) FISTULA CREATION;  Surgeon: Waynetta Sandy, MD;  Location: [place];  Service: Vascular;  Laterality: Left;   BALLOON DILATION N/A 12/29/2012   Procedure: Larrie Kass DILATION;  Surgeon: Inda Castle, MD;  [facility]: [facility];  Service: Endoscopy;  Laterality: N/A;   BALLOON DILATION N/A 06/25/2013   Procedure: BALLOON DILATION;  Surgeon: Inda Castle, MD;  Location: [facility];  Service: Endoscopy;  Laterality: N/A;   BASCILIC VEIN TRANSPOSITION Left 09/24/2019   Procedure: SECOND STAGE LEFT BASCILIC VEIN TRANSPOSITION;  Surgeon: Waynetta Sandy, MD;  Location: [place];  Service: Vascular;  Laterality: Left;   BOTOX INJECTION  12/29/2012   Procedure: BOTOX INJECTION;  Surgeon: Inda Castle, MD;  [facility];  Service: Endoscopy;;   BOTOX INJECTION N/A 06/25/2013   Procedure: BOTOX INJECTION;  Surgeon: Inda Castle, MD;  [facility]: [facility];  Service: Endoscopy;  Laterality: N/A;  CARDIAC CATHETERIZATION  X 2   /notes 08/28/2013   CARPAL TUNNEL RELEASE Right    CHOLECYSTECTOMY     COLONOSCOPY  04/10/2012   Normal    ESOPHAGEAL MANOMETRY N/A 12/26/2015   Procedure: ESOPHAGEAL MANOMETRY (EM);  Surgeon: Doran Stabler, MD;  [facility];  Service: Gastroenterology;  Laterality: N/A;   ESOPHAGOGASTRODUODENOSCOPY N/A 12/29/2012   Procedure: ESOPHAGOGASTRODUODENOSCOPY (EGD);  Surgeon: Inda Castle, MD;  Location: [facility];  Service: Endoscopy;  Laterality: N/A;   ESOPHAGOGASTRODUODENOSCOPY N/A 06/25/2013   Procedure: ESOPHAGOGASTRODUODENOSCOPY (EGD);  Surgeon: Inda Castle, MD;  Location: [facility];  Service: Endoscopy;  Laterality: N/A;    ESOPHAGOGASTRODUODENOSCOPY N/A 11/03/2013   Procedure: ESOPHAGOGASTRODUODENOSCOPY (EGD);  Surgeon: Inda Castle, MD;  Location: [facility];  Service: Endoscopy;  Laterality: N/A;  Botox - ballon dilation   ESOPHAGOGASTRODUODENOSCOPY N/A 08/14/2016   Procedure: ESOPHAGOGASTRODUODENOSCOPY (EGD);  Surgeon: Milus Banister, MD;  Location: [facility];  Service: Endoscopy;  Laterality: N/A;   FOOT FRACTURE SURGERY Right    HEEL SPUR EXCISION Right    TONSILLECTOMY     TUBAL LIGATION     Family History  Family History  Problem Relation Age of Onset   Hypertension Mother    Alzheimer's disease Father    Throat cancer Sister 18       died at age 31   Diabetes Brother    Colon cancer Neg Hx    Rectal cancer Neg Hx    Stomach cancer Neg Hx    Social History  reports that she has never smoked. She has never used smokeless tobacco. She reports that she does not drink alcohol and does not use drugs. Allergies  Allergies  Allergen Reactions   Hydrochlorothiazide Itching   Amlodipine     Foot swelling/chest pain    Duloxetine     Hallucinations    Lisinopril Cough   Metformin     Unknown reaction   Codeine Itching    No hives or rash, takes benadryl   Dilaudid [Hydromorphone Hcl] Itching    "mild itching hrs after dilaudid given"   Home medications Prior to Admission medications   Medication Sig Start Date End Date Taking? Authorizing Provider  acetaminophen (TYLENOL) 500 MG tablet Take 1,000 mg by mouth 2 (two) times daily.    [provider]  albuterol (VENTOLIN HFA) 108 (90 Base) MCG/ACT inhaler Inhale 2 puffs into the lungs every 4 (four) hours as needed for wheezing or shortness of breath.     [provider]  ALPRAZolam Duanne Moron) 0.25 MG tablet Take 0.25 mg by mouth 3 (three) times daily as needed. 04/07/21   [provider]  BELBUCA 75 MCG FILM SMARTSIG:1 Strip(s) By Mouth Every 12 Hours 05/03/21   [provider]  cloNIDine (CATAPRES) 0.1 MG  tablet Take 0.1-0.2 mg by mouth 2 (two) times daily. 06/15/21   [provider]  cyclobenzaprine (FLEXERIL) 10 MG tablet Take 10 mg by mouth 2 (two) times daily as needed. 03/21/21   [provider]  furosemide (LASIX) 80 MG tablet Take 80 mg by mouth 2 (two) times daily.  07/17/19   [provider]  gabapentin (NEURONTIN) 300 MG capsule Take 2 AM, 1 PM, 2 QHS 10/12/21   Hyatt, Max T, DPM  glimepiride (AMARYL) 4 MG tablet Take 4 mg by mouth in the morning and at bedtime.     [provider]  HUMULIN N 100 UNIT/ML injection Inject 15 Units into the skin 2 (  two) times daily. 06/26/18   [provider]  hydrALAZINE (APRESOLINE) 25 MG tablet SMARTSIG:1 Tablet(s) By Mouth Every 12 Hours 05/11/21   [provider]  ketorolac (ACULAR) 0.5 % ophthalmic solution 1 drop 4 (four) times daily. 05/09/21   [provider]  labetalol (NORMODYNE) 300 MG tablet Take 300 mg by mouth 2 (two) times daily. 05/04/21   [provider]     Vitals:   10/17/21 1015 10/17/21 1045 10/17/21 1100 10/17/21 1121  BP: (!) 202/101 (!) 179/91 (!) 187/80   Pulse: 64 62 61 66  Resp: 17 15 15    Temp:      TempSrc:      SpO2: 100% 100% 96%    Exam Gen alert, no distress No rash, cyanosis or gangrene Sclera anicteric, throat clear  No jvd or bruits Chest clear bilat to bases, no rales/ wheezing RRR no MRG Abd soft ntnd no mass or ascites +bs GU defer MS no joint effusions or deformity Ext no LE or UE edema, no wounds or ulcers Neuro is alert, Ox 3 , nf    LUA AVF+bruit    Home meds include - alprazolam, belbuca, clonidine 0.1-0.2 qd, furosemide 80 bid, gabapentin 300 5x per day, glimepiride, humulin insulin, hydralazine 25 bid, labetalol 300 bid, prns/ vits/ supps   OP HD: MWF [place] 4h  400/500  93.2kg  3k/2.5Ca bath  P2   Hep none  LUA AVF - last HD 9/4 post HD 95.4kg - high wt gains 3-7 kg - mircera 60 q2, last 9/4, due 9/18 - calcitriol 1.0 ug tiw  po   CXR 9/5 - IMPRESSION: Mild cardiomegaly with mild vascular congestion and interstitial edema.  Assessment/ Plan: HTN'sive urgency - pt here w/ uncontrolled BP, may be related to her large wt gains. Records show she is frequently coming in 4-7kg up and hasn't been getting to her dry wt because of high wt gains. I tried to explain that not drinking a lot of fluids and/or eating lots of ice is the best way to keep her fluid wt's down and of not having cramping w/ HD. Plan is for HD her today off schedule. If still needed will plan HD here tomorrow as well, or possibly she can go to OP HD tomorrow.  ESRD - on HD MWF. HD today as above.  Vol overload - need a standing wt, has not been getting to dry wt in OP setting and idwg's are high. Have d/w pt as above.  Anemia esrd - Hb 10, no esa needs MBD ckd - Ca in range, cont po vdra w/ hd.     Kelly Splinter  MD 10/17/2021, 11:24 AM Recent Labs  Lab 10/17/21 0207  HGB 10.0*  CALCIUM 9.1  CREATININE 4.66*  K 3.5

## 2021-10-17 NOTE — ED Provider Notes (Signed)
[place] [facility] Provider Note   CSN: [id number] Arrival date & time: 10/17/21  0155     History CKD,DM,Depression,HTN Chief Complaint  Patient presents with   Chest Pain    [name] [name] is a 61 y.o. female.  61 y.o female with a PMH of ESRD on dialysis MWF last dialyzed yesterday presents to the ED with a chief complaint of sharp, pressure "like a heavy feeling" chest pain x yesterday. Pain began around 7 pm when patient tried to lay down in bed. She reports the pain was exacerbated with leg laying flat, alleviated with sitting up. However, when she tried to ambulate she began to feel very short of breath. She tried ASA, tylenol, inhaler without any improvement in symptoms. Her pain level is 7/10 despite all treatments tried. No fever, no cough, no leg swelling. No prior hx of CAD, or prior history of blood clots.   The history is provided by the patient and medical records.  Chest Pain Pain location:  Substernal area Pain quality: pressure   Pain radiates to:  Does not radiate Pain severity:  Moderate Onset quality:  Sudden Duration:  1 day Context: breathing   Relieved by:  Certain positions Worsened by:  Certain positions Ineffective treatments:  Aspirin, leaning forward and certain positions Associated symptoms: shortness of breath   Associated symptoms: no abdominal pain, no back pain, no dizziness, no fever, no headache, no nausea, no palpitations and no vomiting   Risk factors: diabetes mellitus and hypertension   Risk factors: no coronary artery disease, not female, no prior DVT/PE and no smoking        Home Medications Prior to Admission medications   Medication Sig Start Date End Date Taking? Authorizing Provider  acetaminophen (TYLENOL) 500 MG tablet Take 1,000 mg by mouth 2 (two) times daily.    [provider]  albuterol (VENTOLIN HFA) 108 (90 Base) MCG/ACT inhaler Inhale 2 puffs into the lungs every 4 (four) hours as  needed for wheezing or shortness of breath.     [provider]  ALPRAZolam Duanne Moron) 0.25 MG tablet Take 0.25 mg by mouth 3 (three) times daily as needed. 04/07/21   [provider]  BELBUCA 75 MCG FILM SMARTSIG:1 Strip(s) By Mouth Every 12 Hours 05/03/21   [provider]  cloNIDine (CATAPRES) 0.1 MG tablet Take 0.1-0.2 mg by mouth 2 (two) times daily. 06/15/21   [provider]  cyclobenzaprine (FLEXERIL) 10 MG tablet Take 10 mg by mouth 2 (two) times daily as needed. 03/21/21   [provider]  furosemide (LASIX) 80 MG tablet Take 80 mg by mouth 2 (two) times daily.  07/17/19   [provider]  gabapentin (NEURONTIN) 300 MG capsule Take 2 AM, 1 PM, 2 QHS 10/12/21   Hyatt, Max T, DPM  glimepiride (AMARYL) 4 MG tablet Take 4 mg by mouth in the morning and at bedtime.     [provider]  HUMULIN N 100 UNIT/ML injection Inject 15 Units into the skin 2 (two) times daily. 06/26/18   [provider]  hydrALAZINE (APRESOLINE) 25 MG tablet SMARTSIG:1 Tablet(s) By Mouth Every 12 Hours 05/11/21   [provider]  ketorolac (ACULAR) 0.5 % ophthalmic solution 1 drop 4 (four) times daily. 05/09/21   [provider]  labetalol (NORMODYNE) 300 MG tablet Take 300 mg by mouth 2 (two) times daily. 05/04/21   [provider]      Allergies    Hydrochlorothiazide, Amlodipine,  Duloxetine, Lisinopril, Metformin, Codeine, and Dilaudid [hydromorphone hcl]    Review of Systems   Review of Systems  Constitutional:  Negative for fever.  Respiratory:  Positive for shortness of breath.   Cardiovascular:  Positive for chest pain. Negative for palpitations and leg swelling.  Gastrointestinal:  Negative for abdominal pain, nausea and vomiting.  Genitourinary:  Negative for flank pain.  Musculoskeletal:  Negative for back pain.  Neurological:  Negative for dizziness, light-headedness and headaches.  All other systems reviewed and are  negative.   Physical Exam Updated Vital Signs BP (!) 177/79   Pulse 63   Temp 97.9 F (36.6 C) (Oral)   Resp 14   SpO2 97%  Physical Exam Vitals and nursing note reviewed.  Constitutional:      Appearance: She is well-developed. She is not ill-appearing.  HENT:     Head: Normocephalic and atraumatic.  Cardiovascular:     Rate and Rhythm: Normal rate.     Comments: No BL leg pitting edema.No calf tenderness.  Pulmonary:     Breath sounds: Decreased breath sounds present. No wheezing.     Comments: Swallow inspirations.  Chest:     Chest wall: Tenderness present.     Comments: TTP along the substernal area.  Abdominal:     Palpations: Abdomen is soft. There is no mass.  Musculoskeletal:     Cervical back: Normal range of motion and neck supple.     Right lower leg: No edema.     Left lower leg: No edema.  Skin:    General: Skin is warm and dry.  Neurological:     Mental Status: She is alert and oriented to person, place, and time.     ED Results / Procedures / Treatments   Labs (all labs ordered are listed, but only abnormal results are displayed) Labs Reviewed  BASIC METABOLIC PANEL - Abnormal; Notable for the following components:      Result Value   Chloride 88 (*)    CO2 41 (*)    Glucose, Bld 215 (*)    BUN 25 (*)    Creatinine, Ser 4.66 (*)    GFR, Estimated 10 (*)    All other components within normal limits  CBC - Abnormal; Notable for the following components:   RBC 3.35 (*)    Hemoglobin 10.0 (*)    HCT 29.1 (*)    RDW 17.6 (*)    All other components within normal limits  BRAIN NATRIURETIC PEPTIDE - Abnormal; Notable for the following components:   B Natriuretic Peptide 366.5 (*)    All other components within normal limits  CBG MONITORING, ED - Abnormal; Notable for the following components:   Glucose-Capillary 186 (*)    All other components within normal limits  TROPONIN I (HIGH SENSITIVITY) - Abnormal; Notable for the following components:    Troponin I (High Sensitivity) 27 (*)    All other components within normal limits  TROPONIN I (HIGH SENSITIVITY) - Abnormal; Notable for the following components:   Troponin I (High Sensitivity) 22 (*)    All other components within normal limits  SARS CORONAVIRUS 2 BY RT PCR  PROTIME-INR  HIV ANTIBODY (ROUTINE TESTING W REFLEX)  HEMOGLOBIN A1C  HEPATITIS B SURFACE ANTIBODY,QUALITATIVE  HEPATITIS B SURFACE ANTIBODY, QUANTITATIVE  HEPATITIS B CORE ANTIBODY, TOTAL  HEPATITIS C ANTIBODY  HEPATITIS B SURFACE ANTIGEN  I-STAT BETA HCG BLOOD, ED ([facility], [facility], [facility] ONLY)    EKG EKG Interpretation  Date/Time:  Tuesday  October 17 2021 02:07:39 EDT Ventricular Rate:  68 PR Interval:  162 QRS Duration: 78 QT Interval:  454 QTC Calculation: 482 R Axis:   56 Text Interpretation: Normal sinus rhythm ST & T wave abnormality, consider lateral ischemia Prolonged QT Abnormal ECG When compared with ECG of 21-Mar-2020 13:16, PREVIOUS ECG IS PRESENT Since last tracing rate slower Confirmed by Thayer Jew [phone]) on 10/17/2021 6:56:11 AM  Radiology DG Chest 2 View  Result Date: 10/17/2021 CLINICAL DATA:  Chest pain. EXAM: CHEST - 2 VIEW COMPARISON:  Chest radiograph dated 03/21/2020. FINDINGS: There is mild cardiomegaly with mild vascular congestion and interstitial edema. No focal consolidation, pleural effusion or pneumothorax. No acute osseous pathology. IMPRESSION: Mild cardiomegaly with mild vascular congestion and interstitial edema. Electronically Signed   By: Anner Crete M.D.   On: 10/17/2021 02:41    Procedures Procedures    Medications Ordered in ED Medications  furosemide (LASIX) tablet 80 mg (has no administration in time range)  hydrALAZINE (APRESOLINE) tablet 25 mg (has no administration in time range)  labetalol (NORMODYNE) tablet 300 mg (has no administration in time range)  insulin NPH Human (NOVOLIN N) injection 15 Units (has no administration in time range)   cyclobenzaprine (FLEXERIL) tablet 10 mg (has no administration in time range)  gabapentin (NEURONTIN) capsule 300-600 mg (has no administration in time range)  albuterol (VENTOLIN HFA) 108 (90 Base) MCG/ACT inhaler 2 puff (has no administration in time range)  heparin injection 5,000 Units (has no administration in time range)  sodium chloride flush (NS) 0.9 % injection 3 mL (has no administration in time range)  hydrALAZINE (APRESOLINE) injection 5 mg (has no administration in time range)  furosemide (LASIX) injection 80 mg (has no administration in time range)  insulin aspart (novoLOG) injection 0-6 Units (has no administration in time range)  acetaminophen (TYLENOL) tablet 650 mg (has no administration in time range)    Or  acetaminophen (TYLENOL) suppository 650 mg (has no administration in time range)  zolpidem (AMBIEN) tablet 5 mg (has no administration in time range)  sorbitol 70 % solution 30 mL (has no administration in time range)  docusate sodium (ENEMEEZ) enema 283 mg (has no administration in time range)  ondansetron (ZOFRAN) tablet 4 mg (has no administration in time range)    Or  ondansetron (ZOFRAN) injection 4 mg (has no administration in time range)  camphor-menthol (SARNA) lotion 1 Application (has no administration in time range)    And  hydrOXYzine (ATARAX) tablet 25 mg (has no administration in time range)  calcium carbonate (dosed in mg elemental calcium) suspension 500 mg of elemental calcium (has no administration in time range)  feeding supplement (NEPRO CARB STEADY) liquid 237 mL (has no administration in time range)  Chlorhexidine Gluconate Cloth 2 % PADS 6 each (has no administration in time range)  cloNIDine (CATAPRES) tablet 0.2 mg (has no administration in time range)  hydrALAZINE (APRESOLINE) tablet 25 mg (25 mg Oral Given 10/17/21 0723)  aspirin chewable tablet 162 mg (162 mg Oral Given 10/17/21 0723)  cloNIDine (CATAPRES) tablet 0.1 mg (0.1 mg Oral Given  10/17/21 0905)  labetalol (NORMODYNE) tablet 300 mg (300 mg Oral Given 10/17/21 1121)    ED Course/ Medical Decision Making/ A&P                           Medical Decision Making Amount and/or Complexity of Data Reviewed Labs: ordered. Radiology: ordered.  Risk OTC drugs. Prescription drug  management. Decision regarding hospitalization.     This patient presents to the ED for concern of chest pain, this involves a number of treatment options, and is a complaint that carries with it a high risk of complications and morbidity.  The differential diagnosis includes ACS, PE, URI, versus Asthma exacerbation.   Co morbidities: Discussed in HPI   Brief History:  Patient here with a prior history of CKD complaining of chest pressure that began around 7 PM last night while she was trying to go to sleep exacerbated with lying flat alleviated with sitting.  Compliance with her Lasix, reports no leg swelling in her legs.  Did try an inhaler, Tylenol without much improvement in her pain.  EMR reviewed including pt PMHx, past surgical history and past visits to ER.   See HPI for more details   Lab Tests:  I ordered and independently interpreted labs.  The pertinent results include:    Labs notable for elevated creatinine at 5.6 however it is within her baseline.  She did receive dialysis yesterday.  Normal potassium, rest of her electrolytes are within her range.  CBC with no leukocytosis, hemoglobin is 10 however within her baseline.  PT and INR are normal she is not anticoagulated.  First troponin was 27 however delta was obtained which is now 22.   Imaging Studies:  Xray of the chest showed: Mild cardiomegaly with mild vascular congestion and interstitial  Cardiac Monitoring:  The patient was maintained on a cardiac monitor.  I personally viewed and interpreted the cardiac monitored which showed an underlying rhythm of: NSR 68 EKG non-ischemic   Medicines ordered:  I ordered  medication including hydralazine, clonidine  for BP control Given ASA for chest pain with no improvement. Reevaluation of the patient after these medicines showed that the patient stayed the same I have reviewed the patients home medicines and have made adjustments as needed  -Patient started on a Nitroglycerin drip due to continued chest pain along with elevated BP  Reevaluation:  After the interventions noted above I re-evaluated patient and found that they have :stayed the same   Social Determinants of Health:  The patient's social determinants of health were a factor in the care of this patient    Problem List / ED Course:  Patient here with chest pain x yesterday, similar episode prior to being started on dialysis, does report heavy feeling on her chest specially when she tries to lay flat.  Labs are benign, BNP was added by me.  She does not look fluid overloaded on exam no pitting edema, she does take shallow breaths with inspiration.  No hypoxia or tachycardia on arrival.  Was hypertensive, given home meds without much improvement. Review of records by me with a prior ECHO from 07/2018  1. The left ventricle has normal systolic function with an ejection fraction of 60-65% .  Similar visit 2021 prior to starting dialysis with the same complaint however noted cause of sob was asthma then, but she did try inhaler today and no improvement. BNP slightly elevated today but without any signs of fluid overload on exam.  X-ray does show some vascular congestion. After failing multiple oral antihypertensives that she also takes at home, she was started on a nitroglycerin drip and will require admission for hypertensive urgency. After records reviewed, I do see a similar vision in 07/02/2019 for the same presentation along with chest pain and elevate BP in the 782'N systolic.    Dispostion:  After consideration  of the diagnostic results and the patients response to treatment, I feel that  the patent would benefit from admission via hospitalist. Spoke to Dr. Lorin Mercy, appreciate her help. Patient admitted for further management.   Portions of this note were generated with [employer]. Dictation errors may occur despite best attempts at proofreading.  Final Clinical Impression(s) / ED Diagnoses Final diagnoses:  Atypical chest pain  Hypertensive urgency    Rx / DC Orders ED Discharge Orders     None         Janeece Fitting, PA-C 10/17/21 1229    Merryl Hacker, MD 10/18/21 [phone]

## 2021-10-17 NOTE — ED Triage Notes (Signed)
Patient reports central chest pain with SOB onset last night , hemodialysis q Mon/Wed/Fri , did not missed any treatment this week .

## 2021-10-17 NOTE — Plan of Care (Signed)
°  Problem: Activity: °Goal: Risk for activity intolerance will decrease °Outcome: Progressing °  °Problem: Elimination: °Goal: Will not experience complications related to urinary retention °Outcome: Progressing °  °Problem: Pain Managment: °Goal: General experience of comfort will improve °Outcome: Progressing °  °Problem: Safety: °Goal: Ability to remain free from injury will improve °Outcome: Progressing °  °

## 2021-10-17 NOTE — Progress Notes (Addendum)
Hemodialysis- Patient tolerated well. Unable to meet UF goal due to large bp drop with residual chest pain and cramping x2. Chest pain relieved with oxygen administration, reduction in bfr, and saline bolus/uf off. Patient reports continuing to cramp even with UF off. Treatment stopped with 18 minutes remaining due to cramping. All blood returned with additional 100cc saline-cramping was resolved. Standing weight obtained post treatment = 95.8kg. BP is much improved at 162/70. Patient was also given snack. 1950mL removed with dialysis. Patient does have additional dialysis orders for tomorrow.

## 2021-10-17 NOTE — H&P (Signed)
History and Physical    Patient: [name] [name] XIP:[id number] DOB: 1961-02-06 DOA: 10/17/2021 DOS: the patient was seen and examined on 10/17/2021 PCP: [name], MD  Patient coming from: Home - lives with nephew and brother-in-law; NOK: Mother, [phone]   Chief Complaint: CP  HPI: [name] [name] is a 61 y.o. female with medical history significant of ESRD on MWF HD; HTN; HLD; and DM presenting with CP.  She reports that last night she went to bed and noticed a bit of headache and chest pain like a lot of weight was on her.  She took her medicine.  She got up to the restroom and was SOB.  She tried to lie down and was very SOB with CP.  Her BP was high, 270/116.  She took an extra Clonidine but was unable to lie down because she was too SOB.  She took some Tylenol for headache, then ASA and nothing helped.  She has a headache now and her chest still hurts.  She doesn't feel SOB anymore.  She had full HD yesterday, reports that she mostly goes.  She hasn't missed a single session all summer, last missed a day in April.      ER Course:  CP last night, worse while flat.  No better with ASA, better when sitting up.  CXR with edema.  Given ASA.  BP elevated.  Started on NTG drip for HTN urgency.  HS troponin 27, 22.  Usual HD is MWF, went yesterday.       Review of Systems: As mentioned in the history of present illness. All other systems reviewed and are negative. Past Medical History:  Diagnosis Date   Anemia    Anxiety    Arthritis    "hips" (08/28/2013)   Asthma    Chronic kidney disease    Chronic lower back pain    10-27-13 not a problem at this time   Depression    Esophageal spasm    Frequent UTI    GERD (gastroesophageal reflux disease)    Hepatitis C    genotype 1B   High cholesterol    Hypertension    Migraine    "monthly" (08/28/2013)   Pneumonia    "several times"   Type II diabetes mellitus ([place])    Ulcer    hx gastric ulcers   Past Surgical History:   Procedure Laterality Date   ABDOMINAL HYSTERECTOMY     APPENDECTOMY     AV FISTULA PLACEMENT Left 05/19/2019   Procedure: LEFT ARM Brachiocephalic ARTERIOVENOUS (AV) FISTULA CREATION;  Surgeon: Waynetta Sandy, MD;  Location: [place];  Service: Vascular;  Laterality: Left;   AV FISTULA PLACEMENT Left 07/23/2019   Procedure: LEFT ARM ARTERIOVENOUS (AV) FISTULA CREATION;  Surgeon: Waynetta Sandy, MD;  Location: [place];  Service: Vascular;  Laterality: Left;   BALLOON DILATION N/A 12/29/2012   Procedure: Larrie Kass DILATION;  Surgeon: Inda Castle, MD;  Location: [facility];  Service: Endoscopy;  Laterality: N/A;   BALLOON DILATION N/A 06/25/2013   Procedure: BALLOON DILATION;  Surgeon: Inda Castle, MD;  Location: [facility];  Service: Endoscopy;  Laterality: N/A;   BASCILIC VEIN TRANSPOSITION Left 09/24/2019   Procedure: SECOND STAGE LEFT BASCILIC VEIN TRANSPOSITION;  Surgeon: Waynetta Sandy, MD;  Location: [place];  Service: Vascular;  Laterality: Left;   BOTOX INJECTION  12/29/2012   Procedure: BOTOX INJECTION;  Surgeon: Inda Castle, MD;  Location: [facility];  Service: Endoscopy;;  BOTOX INJECTION N/A 06/25/2013   Procedure: BOTOX INJECTION;  Surgeon: Inda Castle, MD;  Location: [facility];  Service: Endoscopy;  Laterality: N/A;   CARDIAC CATHETERIZATION  X 2   /notes 08/28/2013   CARPAL TUNNEL RELEASE Right    CHOLECYSTECTOMY     COLONOSCOPY  04/10/2012   Normal    ESOPHAGEAL MANOMETRY N/A 12/26/2015   Procedure: ESOPHAGEAL MANOMETRY (EM);  Surgeon: Doran Stabler, MD;  Location: [facility];  Service: Gastroenterology;  Laterality: N/A;   ESOPHAGOGASTRODUODENOSCOPY N/A 12/29/2012   Procedure: ESOPHAGOGASTRODUODENOSCOPY (EGD);  Surgeon: Inda Castle, MD;  Location: [facility];  Service: Endoscopy;  Laterality: N/A;   ESOPHAGOGASTRODUODENOSCOPY N/A 06/25/2013   Procedure: ESOPHAGOGASTRODUODENOSCOPY (EGD);  Surgeon: Inda Castle, MD;   Location: [facility];  Service: Endoscopy;  Laterality: N/A;   ESOPHAGOGASTRODUODENOSCOPY N/A 11/03/2013   Procedure: ESOPHAGOGASTRODUODENOSCOPY (EGD);  Surgeon: Inda Castle, MD;  Location: [facility];  Service: Endoscopy;  Laterality: N/A;  Botox - ballon dilation   ESOPHAGOGASTRODUODENOSCOPY N/A 08/14/2016   Procedure: ESOPHAGOGASTRODUODENOSCOPY (EGD);  Surgeon: Milus Banister, MD;  Location: [facility];  Service: Endoscopy;  Laterality: N/A;   FOOT FRACTURE SURGERY Right    HEEL SPUR EXCISION Right    TONSILLECTOMY     TUBAL LIGATION     Social History:  reports that she has never smoked. She has never used smokeless tobacco. She reports that she does not drink alcohol and does not use drugs.  Allergies  Allergen Reactions   Hydrochlorothiazide Itching   Amlodipine     Foot swelling/chest pain    Duloxetine     Hallucinations    Lisinopril Cough   Metformin     Unknown reaction   Codeine Itching    No hives or rash, takes benadryl   Dilaudid [Hydromorphone Hcl] Itching    "mild itching hrs after dilaudid given"    Family History  Problem Relation Age of Onset   Hypertension Mother    Alzheimer's disease Father    Throat cancer Sister 71       died at age 20   Diabetes Brother    Colon cancer Neg Hx    Rectal cancer Neg Hx    Stomach cancer Neg Hx     Prior to Admission medications   Medication Sig Start Date End Date Taking? Authorizing Provider  acetaminophen (TYLENOL) 500 MG tablet Take 1,000 mg by mouth 2 (two) times daily.    [provider]  albuterol (VENTOLIN HFA) 108 (90 Base) MCG/ACT inhaler Inhale 2 puffs into the lungs every 4 (four) hours as needed for wheezing or shortness of breath.     [provider]  ALPRAZolam Duanne Moron) 0.25 MG tablet Take 0.25 mg by mouth 3 (three) times daily as needed. 04/07/21   [provider]  BELBUCA 75 MCG FILM SMARTSIG:1 Strip(s) By Mouth Every 12 Hours 05/03/21   [provider]   cloNIDine (CATAPRES) 0.1 MG tablet Take 0.1-0.2 mg by mouth 2 (two) times daily. 06/15/21   [provider]  cyclobenzaprine (FLEXERIL) 10 MG tablet Take 10 mg by mouth 2 (two) times daily as needed. 03/21/21   [provider]  furosemide (LASIX) 80 MG tablet Take 80 mg by mouth 2 (two) times daily.  07/17/19   [provider]  gabapentin (NEURONTIN) 300 MG capsule Take 2 AM, 1 PM, 2 QHS 10/12/21   Hyatt, Max T, DPM  glimepiride (AMARYL) 4 MG tablet Take 4 mg by mouth in the morning  and at bedtime.     [provider]  HUMULIN N 100 UNIT/ML injection Inject 15 Units into the skin 2 (two) times daily. 06/26/18   [provider]  hydrALAZINE (APRESOLINE) 25 MG tablet SMARTSIG:1 Tablet(s) By Mouth Every 12 Hours 05/11/21   [provider]  ketorolac (ACULAR) 0.5 % ophthalmic solution 1 drop 4 (four) times daily. 05/09/21   [provider]  labetalol (NORMODYNE) 300 MG tablet Take 300 mg by mouth 2 (two) times daily. 05/04/21   [provider]    Physical Exam: Vitals:   10/17/21 1045 10/17/21 1100 10/17/21 1121 10/17/21 1130  BP: (!) 179/91 (!) 187/80  (!) 183/86  Pulse: 62 61 66 64  Resp: 15 15  13   Temp:      TempSrc:      SpO2: 100% 96%  97%   General:  Appears calm and comfortable and is in NAD Eyes:  EOMI, normal lids, iris ENT:  grossly normal hearing, lips & tongue, mmm Neck:  no LAD, masses or thyromegaly Cardiovascular:  RRR, no m/r/g. No LE edema.  Respiratory:   CTA bilaterally with no mild R > L basilar crackles.  Normal respiratory effort.  On room air. Abdomen:  soft, NT, ND Skin:  no rash or induration seen on limited exam Musculoskeletal:  grossly normal tone BUE/BLE, good ROM, no bony abnormality Psychiatric:  grossly normal mood and affect, speech fluent and appropriate, AOx3 Neurologic:  CN 2-12 grossly intact, moves all extremities in coordinated fashion   Radiological Exams on Admission: Independently  reviewed - see discussion in A/P where applicable  DG Chest 2 View  Result Date: 10/17/2021 CLINICAL DATA:  Chest pain. EXAM: CHEST - 2 VIEW COMPARISON:  Chest radiograph dated 03/21/2020. FINDINGS: There is mild cardiomegaly with mild vascular congestion and interstitial edema. No focal consolidation, pleural effusion or pneumothorax. No acute osseous pathology. IMPRESSION: Mild cardiomegaly with mild vascular congestion and interstitial edema. Electronically Signed   By: Anner Crete M.D.   On: 10/17/2021 02:41    EKG: Independently reviewed.  NSR with rate 68; nonspecific ST changes with no evidence of acute ischemia   Labs on Admission: I have personally reviewed the available labs and imaging studies at the time of the admission.  Pertinent labs:    CO2 41 Glucose 215 BUN 25/Creatinine 4.66/GFR 10 BNP 366.5 HS troponin 27, 22 WBC 10.5 Hgb 10.0    Assessment and Plan: Principal Problem:   Hypertensive crisis Active Problems:   End stage renal disease (HCC)   Chronic pain   Type 2 diabetes mellitus (HCC)    Hypertensive crisis -Patient presenting with chest pain and SOB concerning for hypertensive crisis -She has ESRD and appears to be mildly volume overloaded; she may benefit from an additional HD session even though she has not missed any sessions -Will give an extra dose of 80 mg IV Lasix and resume PO Lasix this evening -The patient received her home PO meds in the ER and was started on a NTG drip -The NTG drip is causing her to have a headache and does not appear to be needed so will stop -Will observe in progressive care overnight -Mildly elevated troponin but negative delta, consistent with demand ischemia -Continue home meds - clonidine, hydralazine (increase from BID to TID), and labetalol -Will add prn hydralazine   Volume overload in an ESRD on HD patient -She has not missed any sessions by her report and last went yesterday; she appears to need  any extra  session and/or a new EDW -Nephrology consult requested -Patient on chronic MWF HD -Nephrology prn order set utilized  Chronic pain -I have reviewed this patient in the [place] Controlled Substances Reporting System.  She was receiving medications from only one provider but has not received prescriptions since March (previously on Xanax and Belbuca) -She is not at particularly high risk of opioid misuse, diversion, or overdose.  -Continue cyclobenzaprine, gabapentin  DM -Will check A1c -hold Amaryl -Continue NPH -Cover with very sensitive-scale SSI      Advance Care Planning:   Code Status: Full Code   Consults: Nephrology  DVT Prophylaxis: Heparin  Family Communication: None present; she is capable of communicating with family at this time  Severity of Illness: The appropriate patient status for this patient is OBSERVATION. Observation status is judged to be reasonable and necessary in order to provide the required intensity of service to ensure the patient's safety. The patient's presenting symptoms, physical exam findings, and initial radiographic and laboratory data in the context of their medical condition is felt to place them at decreased risk for further clinical deterioration. Furthermore, it is anticipated that the patient will be medically stable for discharge from the hospital within 2 midnights of admission.   Author: Karmen Bongo, MD 10/17/2021 11:46 AM  For on call review [email or web address].

## 2021-10-17 NOTE — ED Notes (Signed)
Dr. Yates at bedside.  

## 2021-10-17 NOTE — ED Notes (Signed)
Pt transported to dialysis

## 2021-10-17 NOTE — Procedures (Signed)
   I was present at this dialysis session, have reviewed the session itself and made  appropriate changes Kelly Splinter MD [place] pager [phone]   10/17/2021, 3:45 PM

## 2021-10-18 ENCOUNTER — Inpatient Hospital Stay (HOSPITAL_COMMUNITY): Payer: Medicare Other

## 2021-10-18 DIAGNOSIS — I169 Hypertensive crisis, unspecified: Secondary | ICD-10-CM | POA: Diagnosis present

## 2021-10-18 DIAGNOSIS — Z833 Family history of diabetes mellitus: Secondary | ICD-10-CM | POA: Diagnosis not present

## 2021-10-18 DIAGNOSIS — Z8249 Family history of ischemic heart disease and other diseases of the circulatory system: Secondary | ICD-10-CM | POA: Diagnosis not present

## 2021-10-18 DIAGNOSIS — Z992 Dependence on renal dialysis: Secondary | ICD-10-CM | POA: Diagnosis not present

## 2021-10-18 DIAGNOSIS — F419 Anxiety disorder, unspecified: Secondary | ICD-10-CM | POA: Diagnosis present

## 2021-10-18 DIAGNOSIS — E1165 Type 2 diabetes mellitus with hyperglycemia: Secondary | ICD-10-CM | POA: Diagnosis present

## 2021-10-18 DIAGNOSIS — R079 Chest pain, unspecified: Secondary | ICD-10-CM | POA: Diagnosis not present

## 2021-10-18 DIAGNOSIS — Z20822 Contact with and (suspected) exposure to covid-19: Secondary | ICD-10-CM | POA: Diagnosis present

## 2021-10-18 DIAGNOSIS — I248 Other forms of acute ischemic heart disease: Secondary | ICD-10-CM | POA: Diagnosis present

## 2021-10-18 DIAGNOSIS — G8929 Other chronic pain: Secondary | ICD-10-CM | POA: Diagnosis present

## 2021-10-18 DIAGNOSIS — B182 Chronic viral hepatitis C: Secondary | ICD-10-CM | POA: Diagnosis present

## 2021-10-18 DIAGNOSIS — Z820 Family history of epilepsy and other diseases of the nervous system: Secondary | ICD-10-CM | POA: Diagnosis not present

## 2021-10-18 DIAGNOSIS — K219 Gastro-esophageal reflux disease without esophagitis: Secondary | ICD-10-CM | POA: Diagnosis present

## 2021-10-18 DIAGNOSIS — N186 End stage renal disease: Secondary | ICD-10-CM | POA: Diagnosis present

## 2021-10-18 DIAGNOSIS — K224 Dyskinesia of esophagus: Secondary | ICD-10-CM | POA: Diagnosis present

## 2021-10-18 DIAGNOSIS — J45909 Unspecified asthma, uncomplicated: Secondary | ICD-10-CM | POA: Diagnosis present

## 2021-10-18 DIAGNOSIS — R112 Nausea with vomiting, unspecified: Secondary | ICD-10-CM | POA: Diagnosis not present

## 2021-10-18 DIAGNOSIS — E1122 Type 2 diabetes mellitus with diabetic chronic kidney disease: Secondary | ICD-10-CM | POA: Diagnosis present

## 2021-10-18 DIAGNOSIS — E669 Obesity, unspecified: Secondary | ICD-10-CM | POA: Diagnosis present

## 2021-10-18 DIAGNOSIS — K567 Ileus, unspecified: Secondary | ICD-10-CM | POA: Diagnosis present

## 2021-10-18 DIAGNOSIS — K2970 Gastritis, unspecified, without bleeding: Secondary | ICD-10-CM | POA: Diagnosis present

## 2021-10-18 DIAGNOSIS — I120 Hypertensive chronic kidney disease with stage 5 chronic kidney disease or end stage renal disease: Secondary | ICD-10-CM | POA: Diagnosis present

## 2021-10-18 DIAGNOSIS — D631 Anemia in chronic kidney disease: Secondary | ICD-10-CM | POA: Diagnosis present

## 2021-10-18 DIAGNOSIS — I160 Hypertensive urgency: Secondary | ICD-10-CM | POA: Diagnosis present

## 2021-10-18 DIAGNOSIS — M545 Low back pain, unspecified: Secondary | ICD-10-CM | POA: Diagnosis present

## 2021-10-18 DIAGNOSIS — Z808 Family history of malignant neoplasm of other organs or systems: Secondary | ICD-10-CM | POA: Diagnosis not present

## 2021-10-18 DIAGNOSIS — F32A Depression, unspecified: Secondary | ICD-10-CM | POA: Diagnosis present

## 2021-10-18 LAB — HEPATITIS B SURFACE ANTIBODY, QUANTITATIVE: Hep B S AB Quant (Post): 5.9 m[IU]/mL — ABNORMAL LOW (ref >9.9)

## 2021-10-18 LAB — TROPONIN I (HIGH SENSITIVITY): Troponin I (High Sensitivity): 15 ng/L (ref <18)

## 2021-10-18 LAB — BASIC METABOLIC PANEL
Anion gap: 15 (ref 5–15)
BUN: 26 mg/dL — ABNORMAL HIGH (ref 6–20)
CO2: 31 mmol/L (ref 22–32)
Calcium: 9.7 mg/dL (ref 8.9–10.3)
Chloride: 91 mmol/L — ABNORMAL LOW (ref 98–111)
Creatinine, Ser: 4.6 mg/dL — ABNORMAL HIGH (ref 0.44–1.00)
GFR, Estimated: 10 mL/min — ABNORMAL LOW (ref >60)
Glucose, Bld: 170 mg/dL — ABNORMAL HIGH (ref 70–99)
Potassium: 3.9 mmol/L (ref 3.5–5.1)
Sodium: 137 mmol/L (ref 135–145)

## 2021-10-18 LAB — D-DIMER, QUANTITATIVE: D-Dimer, Quant: 3.07 ug/mL-FEU — ABNORMAL HIGH (ref 0.00–0.50)

## 2021-10-18 LAB — CBC
HCT: 29.4 % — ABNORMAL LOW (ref 36.0–46.0)
Hemoglobin: 10.5 g/dL — ABNORMAL LOW (ref 12.0–15.0)
MCH: 29.8 pg (ref 26.0–34.0)
MCHC: 35.7 g/dL (ref 30.0–36.0)
MCV: 83.5 fL (ref 80.0–100.0)
Platelets: 191 10*3/uL (ref 150–400)
RBC: 3.52 MIL/uL — ABNORMAL LOW (ref 3.87–5.11)
RDW: 17.4 % — ABNORMAL HIGH (ref 11.5–15.5)
WBC: 12.5 10*3/uL — ABNORMAL HIGH (ref 4.0–10.5)
nRBC: 0 % (ref 0.0–0.2)

## 2021-10-18 LAB — GLUCOSE, CAPILLARY
Glucose-Capillary: 180 mg/dL — ABNORMAL HIGH (ref 70–99)
Glucose-Capillary: 154 mg/dL — ABNORMAL HIGH (ref 70–99)
Glucose-Capillary: 342 mg/dL — ABNORMAL HIGH (ref 70–99)

## 2021-10-18 MED ORDER — ISOSORB DINITRATE-HYDRALAZINE 20-37.5 MG PO TABS
1.0000 | ORAL_TABLET | Freq: Three times a day (TID) | ORAL | Status: DC
  Administered 2021-10-18 – 2021-10-21 (×8): 1 via ORAL
  Filled 2021-10-18 (×8): qty 1

## 2021-10-18 MED ORDER — IOHEXOL 350 MG/ML SOLN
70.0000 mL | Freq: Once | INTRAVENOUS | Status: AC | PRN
Start: 2021-10-18 — End: 2021-10-18
  Administered 2021-10-18: 70 mL via INTRAVENOUS

## 2021-10-18 MED ORDER — TRAMADOL HCL 50 MG PO TABS
50.0000 mg | ORAL_TABLET | Freq: Two times a day (BID) | ORAL | Status: DC | PRN
  Administered 2021-10-18 – 2021-10-21 (×4): 50 mg via ORAL
  Filled 2021-10-18 (×4): qty 1

## 2021-10-18 NOTE — Consult Note (Addendum)
Cardiology Consultation   Patient ID: [name] MRN: [id number]; DOB: [birth year]  Admit date: 10/17/2021 Date of Consult: 10/18/2021  PCP:  [name], MD   [place] [facility] Cardiologist:  Jenean Lindau, MD        Patient Profile:   [name] [name] is a 61 y.o. female with a hx of hypertension, morbid obesity, DM 2, hyperlipidemia, esophageal spasm, chronic back pain, GI bleed 2018,  frequent UTI, and ESRD on HD who is being seen 10/18/2021 for the evaluation of chest pain at the request of Dr. Tyrell Antonio.  History of Present Illness:   [name] [name] was referred to Dr. Geraldo Pitter in 2020 after an episode of syncope.  She also reported chest pain and underwent nuclear stress test 07/2018 that showed normal perfusion.  Echocardiogram at that time revealed LVEF 46-80%, diastolic dysfunction, normal RV, and no significant valvular disease.  She has not seen cardiology since 2020. She has progressed to ESRD and is now on HD with a Monday Wednesday Friday schedule.  Maintained on clonidine, 80 mg lasix BID, hydrazine 25 mg BID.    Patient presented to [facility] with chest pain.  She reports chest pain when lying flat relieved with sitting up.  Chest pain began around 7 PM yesterday.  When she tried to walk around she became dyspneic and notes this dyspnea worsened with supine positioning. She tried aspirin, Tylenol, and inhaler without improvement in her symptoms.  In the ED, patient found to be very hypertensive with systolic pressures in the 200s.  Patient was initiated on a nitroglycerin drip and admitted for hypertensive urgency.  Today patient reports noticing a decrease in exertional tolerance over the past week.  She feels like she has chest pain that primarily occurs when supine and with elevated blood pressure, but also reports that it does occasionally occur with exertion.  Pain is rated an 8/10 at its worst and is described as a pressure and squeeze.  Patient has noticed  significantly elevated blood pressure at home in the past week and she reports a reading as high as 288/124 despite taking extra clonidine. She endorses a headache intermittently over the past week that correlates with elevated blood pressures.  BP 224/86, HR 60s HS troponin 27 --> 22 EKG with lateral TWI  Cardiology was consulted.   Past Medical History:  Diagnosis Date   Anemia    Anxiety    Arthritis    "hips" (08/28/2013)   Asthma    Chronic kidney disease    Chronic lower back pain    10-27-13 not a problem at this time   Depression    Esophageal spasm    Frequent UTI    GERD (gastroesophageal reflux disease)    Hepatitis C    genotype 1B   High cholesterol    Hypertension    Migraine    "monthly" (08/28/2013)   Pneumonia    "several times"   Type II diabetes mellitus ([place])    Ulcer    hx gastric ulcers    Past Surgical History:  Procedure Laterality Date   ABDOMINAL HYSTERECTOMY     APPENDECTOMY     AV FISTULA PLACEMENT Left 05/19/2019   Procedure: LEFT ARM Brachiocephalic ARTERIOVENOUS (AV) FISTULA CREATION;  Surgeon: Waynetta Sandy, MD;  Location: [facility];  Service: Vascular;  Laterality: Left;   AV FISTULA PLACEMENT Left 07/23/2019   Procedure: LEFT ARM ARTERIOVENOUS (AV) FISTULA CREATION;  Surgeon: Waynetta Sandy, MD;  Location: [facility]  OR;  Service: Vascular;  Laterality: Left;   BALLOON DILATION N/A 12/29/2012   Procedure: BALLOON DILATION;  Surgeon: Inda Castle, MD;  Location: [facility];  Service: Endoscopy;  Laterality: N/A;   BALLOON DILATION N/A 06/25/2013   Procedure: BALLOON DILATION;  Surgeon: Inda Castle, MD;  Location: [facility];  Service: Endoscopy;  Laterality: N/A;   BASCILIC VEIN TRANSPOSITION Left 09/24/2019   Procedure: SECOND STAGE LEFT BASCILIC VEIN TRANSPOSITION;  Surgeon: Waynetta Sandy, MD;  Location: [facility];  Service: Vascular;  Laterality: Left;   BOTOX INJECTION  12/29/2012   Procedure: BOTOX  INJECTION;  Surgeon: Inda Castle, MD;  Location: [facility];  Service: Endoscopy;;   BOTOX INJECTION N/A 06/25/2013   Procedure: BOTOX INJECTION;  Surgeon: Inda Castle, MD;  Location: [facility];  Service: Endoscopy;  Laterality: N/A;   CARDIAC CATHETERIZATION  X 2   /notes 08/28/2013   CARPAL TUNNEL RELEASE Right    CHOLECYSTECTOMY     COLONOSCOPY  04/10/2012   Normal    ESOPHAGEAL MANOMETRY N/A 12/26/2015   Procedure: ESOPHAGEAL MANOMETRY (EM);  Surgeon: Doran Stabler, MD;  Location: [facility];  Service: Gastroenterology;  Laterality: N/A;   ESOPHAGOGASTRODUODENOSCOPY N/A 12/29/2012   Procedure: ESOPHAGOGASTRODUODENOSCOPY (EGD);  Surgeon: Inda Castle, MD;  Location: [facility];  Service: Endoscopy;  Laterality: N/A;   ESOPHAGOGASTRODUODENOSCOPY N/A 06/25/2013   Procedure: ESOPHAGOGASTRODUODENOSCOPY (EGD);  Surgeon: Inda Castle, MD;  Location: [facility];  Service: Endoscopy;  Laterality: N/A;   ESOPHAGOGASTRODUODENOSCOPY N/A 11/03/2013   Procedure: ESOPHAGOGASTRODUODENOSCOPY (EGD);  Surgeon: Inda Castle, MD;  Location: [facility];  Service: Endoscopy;  Laterality: N/A;  Botox - ballon dilation   ESOPHAGOGASTRODUODENOSCOPY N/A 08/14/2016   Procedure: ESOPHAGOGASTRODUODENOSCOPY (EGD);  Surgeon: Milus Banister, MD;  Location: [facility] ENDOSCOPY;  Service: Endoscopy;  Laterality: N/A;   FOOT FRACTURE SURGERY Right    HEEL SPUR EXCISION Right    TONSILLECTOMY     TUBAL LIGATION       Home Medications:  Prior to Admission medications   Medication Sig Start Date End Date Taking? Authorizing Provider  acetaminophen (TYLENOL) 500 MG tablet Take 1,000 mg by mouth 2 (two) times daily.   Yes [provider]  albuterol (VENTOLIN HFA) 108 (90 Base) MCG/ACT inhaler Inhale 2 puffs into the lungs every 4 (four) hours as needed for wheezing or shortness of breath.    Yes [provider]  ALPRAZolam (XANAX) 0.25 MG tablet Take 0.25 mg by mouth 3 (three) times  daily as needed for anxiety. 04/07/21  Yes [provider]  amitriptyline (ELAVIL) 10 MG tablet Take 10 mg by mouth at bedtime. 10/05/21  Yes [provider]  Cinnamon 500 MG TABS Take 500 mg by mouth daily.   Yes [provider]  cloNIDine (CATAPRES) 0.1 MG tablet Take 0.1-0.2 mg by mouth 2 (two) times daily. 06/15/21  Yes [provider]  cyclobenzaprine (FLEXERIL) 10 MG tablet Take 10 mg by mouth 2 (two) times daily as needed for muscle spasms. 03/21/21  Yes [provider]  furosemide (LASIX) 80 MG tablet Take 80 mg by mouth 2 (two) times daily.  07/17/19  Yes [provider]  gabapentin (NEURONTIN) 300 MG capsule Take 2 AM, 1 PM, 2 QHS Patient taking differently: Take 300-600 mg by mouth See admin instructions. Taking 2 caps ( 600mg ) in the AM and 300 mg @ 1:00 pm and 2 caps ( 600 mg) at night 10/12/21  Yes Hyatt, Max T,  DPM  HUMULIN N 100 UNIT/ML injection Inject 15 Units into the skin 2 (two) times daily. 06/26/18  Yes [provider]  hydrALAZINE (APRESOLINE) 25 MG tablet Take 25 mg by mouth 2 (two) times daily. 05/11/21  Yes [provider]  ketorolac (ACULAR) 0.5 % ophthalmic solution Place 1 drop into the left eye in the morning, at noon, and at bedtime. 05/09/21  Yes [provider]  labetalol (NORMODYNE) 300 MG tablet Take 300 mg by mouth 2 (two) times daily. 05/04/21  Yes [provider]  lidocaine (LIDODERM) 5 % Place 1 patch onto the skin daily. 10/11/21  Yes [provider]  prednisoLONE acetate (PRED FORTE) 1 % ophthalmic suspension Place 1 drop into the left eye 3 (three) times daily. 09/14/21  Yes [provider]  BELBUCA 75 MCG FILM SMARTSIG:1 Strip(s) By Mouth Every 12 Hours Patient not taking: Reported on 10/17/2021 05/03/21   [provider]    Inpatient Medications: Scheduled Meds:  amitriptyline  10 mg Oral QHS   calcitRIOL  1 mcg Oral Q M,W,F-HD   Chlorhexidine Gluconate  Cloth  6 each Topical Q0600   Chlorhexidine Gluconate Cloth  6 each Topical Q0600   cloNIDine  0.2 mg Oral BID   furosemide  80 mg Oral BID   gabapentin  300 mg Oral Q1200   gabapentin  600 mg Oral BID   heparin  5,000 Units Subcutaneous Q8H   hydrALAZINE  25 mg Oral Q8H   insulin aspart  0-6 Units Subcutaneous TID WC   insulin NPH Human  15 Units Subcutaneous BID AC & HS   ketorolac  1 drop Left Eye QID   labetalol  300 mg Oral BID   prednisoLONE acetate  1 drop Left Eye TID   sodium chloride flush  3 mL Intravenous Q12H   Continuous Infusions:  PRN Meds: acetaminophen **OR** acetaminophen, albuterol, calcium carbonate (dosed in mg elemental calcium), camphor-menthol **AND** hydrOXYzine, cyclobenzaprine, docusate sodium, feeding supplement (NEPRO CARB STEADY), hydrALAZINE, ondansetron **OR** ondansetron (ZOFRAN) IV, sorbitol, zolpidem  Allergies:    Allergies  Allergen Reactions   Hydrochlorothiazide Itching   Amlodipine     Foot swelling/chest pain    Duloxetine     Hallucinations    Lisinopril Cough   Metformin     Unknown reaction   Codeine Itching    No hives or rash, takes benadryl   Dilaudid [Hydromorphone Hcl] Itching    "mild itching hrs after dilaudid given"    Social History:   Social History   Socioeconomic History   Marital status: Married    Spouse name: Lanny Hurst    Number of children: 2   Years of education: Not on file   Highest education level: Not on file  Occupational History   Occupation: disabled  Tobacco Use   Smoking status: Never   Smokeless tobacco: Never  Vaping Use   Vaping Use: Never used  Substance and Sexual Activity   Alcohol use: No   Drug use: No   Sexual activity: Yes    Partners: Male  Other Topics Concern   Not on file  Social History Narrative   Not on file   Social Determinants of Health   Financial Resource Strain: Not on file  Food Insecurity: Not on file  Transportation Needs: Not on file  Physical Activity:  Not on file  Stress: Not on file  Social Connections: Not on file  Intimate Partner Violence: Not on file    Family History:  Family History  Problem Relation Age of Onset   Hypertension Mother    Alzheimer's disease Father    Throat cancer Sister 21       died at age 24   Diabetes Brother    Colon cancer Neg Hx    Rectal cancer Neg Hx    Stomach cancer Neg Hx      ROS:  Please see the history of present illness.   All other ROS reviewed and negative.     Physical Exam/Data:   Vitals:   10/18/21 1133 10/18/21 1144 10/18/21 1200 10/18/21 1230  BP:  (!) 163/77 (!) 172/82 (!) 190/89  Pulse:  67 64 65  Resp:  (!) 22 (!) 21 17  Temp:      TempSrc:      SpO2:  98% 96% 97%  Weight: 95.3 kg     Height:        Intake/Output Summary (Last 24 hours) at 10/18/2021 1259 Last data filed at 10/18/2021 0300 Gross per 24 hour  Intake 242.83 ml  Output 1900 ml  Net -1657.17 ml      10/18/2021   11:33 AM 10/18/2021    4:30 AM 10/17/2021    6:52 PM  Last 3 Weights  Weight (lbs) 210 lb 1.6 oz 210 lb 3.2 oz 210 lb 8.6 oz  Weight (kg) 95.3 kg 95.346 kg 95.5 kg     Body mass index is 34.96 kg/m.  General:  Well nourished, well developed, in no acute distress HEENT: normal Neck: no JVD Vascular: No carotid bruits; Distal pulses 2+ bilaterally Cardiac:  normal S1, S2; RRR; no murmur  Lungs:  clear to auscultation bilaterally, no wheezing, rhonchi or rales  Abd: soft, nontender, no hepatomegaly  Ext: no edema. Left AV fistula. Musculoskeletal:  No deformities, BUE and BLE strength normal and equal Skin: warm and dry  Neuro:  CNs 2-12 intact, no focal abnormalities noted Psych:  Normal affect   EKG:  The EKG was personally reviewed and demonstrates:  sinus rhythm with HR  and TWI lateral leads Telemetry:  Patient has not been on continuous telemetry.  Relevant CV Studies:  Echo 08/07/18: 1. The left ventricle has normal systolic function with an ejection  fraction of 60-65%.  The cavity size was normal. There is mildly increased  left ventricular wall thickness. Left ventricular diastolic Doppler  parameters are consistent with impaired  relaxation.   2. The right ventricle has normal systolic function. The cavity was  normal. There is no increase in right ventricular wall thickness.   Nuclear stress test 07/2018: Nuclear stress EF: 59%. The left ventricular ejection fraction is normal (55-65%). Blood pressure demonstrated a normal response to exercise. There was no ST segment deviation noted during stress. This is a low risk study. No ischemia, no MI, normal EF.  Laboratory Data:  High Sensitivity Troponin:   Recent Labs  Lab 10/17/21 0207 10/17/21 0423 10/18/21 0921  TROPONINIHS 27* 22* 15     Chemistry Recent Labs  Lab 10/17/21 0207 10/18/21 0921  NA 142 137  K 3.5 3.9  CL 88* 91*  CO2 41* 31  GLUCOSE 215* 170*  BUN 25* 26*  CREATININE 4.66* 4.60*  CALCIUM 9.1 9.7  GFRNONAA 10* 10*  ANIONGAP 13 15    No results for input(s): "PROT", "ALBUMIN", "AST", "ALT", "ALKPHOS", "BILITOT" in the last 168 hours. Lipids No results for input(s): "CHOL", "TRIG", "HDL", "LABVLDL", "LDLCALC", "CHOLHDL" in the last 168 hours.  Hematology Recent Labs  Lab 10/17/21 0207 10/18/21 0921  WBC 10.5 12.5*  RBC 3.35* 3.52*  HGB 10.0* 10.5*  HCT 29.1* 29.4*  MCV 86.9 83.5  MCH 29.9 29.8  MCHC 34.4 35.7  RDW 17.6* 17.4*  PLT 214 191   Thyroid No results for input(s): "TSH", "FREET4" in the last 168 hours.  BNP Recent Labs  Lab 10/17/21 0207  BNP 366.5*    DDimer No results for input(s): "DDIMER" in the last 168 hours.   Radiology/Studies:  DG Chest 2 View  Result Date: 10/17/2021 CLINICAL DATA:  Chest pain. EXAM: CHEST - 2 VIEW COMPARISON:  Chest radiograph dated 03/21/2020. FINDINGS: There is mild cardiomegaly with mild vascular congestion and interstitial edema. No focal consolidation, pleural effusion or pneumothorax. No acute osseous  pathology. IMPRESSION: Mild cardiomegaly with mild vascular congestion and interstitial edema. Electronically Signed   By: Anner Crete M.D.   On: 10/17/2021 02:41    Patient Profile:  [name] [name] is a 61 y.o. female with a hx of hypertension, morbid obesity, DM 2, hyperlipidemia, esophageal spasm, chronic back pain, GI bleed 2018,  frequent UTI, and ESRD on HD who is being seen 10/18/2021 for the evaluation of chest pain at the request of Dr. Tyrell Antonio.  Assessment and Plan:   -Chest pain -Hypertension with acute crisis  Patient with poorly controlled blood pressures over the last week, as high as 288/124.  She endorses chest pain that is worse with elevated blood pressures and when lying down.  Pain improves when she sits up but also has occurred with exertion. Pain does not appear to be of a pericardial irritation etiology.  Pain also noted to improve with additional doses of Clonidine.  She denies recent fevers, chills.   Patient with troponins of 27, 22, 15 this admission.  Chest pain very likely secondary to demand ischemia with profound hypertension.  Patient with systolic murmur noted on physical exam today. She previously underwent a stress test in 2020 that was negative and also had a complete echocardiogram on 08/07/2018 which revealed normal systolic function with an ejection fraction of 60 to 65%.  This echocardiogram did not report any valvular abnormalities.    Given murmur (likely AS) and patient appearing volume up at time of admission, will order echocardiogram to assess ejection fraction and valvular function.  If new echo this admission shows a reduced ejection fraction, would need to consider ischemic evaluation given multiple risk factors. If echo is benign, patient can be followed up with in outpatient setting. Recommend up-titration of antihypertensive regimen as tolerated: Start Bidil 20-37.5 TID in place of hydralazine alone. Will request price check for affordability. If  cost is prohibitive, recommend ordering individually. Continue Clonidine 0.2mg  BID Continue Labetalol 300mg  BID   -ESRD on M/W/F dialysis schedule  Patient reportedly volume up appearing at time of admission now appears euvolemic after extra session of dialysis yesterday 9/5.  Patient reportedly with increased fluid intake recently at home as a result of decreased food intake in an attempt to lose weight.  Continue scheduled dialysis per nephrology.  -Chronic pain Continue cyclobenzaprine and gabapentin per primary team  -Diabetes Mellitus type II A1c 8.2 this admission.  Continue NPH and sliding scale insulin per primary team     Risk Assessment/Risk Scores:    New York Heart Association (NYHA) Functional Class NYHA Class II    For questions or updates, please contact [facility] Please consult [email or web address] for contact info under    Signed, Lily Kocher, PA-C  10/18/2021 12:59 PM  Patient examined in dialysis. Care discussed with PA and patient Exam with obese black female No chest pain. SEM soft no rub Lungs clear Fistula LUE not new Abdomen benign trace edema. Troponin negative ECG no acute changes Suspect SSCP related to needing more dialysis and HTN.  Will update echo if EF normal and no large effusion ok to d/c home with outpatient consideration for myovue with Dr Julianne Rice. Change hydralazine to bidil for better BP control and consider adding norvasc if needed. BP should be managed by renal/primary   Jenkins Rouge MD [facility]

## 2021-10-18 NOTE — Progress Notes (Signed)
Patient began to report chest pain. Provider Stovall called to bay to assess. Patient states chest hurts midsternal. No SOB. The same situation occurred yesterday.  Per provider add 2L O2, give 200 ML bolus. Turn off UF. Will continue to monitor.  Vitals BP 176/101 RR 16 HR 74

## 2021-10-18 NOTE — Progress Notes (Signed)
Received patient in bed to unit.  Alert and oriented.  Informed consent signed and in chart.   Treatment initiated: 0131 Treatment completed: 4388  Patient began having chest pain. Provider called to the bay assessed patient, ordered 200 ml bolus, EKG and stopped UF   Transported back to the room  Alert, without acute distress.  Hand-off given to patient's nurse.   Access used: L AVF Access issues: n/a  Total UF removed: 1230 Medication(s) given: 5MG  hydralazine  Post HD VS: 98.0, RR 18,P64,BP:203/85 Post HD weight: 94.4Kg   [facility]

## 2021-10-18 NOTE — Progress Notes (Signed)
[facility] Progress Note   Subjective:  Seen in room. Had severe cramping and CP at end of HD yesterday, then another episode CP this AM - better now. Denies dyspnea this AM. BP remains high.  Objective Vitals:   10/17/21 1852 10/17/21 2104 10/18/21 0007 10/18/21 0430  BP: (!) 164/85 (!) 164/78 (!) 195/71 (!) 182/87  Pulse: 71 71 64 62  Resp: 20 16 18 15   Temp: 98.5 F (36.9 C) 98.6 F (37 C) 98.6 F (37 C) 97.8 F (36.6 C)  TempSrc: Oral Oral Oral Oral  SpO2: 95% 95% 93% 93%  Weight: 95.5 kg   95.3 kg  Height: 5' 5"  (1.651 m)      Physical Exam General: Well appearing woman, NAD. Room air Heart: RRR; no murmur Lungs: CTAB; no rales or wheezing Abdomen: soft Extremities: no LE edema Dialysis Access:  LUE AVF + bruit  Additional Objective Labs: Basic Metabolic Panel: Recent Labs  Lab 10/17/21 0207  NA 142  K 3.5  CL 88*  CO2 41*  GLUCOSE 215*  BUN 25*  CREATININE 4.66*  CALCIUM 9.1   CBC: Recent Labs  Lab 10/17/21 0207  WBC 10.5  HGB 10.0*  HCT 29.1*  MCV 86.9  PLT 214   Studies/Results: DG Chest 2 View  Result Date: 10/17/2021 CLINICAL DATA:  Chest pain. EXAM: CHEST - 2 VIEW COMPARISON:  Chest radiograph dated 03/21/2020. FINDINGS: There is mild cardiomegaly with mild vascular congestion and interstitial edema. No focal consolidation, pleural effusion or pneumothorax. No acute osseous pathology. IMPRESSION: Mild cardiomegaly with mild vascular congestion and interstitial edema. Electronically Signed   By: Anner Crete M.D.   On: 10/17/2021 02:41    Medications:   amitriptyline  10 mg Oral QHS   calcitRIOL  1 mcg Oral Q M,W,F-HD   Chlorhexidine Gluconate Cloth  6 each Topical Q0600   Chlorhexidine Gluconate Cloth  6 each Topical Q0600   cloNIDine  0.2 mg Oral BID   furosemide  80 mg Oral BID   gabapentin  300 mg Oral Q1200   gabapentin  600 mg Oral BID   heparin  5,000 Units Subcutaneous Q8H   hydrALAZINE  25 mg Oral Q8H    insulin aspart  0-6 Units Subcutaneous TID WC   insulin NPH Human  15 Units Subcutaneous BID AC & HS   ketorolac  1 drop Left Eye QID   labetalol  300 mg Oral BID   prednisoLONE acetate  1 drop Left Eye TID   sodium chloride flush  3 mL Intravenous Q12H    Dialysis Orders: MWF [facility] 4h  400/500  93.2kg  3K/2.5Ca bath  P2   Hep none  LUA AVF - last HD 9/4 post HD 95.4kg - high wt gains 3-7 kg - mircera 60 q2, last 9/4, due 9/18 - calcitriol 1.0 ug tiw po  Assessment/Plan: HTN'sive urgency/pulm edema: Volume up here with HTN and pulm edema on CXR. Chronic large gains, worse over the weekend, but was previously reaching her dry weight (93.7) until it was lowered on 9/1-- hasn't met the 93.2kg yet. Dialyzed yesterday - 1.9L removed, BP dropped and had episode of CP, which resolved with ending her treatment. Will dialyze again today. Looks like hydralazine dose was increased -> can certainly go up further if needed.  ESRD: Usual MWF schedule. S/p HD yesterday for overload, back on usual schedule today - will see if can get her weight down further.  Anemia of ESRD: Hgb 10. ESA not  due yet. Secondary HPTH: Ca ok, Phos pending. Continue home meds. T2DM: Insulin per primary. Chest pain: Likely demand ischemia. Last stress test normal in 2020. However, she is high risk for CAD and would recommend outpatient cardiology eval.  Veneta Penton, PA-C 10/18/2021, 9:24 AM  [place]

## 2021-10-18 NOTE — Progress Notes (Addendum)
Called by dialysis RN to assess the patient - c/o L chest pain while on HD. No dyspnea, no diaphoresis. UF turned off, 252mL fluid bolus given - some improvement in pain. RRR on exam, no murmur. Lungs clear. VSS. Will give another 23mL -- keep UF off and give her some O2. Will also order 12-lead EKG. Trop 27 yesterday -> 22 -> 15 this AM. But, I would still recommend further cardiac ischemic work-up. Either here, or as outpatient in near future.  Veneta Penton, PA-C [facility] Pager [phone]   Just spoke to Dr. Johnsie Cancel -- EKG stable. CP down to 3 out of 10. He will review echo, but suspects it will be normal. He recommends changing hydralazine to BiDil -> looks like change has already been made. [place]

## 2021-10-18 NOTE — Progress Notes (Signed)
PROGRESS NOTE    [name] [name]  IEP:[id number] DOB: [birth year] DOA: 10/17/2021 PCP: [name], MD   Brief Narrative: 61 year old with past medical history significant for ESRD on hemodialysis Monday Wednesday and Friday, hypertension, hyperlipidemia, diabetes presents with chest pain, associated with shortness of breath.  Blood pressure was noted to be elevated 270/116.  She took extra dose of clonidine.  She had a full hemodialysis today prior to admission. Patient presented with chest pain, admitted for hypertensive crisis.    Assessment & Plan:   Principal Problem:   Hypertensive crisis Active Problems:   End stage renal disease ([place])   Chronic pain   Type 2 diabetes mellitus ([place])   1-Hypertensive crisis: She underwent hemodialysis 9/5. Plan for extra dialysis today. Hydralazine and Imdur changed to Bidil.  Continue with clonidine, labetalol.   Chest pain;  Mild elevation troponin.  Report chest pain for 1 week., worse on exertion.  Cardiology consulted  ESRD on hemodialysis Plan to proceed with hemodialysis today Had extra hemodialysis in 9/5  Chronic pain: Continue with cyclobenzaprine and gabapentin  Diabetes: Continue NPH SSI.  A1c 8.2  Estimated body mass index is 34.63 kg/m as calculated from the following:   Height as of this encounter: 5\' 5"  (1.651 m).   Weight as of this encounter: 94.4 kg.   DVT prophylaxis: Heparin  Code Status: Full code Family Communication: Care discussed with patient.  Disposition Plan:  Status is: Observation The patient remains OBS appropriate and will d/c before 2 midnights.    Consultants:  Nephrology Cardiology   Procedures:  ECHO  Antimicrobials:    Subjective: She had episode of chest pain earlier this am. Had chest pain last night.    Objective: Vitals:   10/18/21 1430 10/18/21 1443 10/18/21 1502 10/18/21 1609  BP: (!) 201/84 (!) 193/87 (!) 203/85 (!) 134/53  Pulse: 62  65   Resp: 17  18    Temp:   98 F (36.7 C)   TempSrc:      SpO2: 100%  (!) 2%   Weight:   94.4 kg   Height:        Intake/Output Summary (Last 24 hours) at 10/18/2021 1610 Last data filed at 10/18/2021 1502 Gross per 24 hour  Intake 242.83 ml  Output 3130 ml  Net -2887.17 ml   Filed Weights   10/18/21 0430 10/18/21 1133 10/18/21 1502  Weight: 95.3 kg 95.3 kg 94.4 kg    Examination:  General exam: Appears calm and comfortable  Respiratory system: Clear to auscultation. Respiratory effort normal. Cardiovascular system: S1 & S2 heard, RRR. No JVD, murmurs, rubs, gallops or clicks. No pedal edema. Gastrointestinal system: Abdomen is nondistended, soft and nontender. No organomegaly or masses felt. Normal bowel sounds heard. Central nervous system: Alert and oriented. No focal neurological deficits. Extremities: Symmetric 5 x 5 power. Skin: No rashes, lesions or ulcers Psychiatry: Judgement and insight appear normal. Mood & affect appropriate.     Data Reviewed: I have personally reviewed following labs and imaging studies  CBC: Recent Labs  Lab 10/17/21 0207 10/18/21 0921  WBC 10.5 12.5*  HGB 10.0* 10.5*  HCT 29.1* 29.4*  MCV 86.9 83.5  PLT 214 660   Basic Metabolic Panel: Recent Labs  Lab 10/17/21 0207 10/18/21 0921  NA 142 137  K 3.5 3.9  CL 88* 91*  CO2 41* 31  GLUCOSE 215* 170*  BUN 25* 26*  CREATININE 4.66* 4.60*  CALCIUM 9.1 9.7   GFR: Estimated Creatinine Clearance:  14.8 mL/min (A) (by C-G formula based on SCr of 4.6 mg/dL (H)). Liver Function Tests: No results for input(s): "AST", "ALT", "ALKPHOS", "BILITOT", "PROT", "ALBUMIN" in the last 168 hours. No results for input(s): "LIPASE", "AMYLASE" in the last 168 hours. No results for input(s): "AMMONIA" in the last 168 hours. Coagulation Profile: Recent Labs  Lab 10/17/21 0207  INR 1.0   Cardiac Enzymes: No results for input(s): "CKTOTAL", "CKMB", "CKMBINDEX", "TROPONINI" in the last 168 hours. BNP (last 3  results) No results for input(s): "PROBNP" in the last 8760 hours. HbA1C: Recent Labs    10/17/21 1200  HGBA1C 8.2*   CBG: Recent Labs  Lab 10/17/21 1200 10/17/21 1906 10/17/21 2100 10/18/21 0612  GLUCAP 186* 204* 278* 180*   Lipid Profile: No results for input(s): "CHOL", "HDL", "LDLCALC", "TRIG", "CHOLHDL", "LDLDIRECT" in the last 72 hours. Thyroid Function Tests: No results for input(s): "TSH", "T4TOTAL", "FREET4", "T3FREE", "THYROIDAB" in the last 72 hours. Anemia Panel: No results for input(s): "VITAMINB12", "FOLATE", "FERRITIN", "TIBC", "IRON", "RETICCTPCT" in the last 72 hours. Sepsis Labs: No results for input(s): "PROCALCITON", "LATICACIDVEN" in the last 168 hours.  Recent Results (from the past 240 hour(s))  SARS Coronavirus 2 by RT PCR (hospital order, performed in [facility] hospital lab) *cepheid single result test* Anterior Nasal Swab     Status: None   Collection Time: 10/17/21  7:50 AM   Specimen: Anterior Nasal Swab  Result Value Ref Range Status   SARS Coronavirus 2 by RT PCR NEGATIVE NEGATIVE Final    Comment: (NOTE) SARS-CoV-2 target nucleic acids are NOT DETECTED.  The SARS-CoV-2 RNA is generally detectable in upper and lower respiratory specimens during the acute phase of infection. The lowest concentration of SARS-CoV-2 viral copies this assay can detect is 250 copies / mL. A negative result does not preclude SARS-CoV-2 infection and should not be used as the sole basis for treatment or other patient management decisions.  A negative result may occur with improper specimen collection / handling, submission of specimen other than nasopharyngeal swab, presence of viral mutation(s) within the areas targeted by this assay, and inadequate number of viral copies (<250 copies / mL). A negative result must be combined with clinical observations, patient history, and epidemiological information.  Fact Sheet for Patients:    https://www.patel.info/  Fact Sheet for Healthcare Providers: https://hall.com/  This test is not yet approved or  cleared by the [place] FDA and has been authorized for detection and/or diagnosis of SARS-CoV-2 by FDA under an Emergency Use Authorization (EUA).  This EUA will remain in effect (meaning this test can be used) for the duration of the COVID-19 declaration under Section 564(b)(1) of the Act, 21 U.S.C. section 360bbb-3(b)(1), unless the authorization is terminated or revoked sooner.  Performed at [facility], [place] [address] [place], [place] [address]          Radiology Studies: DG Chest 2 View  Result Date: 10/17/2021 CLINICAL DATA:  Chest pain. EXAM: CHEST - 2 VIEW COMPARISON:  Chest radiograph dated 03/21/2020. FINDINGS: There is mild cardiomegaly with mild vascular congestion and interstitial edema. No focal consolidation, pleural effusion or pneumothorax. No acute osseous pathology. IMPRESSION: Mild cardiomegaly with mild vascular congestion and interstitial edema. Electronically Signed   By: Anner Crete M.D.   On: 10/17/2021 02:41        Scheduled Meds:  amitriptyline  10 mg Oral QHS   calcitRIOL  1 mcg Oral Q M,W,F-HD   Chlorhexidine Gluconate Cloth  6  each Topical Q0600   Chlorhexidine Gluconate Cloth  6 each Topical Q0600   cloNIDine  0.2 mg Oral BID   furosemide  80 mg Oral BID   gabapentin  300 mg Oral Q1200   gabapentin  600 mg Oral BID   heparin  5,000 Units Subcutaneous Q8H   insulin aspart  0-6 Units Subcutaneous TID WC   insulin NPH Human  15 Units Subcutaneous BID AC & HS   isosorbide-hydrALAZINE  1 tablet Oral TID   ketorolac  1 drop Left Eye QID   labetalol  300 mg Oral BID   prednisoLONE acetate  1 drop Left Eye TID   sodium chloride flush  3 mL Intravenous Q12H   Continuous Infusions:   LOS: 0 days    Time spent: 35 minutes     Braydon Kullman A Saachi Zale, MD [facility]   If 7PM-7AM, please contact night-coverage [email or web address]  10/18/2021, 4:10 PM

## 2021-10-18 NOTE — Progress Notes (Signed)
Patient back from HD c/o CP , 12 lead EKG done, Bidil and pain med given. Card NP paged awaiting called back. DR Regalado notified.

## 2021-10-18 NOTE — TOC Progression Note (Signed)
Transition of Care [facility]) - Progression Note    Patient Details  Name: [name] [name] MRN: [id number] Date of Birth: [birth year]  Transition of Care [facility]) CM/SW Contact  Zenon Mayo, RN Phone Number: 10/18/2021, 4:43 PM  Clinical Narrative:    from home,indep, htn crisis, HD patient MWF, benefit check for bidil in process. TOC following.         Expected Discharge Plan and Services                                                 Social Determinants of Health (SDOH) Interventions    Readmission Risk Interventions     No data to display

## 2021-10-19 ENCOUNTER — Inpatient Hospital Stay (HOSPITAL_COMMUNITY): Payer: Medicare Other

## 2021-10-19 ENCOUNTER — Telehealth (HOSPITAL_COMMUNITY): Payer: Self-pay | Admitting: Pharmacy Technician

## 2021-10-19 ENCOUNTER — Other Ambulatory Visit (HOSPITAL_COMMUNITY): Payer: Self-pay

## 2021-10-19 DIAGNOSIS — R079 Chest pain, unspecified: Secondary | ICD-10-CM | POA: Diagnosis not present

## 2021-10-19 DIAGNOSIS — I169 Hypertensive crisis, unspecified: Secondary | ICD-10-CM | POA: Diagnosis not present

## 2021-10-19 LAB — ECHOCARDIOGRAM COMPLETE
AR max vel: 1.79 cm2
AV Area VTI: 1.91 cm2
AV Area mean vel: 1.89 cm2
AV Mean grad: 6 mmHg
AV Peak grad: 11.8 mmHg
Ao pk vel: 1.72 m/s
Area-P 1/2: 3.54 cm2
Calc EF: 55 %
Height: 65 in
S' Lateral: 3.4 cm
Single Plane A2C EF: 56.8 %
Single Plane A4C EF: 55.5 %
Weight: 3329.83 oz

## 2021-10-19 LAB — HEPATIC FUNCTION PANEL
ALT: 13 U/L (ref 0–44)
AST: 13 U/L — ABNORMAL LOW (ref 15–41)
Albumin: 3.5 g/dL (ref 3.5–5.0)
Alkaline Phosphatase: 72 U/L (ref 38–126)
Bilirubin, Direct: <0.1 mg/dL (ref 0.0–0.2)
Total Bilirubin: 0.5 mg/dL (ref 0.3–1.2)
Total Protein: 6.9 g/dL (ref 6.5–8.1)

## 2021-10-19 LAB — LIPASE, BLOOD: Lipase: 39 U/L (ref 11–51)

## 2021-10-19 LAB — GLUCOSE, CAPILLARY
Glucose-Capillary: 181 mg/dL — ABNORMAL HIGH (ref 70–99)
Glucose-Capillary: 92 mg/dL (ref 70–99)

## 2021-10-19 MED ORDER — PANTOPRAZOLE SODIUM 40 MG IV SOLR
40.0000 mg | Freq: Two times a day (BID) | INTRAVENOUS | Status: DC
  Administered 2021-10-19 – 2021-10-21 (×3): 40 mg via INTRAVENOUS
  Filled 2021-10-19 (×4): qty 10

## 2021-10-19 MED ORDER — CLONIDINE HCL 0.2 MG PO TABS
0.2000 mg | ORAL_TABLET | Freq: Three times a day (TID) | ORAL | Status: DC
  Administered 2021-10-19 – 2021-10-20 (×5): 0.2 mg via ORAL
  Filled 2021-10-19 (×6): qty 1

## 2021-10-19 MED ORDER — GABAPENTIN 300 MG PO CAPS
300.0000 mg | ORAL_CAPSULE | Freq: Two times a day (BID) | ORAL | Status: DC
  Filled 2021-10-19 (×3): qty 1

## 2021-10-19 MED ORDER — PANTOPRAZOLE SODIUM 40 MG IV SOLR: 40.0000 mg | Freq: Two times a day (BID) | INTRAVENOUS | Status: DC

## 2021-10-19 NOTE — Progress Notes (Addendum)
PROGRESS NOTE    [name] [name]  GEZ:[id number] DOB: 1961-01-03 DOA: 10/17/2021 PCP: [name], MD   Brief Narrative: 61 year old with past medical history significant for ESRD on hemodialysis Monday Wednesday and Friday, hypertension, hyperlipidemia, diabetes presents with chest pain, associated with shortness of breath.  Blood pressure was noted to be elevated 270/116.  She took extra dose of clonidine.  She had a full hemodialysis today prior to admission. Patient presented with chest pain, admitted for hypertensive crisis. Now develops nausea, vomiting, KUB concern with ileus.    Assessment & Plan:   Principal Problem:   Hypertensive crisis Active Problems:   End stage renal disease ([place])   Chronic pain   Type 2 diabetes mellitus ([place])   1-Hypertensive crisis/urgency: She underwent hemodialysis 9/5. 9/06 Hydralazine and Imdur changed to Bidil.  Continue with clonidine, labetalol.  Increase clonidine to TID>   Chest pain;  Mild elevation troponin.  Report chest pain for 1 week., worse on exertion.  Cardiology consulted. ECHO Normal EF. No further cardiology plan.  Could have be related to HTN.  CTA negative for PE>   Nausea, vomiting;  -KUB showed ileus vs earlier SBO.  Change diet to NPO.  -Check Lipase, LFT normal.  -PRN zofran.  IV protonix.  Will proceed with CT abdomen.   Headaches , nausea.  Plan to proceed with MRI brain.   ESRD on hemodialysis Had extra hemodialysis in 9/5, HD 9/06  Chronic pain: Continue with cyclobenzaprine and gabapentin Reduce dose/   Diabetes: type 2 with Hyperglycemia.  Continue NPH SSI.  A1c 8.2  Estimated body mass index is 34.63 kg/m as calculated from the following:   Height as of this encounter: 5\' 5"  (1.651 m).   Weight as of this encounter: 94.4 kg.   DVT prophylaxis: Heparin  Code Status: Full code Family Communication: Care discussed with patient.  Disposition Plan:  Status is: Observation The patient  remains OBS appropriate and will d/c before 2 midnights.    Consultants:  Nephrology Cardiology   Procedures:  ECHO  Antimicrobials:    Subjective: She is not feeling well today. She is complaining of headaches. Report multiples episode of vomiting.    Objective: Vitals:   10/19/21 1000 10/19/21 1100 10/19/21 1200 10/19/21 1355  BP:  (!) 145/62  139/65  Pulse:    66  Resp: 20 18 18 16   Temp:    98.3 F (36.8 C)  TempSrc:    Oral  SpO2:      Weight:      Height:        Intake/Output Summary (Last 24 hours) at 10/19/2021 1533 Last data filed at 10/19/2021 1200 Gross per 24 hour  Intake 600 ml  Output --  Net 600 ml    Filed Weights   10/18/21 0430 10/18/21 1133 10/18/21 1502  Weight: 95.3 kg 95.3 kg 94.4 kg    Examination:  General exam: NAD Respiratory system: CTA Cardiovascular system: S 1, S 2 RRR Gastrointestinal system: BS present, soft nt Central nervous system: alert Extremities: no edema   Data Reviewed: I have personally reviewed following labs and imaging studies  CBC: Recent Labs  Lab 10/17/21 0207 10/18/21 0921  WBC 10.5 12.5*  HGB 10.0* 10.5*  HCT 29.1* 29.4*  MCV 86.9 83.5  PLT 214 650    Basic Metabolic Panel: Recent Labs  Lab 10/17/21 0207 10/18/21 0921  NA 142 137  K 3.5 3.9  CL 88* 91*  CO2 41* 31  GLUCOSE 215*  170*  BUN 25* 26*  CREATININE 4.66* 4.60*  CALCIUM 9.1 9.7    GFR: Estimated Creatinine Clearance: 14.8 mL/min (A) (by C-G formula based on SCr of 4.6 mg/dL (H)). Liver Function Tests: Recent Labs  Lab 10/19/21 1327  AST 13*  ALT 13  ALKPHOS 72  BILITOT 0.5  PROT 6.9  ALBUMIN 3.5   Recent Labs  Lab 10/19/21 1327  LIPASE 39   No results for input(s): "AMMONIA" in the last 168 hours. Coagulation Profile: Recent Labs  Lab 10/17/21 0207  INR 1.0    Cardiac Enzymes: No results for input(s): "CKTOTAL", "CKMB", "CKMBINDEX", "TROPONINI" in the last 168 hours. BNP (last 3 results) No results for  input(s): "PROBNP" in the last 8760 hours. HbA1C: Recent Labs    10/17/21 1200  HGBA1C 8.2*    CBG: Recent Labs  Lab 10/18/21 0612 10/18/21 1707 10/18/21 2124 10/19/21 0549 10/19/21 1201  GLUCAP 180* 154* 342* 181* 92    Lipid Profile: No results for input(s): "CHOL", "HDL", "LDLCALC", "TRIG", "CHOLHDL", "LDLDIRECT" in the last 72 hours. Thyroid Function Tests: No results for input(s): "TSH", "T4TOTAL", "FREET4", "T3FREE", "THYROIDAB" in the last 72 hours. Anemia Panel: No results for input(s): "VITAMINB12", "FOLATE", "FERRITIN", "TIBC", "IRON", "RETICCTPCT" in the last 72 hours. Sepsis Labs: No results for input(s): "PROCALCITON", "LATICACIDVEN" in the last 168 hours.  Recent Results (from the past 240 hour(s))  SARS Coronavirus 2 by RT PCR (hospital order, performed in [facility] hospital lab) *cepheid single result test* Anterior Nasal Swab     Status: None   Collection Time: 10/17/21  7:50 AM   Specimen: Anterior Nasal Swab  Result Value Ref Range Status   SARS Coronavirus 2 by RT PCR NEGATIVE NEGATIVE Final    Comment: (NOTE) SARS-CoV-2 target nucleic acids are NOT DETECTED.  The SARS-CoV-2 RNA is generally detectable in upper and lower respiratory specimens during the acute phase of infection. The lowest concentration of SARS-CoV-2 viral copies this assay can detect is 250 copies / mL. A negative result does not preclude SARS-CoV-2 infection and should not be used as the sole basis for treatment or other patient management decisions.  A negative result may occur with improper specimen collection / handling, submission of specimen other than nasopharyngeal swab, presence of viral mutation(s) within the areas targeted by this assay, and inadequate number of viral copies (<250 copies / mL). A negative result must be combined with clinical observations, patient history, and epidemiological information.  Fact Sheet for Patients:    https://www.patel.info/  Fact Sheet for Healthcare Providers: https://hall.com/  This test is not yet approved or  cleared by the [place] FDA and has been authorized for detection and/or diagnosis of SARS-CoV-2 by FDA under an Emergency Use Authorization (EUA).  This EUA will remain in effect (meaning this test can be used) for the duration of the COVID-19 declaration under Section 564(b)(1) of the Act, 21 U.S.C. section 360bbb-3(b)(1), unless the authorization is terminated or revoked sooner.  Performed at [facility], [place] [address] [place], [place] [address]          Radiology Studies: DG Abd 1 View  Result Date: 10/19/2021 CLINICAL DATA:  Vomiting, dizziness, nausea. EXAM: ABDOMEN - 1 VIEW COMPARISON:  CT chest 10/18/2021, 09/06/2009 KUB FINDINGS: There is residual contrast in the urinary bladder following CT chest on 10/18/2021. There is mild prominence of small bowel loops, consistent with ileus or early small bowel obstruction. Colonic loops are normal in caliber. No evidence for organomegaly  or free intraperitoneal air. Surgical clips are present in the RIGHT UPPER QUADRANT of the abdomen. IMPRESSION: Mildly prominent loops of small bowel consistent with ileus or early small bowel obstruction. Electronically Signed   By: Nolon Nations M.D.   On: 10/19/2021 14:32   ECHOCARDIOGRAM COMPLETE  Result Date: 10/19/2021    ECHOCARDIOGRAM REPORT   Patient Name:   [name] [name] Corzine Date of Exam: 10/19/2021 Medical Rec #:  [id number]     Height:       65.0 in Accession #:    [id number]    Weight:       208.1 lb Date of Birth:  19-Dec-1960     BSA:          2.012 m Patient Age:    14 years      BP:           183/68 mmHg Patient Gender: F             HR:           67 bpm. Exam Location:  Inpatient Procedure: 2D Echo Indications:    Chest Pain  History:        Patient has prior history of Echocardiogram examinations, most                  recent 08/07/2018. Abnormal ECG, Signs/Symptoms:Chest Pain and                 Shortness of Breath; Risk Factors:Dyslipidemia, Diabetes and                 Hypertension.  Sonographer:    Harvie Junior Referring Phys: [id number] Lily Kocher  Sonographer Comments: Image acquisition challenging due to patient body habitus. IMPRESSIONS  1. Left ventricular ejection fraction, by estimation, is 60 to 65%. The left ventricle has normal function. The left ventricle has no regional wall motion abnormalities. There is moderate concentric left ventricular hypertrophy. Left ventricular diastolic parameters are consistent with Grade I diastolic dysfunction (impaired relaxation). Elevated left atrial pressure.  2. Right ventricular systolic function is normal. The right ventricular size is normal.  3. Left atrial size was mildly dilated.  4. The mitral valve is normal in structure. Trivial mitral valve regurgitation. No evidence of mitral stenosis.  5. The aortic valve is tricuspid. Aortic valve regurgitation is trivial. Aortic valve sclerosis is present, with no evidence of aortic valve stenosis. FINDINGS  Left Ventricle: Left ventricular ejection fraction, by estimation, is 60 to 65%. The left ventricle has normal function. The left ventricle has no regional wall motion abnormalities. The left ventricular internal cavity size was normal in size. There is  moderate concentric left ventricular hypertrophy. Left ventricular diastolic parameters are consistent with Grade I diastolic dysfunction (impaired relaxation). Elevated left atrial pressure. Right Ventricle: The right ventricular size is normal. Right ventricular systolic function is normal. Left Atrium: Left atrial size was mildly dilated. Right Atrium: Right atrial size was normal in size. Pericardium: There is no evidence of pericardial effusion. Mitral Valve: The mitral valve is normal in structure. Trivial mitral valve regurgitation. No evidence of mitral valve stenosis.  Tricuspid Valve: The tricuspid valve is normal in structure. Tricuspid valve regurgitation is trivial. No evidence of tricuspid stenosis. Aortic Valve: The aortic valve is tricuspid. Aortic valve regurgitation is trivial. Aortic valve sclerosis is present, with no evidence of aortic valve stenosis. Aortic valve mean gradient measures 6.0 mmHg. Aortic valve peak gradient measures 11.8 mmHg.  Aortic valve area, by VTI  measures 1.91 cm. Pulmonic Valve: The pulmonic valve was normal in structure. Pulmonic valve regurgitation is not visualized. No evidence of pulmonic stenosis. Aorta: The aortic root is normal in size and structure. Venous: The inferior vena cava was not well visualized. IAS/Shunts: The interatrial septum was not well visualized.  LEFT VENTRICLE PLAX 2D LVIDd:         4.80 cm      Diastology LVIDs:         3.40 cm      LV e' medial:    3.37 cm/s LV PW:         1.20 cm      LV E/e' medial:  32.3 LV IVS:        1.20 cm      LV e' lateral:   4.46 cm/s LVOT diam:     1.80 cm      LV E/e' lateral: 24.4 LV SV:         64 LV SV Index:   32 LVOT Area:     2.54 cm  LV Volumes (MOD) LV vol d, MOD A2C: 88.2 ml LV vol d, MOD A4C: 108.0 ml LV vol s, MOD A2C: 38.1 ml LV vol s, MOD A4C: 48.1 ml LV SV MOD A2C:     50.1 ml LV SV MOD A4C:     108.0 ml LV SV MOD BP:      54.4 ml RIGHT VENTRICLE RV Basal diam:  3.40 cm RV Mid diam:    2.60 cm RV S prime:     14.40 cm/s TAPSE (M-mode): 3.0 cm LEFT ATRIUM             Index        RIGHT ATRIUM           Index LA diam:        3.40 cm 1.69 cm/m   RA Area:     10.80 cm LA Vol (A2C):   52.5 ml 26.10 ml/m  RA Volume:   21.50 ml  10.69 ml/m LA Vol (A4C):   68.6 ml 34.10 ml/m LA Biplane Vol: 61.7 ml 30.67 ml/m  AORTIC VALVE                     PULMONIC VALVE AV Area (Vmax):    1.79 cm      PV Vmax:       1.10 m/s AV Area (Vmean):   1.89 cm      PV Peak grad:  4.8 mmHg AV Area (VTI):     1.91 cm AV Vmax:           172.00 cm/s AV Vmean:          118.000 cm/s AV VTI:             0.335 m AV Peak Grad:      11.8 mmHg AV Mean Grad:      6.0 mmHg LVOT Vmax:         121.00 cm/s LVOT Vmean:        87.600 cm/s LVOT VTI:          0.251 m LVOT/AV VTI ratio: 0.75  AORTA Ao Root diam: 2.80 cm Ao Asc diam:  3.00 cm MITRAL VALVE MV Area (PHT): 3.54 cm     SHUNTS MV Decel Time: 214 msec     Systemic VTI:  0.25 m MV E velocity: 109.00 cm/s  Systemic Diam: 1.80 cm MV A velocity: 119.00 cm/s  MV E/A ratio:  0.92 Kirk Ruths MD Electronically signed by Kirk Ruths MD Signature Date/Time: 10/19/2021/12:08:30 PM    Final    CT Angio Chest Pulmonary Embolism (PE) W or WO Contrast  Result Date: 10/18/2021 CLINICAL DATA:  Shortness of breath and hypertension. EXAM: CT ANGIOGRAPHY CHEST WITH CONTRAST TECHNIQUE: Multidetector CT imaging of the chest was performed using the standard protocol during bolus administration of intravenous contrast. Multiplanar CT image reconstructions and MIPs were obtained to evaluate the vascular anatomy. RADIATION DOSE REDUCTION: This exam was performed according to the departmental dose-optimization program which includes automated exposure control, adjustment of the mA and/or kV according to patient size and/or use of iterative reconstruction technique. CONTRAST:  51mL OMNIPAQUE IOHEXOL 350 MG/ML SOLN COMPARISON:  CT scan from 2017 FINDINGS: Cardiovascular: The heart is borderline enlarged. There appears to be a at least moderate left ventricular hypertrophy. The aorta is normal in caliber. No dissection or atherosclerotic calcification. The pulmonary arteries are well opacified. No filling defects to suggest pulmonary embolism. Mediastinum/Nodes: No mediastinal or hilar mass or lymphadenopathy. The esophagus is grossly normal. Lungs/Pleura: No acute pulmonary findings. No infiltrates, edema or effusions. No pulmonary lesions. Upper Abdomen: No significant upper abdominal findings. Musculoskeletal: No breast masses, supraclavicular or axillary adenopathy. The thyroid gland  is unremarkable. The bony thorax is intact. Review of the MIP images confirms the above findings. IMPRESSION: 1. No CT findings for pulmonary embolism. 2. Normal thoracic aorta. 3. Borderline cardiomegaly with left ventricular hypertrophy. 4. No acute pulmonary findings. Electronically Signed   By: Marijo Sanes M.D.   On: 10/18/2021 21:11        Scheduled Meds:  amitriptyline  10 mg Oral QHS   calcitRIOL  1 mcg Oral Q M,W,F-HD   Chlorhexidine Gluconate Cloth  6 each Topical Q0600   Chlorhexidine Gluconate Cloth  6 each Topical Q0600   cloNIDine  0.2 mg Oral Q8H   furosemide  80 mg Oral BID   gabapentin  300 mg Oral BID   heparin  5,000 Units Subcutaneous Q8H   insulin aspart  0-6 Units Subcutaneous TID WC   insulin NPH Human  15 Units Subcutaneous BID AC & HS   isosorbide-hydrALAZINE  1 tablet Oral TID   ketorolac  1 drop Left Eye QID   labetalol  300 mg Oral BID   prednisoLONE acetate  1 drop Left Eye TID   sodium chloride flush  3 mL Intravenous Q12H   Continuous Infusions:   LOS: 1 day    Time spent: 35 minutes     Thurman Sarver A Elizzie Westergard, MD [facility]   If 7PM-7AM, please contact night-coverage [email or web address]  10/19/2021, 3:33 PM

## 2021-10-19 NOTE — Progress Notes (Signed)
[facility] Progress Note   Subjective:  Seen in room - looks awful today, actively vomiting. Also reports headache and occ dizziness. BP variable - hasn't been given AM meds yet. Per cardiology note, echo was good - normal EF, no WMAs. Spoke to Financial controller to have RN give another dose of IV  Zofran and AM meds.   Objective Vitals:   10/18/21 1950 10/18/21 2144 10/19/21 0430 10/19/21 0900  BP: (!) 154/65 (!) 144/85 (!) 183/68 (!) 149/72  Pulse: 90  64   Resp: (!) _0 Temp: 98.8 F (37.1 C)  98.1 F (36.7 C)   TempSrc: Oral  Oral   SpO2: 96%  96%   Weight:      Height:       Physical Exam General: Actively vomiting. Room air. Heart: RRR; no murmur Lungs: CTAB; no rales or wheezing Abdomen: soft Extremities: no LE edema Dialysis Access:  LUE AVF + bruit  Additional Objective Labs: Basic Metabolic Panel: Recent Labs  Lab 10/17/21 0207 10/18/21 0921  NA 142 137  K 3.5 3.9  CL 88* 91*  CO2 41* 31  GLUCOSE 215* 170*  BUN 25* 26*  CREATININE 4.66* 4.60*  CALCIUM 9.1 9.7   CBC: Recent Labs  Lab 10/17/21 0207 10/18/21 0921  WBC 10.5 12.5*  HGB 10.0* 10.5*  HCT 29.1* 29.4*  MCV 86.9 83.5  PLT 214 191   CBG: Recent Labs  Lab 10/17/21 2100 10/18/21 0612 10/18/21 1707 10/18/21 2124 10/19/21 0549  GLUCAP 278* 180* 154* 342* 181*   Studies/Results: CT Angio Chest Pulmonary Embolism (PE) W or WO Contrast  Result Date: 10/18/2021 CLINICAL DATA:  Shortness of breath and hypertension. EXAM: CT ANGIOGRAPHY CHEST WITH CONTRAST TECHNIQUE: Multidetector CT imaging of the chest was performed using the standard protocol during bolus administration of intravenous contrast. Multiplanar CT image reconstructions and MIPs were obtained to evaluate the vascular anatomy. RADIATION DOSE REDUCTION: This exam was performed according to the departmental dose-optimization program which includes automated exposure control, adjustment of the mA and/or kV  according to patient size and/or use of iterative reconstruction technique. CONTRAST:  30m OMNIPAQUE IOHEXOL 350 MG/ML SOLN COMPARISON:  CT scan from 2017 FINDINGS: Cardiovascular: The heart is borderline enlarged. There appears to be a at least moderate left ventricular hypertrophy. The aorta is normal in caliber. No dissection or atherosclerotic calcification. The pulmonary arteries are well opacified. No filling defects to suggest pulmonary embolism. Mediastinum/Nodes: No mediastinal or hilar mass or lymphadenopathy. The esophagus is grossly normal. Lungs/Pleura: No acute pulmonary findings. No infiltrates, edema or effusions. No pulmonary lesions. Upper Abdomen: No significant upper abdominal findings. Musculoskeletal: No breast masses, supraclavicular or axillary adenopathy. The thyroid gland is unremarkable. The bony thorax is intact. Review of the MIP images confirms the above findings. IMPRESSION: 1. No CT findings for pulmonary embolism. 2. Normal thoracic aorta. 3. Borderline cardiomegaly with left ventricular hypertrophy. 4. No acute pulmonary findings. Electronically Signed   By: PMarijo SanesM.D.   On: 10/18/2021 21:11    Medications:   amitriptyline  10 mg Oral QHS   calcitRIOL  1 mcg Oral Q M,W,F-HD   Chlorhexidine Gluconate Cloth  6 each Topical Q0600   Chlorhexidine Gluconate Cloth  6 each Topical Q0600   cloNIDine  0.2 mg Oral Q8H   furosemide  80 mg Oral BID   gabapentin  300 mg Oral Q1200   gabapentin  600 mg Oral BID   heparin  5,000 Units Subcutaneous  Q8H   insulin aspart  0-6 Units Subcutaneous TID WC   insulin NPH Human  15 Units Subcutaneous BID AC & HS   isosorbide-hydrALAZINE  1 tablet Oral TID   ketorolac  1 drop Left Eye QID   labetalol  300 mg Oral BID   prednisoLONE acetate  1 drop Left Eye TID   sodium chloride flush  3 mL Intravenous Q12H    Dialysis Orders: MWF [facility] 4h  400/500  93.2kg  3K/2.5Ca bath  P2   Hep none  LUA AVF - last HD 9/4 post HD  95.4kg - high wt gains 3-7 kg - mircera 60 q2, last 9/4, due 9/18 - calcitriol 1.0 ug tiw po   Assessment/Plan: HTN urgency/pulm edema: Volume up here with HTN and pulm edema on initial CXR. Chronic large gains, worse over the weekend, but was previously reaching her dry weight (93.7) until it was lowered on 9/1-- hasn't met the 93.2kg yet. Dialyzed 9/5, 9/6 to try to off load volume, had cramping and CP both times. Cardiology consulted, EKG stable, Echo without WMA per notes. Plan is for outpatient stress test, but to work on controlling BP for now. CT angio last night without pulm edema or PE. I think the best bet for her is to raise EDW back up to 94kg range and escalate meds. Hydralazine changed to BiDil yesterday - hard to tell if working yet, hasn't had AM meds. ESRD: Usual MWF schedule. S/p HD extra HD to attempt more UF - cramping/CP. Next HD tomorrow (9/8). 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 ESA not due yet. Secondary HPTH: Ca ok, Phos pending. Continue home meds. T2DM: Insulin per primary. She is also on a pretty hefty dose of gabapentin, would rec reducing dose given ESRD. Chest pain: Likely demand ischemia. Last stress test normal in 2020. EKG/Echo ok here. Cardiology consulted, plan is for outpatient stress test. Vomiting: Unclear etiology, ?HTN v. med effect v. other etiology. WBC up slightly. She is afebrile, no severe abd pain. S/p IV zofran early this am, and getting another dose for her now. Per primary.  Veneta Penton, PA-C 10/19/2021, 10:30 AM  [employer]

## 2021-10-19 NOTE — Telephone Encounter (Signed)
Pharmacy Patient Advocate Encounter  Insurance verification completed.    The patient is insured through [employer] Medicare Part D   The patient is currently admitted and ran test claims for the following: isosorbide-hydralazine (Bidil).  Copays and coinsurance results were relayed to Inpatient clinical team. 

## 2021-10-19 NOTE — TOC Benefit Eligibility Note (Signed)
Patient Teacher, English as a foreign language completed.    The patient is currently admitted and upon discharge could be taking isosorbide-hydralazine (Bidil) 20-37.5 mg.  The current 30 day co-pay is $0.00.   The patient is insured through [place], [place] Patient Advocate Specialist [place] Patient Advocate Team Direct Number: [phone]  Fax: [phone]

## 2021-10-19 NOTE — Plan of Care (Signed)
  Problem: Coping: Goal: Level of anxiety will decrease Outcome: Progressing   Problem: Elimination: Goal: Will not experience complications related to urinary retention Outcome: Progressing   Problem: Safety: Goal: Ability to remain free from injury will improve Outcome: Progressing   

## 2021-10-19 NOTE — Plan of Care (Signed)
  Problem: Activity: Goal: Risk for activity intolerance will decrease Outcome: Progressing   Problem: Coping: Goal: Level of anxiety will decrease Outcome: Progressing   Problem: Elimination: Goal: Will not experience complications related to urinary retention Outcome: Progressing   Problem: Safety: Goal: Ability to remain free from injury will improve 10/19/2021 2114 by Barton Dubois, RN Outcome: Progressing 10/19/2021 2112 by Barton Dubois, RN Outcome: Progressing

## 2021-10-19 NOTE — Progress Notes (Signed)
Cardiologist:  Revankar   Subjective:  Still feels poorly with nausea and atypical pain in chest Bedside echo being done normal EF no RWMA;s   Objective:  Vitals:   10/18/21 1609 10/18/21 1950 10/18/21 2144 10/19/21 0430  BP: (!) 134/53 (!) 154/65 (!) 144/85 (!) 183/68  Pulse:  90  64  Resp:  (!) 21 16 14   Temp:  98.8 F (37.1 C)  98.1 F (36.7 C)  TempSrc:  Oral  Oral  SpO2:  96%  96%  Weight:      Height:        Intake/Output from previous day:  Intake/Output Summary (Last 24 hours) at 10/19/2021 0924 Last data filed at 10/19/2021 0436 Gross per 24 hour  Intake 480 ml  Output 1230 ml  Net -750 ml    Physical Exam: Obese black female Lungs clear Distant heart sounds no rub SEM AS/AV sclerosis  Fistula LUE  Plus one edema  Lab Results: Basic Metabolic Panel: Recent Labs    10/17/21 0207 10/18/21 0921  NA 142 137  K 3.5 3.9  CL 88* 91*  CO2 41* 31  GLUCOSE 215* 170*  BUN 25* 26*  CREATININE 4.66* 4.60*  CALCIUM 9.1 9.7   Liver Function Tests: No results for input(s): "AST", "ALT", "ALKPHOS", "BILITOT", "PROT", "ALBUMIN" in the last 72 hours. No results for input(s): "LIPASE", "AMYLASE" in the last 72 hours. CBC: Recent Labs    10/17/21 0207 10/18/21 0921  WBC 10.5 12.5*  HGB 10.0* 10.5*  HCT 29.1* 29.4*  MCV 86.9 83.5  PLT 214 191   Cardiac Enzymes: No results for input(s): "CKTOTAL", "CKMB", "CKMBINDEX", "TROPONINI" in the last 72 hours. BNP: Invalid input(s): "POCBNP" D-Dimer: Recent Labs    10/18/21 1726  DDIMER 3.07*   Hemoglobin A1C: Recent Labs    10/17/21 1200  HGBA1C 8.2*     Imaging: CT Angio Chest Pulmonary Embolism (PE) W or WO Contrast  Result Date: 10/18/2021 CLINICAL DATA:  Shortness of breath and hypertension. EXAM: CT ANGIOGRAPHY CHEST WITH CONTRAST TECHNIQUE: Multidetector CT imaging of the chest was performed using the standard protocol during bolus administration of intravenous contrast. Multiplanar CT image  reconstructions and MIPs were obtained to evaluate the vascular anatomy. RADIATION DOSE REDUCTION: This exam was performed according to the departmental dose-optimization program which includes automated exposure control, adjustment of the mA and/or kV according to patient size and/or use of iterative reconstruction technique. CONTRAST:  20mL OMNIPAQUE IOHEXOL 350 MG/ML SOLN COMPARISON:  CT scan from 2017 FINDINGS: Cardiovascular: The heart is borderline enlarged. There appears to be a at least moderate left ventricular hypertrophy. The aorta is normal in caliber. No dissection or atherosclerotic calcification. The pulmonary arteries are well opacified. No filling defects to suggest pulmonary embolism. Mediastinum/Nodes: No mediastinal or hilar mass or lymphadenopathy. The esophagus is grossly normal. Lungs/Pleura: No acute pulmonary findings. No infiltrates, edema or effusions. No pulmonary lesions. Upper Abdomen: No significant upper abdominal findings. Musculoskeletal: No breast masses, supraclavicular or axillary adenopathy. The thyroid gland is unremarkable. The bony thorax is intact. Review of the MIP images confirms the above findings. IMPRESSION: 1. No CT findings for pulmonary embolism. 2. Normal thoracic aorta. 3. Borderline cardiomegaly with left ventricular hypertrophy. 4. No acute pulmonary findings. Electronically Signed   By: Marijo Sanes M.D.   On: 10/18/2021 21:11    Cardiac Studies:  ECG: SR LVH nonspecific lateral T wave changes    Telemetry: NSR  Echo: Preliminary LVH normal EF 60-65%  Medications:  amitriptyline  10 mg Oral QHS   calcitRIOL  1 mcg Oral Q M,W,F-HD   Chlorhexidine Gluconate Cloth  6 each Topical Q0600   Chlorhexidine Gluconate Cloth  6 each Topical Q0600   cloNIDine  0.2 mg Oral Q8H   furosemide  80 mg Oral BID   gabapentin  300 mg Oral Q1200   gabapentin  600 mg Oral BID   heparin  5,000 Units Subcutaneous Q8H   insulin aspart  0-6 Units Subcutaneous TID WC    insulin NPH Human  15 Units Subcutaneous BID AC & HS   isosorbide-hydrALAZINE  1 tablet Oral TID   ketorolac  1 drop Left Eye QID   labetalol  300 mg Oral BID   prednisoLONE acetate  1 drop Left Eye TID   sodium chloride flush  3 mL Intravenous Q12H      Assessment/Plan:   Chest Pain: atypical r/o tropnin normal x 3 nonspecific ST changes LVH on ECG TTE with normal EF no RWMAls In setting of HTN urgency dialysis and history of esophageal spasm Normal myovue June 202 and in 2015 Can have outpatient myovue f/u with Dr Geraldo Pitter  HTN: improved with extra dialysis hydralazine changed to bidil improved continue labetalol and clonidine with lasix Per nephrology and primary service CRF:  fistula LUE good dialysis again in am  DM:  poor control per primary service   Jenkins Rouge 10/19/2021, 9:24 AM

## 2021-10-20 ENCOUNTER — Inpatient Hospital Stay (HOSPITAL_COMMUNITY): Payer: Medicare Other

## 2021-10-20 DIAGNOSIS — N186 End stage renal disease: Secondary | ICD-10-CM

## 2021-10-20 DIAGNOSIS — R112 Nausea with vomiting, unspecified: Secondary | ICD-10-CM

## 2021-10-20 DIAGNOSIS — I169 Hypertensive crisis, unspecified: Secondary | ICD-10-CM | POA: Diagnosis not present

## 2021-10-20 LAB — RENAL FUNCTION PANEL
Albumin: 3.3 g/dL — ABNORMAL LOW (ref 3.5–5.0)
Anion gap: 14 (ref 5–15)
BUN: 38 mg/dL — ABNORMAL HIGH (ref 6–20)
CO2: 26 mmol/L (ref 22–32)
Calcium: 9.3 mg/dL (ref 8.9–10.3)
Chloride: 95 mmol/L — ABNORMAL LOW (ref 98–111)
Creatinine, Ser: 7.03 mg/dL — ABNORMAL HIGH (ref 0.44–1.00)
GFR, Estimated: 6 mL/min — ABNORMAL LOW (ref >60)
Glucose, Bld: 210 mg/dL — ABNORMAL HIGH (ref 70–99)
Phosphorus: 5.6 mg/dL — ABNORMAL HIGH (ref 2.5–4.6)
Potassium: 3.8 mmol/L (ref 3.5–5.1)
Sodium: 135 mmol/L (ref 135–145)

## 2021-10-20 LAB — CBC
HCT: 27.4 % — ABNORMAL LOW (ref 36.0–46.0)
Hemoglobin: 9.6 g/dL — ABNORMAL LOW (ref 12.0–15.0)
MCH: 29.9 pg (ref 26.0–34.0)
MCHC: 35 g/dL (ref 30.0–36.0)
MCV: 85.4 fL (ref 80.0–100.0)
Platelets: 228 10*3/uL (ref 150–400)
RBC: 3.21 MIL/uL — ABNORMAL LOW (ref 3.87–5.11)
RDW: 17.6 % — ABNORMAL HIGH (ref 11.5–15.5)
WBC: 11.5 10*3/uL — ABNORMAL HIGH (ref 4.0–10.5)
nRBC: 0 % (ref 0.0–0.2)

## 2021-10-20 LAB — GLUCOSE, CAPILLARY
Glucose-Capillary: 163 mg/dL — ABNORMAL HIGH (ref 70–99)
Glucose-Capillary: 209 mg/dL — ABNORMAL HIGH (ref 70–99)
Glucose-Capillary: 157 mg/dL — ABNORMAL HIGH (ref 70–99)
Glucose-Capillary: 185 mg/dL — ABNORMAL HIGH (ref 70–99)
Glucose-Capillary: 429 mg/dL — ABNORMAL HIGH (ref 70–99)

## 2021-10-20 LAB — MAGNESIUM: Magnesium: 1.8 mg/dL (ref 1.7–2.4)

## 2021-10-20 MED ORDER — POLYETHYLENE GLYCOL 3350 17 G PO PACK
17.0000 g | PACK | Freq: Two times a day (BID) | ORAL | Status: DC
  Administered 2021-10-20 (×2): 17 g via ORAL
  Filled 2021-10-20 (×3): qty 1

## 2021-10-20 MED ORDER — SODIUM CHLORIDE 0.9 % IV SOLN: INTRAVENOUS | Status: DC

## 2021-10-20 NOTE — Progress Notes (Signed)
   10/20/21 1604  Mobility  Activity Ambulated independently in hallway  Level of Assistance Independent  Assistive Device None  Distance Ambulated (ft) 500 ft  Activity Response Tolerated well  $Mobility charge 1 Mobility   Mobility Specialist Progress Note  Post-Mobility: 76 HR, 139/67 BP  Pt was in bed and agreeable. X1 seated break d/t c/o dizziness. Returned to bed w/ all needs met and call bell in reach.   Lucious Groves Mobility Specialist

## 2021-10-20 NOTE — Progress Notes (Signed)
PROGRESS NOTE    GEORGIAN MCCLORY  GEX:[id number] DOB: October 30, 1960 DOA: 10/17/2021 PCP: [name], MD   Brief Narrative: 61 year old with past medical history significant for ESRD on hemodialysis Monday Wednesday and Friday, hypertension, hyperlipidemia, diabetes presents with chest pain, associated with shortness of breath.  Blood pressure was noted to be elevated 270/116.  She took extra dose of clonidine.  She had a full hemodialysis today prior to admission. Patient presented with chest pain, admitted for hypertensive crisis. Now develops nausea, vomiting, KUB concern with ileus.    Assessment & Plan:   Principal Problem:   Hypertensive crisis Active Problems:   End stage renal disease ([place])   Chronic pain   Type 2 diabetes mellitus ([place])   1-Hypertensive Crisis/Urgency: She underwent hemodialysis 9/5. 9/06 Hydralazine and Imdur changed to Bidil.  Continue with clonidine, labetalol.  Increase clonidine to TID>   Chest pain;  Mild elevation troponin.  Report chest pain for 1 week., worse on exertion.  Cardiology consulted. ECHO Normal EF. No further cardiology plan.  Could have be related to HTN.  CTA negative for PE>   Nausea, vomiting; gastritis vs ileus early SBO -KUB showed ileus vs earlier SBO.  -Lipase, LFT normal.  -PRN zofran.  -IV protonix.  -CT abdomen pelvis: Diffusely thickened stomach not seen previously, could indicate evidence of gastritis, peptic ulcer disease or infiltrating disease. There are no acute inflammatory changes or free air. Correlate clinically and consider endoscopy if clinically warranted. Mild dilatation of the proximal jejunal segments, normal caliber remaining small bowel. Probable ileus or enteritis etiology, less likely low-grade obstruction. No visible transitional segment. GI consulted.   Headaches , nausea.  MRI negative for stroke.   ESRD on hemodialysis Had extra hemodialysis in 9/5, HD 9/06 Continue with HD>   Chronic  pain: Continue with cyclobenzaprine and gabapentin Reduce dose/   Diabetes: type 2 with Hyperglycemia.  Continue NPH SSI.  A1c 8.2  Estimated body mass index is 34.49 kg/m as calculated from the following:   Height as of this encounter: 5\' 5"  (1.651 m).   Weight as of this encounter: 94 kg.   DVT prophylaxis: Heparin  Code Status: Full code Family Communication: Care discussed with patient.  Disposition Plan:  Status is: Observation The patient remains OBS appropriate and will d/c before 2 midnights.    Consultants:  Nephrology Cardiology   Procedures:  ECHO  Antimicrobials:    Subjective: She denies further vomiting.  Denies abdominal pain. No passing gas, has not had BM   Objective: Vitals:   10/20/21 1130 10/20/21 1200 10/20/21 1212 10/20/21 1316  BP: 136/69  (!) 144/68 127/67  Pulse:  (!) 2  75  Resp:    16  Temp:    97.7 F (36.5 C)  TempSrc:    Oral  SpO2:    100%  Weight:      Height:        Intake/Output Summary (Last 24 hours) at 10/20/2021 1455 Last data filed at 10/19/2021 2000 Gross per 24 hour  Intake 0 ml  Output --  Net 0 ml    Filed Weights   10/18/21 1502 10/20/21 0537 10/20/21 0754  Weight: 94.4 kg 94 kg 94 kg    Examination:  General exam: NAD Respiratory system: CTA Cardiovascular system: S 1, S 2 RRR Gastrointestinal system: BS present, soft, NT Central nervous system: Alert, follows command Extremities: no edema   Data Reviewed: I have personally reviewed following labs and imaging studies  CBC: Recent  Labs  Lab 10/17/21 0207 10/18/21 0921 10/20/21 0837  WBC 10.5 12.5* 11.5*  HGB 10.0* 10.5* 9.6*  HCT 29.1* 29.4* 27.4*  MCV 86.9 83.5 85.4  PLT 214 191 324    Basic Metabolic Panel: Recent Labs  Lab 10/17/21 0207 10/18/21 0921 10/20/21 0837  NA 142 137 135  K 3.5 3.9 3.8  CL 88* 91* 95*  CO2 41* 31 26  GLUCOSE 215* 170* 210*  BUN 25* 26* 38*  CREATININE 4.66* 4.60* 7.03*  CALCIUM 9.1 9.7 9.3  MG   --   --  1.8  PHOS  --   --  5.6*    GFR: Estimated Creatinine Clearance: 9.6 mL/min (A) (by C-G formula based on SCr of 7.03 mg/dL (H)). Liver Function Tests: Recent Labs  Lab 10/19/21 1327 10/20/21 0837  AST 13*  --   ALT 13  --   ALKPHOS 72  --   BILITOT 0.5  --   PROT 6.9  --   ALBUMIN 3.5 3.3*    Recent Labs  Lab 10/19/21 1327  LIPASE 39    No results for input(s): "AMMONIA" in the last 168 hours. Coagulation Profile: Recent Labs  Lab 10/17/21 0207  INR 1.0    Cardiac Enzymes: No results for input(s): "CKTOTAL", "CKMB", "CKMBINDEX", "TROPONINI" in the last 168 hours. BNP (last 3 results) No results for input(s): "PROBNP" in the last 8760 hours. HbA1C: No results for input(s): "HGBA1C" in the last 72 hours.  CBG: Recent Labs  Lab 10/19/21 0549 10/19/21 1201 10/20/21 0152 10/20/21 0559 10/20/21 1336  GLUCAP 181* 92 163* 209* 157*    Lipid Profile: No results for input(s): "CHOL", "HDL", "LDLCALC", "TRIG", "CHOLHDL", "LDLDIRECT" in the last 72 hours. Thyroid Function Tests: No results for input(s): "TSH", "T4TOTAL", "FREET4", "T3FREE", "THYROIDAB" in the last 72 hours. Anemia Panel: No results for input(s): "VITAMINB12", "FOLATE", "FERRITIN", "TIBC", "IRON", "RETICCTPCT" in the last 72 hours. Sepsis Labs: No results for input(s): "PROCALCITON", "LATICACIDVEN" in the last 168 hours.  Recent Results (from the past 240 hour(s))  SARS Coronavirus 2 by RT PCR (hospital order, performed in [facility] hospital lab) *cepheid single result test* Anterior Nasal Swab     Status: None   Collection Time: 10/17/21  7:50 AM   Specimen: Anterior Nasal Swab  Result Value Ref Range Status   SARS Coronavirus 2 by RT PCR NEGATIVE NEGATIVE Final    Comment: (NOTE) SARS-CoV-2 target nucleic acids are NOT DETECTED.  The SARS-CoV-2 RNA is generally detectable in upper and lower respiratory specimens during the acute phase of infection. The lowest concentration of  SARS-CoV-2 viral copies this assay can detect is 250 copies / mL. A negative result does not preclude SARS-CoV-2 infection and should not be used as the sole basis for treatment or other patient management decisions.  A negative result may occur with improper specimen collection / handling, submission of specimen other than nasopharyngeal swab, presence of viral mutation(s) within the areas targeted by this assay, and inadequate number of viral copies (<250 copies / mL). A negative result must be combined with clinical observations, patient history, and epidemiological information.  Fact Sheet for Patients:   https://www.patel.info/  Fact Sheet for Healthcare Providers: https://hall.com/  This test is not yet approved or  cleared by the [place] FDA and has been authorized for detection and/or diagnosis of SARS-CoV-2 by FDA under an Emergency Use Authorization (EUA).  This EUA will remain in effect (meaning this test can be used) for the duration  of the COVID-19 declaration under Section 564(b)(1) of the Act, 21 U.S.C. section 360bbb-3(b)(1), unless the authorization is terminated or revoked sooner.  Performed at [facility], [place] [address], [place], [place] [address]          Radiology Studies: CT ABDOMEN PELVIS WO CONTRAST  Result Date: 10/20/2021 CLINICAL DATA:  Nausea and vomiting. EXAM: CT ABDOMEN AND PELVIS WITHOUT CONTRAST TECHNIQUE: Multidetector CT imaging of the abdomen and pelvis was performed following the standard protocol without IV contrast. RADIATION DOSE REDUCTION: This exam was performed according to the departmental dose-optimization program which includes automated exposure control, adjustment of the mA and/or kV according to patient size and/or use of iterative reconstruction technique. COMPARISON:  CT without contrast 10/02/2018. FINDINGS: Lower chest: The heart is slightly enlarged. There is no pericardial  effusion. Minimal posterior atelectasis is noted in the lower lobes without infiltrates. Hepatobiliary: Old cholecystectomy. No biliary dilatation. Liver is unremarkable without contrast. Pancreas: No focal abnormality without contrast. Spleen: Unremarkable without contrast.  No splenomegaly. Adrenals/Urinary Tract: There is no adrenal mass. Both kidneys are small in length, with the right measuring 8.3 cm length and the left 8.0 cm with at least mild interval volume loss. There is a 1.8 cm homogeneous posterior left renal cyst of 8 Hounsfield units requiring no further follow-up. This was slightly smaller previously. The unenhanced kidneys are otherwise unremarkable. There is no hydronephrosis. Contrast faintly opacifies the collecting systems and could obscure stones but there were no stones present 3 years ago. The wall and lumen of the bladder is unremarkable. Stomach/Bowel: There are diffuse thickened folds in the stomach., mild fold thickening in the duodenum. There is mild dilatation of the proximal jejunal segments up to 3.1 cm without visible transition Rest of unopacified small bowel unremarkable and normal caliber. The appendix surgically absent as before. No suspicious abnormality is seen of the large bowel wall. Vascular/Lymphatic: Aortic atherosclerosis. No enlarged abdominal or pelvic lymph nodes. Reproductive: Status post hysterectomy. No adnexal masses. Other: Numerous pelvic phleboliths. Small umbilical fat hernia. Trace presacral pelvic ascites without further ascites. There is no incarcerated hernia. There is no free air, free hemorrhage or abscess. There is subcutaneous stranding to the right and left in the mid abdominal wall most likely due to subcutaneous injections. Musculoskeletal: No acute or significant osseous findings. IMPRESSION: 1. Diffusely thickened stomach not seen previously, could indicate evidence of gastritis, peptic ulcer disease or infiltrating disease. There are no acute  inflammatory changes or free air. Correlate clinically and consider endoscopy if clinically warranted. 2. Mild dilatation of the proximal jejunal segments, normal caliber remaining small bowel. Probable ileus or enteritis etiology, less likely low-grade obstruction. No visible transitional segment. 3. Small kidneys, with contrast in the collecting systems and bladder. 4. Mild cardiomegaly. 5. Aortic atherosclerosis. Electronically Signed   By: Telford Nab M.D.   On: 10/20/2021 07:26   MR BRAIN WO CONTRAST  Result Date: 10/19/2021 CLINICAL DATA:  Headache, new or worsening (Age >= 50y) EXAM: MRI HEAD WITHOUT CONTRAST TECHNIQUE: Multiplanar, multiecho pulse sequences of the brain and surrounding structures were obtained without intravenous contrast. COMPARISON:  CT head May 2021. FINDINGS: Brain: No acute infarction, hemorrhage, hydrocephalus, extra-axial collection or mass lesion. Vascular: Major arterial flow voids are maintained at the skull base. Skull and upper cervical spine: Normal marrow signal. Sinuses/Orbits: Clear sinuses.  No acute orbital findings. Other: No sizable mastoid effusions. IMPRESSION: Normal brain MRI.  No acute abnormality. Electronically Signed   By: Jamesetta So.D.  On: 10/19/2021 19:05   DG Abd 1 View  Result Date: 10/19/2021 CLINICAL DATA:  Vomiting, dizziness, nausea. EXAM: ABDOMEN - 1 VIEW COMPARISON:  CT chest 10/18/2021, 09/06/2009 KUB FINDINGS: There is residual contrast in the urinary bladder following CT chest on 10/18/2021. There is mild prominence of small bowel loops, consistent with ileus or early small bowel obstruction. Colonic loops are normal in caliber. No evidence for organomegaly or free intraperitoneal air. Surgical clips are present in the RIGHT UPPER QUADRANT of the abdomen. IMPRESSION: Mildly prominent loops of small bowel consistent with ileus or early small bowel obstruction. Electronically Signed   By: Nolon Nations M.D.   On: 10/19/2021 14:32    ECHOCARDIOGRAM COMPLETE  Result Date: 10/19/2021    ECHOCARDIOGRAM REPORT   Patient Name:   MARGRETE DELUDE [place] Date of Exam: 10/19/2021 Medical Rec #:  [id number]     Height:       65.0 in Accession #:    [id number]    Weight:       208.1 lb Date of Birth:  1960/07/11     BSA:          2.012 m Patient Age:    79 years      BP:           183/68 mmHg Patient Gender: F             HR:           67 bpm. Exam Location:  Inpatient Procedure: 2D Echo Indications:    Chest Pain  History:        Patient has prior history of Echocardiogram examinations, most                 recent 08/07/2018. Abnormal ECG, Signs/Symptoms:Chest Pain and                 Shortness of Breath; Risk Factors:Dyslipidemia, Diabetes and                 Hypertension.  Sonographer:    Harvie Junior Referring Phys: [id number] Lily Kocher  Sonographer Comments: Image acquisition challenging due to patient body habitus. IMPRESSIONS  1. Left ventricular ejection fraction, by estimation, is 60 to 65%. The left ventricle has normal function. The left ventricle has no regional wall motion abnormalities. There is moderate concentric left ventricular hypertrophy. Left ventricular diastolic parameters are consistent with Grade I diastolic dysfunction (impaired relaxation). Elevated left atrial pressure.  2. Right ventricular systolic function is normal. The right ventricular size is normal.  3. Left atrial size was mildly dilated.  4. The mitral valve is normal in structure. Trivial mitral valve regurgitation. No evidence of mitral stenosis.  5. The aortic valve is tricuspid. Aortic valve regurgitation is trivial. Aortic valve sclerosis is present, with no evidence of aortic valve stenosis. FINDINGS  Left Ventricle: Left ventricular ejection fraction, by estimation, is 60 to 65%. The left ventricle has normal function. The left ventricle has no regional wall motion abnormalities. The left ventricular internal cavity size was normal in size. There is  moderate  concentric left ventricular hypertrophy. Left ventricular diastolic parameters are consistent with Grade I diastolic dysfunction (impaired relaxation). Elevated left atrial pressure. Right Ventricle: The right ventricular size is normal. Right ventricular systolic function is normal. Left Atrium: Left atrial size was mildly dilated. Right Atrium: Right atrial size was normal in size. Pericardium: There is no evidence of pericardial effusion. Mitral Valve: The mitral valve is normal in structure. Trivial mitral  valve regurgitation. No evidence of mitral valve stenosis. Tricuspid Valve: The tricuspid valve is normal in structure. Tricuspid valve regurgitation is trivial. No evidence of tricuspid stenosis. Aortic Valve: The aortic valve is tricuspid. Aortic valve regurgitation is trivial. Aortic valve sclerosis is present, with no evidence of aortic valve stenosis. Aortic valve mean gradient measures 6.0 mmHg. Aortic valve peak gradient measures 11.8 mmHg.  Aortic valve area, by VTI measures 1.91 cm. Pulmonic Valve: The pulmonic valve was normal in structure. Pulmonic valve regurgitation is not visualized. No evidence of pulmonic stenosis. Aorta: The aortic root is normal in size and structure. Venous: The inferior vena cava was not well visualized. IAS/Shunts: The interatrial septum was not well visualized.  LEFT VENTRICLE PLAX 2D LVIDd:         4.80 cm      Diastology LVIDs:         3.40 cm      LV e' medial:    3.37 cm/s LV PW:         1.20 cm      LV E/e' medial:  32.3 LV IVS:        1.20 cm      LV e' lateral:   4.46 cm/s LVOT diam:     1.80 cm      LV E/e' lateral: 24.4 LV SV:         64 LV SV Index:   32 LVOT Area:     2.54 cm  LV Volumes (MOD) LV vol d, MOD A2C: 88.2 ml LV vol d, MOD A4C: 108.0 ml LV vol s, MOD A2C: 38.1 ml LV vol s, MOD A4C: 48.1 ml LV SV MOD A2C:     50.1 ml LV SV MOD A4C:     108.0 ml LV SV MOD BP:      54.4 ml RIGHT VENTRICLE RV Basal diam:  3.40 cm RV Mid diam:    2.60 cm RV S prime:      14.40 cm/s TAPSE (M-mode): 3.0 cm LEFT ATRIUM             Index        RIGHT ATRIUM           Index LA diam:        3.40 cm 1.69 cm/m   RA Area:     10.80 cm LA Vol (A2C):   52.5 ml 26.10 ml/m  RA Volume:   21.50 ml  10.69 ml/m LA Vol (A4C):   68.6 ml 34.10 ml/m LA Biplane Vol: 61.7 ml 30.67 ml/m  AORTIC VALVE                     PULMONIC VALVE AV Area (Vmax):    1.79 cm      PV Vmax:       1.10 m/s AV Area (Vmean):   1.89 cm      PV Peak grad:  4.8 mmHg AV Area (VTI):     1.91 cm AV Vmax:           172.00 cm/s AV Vmean:          118.000 cm/s AV VTI:            0.335 m AV Peak Grad:      11.8 mmHg AV Mean Grad:      6.0 mmHg LVOT Vmax:         121.00 cm/s LVOT Vmean:        87.600 cm/s  LVOT VTI:          0.251 m LVOT/AV VTI ratio: 0.75  AORTA Ao Root diam: 2.80 cm Ao Asc diam:  3.00 cm MITRAL VALVE MV Area (PHT): 3.54 cm     SHUNTS MV Decel Time: 214 msec     Systemic VTI:  0.25 m MV E velocity: 109.00 cm/s  Systemic Diam: 1.80 cm MV A velocity: 119.00 cm/s MV E/A ratio:  0.92 Kirk Ruths MD Electronically signed by Kirk Ruths MD Signature Date/Time: 10/19/2021/12:08:30 PM    Final    CT Angio Chest Pulmonary Embolism (PE) W or WO Contrast  Result Date: 10/18/2021 CLINICAL DATA:  Shortness of breath and hypertension. EXAM: CT ANGIOGRAPHY CHEST WITH CONTRAST TECHNIQUE: Multidetector CT imaging of the chest was performed using the standard protocol during bolus administration of intravenous contrast. Multiplanar CT image reconstructions and MIPs were obtained to evaluate the vascular anatomy. RADIATION DOSE REDUCTION: This exam was performed according to the departmental dose-optimization program which includes automated exposure control, adjustment of the mA and/or kV according to patient size and/or use of iterative reconstruction technique. CONTRAST:  17mL OMNIPAQUE IOHEXOL 350 MG/ML SOLN COMPARISON:  CT scan from 2017 FINDINGS: Cardiovascular: The heart is borderline enlarged. There appears to be  a at least moderate left ventricular hypertrophy. The aorta is normal in caliber. No dissection or atherosclerotic calcification. The pulmonary arteries are well opacified. No filling defects to suggest pulmonary embolism. Mediastinum/Nodes: No mediastinal or hilar mass or lymphadenopathy. The esophagus is grossly normal. Lungs/Pleura: No acute pulmonary findings. No infiltrates, edema or effusions. No pulmonary lesions. Upper Abdomen: No significant upper abdominal findings. Musculoskeletal: No breast masses, supraclavicular or axillary adenopathy. The thyroid gland is unremarkable. The bony thorax is intact. Review of the MIP images confirms the above findings. IMPRESSION: 1. No CT findings for pulmonary embolism. 2. Normal thoracic aorta. 3. Borderline cardiomegaly with left ventricular hypertrophy. 4. No acute pulmonary findings. Electronically Signed   By: Marijo Sanes M.D.   On: 10/18/2021 21:11        Scheduled Meds:  amitriptyline  10 mg Oral QHS   calcitRIOL  1 mcg Oral Q M,W,F-HD   Chlorhexidine Gluconate Cloth  6 each Topical Q0600   Chlorhexidine Gluconate Cloth  6 each Topical Q0600   cloNIDine  0.2 mg Oral Q8H   furosemide  80 mg Oral BID   gabapentin  300 mg Oral BID   heparin  5,000 Units Subcutaneous Q8H   insulin aspart  0-6 Units Subcutaneous TID WC   isosorbide-hydrALAZINE  1 tablet Oral TID   ketorolac  1 drop Left Eye QID   labetalol  300 mg Oral BID   pantoprazole (PROTONIX) IV  40 mg Intravenous Q12H   polyethylene glycol  17 g Oral BID   prednisoLONE acetate  1 drop Left Eye TID   sodium chloride flush  3 mL Intravenous Q12H   Continuous Infusions:   LOS: 2 days    Time spent: 35 minutes     Chrisette Man A Tyresse Jayson, MD [facility]   If 7PM-7AM, please contact night-coverage [email or web address]  10/20/2021, 2:55 PM

## 2021-10-20 NOTE — Progress Notes (Signed)
Pt voided 244mL and passed a stool upon arrival to the unit  Bladder Scan done after and was unable to find any volume after multiple attempts

## 2021-10-20 NOTE — Progress Notes (Signed)
[facility] Progress Note   Subjective:    Seen patient on HD. Noted EDW recently raised. I was informed her pre standing weight was exactly at 94kg. Reviewed BP trend-improving. SBP in 120s now and no LE edema noted. CT chest (-) pulm edema/volume overload. Plan to run patient even today.  Objective Vitals:   10/20/21 0827 10/20/21 0830 10/20/21 0900 10/20/21 0930  BP: 119/62   121/85  Pulse: (!) 58     Resp: _0 Temp:      TempSrc:      SpO2: 100%     Weight:      Height:       Physical Exam General: Well-appearing; NAD Heart: S1 and S2; RRR; no murmurs, gallops, or rubs Lungs: CTA anteriorly Abdomen: Soft and non-tender Extremities: No LE edema Dialysis Access: L AVF (+) B/T   Filed Weights   10/18/21 1502 10/20/21 0537 10/20/21 0754  Weight: 94.4 kg 94 kg 94 kg    Intake/Output Summary (Last 24 hours) at 10/20/2021 0956 Last data filed at 10/19/2021 2000 Gross per 24 hour  Intake 120 ml  Output --  Net 120 ml    Additional Objective Labs: Basic Metabolic Panel: Recent Labs  Lab 10/17/21 0207 10/18/21 0921 10/20/21 0837  NA 142 137 135  K 3.5 3.9 3.8  CL 88* 91* 95*  CO2 41* 31 26  GLUCOSE 215* 170* 210*  BUN 25* 26* 38*  CREATININE 4.66* 4.60* 7.03*  CALCIUM 9.1 9.7 9.3  PHOS  --   --  5.6*   Liver Function Tests: Recent Labs  Lab 10/19/21 1327 10/20/21 0837  AST 13*  --   ALT 13  --   ALKPHOS 72  --   BILITOT 0.5  --   PROT 6.9  --   ALBUMIN 3.5 3.3*   Recent Labs  Lab 10/19/21 1327  LIPASE 39   CBC: Recent Labs  Lab 10/17/21 0207 10/18/21 0921 10/20/21 0837  WBC 10.5 12.5* 11.5*  HGB 10.0* 10.5* 9.6*  HCT 29.1* 29.4* 27.4*  MCV 86.9 83.5 85.4  PLT 214 191 228   Blood Culture    Component Value Date/Time   SDES URINE, RANDOM 07/02/2019 2348   SPECREQUEST NONE 07/02/2019 2348   CULT  07/02/2019 2348    NO GROWTH Performed at [facility], [place] [address] [place], [place] [address]     REPTSTATUS 07/03/2019 FINAL 07/02/2019 2348    Cardiac Enzymes: No results for input(s): "CKTOTAL", "CKMB", "CKMBINDEX", "TROPONINI" in the last 168 hours. CBG: Recent Labs  Lab 10/18/21 2124 10/19/21 0549 10/19/21 1201 10/20/21 0152 10/20/21 0559  GLUCAP 342* 181* 92 163* 209*   Iron Studies: No results for input(s): "IRON", "TIBC", "TRANSFERRIN", "FERRITIN" in the last 72 hours. Lab Results  Component Value Date   INR 1.0 10/17/2021   INR 1.09 08/13/2016   INR 0.99 10/22/2013   Studies/Results: CT ABDOMEN PELVIS WO CONTRAST  Result Date: 10/20/2021 CLINICAL DATA:  Nausea and vomiting. EXAM: CT ABDOMEN AND PELVIS WITHOUT CONTRAST TECHNIQUE: Multidetector CT imaging of the abdomen and pelvis was performed following the standard protocol without IV contrast. RADIATION DOSE REDUCTION: This exam was performed according to the departmental dose-optimization program which includes automated exposure control, adjustment of the mA and/or kV according to patient size and/or use of iterative reconstruction technique. COMPARISON:  CT without contrast 10/02/2018. FINDINGS: Lower chest: The heart is slightly enlarged. There is no pericardial effusion. Minimal posterior atelectasis is  noted in the lower lobes without infiltrates. Hepatobiliary: Old cholecystectomy. No biliary dilatation. Liver is unremarkable without contrast. Pancreas: No focal abnormality without contrast. Spleen: Unremarkable without contrast.  No splenomegaly. Adrenals/Urinary Tract: There is no adrenal mass. Both kidneys are small in length, with the right measuring 8.3 cm length and the left 8.0 cm with at least mild interval volume loss. There is a 1.8 cm homogeneous posterior left renal cyst of 8 Hounsfield units requiring no further follow-up. This was slightly smaller previously. The unenhanced kidneys are otherwise unremarkable. There is no hydronephrosis. Contrast faintly opacifies the collecting systems and could obscure  stones but there were no stones present 3 years ago. The wall and lumen of the bladder is unremarkable. Stomach/Bowel: There are diffuse thickened folds in the stomach., mild fold thickening in the duodenum. There is mild dilatation of the proximal jejunal segments up to 3.1 cm without visible transition Rest of unopacified small bowel unremarkable and normal caliber. The appendix surgically absent as before. No suspicious abnormality is seen of the large bowel wall. Vascular/Lymphatic: Aortic atherosclerosis. No enlarged abdominal or pelvic lymph nodes. Reproductive: Status post hysterectomy. No adnexal masses. Other: Numerous pelvic phleboliths. Small umbilical fat hernia. Trace presacral pelvic ascites without further ascites. There is no incarcerated hernia. There is no free air, free hemorrhage or abscess. There is subcutaneous stranding to the right and left in the mid abdominal wall most likely due to subcutaneous injections. Musculoskeletal: No acute or significant osseous findings. IMPRESSION: 1. Diffusely thickened stomach not seen previously, could indicate evidence of gastritis, peptic ulcer disease or infiltrating disease. There are no acute inflammatory changes or free air. Correlate clinically and consider endoscopy if clinically warranted. 2. Mild dilatation of the proximal jejunal segments, normal caliber remaining small bowel. Probable ileus or enteritis etiology, less likely low-grade obstruction. No visible transitional segment. 3. Small kidneys, with contrast in the collecting systems and bladder. 4. Mild cardiomegaly. 5. Aortic atherosclerosis. Electronically Signed   By: Telford Nab M.D.   On: 10/20/2021 07:26   MR BRAIN WO CONTRAST  Result Date: 10/19/2021 CLINICAL DATA:  Headache, new or worsening (Age >= 50y) EXAM: MRI HEAD WITHOUT CONTRAST TECHNIQUE: Multiplanar, multiecho pulse sequences of the brain and surrounding structures were obtained without intravenous contrast. COMPARISON:   CT head May 2021. FINDINGS: Brain: No acute infarction, hemorrhage, hydrocephalus, extra-axial collection or mass lesion. Vascular: Major arterial flow voids are maintained at the skull base. Skull and upper cervical spine: Normal marrow signal. Sinuses/Orbits: Clear sinuses.  No acute orbital findings. Other: No sizable mastoid effusions. IMPRESSION: Normal brain MRI.  No acute abnormality. Electronically Signed   By: Margaretha Sheffield M.D.   On: 10/19/2021 19:05   DG Abd 1 View  Result Date: 10/19/2021 CLINICAL DATA:  Vomiting, dizziness, nausea. EXAM: ABDOMEN - 1 VIEW COMPARISON:  CT chest 10/18/2021, 09/06/2009 KUB FINDINGS: There is residual contrast in the urinary bladder following CT chest on 10/18/2021. There is mild prominence of small bowel loops, consistent with ileus or early small bowel obstruction. Colonic loops are normal in caliber. No evidence for organomegaly or free intraperitoneal air. Surgical clips are present in the RIGHT UPPER QUADRANT of the abdomen. IMPRESSION: Mildly prominent loops of small bowel consistent with ileus or early small bowel obstruction. Electronically Signed   By: Nolon Nations M.D.   On: 10/19/2021 14:32   ECHOCARDIOGRAM COMPLETE  Result Date: 10/19/2021    ECHOCARDIOGRAM REPORT   Patient Name:   CEANNA WAREING Leandro Date of Exam: 10/19/2021  Medical Rec #:  [id number]     Height:       65.0 in Accession #:    [id number]    Weight:       208.1 lb Date of Birth:  1960/04/11     BSA:          2.012 m Patient Age:    38 years      BP:           183/68 mmHg Patient Gender: F             HR:           67 bpm. Exam Location:  Inpatient Procedure: 2D Echo Indications:    Chest Pain  History:        Patient has prior history of Echocardiogram examinations, most                 recent 08/07/2018. Abnormal ECG, Signs/Symptoms:Chest Pain and                 Shortness of Breath; Risk Factors:Dyslipidemia, Diabetes and                 Hypertension.  Sonographer:    Harvie Junior Referring  Phys: [id number] Lily Kocher  Sonographer Comments: Image acquisition challenging due to patient body habitus. IMPRESSIONS  1. Left ventricular ejection fraction, by estimation, is 60 to 65%. The left ventricle has normal function. The left ventricle has no regional wall motion abnormalities. There is moderate concentric left ventricular hypertrophy. Left ventricular diastolic parameters are consistent with Grade I diastolic dysfunction (impaired relaxation). Elevated left atrial pressure.  2. Right ventricular systolic function is normal. The right ventricular size is normal.  3. Left atrial size was mildly dilated.  4. The mitral valve is normal in structure. Trivial mitral valve regurgitation. No evidence of mitral stenosis.  5. The aortic valve is tricuspid. Aortic valve regurgitation is trivial. Aortic valve sclerosis is present, with no evidence of aortic valve stenosis. FINDINGS  Left Ventricle: Left ventricular ejection fraction, by estimation, is 60 to 65%. The left ventricle has normal function. The left ventricle has no regional wall motion abnormalities. The left ventricular internal cavity size was normal in size. There is  moderate concentric left ventricular hypertrophy. Left ventricular diastolic parameters are consistent with Grade I diastolic dysfunction (impaired relaxation). Elevated left atrial pressure. Right Ventricle: The right ventricular size is normal. Right ventricular systolic function is normal. Left Atrium: Left atrial size was mildly dilated. Right Atrium: Right atrial size was normal in size. Pericardium: There is no evidence of pericardial effusion. Mitral Valve: The mitral valve is normal in structure. Trivial mitral valve regurgitation. No evidence of mitral valve stenosis. Tricuspid Valve: The tricuspid valve is normal in structure. Tricuspid valve regurgitation is trivial. No evidence of tricuspid stenosis. Aortic Valve: The aortic valve is tricuspid. Aortic valve regurgitation  is trivial. Aortic valve sclerosis is present, with no evidence of aortic valve stenosis. Aortic valve mean gradient measures 6.0 mmHg. Aortic valve peak gradient measures 11.8 mmHg.  Aortic valve area, by VTI measures 1.91 cm. Pulmonic Valve: The pulmonic valve was normal in structure. Pulmonic valve regurgitation is not visualized. No evidence of pulmonic stenosis. Aorta: The aortic root is normal in size and structure. Venous: The inferior vena cava was not well visualized. IAS/Shunts: The interatrial septum was not well visualized.  LEFT VENTRICLE PLAX 2D LVIDd:         4.80 cm  Diastology LVIDs:         3.40 cm      LV e' medial:    3.37 cm/s LV PW:         1.20 cm      LV E/e' medial:  32.3 LV IVS:        1.20 cm      LV e' lateral:   4.46 cm/s LVOT diam:     1.80 cm      LV E/e' lateral: 24.4 LV SV:         64 LV SV Index:   32 LVOT Area:     2.54 cm  LV Volumes (MOD) LV vol d, MOD A2C: 88.2 ml LV vol d, MOD A4C: 108.0 ml LV vol s, MOD A2C: 38.1 ml LV vol s, MOD A4C: 48.1 ml LV SV MOD A2C:     50.1 ml LV SV MOD A4C:     108.0 ml LV SV MOD BP:      54.4 ml RIGHT VENTRICLE RV Basal diam:  3.40 cm RV Mid diam:    2.60 cm RV S prime:     14.40 cm/s TAPSE (M-mode): 3.0 cm LEFT ATRIUM             Index        RIGHT ATRIUM           Index LA diam:        3.40 cm 1.69 cm/m   RA Area:     10.80 cm LA Vol (A2C):   52.5 ml 26.10 ml/m  RA Volume:   21.50 ml  10.69 ml/m LA Vol (A4C):   68.6 ml 34.10 ml/m LA Biplane Vol: 61.7 ml 30.67 ml/m  AORTIC VALVE                     PULMONIC VALVE AV Area (Vmax):    1.79 cm      PV Vmax:       1.10 m/s AV Area (Vmean):   1.89 cm      PV Peak grad:  4.8 mmHg AV Area (VTI):     1.91 cm AV Vmax:           172.00 cm/s AV Vmean:          118.000 cm/s AV VTI:            0.335 m AV Peak Grad:      11.8 mmHg AV Mean Grad:      6.0 mmHg LVOT Vmax:         121.00 cm/s LVOT Vmean:        87.600 cm/s LVOT VTI:          0.251 m LVOT/AV VTI ratio: 0.75  AORTA Ao Root diam: 2.80 cm  Ao Asc diam:  3.00 cm MITRAL VALVE MV Area (PHT): 3.54 cm     SHUNTS MV Decel Time: 214 msec     Systemic VTI:  0.25 m MV E velocity: 109.00 cm/s  Systemic Diam: 1.80 cm MV A velocity: 119.00 cm/s MV E/A ratio:  0.92 Kirk Ruths MD Electronically signed by Kirk Ruths MD Signature Date/Time: 10/19/2021/12:08:30 PM    Final    CT Angio Chest Pulmonary Embolism (PE) W or WO Contrast  Result Date: 10/18/2021 CLINICAL DATA:  Shortness of breath and hypertension. EXAM: CT ANGIOGRAPHY CHEST WITH CONTRAST TECHNIQUE: Multidetector CT imaging of the chest was performed using the standard protocol during bolus administration of intravenous  contrast. Multiplanar CT image reconstructions and MIPs were obtained to evaluate the vascular anatomy. RADIATION DOSE REDUCTION: This exam was performed according to the departmental dose-optimization program which includes automated exposure control, adjustment of the mA and/or kV according to patient size and/or use of iterative reconstruction technique. CONTRAST:  62m OMNIPAQUE IOHEXOL 350 MG/ML SOLN COMPARISON:  CT scan from 2017 FINDINGS: Cardiovascular: The heart is borderline enlarged. There appears to be a at least moderate left ventricular hypertrophy. The aorta is normal in caliber. No dissection or atherosclerotic calcification. The pulmonary arteries are well opacified. No filling defects to suggest pulmonary embolism. Mediastinum/Nodes: No mediastinal or hilar mass or lymphadenopathy. The esophagus is grossly normal. Lungs/Pleura: No acute pulmonary findings. No infiltrates, edema or effusions. No pulmonary lesions. Upper Abdomen: No significant upper abdominal findings. Musculoskeletal: No breast masses, supraclavicular or axillary adenopathy. The thyroid gland is unremarkable. The bony thorax is intact. Review of the MIP images confirms the above findings. IMPRESSION: 1. No CT findings for pulmonary embolism. 2. Normal thoracic aorta. 3. Borderline cardiomegaly with  left ventricular hypertrophy. 4. No acute pulmonary findings. Electronically Signed   By: PMarijo SanesM.D.   On: 10/18/2021 21:11    Medications:   amitriptyline  10 mg Oral QHS   calcitRIOL  1 mcg Oral Q M,W,F-HD   Chlorhexidine Gluconate Cloth  6 each Topical Q0600   Chlorhexidine Gluconate Cloth  6 each Topical Q0600   cloNIDine  0.2 mg Oral Q8H   furosemide  80 mg Oral BID   gabapentin  300 mg Oral BID   heparin  5,000 Units Subcutaneous Q8H   insulin aspart  0-6 Units Subcutaneous TID WC   isosorbide-hydrALAZINE  1 tablet Oral TID   ketorolac  1 drop Left Eye QID   labetalol  300 mg Oral BID   pantoprazole (PROTONIX) IV  40 mg Intravenous Q12H   prednisoLONE acetate  1 drop Left Eye TID   sodium chloride flush  3 mL Intravenous Q12H    Dialysis Orders: MWF [facility] 4h  400/500  93.2kg  3K/2.5Ca bath  P2   Hep none  LUA AVF - last HD 9/4 post HD 95.4kg - high wt gains 3-7 kg - mircera 60 q2, last 9/4, due 9/18 - calcitriol 1.0 ug tiw po  Assessment/Plan: HTN urgency/pulm edema: Volume up here with HTN and pulm edema on initial CXR. Chronic large gains, worse over the weekend, but was previously reaching her dry weight (93.7) until it was lowered on 9/1-- hasn't met the 93.2kg yet. Dialyzed 9/5, 9/6 to try to off load volume, had cramping and CP both times. Cardiology consulted, EKG stable, Echo without WMA per notes. Plan is for outpatient stress test, but to work on controlling BP for now. CT angio last night without pulm edema or PE. Hydralazine changed to BiDil 9/6 and Bps are now improving. EDW recently raised to 94kg. ESRD: On HD. Performed pre-HD standing weight and she came in exactly on 94kg. CT chest (-) pulm edema and volume overload and no LE edema/SOB noted today-will run even. 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 ESA not due yet. Secondary HPTH: Ca ok, Phos pending. Continue home meds. T2DM: Insulin per primary. She is also on a pretty hefty dose of gabapentin, would rec  reducing dose given ESRD. Chest pain: Likely demand ischemia. Last stress test normal in 2020. EKG/Echo ok here. Cardiology consulted, plan is for outpatient stress test. Vomiting: Unclear etiology, ?HTN v. med effect v. other etiology. WBC up slightly.  She is afebrile, no severe abd pain. S/p IV zofran early this am, and getting another dose for her now. Per primary.  Tobie Poet, NP [facility] 10/20/2021,9:56 AM  LOS: 2 days

## 2021-10-20 NOTE — Consult Note (Addendum)
Consultation  Referring Provider:   [facility] Primary Care Physician:  [name], MD Primary Gastroenterologist:  Dr. Loletha Carrow       Reason for Consultation:     Nausea, vomiting gastritis seen on CT with possible ileus   Attending physician's note  I have taken a history, reviewed the chart and examined the patient. I performed a substantive portion of this encounter, including complete performance of at least one of the key components, in conjunction with the APP. I agree with the APP's note, impression and recommendations.    61 year old very pleasant female with history of diabetes, end-stage renal disease on dialysis, chronic hep C s/p SVR admitted with hypertensive emergency and chest pain which has since resolved  She no longer has nausea or vomiting.  Denies any abdominal pain Patient wants to eat, will advance diet If she is tolerating diet, will hold off repeat EGD  Nausea and vomiting likely exacerbated in the setting of hypertensive crisis Continue supportive care Avoid narcotics Add MiraLAX 1 capful twice daily to prevent constipation  GI will continue to follow along  The patient was provided an opportunity to ask questions and all were answered. The patient agreed with the plan and demonstrated an understanding of the instructions.  Damaris Hippo , MD [phone]      Impression    Nausea, vomiting with gastritis seen on CT  Patient with history of gastritis H. pylori and gastric ulcer secondary to NSAID use 06/14/2020 endoscopy Dr. Loletha Carrow normal esophagus and stomach CT abdomen pelvis without contrast shows diffusely thickened stomach could evident gastritis, peptic ulcer disease or infiltrating disease.  Possible small bowel obstruction versus ileus Mild dilatation of proximal jejunal segments, normal palpable remaining small bowel, possible ileus versus enteritis most likely low-grade obstruction no visible transitional segment. Patient has had  multiple bowel surgeries but no clear transitional segment. Stable ileus from dialysis/electrolyte imbalances  Chronic anemia secondary to CKD INR 10 on admission, which is around baseline Today at 9.6, normal MCV  Hypertensive crisis/urgency: Improved with hemodialysis and medication adjustments   Chest pain;  Mild elevation troponin 27, stress test 2020 low risk Echocardiogram with normal ejection fraction Presumed to be from hypertensive crisis, cardiology signed off. CTA negative for PE Question noncardiac etiology possible from esophagitis, esophageal spasm  ESRD on hemodialysis Had extra hemodialysis in 9/5, HD 9/06     Plan   -Continue clear liquid diet as tolerated, if patient starts to have worsening nausea and vomiting, consider NG tube placement -On fluid resuscitation at this time due to CHF, dialysis-monitor closely -Maintain magnesium above 2 and potassium at 4-4.5.  -Miralax BID -Early mobilization, avoid narcotics-she has been getting tramadol -Continue supportive care -BMP and Magnesium level in am -Protonix 40 mg IV BID --Continue to monitor H&H with transfusion as needed to maintain hemoglobin greater than 7. -Patient on heparin 5000 units every 8 hours, would need to be stopped at least 4 hours prior to endoscopic evaluation. -Pending bowel habits, can consider endoscopy tomorrow.  We will make n.p.o. tomorrow morning.  Bowel habits are not improving may reschedule to another day as to not worsen ileus/SBO.  Final decision on timing of endoscopy per Dr. Silverio Decamp  Thank you for your kind consultation, we will continue to follow.         HPI:   [name] [name] is a 61 y.o. female with past medical history significant for diabetes, end stage renal disease on dialysis, anemia, reflux,  upper GI bleed 2018 NSAID induced gastric ulcer, history of H. pylori gastritis treated 2018, history of esophageal spasm, chronic hepatitis C treated with Harvoni 2018 with [facility] presented to the ER with chest discomfort then began to have nausea and vomiting dialysis. Multiple abdominal surgeries including cholecystectomy, appendectomy, total abdominal hysterectomy/PCO.  03/14/2021 office visit with Carl Best, NP for dark stools recurrent anemia. Hemoccult cards that office visit negative 06/14/2020 endoscopy Dr. Loletha Carrow normal esophagus and stomach 06/14/2020 colonoscopy unremarkable  06/2013 EGD.  Chest pain secondary to esoph spasm.  Grossly normal EGD.  S/p balloon dilation and Botox injection.  10/2013 EGD Grossly normal.  Underwent 61mm balloon dilation and Botox injection 12/2012 EGD. Early esoph stricture.  Gastric erosions.  Balloon dilation and Botox injection to GE Jx.  12/2009 esophageal manometry. Normal.   01/2013 Colonoscopy: normal avg risk screening study: Normal 05/2011 EGD.  H Pylori + gastritis. (treated).    Patient presented to the ER 10/17/2021, with chest pain and dyspnea on exertion. Cardiology saw the patient, troponin negative.  EKG unremarkable. Stress test 08/02/2018 low risk Chest x-ray in the ER mild cardiomegaly mild vascular congestion Echo normal EF, increased antihypertensive regiment Admitting hemoglobin 10, currently 9.6.  Patient was getting dialysis for fluid overload. Patient states she started have nausea and vomiting, complaining of not having a bowel movement or flatulence for 2 days.  Prior to that was having bowel movement daily.  Denies any melena or hematochezia.  Patient was not on PPI outpatient.  Past week has been having some reflux, hoarseness since Sunday.  Denies NSAIDs, alcohol, new medications Has had some intermittent dysphagia for the past 2 weeks with solids/pills, no issues with liquids.  No odynophagia. Denies fever, chills, weight loss. Patient's not had any nausea and vomiting since yesterday but has been n.p.o. today. Had episode of substernal chest pain yesterday, does have history of  esophageal spasm.  Denies any recent antibiotics. KUB showed concern for ileus.  So CT was done which showed diffuse stomach thickening, possible gastritis, PUD or infiltrating disease.  Also showed probable ileus or enteritis less likely low-grade obstruction.  No transitional segment.  CT AB and pelvis IMPRESSION: 1. Diffusely thickened stomach not seen previously, could indicate evidence of gastritis, peptic ulcer disease or infiltrating disease. There are no acute inflammatory changes or free air. Correlate clinically and consider endoscopy if clinically warranted. 2. Mild dilatation of the proximal jejunal segments, normal caliber remaining small bowel. Probable ileus or enteritis etiology, less likely low-grade obstruction. No visible transitional segment. 3. Small kidneys, with contrast in the collecting systems and bladder. 4. Mild cardiomegaly. 5. Aortic atherosclerosis.  Past Medical History:  Diagnosis Date   Anemia    Anxiety    Arthritis    "hips" (08/28/2013)   Asthma    Chronic kidney disease    Chronic lower back pain    10-27-13 not a problem at this time   Depression    Esophageal spasm    Frequent UTI    GERD (gastroesophageal reflux disease)    Hepatitis C    genotype 1B   High cholesterol    Hypertension    Migraine    "monthly" (08/28/2013)   Pneumonia    "several times"   Type II diabetes mellitus ([place])    Ulcer    hx gastric ulcers    Surgical History:  She  has a past surgical history that includes Cholecystectomy; Abdominal hysterectomy; Appendectomy; Excision heel spur excision (Right); Tonsillectomy;  Esophagogastroduodenoscopy (N/A, 12/29/2012); Botox injection (12/29/2012); Balloon dilation (N/A, 12/29/2012); Esophagogastroduodenoscopy (N/A, 06/25/2013); Balloon dilation (N/A, 06/25/2013); Botox injection (N/A, 06/25/2013); Carpal tunnel release (Right); Tubal ligation; Foot fracture surgery (Right); Cardiac catheterization (X 2); Colonoscopy  (04/10/2012); Esophagogastroduodenoscopy (N/A, 11/03/2013); Esophageal manometry (N/A, 12/26/2015); Esophagogastroduodenoscopy (N/A, 08/14/2016); AV fistula placement (Left, 05/19/2019); AV fistula placement (Left, 0/11/9321); and Bascilic vein transposition (Left, 09/24/2019). Family History:  Her family history includes Alzheimer's disease in her father; Diabetes in her brother; Hypertension in her mother; Throat cancer (age of onset: 16) in her sister. Social History:   reports that she has never smoked. She has never used smokeless tobacco. She reports that she does not drink alcohol and does not use drugs.  Prior to Admission medications   Medication Sig Start Date End Date Taking? Authorizing Provider  acetaminophen (TYLENOL) 500 MG tablet Take 1,000 mg by mouth 2 (two) times daily.   Yes [provider]  albuterol (VENTOLIN HFA) 108 (90 Base) MCG/ACT inhaler Inhale 2 puffs into the lungs every 4 (four) hours as needed for wheezing or shortness of breath.    Yes [provider]  ALPRAZolam (XANAX) 0.25 MG tablet Take 0.25 mg by mouth 3 (three) times daily as needed for anxiety. 04/07/21  Yes [provider]  amitriptyline (ELAVIL) 10 MG tablet Take 10 mg by mouth at bedtime. 10/05/21  Yes [provider]  Cinnamon 500 MG TABS Take 500 mg by mouth daily.   Yes [provider]  cloNIDine (CATAPRES) 0.1 MG tablet Take 0.1-0.2 mg by mouth 2 (two) times daily. 06/15/21  Yes [provider]  cyclobenzaprine (FLEXERIL) 10 MG tablet Take 10 mg by mouth 2 (two) times daily as needed for muscle spasms. 03/21/21  Yes [provider]  furosemide (LASIX) 80 MG tablet Take 80 mg by mouth 2 (two) times daily.  07/17/19  Yes [provider]  gabapentin (NEURONTIN) 300 MG capsule Take 2 AM, 1 PM, 2 QHS Patient taking differently: Take 300-600 mg by mouth See admin instructions. Taking 2 caps ( 600mg ) in the AM and 300 mg @ 1:00 pm and 2 caps ( 600 mg)  at night 10/12/21  Yes Hyatt, Max T, DPM  HUMULIN N 100 UNIT/ML injection Inject 15 Units into the skin 2 (two) times daily. 06/26/18  Yes [provider]  hydrALAZINE (APRESOLINE) 25 MG tablet Take 25 mg by mouth 2 (two) times daily. 05/11/21  Yes [provider]  ketorolac (ACULAR) 0.5 % ophthalmic solution Place 1 drop into the left eye in the morning, at noon, and at bedtime. 05/09/21  Yes [provider]  labetalol (NORMODYNE) 300 MG tablet Take 300 mg by mouth 2 (two) times daily. 05/04/21  Yes [provider]  lidocaine (LIDODERM) 5 % Place 1 patch onto the skin daily. 10/11/21  Yes [provider]  prednisoLONE acetate (PRED FORTE) 1 % ophthalmic suspension Place 1 drop into the left eye 3 (three) times daily. 09/14/21  Yes [provider]  BELBUCA 75 MCG FILM SMARTSIG:1 Strip(s) By Mouth Every 12 Hours Patient not taking: Reported on 10/17/2021 05/03/21   [provider]    Current Facility-Administered Medications  Medication Dose Route Frequency Provider Last Rate Last Admin   acetaminophen (TYLENOL) tablet 650 mg  650 mg Oral Q6H PRN Karmen Bongo, MD   650 mg at 10/18/21 2123   Or   acetaminophen (TYLENOL) suppository 650 mg  650 mg Rectal Q6H PRN Karmen Bongo, MD  albuterol (PROVENTIL) (2.5 MG/3ML) 0.083% nebulizer solution 3 mL  3 mL Inhalation Q4H PRN Karmen Bongo, MD       amitriptyline (ELAVIL) tablet 10 mg  10 mg Oral Ivery Quale, MD   10 mg at 10/20/21 0113   calcitRIOL (ROCALTROL) capsule 1 mcg  1 mcg Oral Q M,W,F-HD Roney Jaffe, MD   1 mcg at 10/18/21 1432   calcium carbonate (dosed in mg elemental calcium) suspension 500 mg of elemental calcium  500 mg of elemental calcium Oral Q6H PRN Karmen Bongo, MD       camphor-menthol [facility]) lotion 1 Application  1 Application Topical Y8F PRN Karmen Bongo, MD       And   hydrOXYzine (ATARAX) tablet 25 mg  25 mg Oral Q8H PRN Karmen Bongo, MD        Chlorhexidine Gluconate Cloth 2 % PADS 6 each  6 each Topical Q0600 Roney Jaffe, MD   6 each at 10/17/21 1859   Chlorhexidine Gluconate Cloth 2 % PADS 6 each  6 each Topical Q0600 Roney Jaffe, MD   6 each at 10/18/21 0700   cloNIDine (CATAPRES) tablet 0.2 mg  0.2 mg Oral Q8H Regalado, Belkys A, MD   0.2 mg at 10/20/21 0277   cyclobenzaprine (FLEXERIL) tablet 10 mg  10 mg Oral BID PRN Karmen Bongo, MD       docusate sodium (ENEMEEZ) enema 283 mg  1 enema Rectal PRN Karmen Bongo, MD       feeding supplement (NEPRO CARB STEADY) liquid 237 mL  237 mL Oral TID PRN Karmen Bongo, MD       furosemide (LASIX) tablet 80 mg  80 mg Oral BID Karmen Bongo, MD   80 mg at 10/19/21 1948   gabapentin (NEURONTIN) capsule 300 mg  300 mg Oral BID Regalado, Belkys A, MD       heparin injection 5,000 Units  5,000 Units Subcutaneous Lynne Logan, MD   5,000 Units at 10/20/21 4128   hydrALAZINE (APRESOLINE) injection 5 mg  5 mg Intravenous Q4H PRN Karmen Bongo, MD   5 mg at 10/19/21 0438   insulin aspart (novoLOG) injection 0-6 Units  0-6 Units Subcutaneous TID [facility] Karmen Bongo, MD   2 Units at 10/20/21 7867   isosorbide-hydrALAZINE (BIDIL) 20-37.5 MG per tablet 1 tablet  1 tablet Oral TID Lily Kocher, PA-C   1 tablet at 10/20/21 0113   ketorolac (ACULAR) 0.5 % ophthalmic solution 1 drop  1 drop Left Eye QID Karmen Bongo, MD   1 drop at 10/20/21 0113   labetalol (NORMODYNE) tablet 300 mg  300 mg Oral BID Karmen Bongo, MD   300 mg at 10/20/21 0113   ondansetron (ZOFRAN) tablet 4 mg  4 mg Oral Q6H PRN Karmen Bongo, MD       Or   ondansetron [facility]) injection 4 mg  4 mg Intravenous Q6H PRN Karmen Bongo, MD   4 mg at 10/19/21 1054   pantoprazole (PROTONIX) injection 40 mg  40 mg Intravenous Q12H Regalado, Belkys A, MD   40 mg at 10/19/21 1948   prednisoLONE acetate (PRED FORTE) 1 % ophthalmic suspension 1 drop  1 drop Left Eye TID Karmen Bongo, MD   1 drop at 10/20/21 0114    sodium chloride flush (NS) 0.9 % injection 3 mL  3 mL Intravenous Q12H Karmen Bongo, MD   3 mL at 10/19/21 2200   sorbitol 70 % solution 30 mL  30 mL Oral PRN Karmen Bongo,  MD       traMADol (ULTRAM) tablet 50 mg  50 mg Oral Q12H PRN Regalado, Belkys A, MD   50 mg at 10/19/21 0554   zolpidem (AMBIEN) tablet 5 mg  5 mg Oral QHS PRN Karmen Bongo, MD   5 mg at 10/17/21 2240   Facility-Administered Medications Ordered in Other Encounters  Medication Dose Route Frequency Provider Last Rate Last Admin   0.9 %  sodium chloride infusion   Intravenous Continuous PRN Neldon Newport, CRNA   New Bag at 07/02/19 1304    Allergies as of 10/17/2021 - Review Complete 10/17/2021  Allergen Reaction Noted   Hydrochlorothiazide Itching    Amlodipine  05/12/2019   Duloxetine  05/12/2019   Lisinopril Cough 06/06/2018   Metformin     Codeine Itching 03/10/2008   Dilaudid [hydromorphone hcl] Itching 08/28/2013    Review of Systems:    Constitutional: No weight loss, fever, chills, weakness or fatigue HEENT: Eyes: No change in vision               Ears, Nose, Throat:  No change in hearing or congestion Skin: No rash or itching Cardiovascular: No chest pain, chest pressure or palpitations   Respiratory: No SOB or cough Gastrointestinal: See HPI and otherwise negative Genitourinary: No dysuria or change in urinary frequency Neurological: No headache, dizziness or syncope Musculoskeletal: No new muscle or joint pain Hematologic: No bleeding or bruising Psychiatric: No history of depression or anxiety     Physical Exam:  Vital signs in last 24 hours: Temp:  [97.8 F (36.6 C)-98.8 F (37.1 C)] 97.8 F (36.6 C) (09/08 0754) Pulse Rate:  [58-74] 58 (09/08 1030) Resp:  [11-20] 15 (09/08 1030) BP: (105-143)/(47-85) 122/71 (09/08 1030) SpO2:  [94 %-100 %] 100 % (09/08 0827) Weight:  [94 kg] 94 kg (09/08 0754) Last BM Date : 10/18/21 Last BM recorded by nurses in past 5 days No data  recorded  General:   Pleasant, well developed female in no acute distress Head:  Normocephalic and atraumatic. Eyes: sclerae anicteric,conjunctive pink  Heart:  regular rate and rhythm, systolic murmur Pulm: Clear anteriorly; no wheezing Abdomen:  Soft, Obese AB, Hypoactive bowel sounds. moderate tenderness in the RLQ. Without guarding and Without rebound, No organomegaly appreciated. Extremities: No edema, left upper extremity fistula Msk:  Symmetrical without gross deformities. Peripheral pulses intact.  Neurologic:  Alert and  oriented x4;  No focal deficits.  Skin:   Dry and intact without significant lesions or rashes. Psychiatric:  Cooperative. Normal mood and affect.  LAB RESULTS: Recent Labs    10/18/21 0921 10/20/21 0837  WBC 12.5* 11.5*  HGB 10.5* 9.6*  HCT 29.4* 27.4*  PLT 191 228   BMET Recent Labs    10/18/21 0921 10/20/21 0837  NA 137 135  K 3.9 3.8  CL 91* 95*  CO2 31 26  GLUCOSE 170* 210*  BUN 26* 38*  CREATININE 4.60* 7.03*  CALCIUM 9.7 9.3   LFT Recent Labs    10/19/21 1327 10/20/21 0837  PROT 6.9  --   ALBUMIN 3.5 3.3*  AST 13*  --   ALT 13  --   ALKPHOS 72  --   BILITOT 0.5  --   BILIDIR <0.1  --   IBILI NOT CALCULATED  --    PT/INR No results for input(s): "LABPROT", "INR" in the last 72 hours.  STUDIES: CT ABDOMEN PELVIS WO CONTRAST  Result Date: 10/20/2021 CLINICAL DATA:  Nausea and vomiting. EXAM:  CT ABDOMEN AND PELVIS WITHOUT CONTRAST TECHNIQUE: Multidetector CT imaging of the abdomen and pelvis was performed following the standard protocol without IV contrast. RADIATION DOSE REDUCTION: This exam was performed according to the departmental dose-optimization program which includes automated exposure control, adjustment of the mA and/or kV according to patient size and/or use of iterative reconstruction technique. COMPARISON:  CT without contrast 10/02/2018. FINDINGS: Lower chest: The heart is slightly enlarged. There is no pericardial  effusion. Minimal posterior atelectasis is noted in the lower lobes without infiltrates. Hepatobiliary: Old cholecystectomy. No biliary dilatation. Liver is unremarkable without contrast. Pancreas: No focal abnormality without contrast. Spleen: Unremarkable without contrast.  No splenomegaly. Adrenals/Urinary Tract: There is no adrenal mass. Both kidneys are small in length, with the right measuring 8.3 cm length and the left 8.0 cm with at least mild interval volume loss. There is a 1.8 cm homogeneous posterior left renal cyst of 8 Hounsfield units requiring no further follow-up. This was slightly smaller previously. The unenhanced kidneys are otherwise unremarkable. There is no hydronephrosis. Contrast faintly opacifies the collecting systems and could obscure stones but there were no stones present 3 years ago. The wall and lumen of the bladder is unremarkable. Stomach/Bowel: There are diffuse thickened folds in the stomach., mild fold thickening in the duodenum. There is mild dilatation of the proximal jejunal segments up to 3.1 cm without visible transition Rest of unopacified small bowel unremarkable and normal caliber. The appendix surgically absent as before. No suspicious abnormality is seen of the large bowel wall. Vascular/Lymphatic: Aortic atherosclerosis. No enlarged abdominal or pelvic lymph nodes. Reproductive: Status post hysterectomy. No adnexal masses. Other: Numerous pelvic phleboliths. Small umbilical fat hernia. Trace presacral pelvic ascites without further ascites. There is no incarcerated hernia. There is no free air, free hemorrhage or abscess. There is subcutaneous stranding to the right and left in the mid abdominal wall most likely due to subcutaneous injections. Musculoskeletal: No acute or significant osseous findings. IMPRESSION: 1. Diffusely thickened stomach not seen previously, could indicate evidence of gastritis, peptic ulcer disease or infiltrating disease. There are no acute  inflammatory changes or free air. Correlate clinically and consider endoscopy if clinically warranted. 2. Mild dilatation of the proximal jejunal segments, normal caliber remaining small bowel. Probable ileus or enteritis etiology, less likely low-grade obstruction. No visible transitional segment. 3. Small kidneys, with contrast in the collecting systems and bladder. 4. Mild cardiomegaly. 5. Aortic atherosclerosis. Electronically Signed   By: Telford Nab M.D.   On: 10/20/2021 07:26   MR BRAIN WO CONTRAST  Result Date: 10/19/2021 CLINICAL DATA:  Headache, new or worsening (Age >= 50y) EXAM: MRI HEAD WITHOUT CONTRAST TECHNIQUE: Multiplanar, multiecho pulse sequences of the brain and surrounding structures were obtained without intravenous contrast. COMPARISON:  CT head May 2021. FINDINGS: Brain: No acute infarction, hemorrhage, hydrocephalus, extra-axial collection or mass lesion. Vascular: Major arterial flow voids are maintained at the skull base. Skull and upper cervical spine: Normal marrow signal. Sinuses/Orbits: Clear sinuses.  No acute orbital findings. Other: No sizable mastoid effusions. IMPRESSION: Normal brain MRI.  No acute abnormality. Electronically Signed   By: Margaretha Sheffield M.D.   On: 10/19/2021 19:05   DG Abd 1 View  Result Date: 10/19/2021 CLINICAL DATA:  Vomiting, dizziness, nausea. EXAM: ABDOMEN - 1 VIEW COMPARISON:  CT chest 10/18/2021, 09/06/2009 KUB FINDINGS: There is residual contrast in the urinary bladder following CT chest on 10/18/2021. There is mild prominence of small bowel loops, consistent with ileus or early small bowel obstruction.  Colonic loops are normal in caliber. No evidence for organomegaly or free intraperitoneal air. Surgical clips are present in the RIGHT UPPER QUADRANT of the abdomen. IMPRESSION: Mildly prominent loops of small bowel consistent with ileus or early small bowel obstruction. Electronically Signed   By: Nolon Nations M.D.   On: 10/19/2021 14:32    ECHOCARDIOGRAM COMPLETE  Result Date: 10/19/2021    ECHOCARDIOGRAM REPORT   Patient Name:   [name] [name] Clabo Date of Exam: 10/19/2021 Medical Rec #:  [id number]     Height:       65.0 in Accession #:    [id number]    Weight:       208.1 lb Date of Birth:  30-Oct-1960     BSA:          2.012 m Patient Age:    22 years      BP:           183/68 mmHg Patient Gender: F             HR:           67 bpm. Exam Location:  Inpatient Procedure: 2D Echo Indications:    Chest Pain  History:        Patient has prior history of Echocardiogram examinations, most                 recent 08/07/2018. Abnormal ECG, Signs/Symptoms:Chest Pain and                 Shortness of Breath; Risk Factors:Dyslipidemia, Diabetes and                 Hypertension.  Sonographer:    Harvie Junior Referring Phys: [id number] Lily Kocher  Sonographer Comments: Image acquisition challenging due to patient body habitus. IMPRESSIONS  1. Left ventricular ejection fraction, by estimation, is 60 to 65%. The left ventricle has normal function. The left ventricle has no regional wall motion abnormalities. There is moderate concentric left ventricular hypertrophy. Left ventricular diastolic parameters are consistent with Grade I diastolic dysfunction (impaired relaxation). Elevated left atrial pressure.  2. Right ventricular systolic function is normal. The right ventricular size is normal.  3. Left atrial size was mildly dilated.  4. The mitral valve is normal in structure. Trivial mitral valve regurgitation. No evidence of mitral stenosis.  5. The aortic valve is tricuspid. Aortic valve regurgitation is trivial. Aortic valve sclerosis is present, with no evidence of aortic valve stenosis. FINDINGS  Left Ventricle: Left ventricular ejection fraction, by estimation, is 60 to 65%. The left ventricle has normal function. The left ventricle has no regional wall motion abnormalities. The left ventricular internal cavity size was normal in size. There is  moderate  concentric left ventricular hypertrophy. Left ventricular diastolic parameters are consistent with Grade I diastolic dysfunction (impaired relaxation). Elevated left atrial pressure. Right Ventricle: The right ventricular size is normal. Right ventricular systolic function is normal. Left [facility]: Left atrial size was mildly dilated. Right [facility]: Right atrial size was normal in size. Pericardium: There is no evidence of pericardial effusion. Mitral Valve: The mitral valve is normal in structure. Trivial mitral valve regurgitation. No evidence of mitral valve stenosis. Tricuspid Valve: The tricuspid valve is normal in structure. Tricuspid valve regurgitation is trivial. No evidence of tricuspid stenosis. Aortic Valve: The aortic valve is tricuspid. Aortic valve regurgitation is trivial. Aortic valve sclerosis is present, with no evidence of aortic valve stenosis. Aortic valve mean gradient measures 6.0 mmHg. Aortic valve peak  gradient measures 11.8 mmHg.  Aortic valve area, by VTI measures 1.91 cm. Pulmonic Valve: The pulmonic valve was normal in structure. Pulmonic valve regurgitation is not visualized. No evidence of pulmonic stenosis. Aorta: The aortic root is normal in size and structure. Venous: The inferior vena cava was not well visualized. IAS/Shunts: The interatrial septum was not well visualized.  LEFT VENTRICLE PLAX 2D LVIDd:         4.80 cm      Diastology LVIDs:         3.40 cm      LV e' medial:    3.37 cm/s LV PW:         1.20 cm      LV E/e' medial:  32.3 LV IVS:        1.20 cm      LV e' lateral:   4.46 cm/s LVOT diam:     1.80 cm      LV E/e' lateral: 24.4 LV SV:         64 LV SV Index:   32 LVOT Area:     2.54 cm  LV Volumes (MOD) LV vol d, MOD A2C: 88.2 ml LV vol d, MOD A4C: 108.0 ml LV vol s, MOD A2C: 38.1 ml LV vol s, MOD A4C: 48.1 ml LV SV MOD A2C:     50.1 ml LV SV MOD A4C:     108.0 ml LV SV MOD BP:      54.4 ml RIGHT VENTRICLE RV Basal diam:  3.40 cm RV Mid diam:    2.60 cm RV S prime:      14.40 cm/s TAPSE (M-mode): 3.0 cm LEFT ATRIUM             Index        RIGHT ATRIUM           Index LA diam:        3.40 cm 1.69 cm/m   RA Area:     10.80 cm LA Vol (A2C):   52.5 ml 26.10 ml/m  RA Volume:   21.50 ml  10.69 ml/m LA Vol (A4C):   68.6 ml 34.10 ml/m LA Biplane Vol: 61.7 ml 30.67 ml/m  AORTIC VALVE                     PULMONIC VALVE AV Area (Vmax):    1.79 cm      PV Vmax:       1.10 m/s AV Area (Vmean):   1.89 cm      PV Peak grad:  4.8 mmHg AV Area (VTI):     1.91 cm AV Vmax:           172.00 cm/s AV Vmean:          118.000 cm/s AV VTI:            0.335 m AV Peak Grad:      11.8 mmHg AV Mean Grad:      6.0 mmHg LVOT Vmax:         121.00 cm/s LVOT Vmean:        87.600 cm/s LVOT VTI:          0.251 m LVOT/AV VTI ratio: 0.75  AORTA Ao Root diam: 2.80 cm Ao Asc diam:  3.00 cm MITRAL VALVE MV Area (PHT): 3.54 cm     SHUNTS MV Decel Time: 214 msec     Systemic VTI:  0.25 m MV E velocity: 109.00 cm/s  Systemic Diam: 1.80 cm MV A velocity: 119.00 cm/s MV E/A ratio:  0.92 Kirk Ruths MD Electronically signed by Kirk Ruths MD Signature Date/Time: 10/19/2021/12:08:30 PM    Final    CT Angio Chest Pulmonary Embolism (PE) W or WO Contrast  Result Date: 10/18/2021 CLINICAL DATA:  Shortness of breath and hypertension. EXAM: CT ANGIOGRAPHY CHEST WITH CONTRAST TECHNIQUE: Multidetector CT imaging of the chest was performed using the standard protocol during bolus administration of intravenous contrast. Multiplanar CT image reconstructions and MIPs were obtained to evaluate the vascular anatomy. RADIATION DOSE REDUCTION: This exam was performed according to the departmental dose-optimization program which includes automated exposure control, adjustment of the mA and/or kV according to patient size and/or use of iterative reconstruction technique. CONTRAST:  17mL OMNIPAQUE IOHEXOL 350 MG/ML SOLN COMPARISON:  CT scan from 2017 FINDINGS: Cardiovascular: The heart is borderline enlarged. There appears to be  a at least moderate left ventricular hypertrophy. The aorta is normal in caliber. No dissection or atherosclerotic calcification. The pulmonary arteries are well opacified. No filling defects to suggest pulmonary embolism. Mediastinum/Nodes: No mediastinal or hilar mass or lymphadenopathy. The esophagus is grossly normal. Lungs/Pleura: No acute pulmonary findings. No infiltrates, edema or effusions. No pulmonary lesions. Upper Abdomen: No significant upper abdominal findings. Musculoskeletal: No breast masses, supraclavicular or axillary adenopathy. The thyroid gland is unremarkable. The bony thorax is intact. Review of the MIP images confirms the above findings. IMPRESSION: 1. No CT findings for pulmonary embolism. 2. Normal thoracic aorta. 3. Borderline cardiomegaly with left ventricular hypertrophy. 4. No acute pulmonary findings. Electronically Signed   By: Marijo Sanes M.D.   On: 10/18/2021 21:11     Vladimir Crofts  10/20/2021, 11:48 AM

## 2021-10-20 NOTE — Progress Notes (Signed)
Received patient in bed to unit.  Alert and oriented.  Informed consent signed and in chart.   Treatment initiated: 0827 Treatment completed: 1212  Patient tolerated well.  Transported back to the room  Alert, without acute distress.  Hand-off given to patient's nurse.   Access used: Av fistula Access issues: None  Total UF removed: 0 Medication(s) given: none Post HD VS: 144/68,58,12,100% Post HD weight: 84.0   [facility]

## 2021-10-20 NOTE — Care Management Important Message (Signed)
Important Message  Patient Details  Name: [name] [name] MRN: [id number] Date of Birth: February 09, 1961   Medicare Important Message Given:  Yes     Orbie Pyo 10/20/2021, 4:05 PM

## 2021-10-21 ENCOUNTER — Inpatient Hospital Stay (HOSPITAL_COMMUNITY): Payer: Medicare Other

## 2021-10-21 ENCOUNTER — Encounter (HOSPITAL_COMMUNITY): Payer: Self-pay | Admitting: Certified Registered"

## 2021-10-21 ENCOUNTER — Encounter (HOSPITAL_COMMUNITY)
Admission: EM | Disposition: A | Discharge status: Home / Self Care | Payer: Self-pay | Source: Home / Self Care | Attending: Internal Medicine

## 2021-10-21 DIAGNOSIS — K567 Ileus, unspecified: Secondary | ICD-10-CM

## 2021-10-21 DIAGNOSIS — Z992 Dependence on renal dialysis: Secondary | ICD-10-CM

## 2021-10-21 DIAGNOSIS — I169 Hypertensive crisis, unspecified: Secondary | ICD-10-CM | POA: Diagnosis not present

## 2021-10-21 HISTORY — DX: Ileus, unspecified: K56.7

## 2021-10-21 LAB — GLUCOSE, CAPILLARY
Glucose-Capillary: 204 mg/dL — ABNORMAL HIGH (ref 70–99)
Glucose-Capillary: 228 mg/dL — ABNORMAL HIGH (ref 70–99)
Glucose-Capillary: 399 mg/dL — ABNORMAL HIGH (ref 70–99)

## 2021-10-21 SURGERY — CANCELLED PROCEDURE

## 2021-10-21 MED ORDER — INSULIN ASPART 100 UNIT/ML IJ SOLN
5.0000 [IU] | Freq: Once | INTRAMUSCULAR | Status: AC
  Administered 2021-10-21: 5 [IU] via SUBCUTANEOUS

## 2021-10-21 MED ORDER — POLYETHYLENE GLYCOL 3350 17 G PO PACK: 17.0000 g | PACK | Freq: Two times a day (BID) | ORAL | Status: DC

## 2021-10-21 MED ORDER — CALCITRIOL 0.5 MCG PO CAPS: 1.0000 ug | ORAL_CAPSULE | ORAL | 2 refills | Status: DC

## 2021-10-21 MED ORDER — ISOSORB DINITRATE-HYDRALAZINE 20-37.5 MG PO TABS: 1.0000 | ORAL_TABLET | Freq: Three times a day (TID) | ORAL | 3 refills | Status: DC

## 2021-10-21 MED ORDER — CLONIDINE HCL 0.2 MG PO TABS: 0.2000 mg | ORAL_TABLET | Freq: Three times a day (TID) | ORAL | 2 refills | Status: DC

## 2021-10-21 MED ORDER — PANTOPRAZOLE SODIUM 40 MG PO TBEC: 40.0000 mg | DELAYED_RELEASE_TABLET | Freq: Two times a day (BID) | ORAL | 1 refills | Status: DC

## 2021-10-21 MED ORDER — GABAPENTIN 300 MG PO CAPS: 300.0000 mg | ORAL_CAPSULE | Freq: Two times a day (BID) | ORAL | 2 refills | Status: DC

## 2021-10-21 MED ORDER — POLYETHYLENE GLYCOL 3350 17 G PO PACK: 17.0000 g | PACK | Freq: Two times a day (BID) | ORAL | 0 refills | Status: DC

## 2021-10-21 SURGICAL SUPPLY — 14 items

## 2021-10-21 NOTE — Progress Notes (Signed)
  Dr Kyla Balzarine rounding note reviewed, atypical chest pain in setting of hypertensive emergency  without significant objective evidence of ACS by EKG and enzymes. No plans for inpatient ishcemic testing, can reevalaute at f/u if any recurrent symptoms if may be indicated  We will sign off inpatient care.      Merrily Pew, MD  10/21/2021, 10:30 AM

## 2021-10-21 NOTE — Discharge Summary (Signed)
Physician Discharge Summary   Patient: [name] [name] MRN: [id number] DOB: [birth year]  Admit date:     10/17/2021  Discharge date: 10/21/21  Discharge Physician: Elmarie Shiley   PCP: [name], MD   Recommendations at discharge:    Adjust BP medications as needed.  Follow up resolution of Gastritis.   Discharge Diagnoses: Principal Problem:   Hypertensive crisis Active Problems:   Gastritis   End stage renal disease (HCC)   Chronic pain   Type 2 diabetes mellitus (HCC)   Ileus (HCC)  Resolved Problems:   * No resolved hospital problems. [facility] Course: 61 year old with past medical history significant for ESRD on hemodialysis Monday Wednesday and Friday, hypertension, hyperlipidemia, diabetes presents with chest pain, associated with shortness of breath.  Blood pressure was noted to be elevated 270/116.  She took extra dose of clonidine.  She had a full hemodialysis today prior to admission. Patient presented with chest pain, admitted for hypertensive crisis. Patient  develops nausea, vomiting, KUB concern with ileus, CT showed gastritis, ileus, Vs earlier SBO. Patient subsequently had BM. Vomiting resolved. She was started on Protonix. She tolerated diet. BP was better controlled.    Assessment and Plan:   1-Hypertensive Crisis/Urgency: She underwent hemodialysis 9/5. 9/06 Hydralazine and Imdur changed to Bidil.  Continue with clonidine, labetalol.  Increased clonidine to TID>  BP better controlled.   Chest pain;  Mild elevation troponin.  Report chest pain for 1 week., worse on exertion.  Cardiology consulted. ECHO Normal EF. No further cardiology plan.  Could have be related to HTN.  CTA negative for PE>   chest pain free.   Nausea, vomiting; gastritis vs ileus early SBO -KUB showed ileus vs earlier SBO.  -Lipase, LFT normal.  -PRN zofran.  -Treated IV protonix.  -CT abdomen pelvis: Diffusely thickened stomach not seen previously, could  indicate evidence of gastritis, peptic ulcer disease or infiltrating disease. There are no acute inflammatory changes or free air. Correlate clinically and consider endoscopy if clinically warranted. Mild dilatation of the proximal jejunal segments, normal caliber remaining small bowel. Probable ileus or enteritis etiology, less likely low-grade obstruction. No visible transitional segment. GI consulted. recommended support care. Repeated KUB negative for obstruction.  Started on PPI, tolerating diet, had BM. Discharge on Miralax.   Headaches: resolved.  MRI negative for stroke.    ESRD on hemodialysis Had extra hemodialysis in 9/5, HD 9/06 Continue with HD>    Chronic pain: Continue with cyclobenzaprine and gabapentin Reduce dose/    Diabetes: type 2 with Hyperglycemia.  Continue NPH SSI.  A1c 8.2   Obesity; needs life style modification.   Estimated body mass index is 34.49 kg/m as calculated from the following:   Height as of this encounter: 5\' 5"  (1.651 m).   Weight as of this encounter: 94 kg.          Consultants: Nephrology Cardiology, GI Procedures performed: HD Disposition: Home Diet recommendation:  Discharge Diet Orders (From admission, onward)     Start     Ordered   10/21/21 0000  Diet - low sodium heart healthy        10/21/21 1037           Cardiac diet DISCHARGE MEDICATION: Allergies as of 10/21/2021       Reactions   Hydrochlorothiazide Itching   Amlodipine    Foot swelling/chest pain   Duloxetine    Hallucinations    Lisinopril Cough   Metformin    Unknown  reaction   Codeine Itching   No hives or rash, takes benadryl   Dilaudid [hydromorphone Hcl] Itching   "mild itching hrs after dilaudid given"        Medication List     STOP taking these medications    Belbuca 75 MCG Film Generic drug: Buprenorphine HCl   Cinnamon 500 MG Tabs   hydrALAZINE 25 MG tablet Commonly known as: APRESOLINE       TAKE these medications     acetaminophen 500 MG tablet Commonly known as: TYLENOL Take 1,000 mg by mouth 2 (two) times daily.   albuterol 108 (90 Base) MCG/ACT inhaler Commonly known as: VENTOLIN HFA Inhale 2 puffs into the lungs every 4 (four) hours as needed for wheezing or shortness of breath.   ALPRAZolam 0.25 MG tablet Commonly known as: XANAX Take 0.25 mg by mouth 3 (three) times daily as needed for anxiety.   amitriptyline 10 MG tablet Commonly known as: ELAVIL Take 10 mg by mouth at bedtime.   calcitRIOL 0.5 MCG capsule Commonly known as: ROCALTROL Take 2 capsules (1 mcg total) by mouth every Monday, Wednesday, and Friday with hemodialysis. Start taking on: October 23, 2021   cloNIDine 0.2 MG tablet Commonly known as: CATAPRES Take 1 tablet (0.2 mg total) by mouth every 8 (eight) hours. What changed:  medication strength how much to take when to take this   cyclobenzaprine 10 MG tablet Commonly known as: FLEXERIL Take 10 mg by mouth 2 (two) times daily as needed for muscle spasms.   furosemide 80 MG tablet Commonly known as: LASIX Take 80 mg by mouth 2 (two) times daily.   gabapentin 300 MG capsule Commonly known as: NEURONTIN Take 1 capsule (300 mg total) by mouth 2 (two) times daily. What changed:  how much to take how to take this when to take this additional instructions   HumuLIN N 100 UNIT/ML injection Generic drug: insulin NPH Human Inject 15 Units into the skin 2 (two) times daily.   isosorbide-hydrALAZINE 20-37.5 MG tablet Commonly known as: BIDIL Take 1 tablet by mouth 3 (three) times daily.   ketorolac 0.5 % ophthalmic solution Commonly known as: ACULAR Place 1 drop into the left eye in the morning, at noon, and at bedtime.   labetalol 300 MG tablet Commonly known as: NORMODYNE Take 300 mg by mouth 2 (two) times daily.   lidocaine 5 % Commonly known as: LIDODERM Place 1 patch onto the skin daily.   pantoprazole 40 MG tablet Commonly known as:  Protonix Take 1 tablet (40 mg total) by mouth 2 (two) times daily.   polyethylene glycol 17 g packet Commonly known as: MIRALAX / GLYCOLAX Take 17 g by mouth 2 (two) times daily.   prednisoLONE acetate 1 % ophthalmic suspension Commonly known as: PRED FORTE Place 1 drop into the left eye 3 (three) times daily.        Follow-up Information     Revankar, Reita Cliche, MD Follow up.   Specialty: Cardiology Why: the office will call with date and time of appt  if you have not heard from them by Wed.  please call dr. Julien Nordmann office Contact information: [place] [address] [phone]         [name], MD Follow up in 1 week(s).   Specialty: Family Medicine Contact information: [address] [address] [phone]         Revankar, Reita Cliche, MD .   Specialty: Cardiology Contact information:  [address] [place] [address] [phone]                Discharge Exam: Danley Danker Weights   10/20/21 0537 10/20/21 0754 10/21/21 0520  Weight: 94 kg 94 kg 94.6 kg   General; NAD  Condition at discharge: stable  The results of significant diagnostics from this hospitalization (including imaging, microbiology, ancillary and laboratory) are listed below for reference.   Imaging Studies: DG Abd 1 View  Result Date: 10/21/2021 CLINICAL DATA:  Ileus. EXAM: ABDOMEN - 1 VIEW COMPARISON:  10/19/2021 FINDINGS: Bowel gas pattern is nonobstructive. Left air-filled small bowel loops visualized. Air present throughout the colon. No dilated loops of bowel or air-fluid levels. No free peritoneal air. Remainder of the exam is unchanged. IMPRESSION: Nonobstructive bowel gas pattern. Electronically Signed   By: Marin Olp M.D.   On: 10/21/2021 09:26   CT ABDOMEN PELVIS WO CONTRAST  Result Date: 10/20/2021 CLINICAL DATA:  Nausea and vomiting. EXAM: CT ABDOMEN AND PELVIS WITHOUT CONTRAST TECHNIQUE: Multidetector CT imaging of the abdomen and pelvis  was performed following the standard protocol without IV contrast. RADIATION DOSE REDUCTION: This exam was performed according to the departmental dose-optimization program which includes automated exposure control, adjustment of the mA and/or kV according to patient size and/or use of iterative reconstruction technique. COMPARISON:  CT without contrast 10/02/2018. FINDINGS: Lower chest: The heart is slightly enlarged. There is no pericardial effusion. Minimal posterior atelectasis is noted in the lower lobes without infiltrates. Hepatobiliary: Old cholecystectomy. No biliary dilatation. Liver is unremarkable without contrast. Pancreas: No focal abnormality without contrast. Spleen: Unremarkable without contrast.  No splenomegaly. Adrenals/Urinary Tract: There is no adrenal mass. Both kidneys are small in length, with the right measuring 8.3 cm length and the left 8.0 cm with at least mild interval volume loss. There is a 1.8 cm homogeneous posterior left renal cyst of 8 Hounsfield units requiring no further follow-up. This was slightly smaller previously. The unenhanced kidneys are otherwise unremarkable. There is no hydronephrosis. Contrast faintly opacifies the collecting systems and could obscure stones but there were no stones present 3 years ago. The wall and lumen of the bladder is unremarkable. Stomach/Bowel: There are diffuse thickened folds in the stomach., mild fold thickening in the duodenum. There is mild dilatation of the proximal jejunal segments up to 3.1 cm without visible transition Rest of unopacified small bowel unremarkable and normal caliber. The appendix surgically absent as before. No suspicious abnormality is seen of the large bowel wall. Vascular/Lymphatic: Aortic atherosclerosis. No enlarged abdominal or pelvic lymph nodes. Reproductive: Status post hysterectomy. No adnexal masses. Other: Numerous pelvic phleboliths. Small umbilical fat hernia. Trace presacral pelvic ascites without  further ascites. There is no incarcerated hernia. There is no free air, free hemorrhage or abscess. There is subcutaneous stranding to the right and left in the mid abdominal wall most likely due to subcutaneous injections. Musculoskeletal: No acute or significant osseous findings. IMPRESSION: 1. Diffusely thickened stomach not seen previously, could indicate evidence of gastritis, peptic ulcer disease or infiltrating disease. There are no acute inflammatory changes or free air. Correlate clinically and consider endoscopy if clinically warranted. 2. Mild dilatation of the proximal jejunal segments, normal caliber remaining small bowel. Probable ileus or enteritis etiology, less likely low-grade obstruction. No visible transitional segment. 3. Small kidneys, with contrast in the collecting systems and bladder. 4. Mild cardiomegaly. 5. Aortic atherosclerosis. Electronically Signed   By: Telford Nab M.D.   On: 10/20/2021 07:26   MR BRAIN  WO CONTRAST  Result Date: 10/19/2021 CLINICAL DATA:  Headache, new or worsening (Age >= 50y) EXAM: MRI HEAD WITHOUT CONTRAST TECHNIQUE: Multiplanar, multiecho pulse sequences of the brain and surrounding structures were obtained without intravenous contrast. COMPARISON:  CT head May 2021. FINDINGS: Brain: No acute infarction, hemorrhage, hydrocephalus, extra-axial collection or mass lesion. Vascular: Major arterial flow voids are maintained at the skull base. Skull and upper cervical spine: Normal marrow signal. Sinuses/Orbits: Clear sinuses.  No acute orbital findings. Other: No sizable mastoid effusions. IMPRESSION: Normal brain MRI.  No acute abnormality. Electronically Signed   By: Margaretha Sheffield M.D.   On: 10/19/2021 19:05   DG Abd 1 View  Result Date: 10/19/2021 CLINICAL DATA:  Vomiting, dizziness, nausea. EXAM: ABDOMEN - 1 VIEW COMPARISON:  CT chest 10/18/2021, 09/06/2009 KUB FINDINGS: There is residual contrast in the urinary bladder following CT chest on  10/18/2021. There is mild prominence of small bowel loops, consistent with ileus or early small bowel obstruction. Colonic loops are normal in caliber. No evidence for organomegaly or free intraperitoneal air. Surgical clips are present in the RIGHT UPPER QUADRANT of the abdomen. IMPRESSION: Mildly prominent loops of small bowel consistent with ileus or early small bowel obstruction. Electronically Signed   By: Nolon Nations M.D.   On: 10/19/2021 14:32   ECHOCARDIOGRAM COMPLETE  Result Date: 10/19/2021    ECHOCARDIOGRAM REPORT   Patient Name:   LILYANAH CELESTIN [name] Date of Exam: 10/19/2021 Medical Rec #:  [id number]     Height:       65.0 in Accession #:    [id number]    Weight:       208.1 lb Date of Birth:  Aug 30, 1960     BSA:          2.012 m Patient Age:    24 years      BP:           183/68 mmHg Patient Gender: F             HR:           67 bpm. Exam Location:  Inpatient Procedure: 2D Echo Indications:    Chest Pain  History:        Patient has prior history of Echocardiogram examinations, most                 recent 08/07/2018. Abnormal ECG, Signs/Symptoms:Chest Pain and                 Shortness of Breath; Risk Factors:Dyslipidemia, Diabetes and                 Hypertension.  Sonographer:    Harvie Junior Referring Phys: [id number] Lily Kocher  Sonographer Comments: Image acquisition challenging due to patient body habitus. IMPRESSIONS  1. Left ventricular ejection fraction, by estimation, is 60 to 65%. The left ventricle has normal function. The left ventricle has no regional wall motion abnormalities. There is moderate concentric left ventricular hypertrophy. Left ventricular diastolic parameters are consistent with Grade I diastolic dysfunction (impaired relaxation). Elevated left atrial pressure.  2. Right ventricular systolic function is normal. The right ventricular size is normal.  3. Left atrial size was mildly dilated.  4. The mitral valve is normal in structure. Trivial mitral valve regurgitation. No  evidence of mitral stenosis.  5. The aortic valve is tricuspid. Aortic valve regurgitation is trivial. Aortic valve sclerosis is present, with no evidence of aortic valve stenosis. FINDINGS  Left Ventricle: Left ventricular ejection fraction,  by estimation, is 60 to 65%. The left ventricle has normal function. The left ventricle has no regional wall motion abnormalities. The left ventricular internal cavity size was normal in size. There is  moderate concentric left ventricular hypertrophy. Left ventricular diastolic parameters are consistent with Grade I diastolic dysfunction (impaired relaxation). Elevated left atrial pressure. Right Ventricle: The right ventricular size is normal. Right ventricular systolic function is normal. Left Atrium: Left atrial size was mildly dilated. Right Atrium: Right atrial size was normal in size. Pericardium: There is no evidence of pericardial effusion. Mitral Valve: The mitral valve is normal in structure. Trivial mitral valve regurgitation. No evidence of mitral valve stenosis. Tricuspid Valve: The tricuspid valve is normal in structure. Tricuspid valve regurgitation is trivial. No evidence of tricuspid stenosis. Aortic Valve: The aortic valve is tricuspid. Aortic valve regurgitation is trivial. Aortic valve sclerosis is present, with no evidence of aortic valve stenosis. Aortic valve mean gradient measures 6.0 mmHg. Aortic valve peak gradient measures 11.8 mmHg.  Aortic valve area, by VTI measures 1.91 cm. Pulmonic Valve: The pulmonic valve was normal in structure. Pulmonic valve regurgitation is not visualized. No evidence of pulmonic stenosis. Aorta: The aortic root is normal in size and structure. Venous: The inferior vena cava was not well visualized. IAS/Shunts: The interatrial septum was not well visualized.  LEFT VENTRICLE PLAX 2D LVIDd:         4.80 cm      Diastology LVIDs:         3.40 cm      LV e' medial:    3.37 cm/s LV PW:         1.20 cm      LV E/e' medial:   32.3 LV IVS:        1.20 cm      LV e' lateral:   4.46 cm/s LVOT diam:     1.80 cm      LV E/e' lateral: 24.4 LV SV:         64 LV SV Index:   32 LVOT Area:     2.54 cm  LV Volumes (MOD) LV vol d, MOD A2C: 88.2 ml LV vol d, MOD A4C: 108.0 ml LV vol s, MOD A2C: 38.1 ml LV vol s, MOD A4C: 48.1 ml LV SV MOD A2C:     50.1 ml LV SV MOD A4C:     108.0 ml LV SV MOD BP:      54.4 ml RIGHT VENTRICLE RV Basal diam:  3.40 cm RV Mid diam:    2.60 cm RV S prime:     14.40 cm/s TAPSE (M-mode): 3.0 cm LEFT ATRIUM             Index        RIGHT ATRIUM           Index LA diam:        3.40 cm 1.69 cm/m   RA Area:     10.80 cm LA Vol (A2C):   52.5 ml 26.10 ml/m  RA Volume:   21.50 ml  10.69 ml/m LA Vol (A4C):   68.6 ml 34.10 ml/m LA Biplane Vol: 61.7 ml 30.67 ml/m  AORTIC VALVE                     PULMONIC VALVE AV Area (Vmax):    1.79 cm      PV Vmax:       1.10 m/s AV Area (Vmean):   1.89  cm      PV Peak grad:  4.8 mmHg AV Area (VTI):     1.91 cm AV Vmax:           172.00 cm/s AV Vmean:          118.000 cm/s AV VTI:            0.335 m AV Peak Grad:      11.8 mmHg AV Mean Grad:      6.0 mmHg LVOT Vmax:         121.00 cm/s LVOT Vmean:        87.600 cm/s LVOT VTI:          0.251 m LVOT/AV VTI ratio: 0.75  AORTA Ao Root diam: 2.80 cm Ao Asc diam:  3.00 cm MITRAL VALVE MV Area (PHT): 3.54 cm     SHUNTS MV Decel Time: 214 msec     Systemic VTI:  0.25 m MV E velocity: 109.00 cm/s  Systemic Diam: 1.80 cm MV A velocity: 119.00 cm/s MV E/A ratio:  0.92 Kirk Ruths MD Electronically signed by Kirk Ruths MD Signature Date/Time: 10/19/2021/12:08:30 PM    Final    CT Angio Chest Pulmonary Embolism (PE) W or WO Contrast  Result Date: 10/18/2021 CLINICAL DATA:  Shortness of breath and hypertension. EXAM: CT ANGIOGRAPHY CHEST WITH CONTRAST TECHNIQUE: Multidetector CT imaging of the chest was performed using the standard protocol during bolus administration of intravenous contrast. Multiplanar CT image reconstructions and MIPs were  obtained to evaluate the vascular anatomy. RADIATION DOSE REDUCTION: This exam was performed according to the departmental dose-optimization program which includes automated exposure control, adjustment of the mA and/or kV according to patient size and/or use of iterative reconstruction technique. CONTRAST:  49mL OMNIPAQUE IOHEXOL 350 MG/ML SOLN COMPARISON:  CT scan from 2017 FINDINGS: Cardiovascular: The heart is borderline enlarged. There appears to be a at least moderate left ventricular hypertrophy. The aorta is normal in caliber. No dissection or atherosclerotic calcification. The pulmonary arteries are well opacified. No filling defects to suggest pulmonary embolism. Mediastinum/Nodes: No mediastinal or hilar mass or lymphadenopathy. The esophagus is grossly normal. Lungs/Pleura: No acute pulmonary findings. No infiltrates, edema or effusions. No pulmonary lesions. Upper Abdomen: No significant upper abdominal findings. Musculoskeletal: No breast masses, supraclavicular or axillary adenopathy. The thyroid gland is unremarkable. The bony thorax is intact. Review of the MIP images confirms the above findings. IMPRESSION: 1. No CT findings for pulmonary embolism. 2. Normal thoracic aorta. 3. Borderline cardiomegaly with left ventricular hypertrophy. 4. No acute pulmonary findings. Electronically Signed   By: Marijo Sanes M.D.   On: 10/18/2021 21:11   DG Chest 2 View  Result Date: 10/17/2021 CLINICAL DATA:  Chest pain. EXAM: CHEST - 2 VIEW COMPARISON:  Chest radiograph dated 03/21/2020. FINDINGS: There is mild cardiomegaly with mild vascular congestion and interstitial edema. No focal consolidation, pleural effusion or pneumothorax. No acute osseous pathology. IMPRESSION: Mild cardiomegaly with mild vascular congestion and interstitial edema. Electronically Signed   By: Anner Crete M.D.   On: 10/17/2021 02:41    Microbiology: Results for orders placed or performed during the hospital encounter of  10/17/21  SARS Coronavirus 2 by RT PCR (hospital order, performed in [facility] hospital lab) *cepheid single result test* Anterior Nasal Swab     Status: None   Collection Time: 10/17/21  7:50 AM   Specimen: Anterior Nasal Swab  Result Value Ref Range Status   SARS Coronavirus 2 by RT PCR NEGATIVE NEGATIVE Final  Comment: (NOTE) SARS-CoV-2 target nucleic acids are NOT DETECTED.  The SARS-CoV-2 RNA is generally detectable in upper and lower respiratory specimens during the acute phase of infection. The lowest concentration of SARS-CoV-2 viral copies this assay can detect is 250 copies / mL. A negative result does not preclude SARS-CoV-2 infection and should not be used as the sole basis for treatment or other patient management decisions.  A negative result may occur with improper specimen collection / handling, submission of specimen other than nasopharyngeal swab, presence of viral mutation(s) within the areas targeted by this assay, and inadequate number of viral copies (<250 copies / mL). A negative result must be combined with clinical observations, patient history, and epidemiological information.  Fact Sheet for Patients:   https://www.patel.info/  Fact Sheet for Healthcare Providers: https://hall.com/  This test is not yet approved or  cleared by the [place] FDA and has been authorized for detection and/or diagnosis of SARS-CoV-2 by FDA under an Emergency Use Authorization (EUA).  This EUA will remain in effect (meaning this test can be used) for the duration of the COVID-19 declaration under Section 564(b)(1) of the Act, 21 U.S.C. section 360bbb-3(b)(1), unless the authorization is terminated or revoked sooner.  Performed at [facility], [place] [address], [place], [place] [address]     Labs: CBC: Recent Labs  Lab 10/17/21 0207 10/18/21 0921 10/20/21 0837  WBC 10.5 12.5* 11.5*  HGB 10.0* 10.5* 9.6*  HCT  29.1* 29.4* 27.4*  MCV 86.9 83.5 85.4  PLT 214 191 888   Basic Metabolic Panel: Recent Labs  Lab 10/17/21 0207 10/18/21 0921 10/20/21 0837  NA 142 137 135  K 3.5 3.9 3.8  CL 88* 91* 95*  CO2 41* 31 26  GLUCOSE 215* 170* 210*  BUN 25* 26* 38*  CREATININE 4.66* 4.60* 7.03*  CALCIUM 9.1 9.7 9.3  MG  --   --  1.8  PHOS  --   --  5.6*   Liver Function Tests: Recent Labs  Lab 10/19/21 1327 10/20/21 0837  AST 13*  --   ALT 13  --   ALKPHOS 72  --   BILITOT 0.5  --   PROT 6.9  --   ALBUMIN 3.5 3.3*   CBG: Recent Labs  Lab 10/20/21 1609 10/20/21 2124 10/21/21 0011 10/21/21 0620 10/21/21 1157  GLUCAP 185* 429* 399* 204* 228*    Discharge time spent: greater than 30 minutes.  Signed: Elmarie Shiley, MD [facility] 10/21/2021

## 2021-10-21 NOTE — Progress Notes (Addendum)
[facility] ROUNDING NOTE   Subjective: Overall feels better, tolerated regular diet last night for dinner.  No vomiting or nausea.   Objective: Vital signs in last 24 hours: Temp:  [97.7 F (36.5 C)-98.9 F (37.2 C)] 98.1 F (36.7 C) (09/09 0902) Pulse Rate:  [2-83] 62 (09/09 0902) Resp:  [15-20] 18 (09/09 0902) BP: (121-153)/(55-81) 149/67 (09/09 0902) SpO2:  [95 %-100 %] 96 % (09/09 0902) Weight:  [94.6 kg] 94.6 kg (09/09 0520) Last BM Date : 10/18/21 General: NAD Abdomen: Soft, mild distention with no tenderness or rebound     Intake/Output from previous day: No intake/output data recorded. Intake/Output this shift: No intake/output data recorded.   Lab Results: Recent Labs    10/20/21 0837  WBC 11.5*  HGB 9.6*  PLT 228  MCV 85.4   BMET Recent Labs    10/20/21 0837  NA 135  K 3.8  CL 95*  CO2 26  GLUCOSE 210*  BUN 38*  CREATININE 7.03*  CALCIUM 9.3   LFT Recent Labs    10/19/21 1327 10/20/21 0837  PROT 6.9  --   ALBUMIN 3.5 3.3*  AST 13*  --   ALT 13  --   ALKPHOS 72  --   BILITOT 0.5  --   BILIDIR <0.1  --   IBILI NOT CALCULATED  --    PT/INR No results for input(s): "INR" in the last 72 hours.    Imaging/Other results: DG Abd 1 View  Result Date: 10/21/2021 CLINICAL DATA:  Ileus. EXAM: ABDOMEN - 1 VIEW COMPARISON:  10/19/2021 FINDINGS: Bowel gas pattern is nonobstructive. Left air-filled small bowel loops visualized. Air present throughout the colon. No dilated loops of bowel or air-fluid levels. No free peritoneal air. Remainder of the exam is unchanged. IMPRESSION: Nonobstructive bowel gas pattern. Electronically Signed   By: Marin Olp M.D.   On: 10/21/2021 09:26   CT ABDOMEN PELVIS WO CONTRAST  Result Date: 10/20/2021 CLINICAL DATA:  Nausea and vomiting. EXAM: CT ABDOMEN AND PELVIS WITHOUT CONTRAST TECHNIQUE: Multidetector CT imaging of the abdomen and pelvis was performed following the standard protocol without IV  contrast. RADIATION DOSE REDUCTION: This exam was performed according to the departmental dose-optimization program which includes automated exposure control, adjustment of the mA and/or kV according to patient size and/or use of iterative reconstruction technique. COMPARISON:  CT without contrast 10/02/2018. FINDINGS: Lower chest: The heart is slightly enlarged. There is no pericardial effusion. Minimal posterior atelectasis is noted in the lower lobes without infiltrates. Hepatobiliary: Old cholecystectomy. No biliary dilatation. Liver is unremarkable without contrast. Pancreas: No focal abnormality without contrast. Spleen: Unremarkable without contrast.  No splenomegaly. Adrenals/Urinary Tract: There is no adrenal mass. Both kidneys are small in length, with the right measuring 8.3 cm length and the left 8.0 cm with at least mild interval volume loss. There is a 1.8 cm homogeneous posterior left renal cyst of 8 Hounsfield units requiring no further follow-up. This was slightly smaller previously. The unenhanced kidneys are otherwise unremarkable. There is no hydronephrosis. Contrast faintly opacifies the collecting systems and could obscure stones but there were no stones present 3 years ago. The wall and lumen of the bladder is unremarkable. Stomach/Bowel: There are diffuse thickened folds in the stomach., mild fold thickening in the duodenum. There is mild dilatation of the proximal jejunal segments up to 3.1 cm without visible transition Rest of unopacified small bowel unremarkable and normal caliber. The appendix surgically absent as before. No suspicious abnormality is seen of  the large bowel wall. Vascular/Lymphatic: Aortic atherosclerosis. No enlarged abdominal or pelvic lymph nodes. Reproductive: Status post hysterectomy. No adnexal masses. Other: Numerous pelvic phleboliths. Small umbilical fat hernia. Trace presacral pelvic ascites without further ascites. There is no incarcerated hernia. There is no  free air, free hemorrhage or abscess. There is subcutaneous stranding to the right and left in the mid abdominal wall most likely due to subcutaneous injections. Musculoskeletal: No acute or significant osseous findings. IMPRESSION: 1. Diffusely thickened stomach not seen previously, could indicate evidence of gastritis, peptic ulcer disease or infiltrating disease. There are no acute inflammatory changes or free air. Correlate clinically and consider endoscopy if clinically warranted. 2. Mild dilatation of the proximal jejunal segments, normal caliber remaining small bowel. Probable ileus or enteritis etiology, less likely low-grade obstruction. No visible transitional segment. 3. Small kidneys, with contrast in the collecting systems and bladder. 4. Mild cardiomegaly. 5. Aortic atherosclerosis. Electronically Signed   By: Telford Nab M.D.   On: 10/20/2021 07:26   MR BRAIN WO CONTRAST  Result Date: 10/19/2021 CLINICAL DATA:  Headache, new or worsening (Age >= 50y) EXAM: MRI HEAD WITHOUT CONTRAST TECHNIQUE: Multiplanar, multiecho pulse sequences of the brain and surrounding structures were obtained without intravenous contrast. COMPARISON:  CT head May 2021. FINDINGS: Brain: No acute infarction, hemorrhage, hydrocephalus, extra-axial collection or mass lesion. Vascular: Major arterial flow voids are maintained at the skull base. Skull and upper cervical spine: Normal marrow signal. Sinuses/Orbits: Clear sinuses.  No acute orbital findings. Other: No sizable mastoid effusions. IMPRESSION: Normal brain MRI.  No acute abnormality. Electronically Signed   By: Margaretha Sheffield M.D.   On: 10/19/2021 19:05   DG Abd 1 View  Result Date: 10/19/2021 CLINICAL DATA:  Vomiting, dizziness, nausea. EXAM: ABDOMEN - 1 VIEW COMPARISON:  CT chest 10/18/2021, 09/06/2009 KUB FINDINGS: There is residual contrast in the urinary bladder following CT chest on 10/18/2021. There is mild prominence of small bowel loops, consistent  with ileus or early small bowel obstruction. Colonic loops are normal in caliber. No evidence for organomegaly or free intraperitoneal air. Surgical clips are present in the RIGHT UPPER QUADRANT of the abdomen. IMPRESSION: Mildly prominent loops of small bowel consistent with ileus or early small bowel obstruction. Electronically Signed   By: Nolon Nations M.D.   On: 10/19/2021 14:32      Assessment &Plan  61 year old very pleasant female with end-stage renal disease on dialysis admitted with hypertensive emergency S/p hemodialysis yesterday with improvement of blood pressure Chest pain and epigastric abdominal pain has since resolved She is tolerating diet with no nausea or vomiting  Gastric wall thickening likely over read by radiology for under distended stomach but cannot exclude possible gastritis.  Unlikely to be neoplastic lesion or infiltrative process given recent EGD a year ago that was unremarkable Continue diet as tolerated Oral pantoprazole 40 mg daily Antireflux measures  MiraLAX 1 capful twice daily for chronic idiopathic constipation  GI will sign off, available if have any questions  Follow-up in GI office as outpatient    K. Denzil Magnuson , MD [phone]  [facility]

## 2021-10-21 NOTE — Plan of Care (Signed)

## 2021-10-22 NOTE — TOC Transition Note (Signed)
Transition of care contact from inpatient facility  Date of discharge: 10/21/21 Date of contact: 10/22/21 Method: Phone Spoke to: Patient's Daughter  Patient's daughter contacted to discuss transition of care from recent inpatient hospitalization. Patient was admitted to [facility] from 10/17/21-10/21/21 with discharge diagnosis of Hypertensive Crisis and Gastritis.  Medication changes were reviewed.  Patient will follow up with her outpatient HD unit on: 10/23/21 at [facility].  Tobie Poet, NP

## 2021-10-31 ENCOUNTER — Telehealth: Payer: Self-pay | Admitting: Nurse Practitioner

## 2021-10-31 NOTE — Telephone Encounter (Signed)
Pt stated that she was having epigastric pain and was recently hospitalized: Chart reviewed: Pt taking Protonix 40 mg twice a day: Pt was scheduled for an office visit with Alonza Bogus PA on 11/21/2021 at 3:30: Pt made aware Pt verbalized understanding with all questions answered.

## 2021-10-31 NOTE — Telephone Encounter (Signed)
Inbound call from patient stating that she has been throwing up and just not feeling well and wanted to make an appointment. I advised patient that the next appointment was not until October. Patient stated that she could not wait that long and needed to be seen sooner. Patient is requesting a call back to discuss. Please advise.

## 2021-11-08 ENCOUNTER — Encounter: Payer: Self-pay | Admitting: Physician Assistant

## 2021-11-08 ENCOUNTER — Ambulatory Visit (INDEPENDENT_AMBULATORY_CARE_PROVIDER_SITE_OTHER): Payer: Medicare Other | Admitting: Physician Assistant

## 2021-11-08 VITALS — BP 130/90 | HR 65 | Ht 65.0 in | Wt 208.0 lb

## 2021-11-08 DIAGNOSIS — R1013 Epigastric pain: Secondary | ICD-10-CM | POA: Diagnosis not present

## 2021-11-08 DIAGNOSIS — N186 End stage renal disease: Secondary | ICD-10-CM

## 2021-11-08 DIAGNOSIS — Z992 Dependence on renal dialysis: Secondary | ICD-10-CM

## 2021-11-08 MED ORDER — FAMOTIDINE 40 MG PO TABS: 40.0000 mg | ORAL_TABLET | Freq: Two times a day (BID) | ORAL | 3 refills | Status: DC

## 2021-11-08 NOTE — Patient Instructions (Addendum)
We have sent the following medications to your pharmacy for you to pick up at your convenience: Famotidine 40 mg twice daily.   Make sure you are on Pantoprazole 40 mg twice daily 30-60 minutes before breakfast and dinner.   _______________________________________________________  If you are age 60 or older, your body mass index should be between 23-30. Your Body mass index is 34.61 kg/m. If this is out of the aforementioned range listed, please consider follow up with your Primary Care Provider.  If you are age 45 or younger, your body mass index should be between 19-25. Your Body mass index is 34.61 kg/m. If this is out of the aformentioned range listed, please consider follow up with your Primary Care Provider.   ________________________________________________________  The [facility] providers would like to encourage you to use [facility] to communicate with providers for non-urgent requests or questions.  Due to long hold times on the telephone, sending your provider a message by [facility] may be a faster and more efficient way to get a response.  Please allow 48 business hours for a response.  Please remember that this is for non-urgent requests.  _______________________________________________________

## 2021-11-08 NOTE — Progress Notes (Signed)
Chief Complaint: Epigastric pain  HPI:    [name] [name] is a 61 year old African-American female with a past medical history of CKD, GERD, hepatitis C status posttreatment with Harvoni and multiple others listed below, known to Dr. Loletha Carrow, who was referred to me by [name], MD for a complaint of epigastric pain.    03/14/2021 patient seen in clinic by [facility] for anemia follow-up and dark stools.   At that time discussed previous history of H. pylori treated in 2018.  She was sent home with heme cards and told to monitor her stool.  Discussed risk of small bowel AVMs secondary to history of ESRD on HD.  That time Dr. Loletha Carrow recommended if heme positive she would need a small bowel enteroscopy in the hospital outpatient endoscopy lab.    10/17/2021-10/21/2021 patient admitted with chest pain and shortness of breath.  She had a KUB with concern of ileus no CT showed gastritis, ileus versus early or SBO.  CT then had a bowel movement and vomiting resolved.  She was started on Pantoprazole.  Cardiology was consulted that admission and patient had a normal EF on echo and CTA negative for PE.  Our service consulted on the patient last progress note 10/21/2021 discusses that she was tolerating diet with no further nausea or vomiting.  The gastric wall thickening was likely an overread by radiology for under distended stomach.  Discussed that is unlikely to be neoplastic lesion or infiltrative process given recent EGD a year prior that was unremarkable.  She was continued on Pantoprazole 40 mg.    Today, the patient tells me that she has continued with epigastric pain ever since discharge.  She thinks, but is not positive, that she is taking her Pantoprazole 40 mg twice a day.  She thinks this is the "football shaped pill" in her pillbox.  Tells me that she continues with a burning sensation all throughout her epigastrium that seems to radiate down throughout her abdomen and up into her throat.  This is  worse when she is doing dialysis and also worse after eating just about anything.  She has backed off her diet to mostly chicken noodle soup which tends to go down okay.  No nausea or vomiting.  Bowel movements are currently normal.    Denies fever, chills, weight loss or symptoms that awaken her from sleep.  Previous GI procedures: EGD 06/14/2020: - The entire examined colon is normal on direct and retroflexion views. - No specimens collected   Colonoscopy 06/14/2020: - The entire examined colon is normal on direct and retroflexion views. - No specimens collected   EGD 10/03/2016: - Normal larynx. - Normal esophagus. - Gastric erosions. Biopsied. - Normal examined duodenum. - CHRONIC ACTIVE GASTRITIS WITH HELICOBACTER PYLORI. - NO INTESTINAL METAPLASIA, DYSPLASIA, OR MALIGNANCY. - Post treatment H. Pylori breath test negative 11/09/2016   EGD 08/14/2016: Clean based distal gastric ulcer (likely NSAID related given her heavy use recently). - Mild pan-gastritis, biopsied to check for H. pylori. HELICOBACTER PYLORI GASTRITIS NEGATIVE FOR ATYPIA OR MALIGNANCY Past Medical History:  Diagnosis Date   Anemia    Anxiety    Arthritis    "hips" (08/28/2013)   Asthma    Chronic kidney disease    Chronic lower back pain    10-27-13 not a problem at this time   Depression    Esophageal spasm    Frequent UTI    GERD (gastroesophageal reflux disease)    Hepatitis C    genotype  1B   High cholesterol    Hypertension    Migraine    "monthly" (08/28/2013)   Pneumonia    "several times"   Type II diabetes mellitus ([place])    Ulcer    hx gastric ulcers    Past Surgical History:  Procedure Laterality Date   ABDOMINAL HYSTERECTOMY     APPENDECTOMY     AV FISTULA PLACEMENT Left 05/19/2019   Procedure: LEFT ARM Brachiocephalic ARTERIOVENOUS (AV) FISTULA CREATION;  Surgeon: Waynetta Sandy, MD;  Location: [place];  Service: Vascular;  Laterality: Left;   AV FISTULA PLACEMENT Left  07/23/2019   Procedure: LEFT ARM ARTERIOVENOUS (AV) FISTULA CREATION;  Surgeon: Waynetta Sandy, MD;  Location: [place];  Service: Vascular;  Laterality: Left;   BALLOON DILATION N/A 12/29/2012   Procedure: Larrie Kass DILATION;  Surgeon: Inda Castle, MD;  Location: [facility];  Service: Endoscopy;  Laterality: N/A;   BALLOON DILATION N/A 06/25/2013   Procedure: BALLOON DILATION;  Surgeon: Inda Castle, MD;  Location: [facility];  Service: Endoscopy;  Laterality: N/A;   BASCILIC VEIN TRANSPOSITION Left 09/24/2019   Procedure: SECOND STAGE LEFT BASCILIC VEIN TRANSPOSITION;  Surgeon: Waynetta Sandy, MD;  Location: [place];  Service: Vascular;  Laterality: Left;   BOTOX INJECTION  12/29/2012   Procedure: BOTOX INJECTION;  Surgeon: Inda Castle, MD;  Location: [facility];  Service: Endoscopy;;   BOTOX INJECTION N/A 06/25/2013   Procedure: BOTOX INJECTION;  Surgeon: Inda Castle, MD;  Location: [facility];  Service: Endoscopy;  Laterality: N/A;   CARDIAC CATHETERIZATION  X 2   /notes 08/28/2013   CARPAL TUNNEL RELEASE Right    CHOLECYSTECTOMY     COLONOSCOPY  04/10/2012   Normal    ESOPHAGEAL MANOMETRY N/A 12/26/2015   Procedure: ESOPHAGEAL MANOMETRY (EM);  Surgeon: Doran Stabler, MD;  Location: [facility];  Service: Gastroenterology;  Laterality: N/A;   ESOPHAGOGASTRODUODENOSCOPY N/A 12/29/2012   Procedure: ESOPHAGOGASTRODUODENOSCOPY (EGD);  Surgeon: Inda Castle, MD;  Location: [facility];  Service: Endoscopy;  Laterality: N/A;   ESOPHAGOGASTRODUODENOSCOPY N/A 06/25/2013   Procedure: ESOPHAGOGASTRODUODENOSCOPY (EGD);  Surgeon: Inda Castle, MD;  Location: [facility];  Service: Endoscopy;  Laterality: N/A;   ESOPHAGOGASTRODUODENOSCOPY N/A 11/03/2013   Procedure: ESOPHAGOGASTRODUODENOSCOPY (EGD);  Surgeon: Inda Castle, MD;  Location: [facility];  Service: Endoscopy;  Laterality: N/A;  Botox - ballon dilation   ESOPHAGOGASTRODUODENOSCOPY N/A  08/14/2016   Procedure: ESOPHAGOGASTRODUODENOSCOPY (EGD);  Surgeon: Milus Banister, MD;  Location: [facility];  Service: Endoscopy;  Laterality: N/A;   FOOT FRACTURE SURGERY Right    HEEL SPUR EXCISION Right    TONSILLECTOMY     TUBAL LIGATION      Current Outpatient Medications  Medication Sig Dispense Refill   acetaminophen (TYLENOL) 500 MG tablet Take 1,000 mg by mouth 2 (two) times daily.     albuterol (VENTOLIN HFA) 108 (90 Base) MCG/ACT inhaler Inhale 2 puffs into the lungs every 4 (four) hours as needed for wheezing or shortness of breath.      ALPRAZolam (XANAX) 0.25 MG tablet Take 0.25 mg by mouth 3 (three) times daily as needed for anxiety.     amitriptyline (ELAVIL) 10 MG tablet Take 10 mg by mouth at bedtime.     calcitRIOL (ROCALTROL) 0.5 MCG capsule Take 2 capsules (1 mcg total) by mouth every Monday, Wednesday, and Friday with hemodialysis. 90 capsule 2   cloNIDine (CATAPRES) 0.2 MG tablet Take 1 tablet (0.2 mg total) by  mouth every 8 (eight) hours. 90 tablet 2   cyclobenzaprine (FLEXERIL) 10 MG tablet Take 10 mg by mouth 2 (two) times daily as needed for muscle spasms.     furosemide (LASIX) 80 MG tablet Take 80 mg by mouth 2 (two) times daily.      gabapentin (NEURONTIN) 300 MG capsule Take 1 capsule (300 mg total) by mouth 2 (two) times daily. 60 capsule 2   HUMULIN N 100 UNIT/ML injection Inject 15 Units into the skin 2 (two) times daily.     isosorbide-hydrALAZINE (BIDIL) 20-37.5 MG tablet Take 1 tablet by mouth 3 (three) times daily. 90 tablet 3   labetalol (NORMODYNE) 300 MG tablet Take 300 mg by mouth 2 (two) times daily.     lidocaine (LIDODERM) 5 % Place 1 patch onto the skin daily.     pantoprazole (PROTONIX) 40 MG tablet Take 1 tablet (40 mg total) by mouth 2 (two) times daily. 30 tablet 1   polyethylene glycol (MIRALAX / GLYCOLAX) 17 g packet Take 17 g by mouth 2 (two) times daily. 14 each 0   ketorolac (ACULAR) 0.5 % ophthalmic solution Place 1 drop into the  left eye in the morning, at noon, and at bedtime. (Patient not taking: Reported on 11/08/2021)     prednisoLONE acetate (PRED FORTE) 1 % ophthalmic suspension Place 1 drop into the left eye 3 (three) times daily. (Patient not taking: Reported on 11/08/2021)     No current facility-administered medications for this visit.   Facility-Administered Medications Ordered in Other Visits  Medication Dose Route Frequency Provider Last Rate Last Admin   0.9 %  sodium chloride infusion   Intravenous Continuous PRN Neldon Newport, CRNA   New Bag at 07/02/19 1304    Allergies as of 11/08/2021 - Review Complete 11/08/2021  Allergen Reaction Noted   Hydrochlorothiazide Itching    Amlodipine  05/12/2019   Duloxetine  05/12/2019   Lisinopril Cough 06/06/2018   Metformin     Codeine Itching 03/10/2008   Dilaudid [hydromorphone hcl] Itching 08/28/2013    Family History  Problem Relation Age of Onset   Hypertension Mother    Alzheimer's disease Father    Throat cancer Sister 66       died at age 70   Diabetes Brother    Colon cancer Neg Hx    Rectal cancer Neg Hx    Stomach cancer Neg Hx     Social History   Socioeconomic History   Marital status: Married    Spouse name: Lanny Hurst    Number of children: 2   Years of education: Not on file   Highest education level: Not on file  Occupational History   Occupation: disabled  Tobacco Use   Smoking status: Never   Smokeless tobacco: Never  Vaping Use   Vaping Use: Never used  Substance and Sexual Activity   Alcohol use: No   Drug use: No   Sexual activity: Yes    Partners: Male  Other Topics Concern   Not on file  Social History Narrative   Not on file   Social Determinants of Health   Financial Resource Strain: Not on file  Food Insecurity: Not on file  Transportation Needs: Not on file  Physical Activity: Not on file  Stress: Not on file  Social Connections: Not on file  Intimate Partner Violence: Not on file    Review of  Systems:    Constitutional: No weight loss, fever or chills Cardiovascular: No chest  pain Respiratory: No SOB  Gastrointestinal: See HPI and otherwise negative   Physical Exam:  Vital signs: BP (!) 130/90   Pulse 65   Ht 5\' 5"  (1.651 m)   Wt 208 lb (94.3 kg)   BMI 34.61 kg/m    Constitutional:   Pleasant AA female appears to be in NAD, Well developed, Well nourished, alert and cooperative Respiratory: Respirations even and unlabored. Lungs clear to auscultation bilaterally.   No wheezes, crackles, or rhonchi.  Cardiovascular: Normal S1, S2. No MRG. Regular rate and rhythm. No peripheral edema, cyanosis or pallor.  Gastrointestinal:  Soft, nondistended, moderate epigastric and midline abdominal TTP, no rebound or guarding. Normal bowel sounds. No appreciable masses or hepatomegaly. Rectal:  Not performed.  Psychiatric: Oriented to person, place and time. Demonstrates good judgement and reason without abnormal affect or behaviors.  RELEVANT LABS AND IMAGING: CBC    Component Value Date/Time   WBC 11.5 (H) 10/20/2021 0837   RBC 3.21 (L) 10/20/2021 0837   HGB 9.6 (L) 10/20/2021 0837   HCT 27.4 (L) 10/20/2021 0837   PLT 228 10/20/2021 0837   MCV 85.4 10/20/2021 0837   MCH 29.9 10/20/2021 0837   MCHC 35.0 10/20/2021 0837   RDW 17.6 (H) 10/20/2021 0837   LYMPHSABS 2.4 04/21/2020 1250   MONOABS 0.6 04/21/2020 1250   EOSABS 0.2 04/21/2020 1250   BASOSABS 0.0 04/21/2020 1250    CMP     Component Value Date/Time   NA 135 10/20/2021 0837   NA 134 08/07/2018 1016   K 3.8 10/20/2021 0837   CL 95 (L) 10/20/2021 0837   CO2 26 10/20/2021 0837   GLUCOSE 210 (H) 10/20/2021 0837   BUN 38 (H) 10/20/2021 0837   BUN 61 (H) 08/07/2018 1016   CREATININE 7.03 (H) 10/20/2021 0837   CALCIUM 9.3 10/20/2021 0837   PROT 6.9 10/19/2021 1327   ALBUMIN 3.3 (L) 10/20/2021 0837   AST 13 (L) 10/19/2021 1327   ALT 13 10/19/2021 1327   ALKPHOS 72 10/19/2021 1327   BILITOT 0.5 10/19/2021 1327    GFRNONAA 6 (L) 10/20/2021 0837   GFRAA 12 (L) 08/18/2019 1726    Assessment: 1.  Epigastric pain: Recent hospitalization for the same with nausea and vomiting, gastritis seen on CT thought more likely due to under distention in the stomach, patient started on Pantoprazole and nausea vomiting resolved during that hospitalization, now continues with epigastric burning pain, thinks most likely she is on her Pantoprazole 40 twice daily but not positive; likely ongoing gastritis +/- GERD 2.  ESRD on HD  Plan: 1.  Told patient to go home and look in her pillbox and make sure that she has Pantoprazole 40 mg twice a day and there.  She should be trying to take this 30 to 60 minutes for breakfast and again before dinner.  If she is not she was instructed to do so. 2.  Prescribed Famotidine 40 mg every morning and nightly today #60 with 3 refills. 3.  Discussed with patient that we will try to avoid repeat EGD as this is just done last year and would likely not give [place] any new answers, but if symptoms continue on medications above we may need to consider, but this would need to be done in the hospital setting given dialysis. 4.  Patient to follow in clinic with me in 4 to 6 weeks or sooner if necessary.  She was instructed to check in in the next couple weeks to let me know  how she is doing.  Ellouise Newer, PA-C [facility] 11/08/2021, 8:58 AM  Cc: [name], MD

## 2021-11-09 NOTE — Progress Notes (Signed)
____________________________________________________________  Attending physician addendum:  Thank you for sending this case to me. I have reviewed the entire note and agree with the plan.  I reviewed the CT images and the stomach is completely collapsed since all the oral contrast was in the colon.  Therefore, it is not clear there is any true gastric fold thickening.  In addition, her H. pylori eventually cleared after her second course of antibiotics as evidenced by a negative urea breath test in June 2022.  Wilfrid Lund, MD  ____________________________________________________________

## 2021-11-13 ENCOUNTER — Other Ambulatory Visit: Payer: Self-pay

## 2021-11-13 DIAGNOSIS — D649 Anemia, unspecified: Secondary | ICD-10-CM | POA: Insufficient documentation

## 2021-11-13 DIAGNOSIS — E7800 Pure hypercholesterolemia, unspecified: Secondary | ICD-10-CM | POA: Insufficient documentation

## 2021-11-13 DIAGNOSIS — M545 Low back pain, unspecified: Secondary | ICD-10-CM | POA: Insufficient documentation

## 2021-11-13 DIAGNOSIS — G43909 Migraine, unspecified, not intractable, without status migrainosus: Secondary | ICD-10-CM | POA: Insufficient documentation

## 2021-11-13 DIAGNOSIS — N390 Urinary tract infection, site not specified: Secondary | ICD-10-CM | POA: Insufficient documentation

## 2021-11-13 DIAGNOSIS — J45909 Unspecified asthma, uncomplicated: Secondary | ICD-10-CM | POA: Insufficient documentation

## 2021-11-13 DIAGNOSIS — F419 Anxiety disorder, unspecified: Secondary | ICD-10-CM | POA: Insufficient documentation

## 2021-11-13 DIAGNOSIS — J189 Pneumonia, unspecified organism: Secondary | ICD-10-CM | POA: Insufficient documentation

## 2021-11-17 ENCOUNTER — Ambulatory Visit: Payer: Medicare Other | Attending: Cardiology | Admitting: Cardiology

## 2021-11-17 ENCOUNTER — Encounter: Payer: Self-pay | Admitting: Cardiology

## 2021-11-17 VITALS — BP 146/60 | HR 75 | Ht 65.0 in | Wt 207.6 lb

## 2021-11-17 DIAGNOSIS — E669 Obesity, unspecified: Secondary | ICD-10-CM

## 2021-11-17 DIAGNOSIS — N186 End stage renal disease: Secondary | ICD-10-CM

## 2021-11-17 DIAGNOSIS — Z992 Dependence on renal dialysis: Secondary | ICD-10-CM

## 2021-11-17 DIAGNOSIS — I10 Essential (primary) hypertension: Secondary | ICD-10-CM

## 2021-11-17 NOTE — Patient Instructions (Signed)

## 2021-11-17 NOTE — Progress Notes (Signed)
Cardiology Office Note:    Date:  11/17/2021   ID:  [name] [name], DOB 1961/02/04, MRN [id number]  PCP:  [name] Sanes, MD  Cardiologist:  [name] Lindau, MD   Referring MD: [name] Sanes, MD    ASSESSMENT:    1. Essential hypertension   2. ESRD (end stage renal disease) on dialysis ([place])   3. Obesity (BMI 30.0-34.9)    PLAN:    In order of problems listed above:  Primary prevention stressed to the patient.  Importance of compliance with diet medication stressed and she vocalized understanding. Essential hypertension: Blood pressure stable and diet was emphasized.  Lifestyle modification urged. Mixed dyslipidemia: I discussed lipid lowering.  She tells me this is followed by primary care and nephrology.  She does not want any medications added to her regimen at this time.  I respect her wishes. EKG abnormality wanted to do a Chem-7 and magnesium level.  She tells me that she would rather have urgent dialysis and talk to her doctors about it. Obesity: Weight reduction stressed and diet was emphasized.  Lifestyle modification urged.  Risks of obesity explained. Patient will be seen in follow-up appointment in 12 months or earlier if the patient has any concerns    Medication Adjustments/Labs and Tests Ordered: Current medicines are reviewed at length with the patient today.  Concerns regarding medicines are outlined above.  Orders Placed This Encounter  Procedures   EKG 12-Lead   No orders of the defined types were placed in this encounter.    No chief complaint on file.    History of Present Illness:    [name] [name] is a 61 y.o. female.  Patient has past medical history of essential hypertension, mixed dyslipidemia and obesity.  She has end-stage renal disease on dialysis.  She denies any problems at this time and takes care of activities of daily living.  At the time of my evaluation, the patient is alert awake oriented and in no distress.  Past Medical History:   Diagnosis Date   Abdominal pain 08/13/2016   Abdominal pain, unspecified site 07/17/2013   Abnormal EKG 08/29/2013   Acute gastric ulcer without hemorrhage or perforation    AKI (acute kidney injury) ([place]) 11/15/2016   Anemia    Anxiety    ARTHRITIS 03/10/2008   Qualifier: Diagnosis of  By: Nolon Rod CMA ([place]), Robin     Asthma    Atypical chest pain 03/09/2009   Qualifier: Diagnosis of  By: Trellis Paganini PA-c, Amy S    BRONCHITIS, CHRONIC 03/10/2008   Qualifier: Diagnosis of  By: Nolon Rod CMA ([place]), Robin     Chest pain 08/28/2013   Chronic lower back pain    10-27-13 not a problem at this time   CKD (chronic kidney disease), stage III ([place]) 10/17/2016   CKD (chronic kidney disease), stage V ([place]) 07/02/2019   Closed fracture of coccyx ([place]) 02/17/2019   Closed fracture of phalanx of foot 02/17/2019   Corns and callosities 08/13/2016   Diabetic neuropathy associated with type 2 diabetes mellitus ([place]) 12/25/2015   Diarrhea 07/17/2013   DM (diabetes mellitus), type 2 with neurological complications ([place]) 08/13/6376   Qualifier: Diagnosis of  By: Shane Crutch, Amy S    Dyskinesia of esophagus 03/10/2008   Centricity Description: ESOPHAGEAL SPASM Qualifier: Diagnosis of  By: Nolon Rod CMA [name]), Robin   Centricity Description: ESOPHAGEAL MOTILITY DISORDER Qualifier: Diagnosis of  By: Nolon Rod CMA ([place]), Robin     Dysphagia 03/09/2009   Qualifier: Diagnosis  of  By: Shane Crutch, Amy S    End stage renal disease ([place]) 02/17/2019   Essential hypertension 03/10/2008   Qualifier: Diagnosis of  By: Nolon Rod CMA (AAMA), Robin     Frequent UTI    Gastritis 08/13/2016   GERD (gastroesophageal reflux disease)    HEPATITIS C, CHRONIC 03/10/2008   Qualifier: Diagnosis of  By: Nolon Rod CMA (AAMA), Robin     High cholesterol    History of hepatitis C 02/17/2019   Hypertensive crisis 10/17/2021   Hypertensive emergency 07/03/2019   Hypertensive renal disease 02/17/2019   Hypertensive urgency 08/28/2013   Ileus ([place])  10/21/2021   Impingement syndrome of left shoulder region 02/17/2019   Lumbar radiculopathy 07/25/2018   Added automatically from request for surgery [id number]   Microscopic hematuria 10/17/2016   Migraine    "monthly" (08/28/2013)   NAUSEA AND VOMITING 12/20/2008   Qualifier: Diagnosis of  By: Laney Potash, Pam     Pneumonia    "several times"   Type 2 diabetes mellitus ([place]) 07/02/2019    Past Surgical History:  Procedure Laterality Date   ABDOMINAL HYSTERECTOMY     APPENDECTOMY     AV FISTULA PLACEMENT Left 05/19/2019   Procedure: LEFT ARM Brachiocephalic ARTERIOVENOUS (AV) FISTULA CREATION;  Surgeon: Waynetta Sandy, MD;  Location: [facility];  Service: Vascular;  Laterality: Left;   AV FISTULA PLACEMENT Left 07/23/2019   Procedure: LEFT ARM ARTERIOVENOUS (AV) FISTULA CREATION;  Surgeon: Waynetta Sandy, MD;  Location: [facility];  Service: Vascular;  Laterality: Left;   BALLOON DILATION N/A 12/29/2012   Procedure: Larrie Kass DILATION;  Surgeon: Inda Castle, MD;  Location: [facility];  Service: Endoscopy;  Laterality: N/A;   BALLOON DILATION N/A 06/25/2013   Procedure: BALLOON DILATION;  Surgeon: Inda Castle, MD;  Location: [facility];  Service: Endoscopy;  Laterality: N/A;   BASCILIC VEIN TRANSPOSITION Left 09/24/2019   Procedure: SECOND STAGE LEFT BASCILIC VEIN TRANSPOSITION;  Surgeon: Waynetta Sandy, MD;  Location: [facility];  Service: Vascular;  Laterality: Left;   BOTOX INJECTION  12/29/2012   Procedure: BOTOX INJECTION;  Surgeon: Inda Castle, MD;  Location: [facility];  Service: Endoscopy;;   BOTOX INJECTION N/A 06/25/2013   Procedure: BOTOX INJECTION;  Surgeon: Inda Castle, MD;  Location: [facility];  Service: Endoscopy;  Laterality: N/A;   CARDIAC CATHETERIZATION  X 2   /notes 08/28/2013   CARPAL TUNNEL RELEASE Right    CHOLECYSTECTOMY     COLONOSCOPY  04/10/2012   Normal    ESOPHAGEAL MANOMETRY N/A 12/26/2015   Procedure: ESOPHAGEAL MANOMETRY  (EM);  Surgeon: Doran Stabler, MD;  [facility]: [facility];  Service: Gastroenterology;  Laterality: N/A;   ESOPHAGOGASTRODUODENOSCOPY N/A 12/29/2012   Procedure: ESOPHAGOGASTRODUODENOSCOPY (EGD);  Surgeon: Inda Castle, MD;  [facility];  Service: Endoscopy;  Laterality: N/A;   ESOPHAGOGASTRODUODENOSCOPY N/A 06/25/2013   Procedure: ESOPHAGOGASTRODUODENOSCOPY (EGD);  Surgeon: Inda Castle, MD;  Location: [facility];  Service: Endoscopy;  Laterality: N/A;   ESOPHAGOGASTRODUODENOSCOPY N/A 11/03/2013   Procedure: ESOPHAGOGASTRODUODENOSCOPY (EGD);  Surgeon: Inda Castle, MD;  Location: [facility];  Service: Endoscopy;  Laterality: N/A;  Botox - ballon dilation   ESOPHAGOGASTRODUODENOSCOPY N/A 08/14/2016   Procedure: ESOPHAGOGASTRODUODENOSCOPY (EGD);  Surgeon: Milus Banister, MD;  Location: [facility];  Service: Endoscopy;  Laterality: N/A;   FOOT FRACTURE SURGERY Right    HEEL SPUR EXCISION Right    TONSILLECTOMY     TUBAL LIGATION  Current Medications: Current Meds  Medication Sig   acetaminophen (TYLENOL) 500 MG tablet Take 1,000 mg by mouth 2 (two) times daily.   albuterol (VENTOLIN HFA) 108 (90 Base) MCG/ACT inhaler Inhale 2 puffs into the lungs every 4 (four) hours as needed for wheezing or shortness of breath.    ALPRAZolam (XANAX) 0.25 MG tablet Take 0.25 mg by mouth 3 (three) times daily as needed for anxiety.   amitriptyline (ELAVIL) 10 MG tablet Take 10 mg by mouth at bedtime.   calcitRIOL (ROCALTROL) 0.5 MCG capsule Take 2 capsules (1 mcg total) by mouth every Monday, Wednesday, and Friday with hemodialysis.   cloNIDine (CATAPRES) 0.2 MG tablet Take 1 tablet (0.2 mg total) by mouth every 8 (eight) hours.   cyclobenzaprine (FLEXERIL) 10 MG tablet Take 10 mg by mouth 2 (two) times daily as needed for muscle spasms.   famotidine (PEPCID) 40 MG tablet Take 1 tablet (40 mg total) by mouth 2 (two) times daily.   fluticasone (FLONASE) 50 MCG/ACT nasal spray  Place 2 sprays into both nostrils daily as needed for allergies or rhinitis.   furosemide (LASIX) 80 MG tablet Take 80 mg by mouth 2 (two) times daily.    gabapentin (NEURONTIN) 300 MG capsule Take 1 capsule (300 mg total) by mouth 2 (two) times daily.   HUMULIN N 100 UNIT/ML injection Inject 15 Units into the skin 2 (two) times daily.   hydrALAZINE (APRESOLINE) 25 MG tablet Take 25 mg by mouth every 12 (twelve) hours.   isosorbide-hydrALAZINE (BIDIL) 20-37.5 MG tablet Take 1 tablet by mouth 3 (three) times daily.   ketorolac (ACULAR) 0.5 % ophthalmic solution Place 1 drop into the left eye in the morning, at noon, and at bedtime.   labetalol (NORMODYNE) 300 MG tablet Take 300 mg by mouth 2 (two) times daily.   lidocaine (LIDODERM) 5 % Place 1 patch onto the skin daily.   pantoprazole (PROTONIX) 40 MG tablet Take 1 tablet (40 mg total) by mouth 2 (two) times daily.   polyethylene glycol (MIRALAX / GLYCOLAX) 17 g packet Take 17 g by mouth 2 (two) times daily.   prednisoLONE acetate (PRED FORTE) 1 % ophthalmic suspension Place 1 drop into the left eye 3 (three) times daily.     Allergies:   Hydrochlorothiazide, Amlodipine, Duloxetine, Lisinopril, Metformin, Codeine, and Dilaudid [hydromorphone hcl]   Social History   Socioeconomic History   Marital status: Married    Spouse name: Lanny Hurst    Number of children: 2   Years of education: Not on file   Highest education level: Not on file  Occupational History   Occupation: disabled  Tobacco Use   Smoking status: Never   Smokeless tobacco: Never  Vaping Use   Vaping Use: Never used  Substance and Sexual Activity   Alcohol use: No   Drug use: No   Sexual activity: Yes    Partners: Male  Other Topics Concern   Not on file  Social History Narrative   Not on file   Social Determinants of Health   Financial Resource Strain: Not on file  Food Insecurity: Not on file  Transportation Needs: Not on file  Physical Activity: Not on file   Stress: Not on file  Social Connections: Not on file     Family History: The patient's family history includes Alzheimer's disease in her father; Diabetes in her brother; Hypertension in her mother; Throat cancer (age of onset: 54) in her sister. There is no history of Colon cancer,  Rectal cancer, or Stomach cancer.  ROS:   Please see the history of present illness.    All other systems reviewed and are negative.  EKGs/Labs/Other Studies Reviewed:    The following studies were reviewed today: EKG revealed sinus rhythm and nonspecific ST-T changes and QT prolongation.   Recent Labs: 10/17/2021: B Natriuretic Peptide 366.5 10/19/2021: ALT 13 10/20/2021: BUN 38; Creatinine, Ser 7.03; Hemoglobin 9.6; Magnesium 1.8; Platelets 228; Potassium 3.8; Sodium 135  Recent Lipid Panel    Component Value Date/Time   CHOL 224 (H) 08/28/2013 0849   TRIG 103 08/28/2013 0849   HDL 61 08/28/2013 0849   CHOLHDL 3.7 08/28/2013 0849   VLDL 21 08/28/2013 0849   LDLCALC 142 (H) 08/28/2013 0849    Physical Exam:    VS:  BP (!) 146/60   Pulse 75   Ht 5\' 5"  (1.651 m)   Wt 207 lb 9.6 oz (94.2 kg)   SpO2 94%   BMI 34.55 kg/m     Wt Readings from Last 3 Encounters:  11/17/21 207 lb 9.6 oz (94.2 kg)  11/08/21 208 lb (94.3 kg)  10/21/21 208 lb 9.6 oz (94.6 kg)     GEN: Patient is in no acute distress HEENT: Normal NECK: No JVD; No carotid bruits LYMPHATICS: No lymphadenopathy CARDIAC: Hear sounds regular, 2/6 systolic murmur at the apex. RESPIRATORY:  Clear to auscultation without rales, wheezing or rhonchi  ABDOMEN: Soft, non-tender, non-distended MUSCULOSKELETAL:  No edema; No deformity  SKIN: Warm and dry NEUROLOGIC:  Alert and oriented x 3 PSYCHIATRIC:  Normal affect   Signed, [name] Lindau, MD  11/17/2021 2:44 PM    [place]

## 2021-11-21 ENCOUNTER — Ambulatory Visit: Payer: Medicare Other | Admitting: Gastroenterology

## 2021-11-23 ENCOUNTER — Ambulatory Visit: Payer: Medicare Other | Admitting: Podiatry

## 2021-12-20 ENCOUNTER — Ambulatory Visit: Payer: Medicare Other | Admitting: Physician Assistant

## 2022-01-26 ENCOUNTER — Ambulatory Visit (INDEPENDENT_AMBULATORY_CARE_PROVIDER_SITE_OTHER): Payer: Medicare Other | Admitting: Physician Assistant

## 2022-01-26 ENCOUNTER — Encounter: Payer: Self-pay | Admitting: Physician Assistant

## 2022-01-26 VITALS — BP 132/72 | HR 89 | Ht 65.0 in | Wt 216.0 lb

## 2022-01-26 DIAGNOSIS — K219 Gastro-esophageal reflux disease without esophagitis: Secondary | ICD-10-CM | POA: Diagnosis not present

## 2022-01-26 DIAGNOSIS — N186 End stage renal disease: Secondary | ICD-10-CM | POA: Diagnosis not present

## 2022-01-26 DIAGNOSIS — Z992 Dependence on renal dialysis: Secondary | ICD-10-CM

## 2022-01-26 DIAGNOSIS — R1013 Epigastric pain: Secondary | ICD-10-CM

## 2022-01-26 DIAGNOSIS — R1310 Dysphagia, unspecified: Secondary | ICD-10-CM | POA: Diagnosis not present

## 2022-01-26 NOTE — Patient Instructions (Signed)
_______________________________________________________  If you are age 60 or older, your body mass index should be between 23-30. Your Body mass index is 35.94 kg/m. If this is out of the aforementioned range listed, please consider follow up with your Primary Care Provider.  If you are age 49 or younger, your body mass index should be between 19-25. Your Body mass index is 35.94 kg/m. If this is out of the aformentioned range listed, please consider follow up with your Primary Care Provider.   ________________________________________________________  The [facility] providers would like to encourage you to use [facility] to communicate with providers for non-urgent requests or questions.  Due to long hold times on the telephone, sending your provider a message by [facility] may be a faster and more efficient way to get a response.  Please allow 48 business hours for a response.  Please remember that this is for non-urgent requests.  _______________________________________________________  Dennis Bast have been scheduled for a Barium Esophogram at [facility] (1st floor of the hospital) on 02/02/2023 at 11:00am. Please arrive 15 minutes prior to your appointment for registration. Make certain not to have anything to eat or drink 3 hours prior to your test. If you need to reschedule for any reason, please contact radiology at [phone] to do so. __________________________________________________________________ A barium swallow is an examination that concentrates on views of the esophagus. This tends to be a double contrast exam (barium and two liquids which, when combined, create a gas to distend the wall of the oesophagus) or single contrast (non-ionic iodine based). The study is usually tailored to your symptoms so a good history is essential. Attention is paid during the study to the form, structure and configuration of the esophagus, looking for functional disorders (such as aspiration, dysphagia,  achalasia, motility and reflux) EXAMINATION You may be asked to change into a gown, depending on the type of swallow being performed. A radiologist and radiographer will perform the procedure. The radiologist will advise you of the type of contrast selected for your procedure and direct you during the exam. You will be asked to stand, sit or lie in several different positions and to hold a small amount of fluid in your mouth before being asked to swallow while the imaging is performed .In some instances you may be asked to swallow barium coated marshmallows to assess the motility of a solid food bolus. The exam can be recorded as a digital or video fluoroscopy procedure. POST PROCEDURE It will take 1-2 days for the barium to pass through your system. To facilitate this, it is important, unless otherwise directed, to increase your fluids for the next 24-48hrs and to resume your normal diet.  This test typically takes about 30 minutes to perform. __________________________________________________________________________________

## 2022-01-26 NOTE — Progress Notes (Signed)
Chief Complaint: Follow-up epigastric pain and GERD  HPI:    Mrs. [name] [name] is a  61 y/o AA female, known to Dr. Loletha Carrow, with a past medical history of CKD, GERD, hepatitis C post treatment with Harvoni and multiple others, who was referred to me by [name], MD for a follow-up of epigastric pain and GERD and any complaint of dysphagia.    03/14/2021 patient seen in clinic by [facility] for anemia follow-up and dark stools.   At that time discussed previous history of H. pylori treated in 2018.  She was sent home with heme cards and told to monitor her stool.  Discussed risk of small bowel AVMs secondary to history of ESRD on HD.  That time Dr. Loletha Carrow recommended if heme positive she would need a small bowel enteroscopy in the hospital outpatient endoscopy lab.    10/17/2021-10/21/2021 patient admitted with chest pain and shortness of breath.  She had a KUB with concern of ileus no CT showed gastritis, ileus versus early or SBO.  CT then had a bowel movement and vomiting resolved.  She was started on Pantoprazole.  Cardiology was consulted that admission and patient had a normal EF on echo and CTA negative for PE.  Our service consulted on the patient last progress note 10/21/2021 discusses that she was tolerating diet with no further nausea or vomiting.  The gastric wall thickening was likely an overread by radiology for under distended stomach.  Discussed that is unlikely to be neoplastic lesion or infiltrative process given recent EGD a year prior that was unremarkable.  She was continued on Pantoprazole 40 mg.    11/08/2021 patient seen in clinic by me for continued epigastric pain, at that time told to make sure she was taking her Pantoprazole 40 twice a day and prescribed Famotidine 40 twice daily.  Explained that we will try to avoid repeat EGD as she just had this done last year and when needed in the hospital given her dialysis.  Dr. Loletha Carrow noted that she had a CT and the stomach was completely  collapsed and solid oral contrast is in the colon, therefore there was no clear gastric fold thickening.  In addition H. pylori eventually cleared after second course of antibiotics as evidenced by negative urea breath test in June 2022.    Today, the patient tells me that she continues with some epigastric discomfort almost anytime that she eats and now is having some trouble with swallowing feeling like even sometimes liquids get stuck and occasionally food will get hung.  She thinks for sure she is taking her medications correctly now.    Denies fever, chills, weight loss or blood in her stool.  Previous GI procedures: EGD 06/14/2020: - Normal esophagus. - Normal stomach. - Normal examined duodenum. - Several biopsies were obtained in the gastric body and in the gastric antrum. No source of blood seen that would contribute to this patient's normocytic anemia, which is most likely due to ESRD.  Colonoscopy 06/14/2020: - The entire examined colon is normal on direct and retroflexion views. - No specimens collected   EGD 10/03/2016: - Normal larynx. - Normal esophagus. - Gastric erosions. Biopsied. - Normal examined duodenum. - CHRONIC ACTIVE GASTRITIS WITH HELICOBACTER PYLORI. - NO INTESTINAL METAPLASIA, DYSPLASIA, OR MALIGNANCY. - Post treatment H. Pylori breath test negative 11/09/2016   EGD 08/14/2016: Clean based distal gastric ulcer (likely NSAID related given her heavy use recently). - Mild pan-gastritis, biopsied to check for H. pylori. HELICOBACTER PYLORI  GASTRITIS NEGATIVE FOR ATYPIA OR MALIGNANCY  Past Medical History:  Diagnosis Date   Abdominal pain 08/13/2016   Abdominal pain, unspecified site 07/17/2013   Abnormal EKG 08/29/2013   Acute gastric ulcer without hemorrhage or perforation    AKI (acute kidney injury) ([place]) 11/15/2016   Anemia    Anxiety    ARTHRITIS 03/10/2008   Qualifier: Diagnosis of  By: Nolon Rod CMA ([place]), Robin     Asthma    Atypical chest pain  03/09/2009   Qualifier: Diagnosis of  By: Trellis Paganini PA-c, Amy S    BRONCHITIS, CHRONIC 03/10/2008   Qualifier: Diagnosis of  By: Nolon Rod CMA (AAMA), Robin     Chest pain 08/28/2013   Chronic lower back pain    10-27-13 not a problem at this time   CKD (chronic kidney disease), stage III ([place]) 10/17/2016   CKD (chronic kidney disease), stage V ([place]) 07/02/2019   Closed fracture of coccyx ([place]) 02/17/2019   Closed fracture of phalanx of foot 02/17/2019   Corns and callosities 08/13/2016   Diabetic neuropathy associated with type 2 diabetes mellitus ([place]) 12/25/2015   Diarrhea 07/17/2013   DM (diabetes mellitus), type 2 with neurological complications ([place]) 2/72/5366   Qualifier: Diagnosis of  By: Shane Crutch, Amy S    Dyskinesia of esophagus 03/10/2008   Centricity Description: ESOPHAGEAL SPASM Qualifier: Diagnosis of  By: Nolon Rod CMA ([name]), Robin   Centricity Description: ESOPHAGEAL MOTILITY DISORDER Qualifier: Diagnosis of  By: Nolon Rod CMA ([place]), Robin     Dysphagia 03/09/2009   Qualifier: Diagnosis of  By: Shane Crutch, Amy S    End stage renal disease ([place]) 02/17/2019   Essential hypertension 03/10/2008   Qualifier: Diagnosis of  By: Nolon Rod CMA (AAMA), Robin     Frequent UTI    Gastritis 08/13/2016   GERD (gastroesophageal reflux disease)    HEPATITIS C, CHRONIC 03/10/2008   Qualifier: Diagnosis of  By: Nolon Rod CMA (AAMA), Robin     High cholesterol    History of hepatitis C 02/17/2019   Hypertensive crisis 10/17/2021   Hypertensive emergency 07/03/2019   Hypertensive renal disease 02/17/2019   Hypertensive urgency 08/28/2013   Ileus ([place]) 10/21/2021   Impingement syndrome of left shoulder region 02/17/2019   Lumbar radiculopathy 07/25/2018   Added automatically from request for surgery [id number]   Microscopic hematuria 10/17/2016   Migraine    "monthly" (08/28/2013)   NAUSEA AND VOMITING 12/20/2008   Qualifier: Diagnosis of  By: Laney Potash, Pam     Pneumonia    "several times"   Type 2  diabetes mellitus ([place]) 07/02/2019    Past Surgical History:  Procedure Laterality Date   ABDOMINAL HYSTERECTOMY     APPENDECTOMY     AV FISTULA PLACEMENT Left 05/19/2019   Procedure: LEFT ARM Brachiocephalic ARTERIOVENOUS (AV) FISTULA CREATION;  Surgeon: Waynetta Sandy, MD;  Location: [place];  Service: Vascular;  Laterality: Left;   AV FISTULA PLACEMENT Left 07/23/2019   Procedure: LEFT ARM ARTERIOVENOUS (AV) FISTULA CREATION;  Surgeon: Waynetta Sandy, MD;  Location: [place];  Service: Vascular;  Laterality: Left;   BALLOON DILATION N/A 12/29/2012   Procedure: Larrie Kass DILATION;  Surgeon: Inda Castle, MD;  Location: [facility];  Service: Endoscopy;  Laterality: N/A;   BALLOON DILATION N/A 06/25/2013   Procedure: BALLOON DILATION;  Surgeon: Inda Castle, MD;  Location: [facility];  Service: Endoscopy;  Laterality: N/A;   BASCILIC VEIN TRANSPOSITION Left 09/24/2019   Procedure: SECOND STAGE LEFT BASCILIC VEIN TRANSPOSITION;  Surgeon: Waynetta Sandy, MD;  Location: [facility];  Service: Vascular;  Laterality: Left;   BOTOX INJECTION  12/29/2012   Procedure: BOTOX INJECTION;  Surgeon: Inda Castle, MD;  Location: [facility];  Service: Endoscopy;;   BOTOX INJECTION N/A 06/25/2013   Procedure: BOTOX INJECTION;  Surgeon: Inda Castle, MD;  Location: [facility];  Service: Endoscopy;  Laterality: N/A;   CARDIAC CATHETERIZATION  X 2   /notes 08/28/2013   CARPAL TUNNEL RELEASE Right    CHOLECYSTECTOMY     COLONOSCOPY  04/10/2012   Normal    ESOPHAGEAL MANOMETRY N/A 12/26/2015   Procedure: ESOPHAGEAL MANOMETRY (EM);  Surgeon: Doran Stabler, MD;  Location: [facility];  Service: Gastroenterology;  Laterality: N/A;   ESOPHAGOGASTRODUODENOSCOPY N/A 12/29/2012   Procedure: ESOPHAGOGASTRODUODENOSCOPY (EGD);  Surgeon: Inda Castle, MD;  Location: [facility];  Service: Endoscopy;  Laterality: N/A;   ESOPHAGOGASTRODUODENOSCOPY N/A 06/25/2013   Procedure:  ESOPHAGOGASTRODUODENOSCOPY (EGD);  Surgeon: Inda Castle, MD;  Location: [facility];  Service: Endoscopy;  Laterality: N/A;   ESOPHAGOGASTRODUODENOSCOPY N/A 11/03/2013   Procedure: ESOPHAGOGASTRODUODENOSCOPY (EGD);  Surgeon: Inda Castle, MD;  Location: [facility];  Service: Endoscopy;  Laterality: N/A;  Botox - ballon dilation   ESOPHAGOGASTRODUODENOSCOPY N/A 08/14/2016   Procedure: ESOPHAGOGASTRODUODENOSCOPY (EGD);  Surgeon: Milus Banister, MD;  Location: [facility];  Service: Endoscopy;  Laterality: N/A;   FOOT FRACTURE SURGERY Right    HEEL SPUR EXCISION Right    TONSILLECTOMY     TUBAL LIGATION      Current Outpatient Medications  Medication Sig Dispense Refill   acetaminophen (TYLENOL) 500 MG tablet Take 1,000 mg by mouth 2 (two) times daily.     albuterol (VENTOLIN HFA) 108 (90 Base) MCG/ACT inhaler Inhale 2 puffs into the lungs every 4 (four) hours as needed for wheezing or shortness of breath.      ALPRAZolam (XANAX) 0.25 MG tablet Take 0.25 mg by mouth 3 (three) times daily as needed for anxiety.     cloNIDine (CATAPRES) 0.2 MG tablet Take 1 tablet (0.2 mg total) by mouth every 8 (eight) hours. 90 tablet 2   cyclobenzaprine (FLEXERIL) 10 MG tablet Take 10 mg by mouth 2 (two) times daily as needed for muscle spasms.     famotidine (PEPCID) 40 MG tablet Take 1 tablet (40 mg total) by mouth 2 (two) times daily. 60 tablet 3   fluticasone (FLONASE) 50 MCG/ACT nasal spray Place 2 sprays into both nostrils daily as needed for allergies or rhinitis.     furosemide (LASIX) 80 MG tablet Take 80 mg by mouth 2 (two) times daily.      gabapentin (NEURONTIN) 300 MG capsule Take 1 capsule (300 mg total) by mouth 2 (two) times daily. 60 capsule 2   HUMULIN N 100 UNIT/ML injection Inject 15 Units into the skin 2 (two) times daily.     hydrALAZINE (APRESOLINE) 25 MG tablet Take 25 mg by mouth every 12 (twelve) hours.     isosorbide-hydrALAZINE (BIDIL) 20-37.5 MG tablet Take 1 tablet by  mouth 3 (three) times daily. 90 tablet 3   ketorolac (ACULAR) 0.5 % ophthalmic solution Place 1 drop into the left eye in the morning, at noon, and at bedtime.     labetalol (NORMODYNE) 300 MG tablet Take 300 mg by mouth 2 (two) times daily.     lidocaine (LIDODERM) 5 % Place 1 patch onto the skin daily.     polyethylene glycol (MIRALAX / GLYCOLAX) 17 g packet  Take 17 g by mouth 2 (two) times daily. 14 each 0   prednisoLONE acetate (PRED FORTE) 1 % ophthalmic suspension Place 1 drop into the left eye 3 (three) times daily.     amitriptyline (ELAVIL) 10 MG tablet Take 10 mg by mouth at bedtime. (Patient not taking: Reported on 01/26/2022)     calcitRIOL (ROCALTROL) 0.5 MCG capsule Take 2 capsules (1 mcg total) by mouth every Monday, Wednesday, and Friday with hemodialysis. (Patient not taking: Reported on 01/26/2022) 90 capsule 2   pantoprazole (PROTONIX) 40 MG tablet Take 1 tablet (40 mg total) by mouth 2 (two) times daily. 30 tablet 1   No current facility-administered medications for this visit.   Facility-Administered Medications Ordered in Other Visits  Medication Dose Route Frequency Provider Last Rate Last Admin   0.9 %  sodium chloride infusion   Intravenous Continuous PRN Neldon Newport, CRNA   New Bag at 07/02/19 1304    Allergies as of 01/26/2022 - Review Complete 01/26/2022  Allergen Reaction Noted   Hydrochlorothiazide Itching    Amlodipine  05/12/2019   Duloxetine  05/12/2019   Lisinopril Cough 06/06/2018   Metformin     Codeine Itching 03/10/2008   Dilaudid [hydromorphone hcl] Itching 08/28/2013    Family History  Problem Relation Age of Onset   Hypertension Mother    Alzheimer's disease Father    Throat cancer Sister 12       died at age 29   Diabetes Brother    Colon cancer Neg Hx    Rectal cancer Neg Hx    Stomach cancer Neg Hx     Social History   Socioeconomic History   Marital status: Married    Spouse name: Lanny Hurst    Number of children: 2   Years  of education: Not on file   Highest education level: Not on file  Occupational History   Occupation: disabled  Tobacco Use   Smoking status: Never   Smokeless tobacco: Never  Vaping Use   Vaping Use: Never used  Substance and Sexual Activity   Alcohol use: No   Drug use: No   Sexual activity: Yes    Partners: Male  Other Topics Concern   Not on file  Social History Narrative   Not on file   Social Determinants of Health   Financial Resource Strain: Not on file  Food Insecurity: Not on file  Transportation Needs: Not on file  Physical Activity: Not on file  Stress: Not on file  Social Connections: Not on file  Intimate Partner Violence: Not on file    Review of Systems:    Constitutional: No weight loss, fever or chills Cardiovascular: No chest pain   Respiratory: No SOB  Gastrointestinal: See HPI and otherwise negative   Physical Exam:  Vital signs: BP 132/72   Pulse 89   Ht 5\' 5"  (1.651 m)   Wt 216 lb (98 kg)   BMI 35.94 kg/m    Constitutional:   Pleasant AA female appears to be in NAD, Well developed, Well nourished, alert and cooperative Respiratory: Respirations even and unlabored. Lungs clear to auscultation bilaterally.   No wheezes, crackles, or rhonchi.  Cardiovascular: Normal S1, S2. No MRG. Regular rate and rhythm. No peripheral edema, cyanosis or pallor.  Gastrointestinal:  Soft, nondistended, nontender. No rebound or guarding. Normal bowel sounds. No appreciable masses or hepatomegaly. Rectal:  Not performed.  Psychiatric: Oriented to person, place and time. Demonstrates good judgement and reason without abnormal affect  or behaviors.  RELEVANT LABS AND IMAGING: CBC    Component Value Date/Time   WBC 11.5 (H) 10/20/2021 0837   RBC 3.21 (L) 10/20/2021 0837   HGB 9.6 (L) 10/20/2021 0837   HCT 27.4 (L) 10/20/2021 0837   PLT 228 10/20/2021 0837   MCV 85.4 10/20/2021 0837   MCH 29.9 10/20/2021 0837   MCHC 35.0 10/20/2021 0837   RDW 17.6 (H)  10/20/2021 0837   LYMPHSABS 2.4 04/21/2020 1250   MONOABS 0.6 04/21/2020 1250   EOSABS 0.2 04/21/2020 1250   BASOSABS 0.0 04/21/2020 1250    CMP     Component Value Date/Time   NA 135 10/20/2021 0837   NA 134 08/07/2018 1016   K 3.8 10/20/2021 0837   CL 95 (L) 10/20/2021 0837   CO2 26 10/20/2021 0837   GLUCOSE 210 (H) 10/20/2021 0837   BUN 38 (H) 10/20/2021 0837   BUN 61 (H) 08/07/2018 1016   CREATININE 7.03 (H) 10/20/2021 0837   CALCIUM 9.3 10/20/2021 0837   PROT 6.9 10/19/2021 1327   ALBUMIN 3.3 (L) 10/20/2021 0837   AST 13 (L) 10/19/2021 1327   ALT 13 10/19/2021 1327   ALKPHOS 72 10/19/2021 1327   BILITOT 0.5 10/19/2021 1327   GFRNONAA 6 (L) 10/20/2021 0837   GFRAA 12 (L) 08/18/2019 1726    Assessment: 1.  Epigastric pain: Again recent hospitalization for the same with nausea and vomiting, question of gastritis on CT though no contrast in the stomach to confirm, patient currently on Pantoprazole 40 twice daily and added Pepcid 40 twice daily at last visit, continues epigastric burning and now complaining of some dysphagia; consider gastritis versus functional dyspepsia +/- stricture versus dysmotility 2.  ESRD on HD  Plan: 1.  Continue Pepcid 40 mg twice a day and Pantoprazole 40 mg twice a day 2.  Scheduled patient for barium esophagram with tablet for further evaluation of new dysphagia symptoms.  Pending results could consider EGD in the hospital for further evaluation. 3.  Reviewed anti- dysphagia measures including taking small bites, chewing well, taking sips of water in between bites, avoiding distraction while eating the chin tuck technique. 3.  Patient to follow in clinic per recommendations after imaging above.  Ellouise Newer, PA-C [facility] 01/26/2022, 9:46 AM  Cc: [name], MD

## 2022-01-26 NOTE — Progress Notes (Signed)
____________________________________________________________  Attending physician addendum:  Thank you for sending this case to me. I have reviewed the entire note and agree with the plan.  Suspect some chronic gastrointestinal dysmotility, and that dialysis may be precipitating episodic nausea and vomiting with epigastric pain.  Wilfrid Lund, MD  ____________________________________________________________

## 2022-02-01 ENCOUNTER — Ambulatory Visit (HOSPITAL_COMMUNITY)
Admission: RE | Admit: 2022-02-01 | Discharge: 2022-02-01 | Disposition: A | Discharge status: Home / Self Care | Payer: Medicare Other | Source: Ambulatory Visit | Attending: Physician Assistant | Admitting: Physician Assistant

## 2022-02-01 DIAGNOSIS — R1013 Epigastric pain: Secondary | ICD-10-CM | POA: Insufficient documentation

## 2022-02-01 DIAGNOSIS — R1310 Dysphagia, unspecified: Secondary | ICD-10-CM | POA: Diagnosis present

## 2022-02-01 DIAGNOSIS — K219 Gastro-esophageal reflux disease without esophagitis: Secondary | ICD-10-CM | POA: Insufficient documentation

## 2022-03-07 ENCOUNTER — Ambulatory Visit (INDEPENDENT_AMBULATORY_CARE_PROVIDER_SITE_OTHER): Payer: Medicare HMO | Admitting: Vascular Surgery

## 2022-03-07 ENCOUNTER — Encounter: Payer: Self-pay | Admitting: Vascular Surgery

## 2022-03-07 VITALS — BP 215/109 | HR 82 | Temp 98.2°F | Resp 20 | Ht 65.0 in | Wt 234.0 lb

## 2022-03-07 DIAGNOSIS — N186 End stage renal disease: Secondary | ICD-10-CM | POA: Diagnosis not present

## 2022-03-07 NOTE — Progress Notes (Signed)
Patient ID: [name] [name], female   DOB: May 08, 1960, 61 y.o.   MRN: [id number]  Reason for Consult: Follow-up   Referred by Justin Mend, MD  Subjective:     HPI:  [name] [name] is a 62 y.o. female has a history of end-stage renal disease currently dialyzing via left arm AV fistula Tuesdays, Thursdays and Saturdays.  She does not take any anticoagulants.  She has recently lost weight.  She has a history of hysterectomy which she thinks was done laparoscopically as well as laparoscopic cholecystectomy.  No large abdominal surgeries.  Patient is here to consider peritoneal dialysis catheter placement.  Past Medical History:  Diagnosis Date   Abdominal pain 08/13/2016   Abdominal pain, unspecified site 07/17/2013   Abnormal EKG 08/29/2013   Acute gastric ulcer without hemorrhage or perforation    AKI (acute kidney injury) ([place]) 11/15/2016   Anemia    Anxiety    ARTHRITIS 03/10/2008   Qualifier: Diagnosis of  By: Nolon Rod CMA (AAMA), Robin     Asthma    Atypical chest pain 03/09/2009   Qualifier: Diagnosis of  By: Trellis Paganini PA-c, Amy S    BRONCHITIS, CHRONIC 03/10/2008   Qualifier: Diagnosis of  By: Nolon Rod CMA (AAMA), Robin     Chest pain 08/28/2013   Chronic lower back pain    10-27-13 not a problem at this time   CKD (chronic kidney disease), stage III ([place]) 10/17/2016   CKD (chronic kidney disease), stage V ([place]) 07/02/2019   Closed fracture of coccyx ([place]) 02/17/2019   Closed fracture of phalanx of foot 02/17/2019   Corns and callosities 08/13/2016   Diabetic neuropathy associated with type 2 diabetes mellitus ([place]) 12/25/2015   Diarrhea 07/17/2013   DM (diabetes mellitus), type 2 with neurological complications ([place]) 45/80/9983   Qualifier: Diagnosis of  By: Shane Crutch, Amy S    Dyskinesia of esophagus 03/10/2008   Centricity Description: ESOPHAGEAL SPASM Qualifier: Diagnosis of  By: Nolon Rod CMA ([place]), Robin   Centricity Description: ESOPHAGEAL MOTILITY  DISORDER Qualifier: Diagnosis of  By: Nolon Rod CMA ([place]), Robin     Dysphagia 03/09/2009   Qualifier: Diagnosis of  By: Shane Crutch, Amy S    End stage renal disease ([place]) 02/17/2019   Essential hypertension 03/10/2008   Qualifier: Diagnosis of  By: Nolon Rod CMA (AAMA), Robin     Frequent UTI    Gastritis 08/13/2016   GERD (gastroesophageal reflux disease)    HEPATITIS C, CHRONIC 03/10/2008   Qualifier: Diagnosis of  By: Nolon Rod CMA (AAMA), Robin     High cholesterol    History of hepatitis C 02/17/2019   Hypertensive crisis 10/17/2021   Hypertensive emergency 07/03/2019   Hypertensive renal disease 02/17/2019   Hypertensive urgency 08/28/2013   Ileus ([place]) 10/21/2021   Impingement syndrome of left shoulder region 02/17/2019   Lumbar radiculopathy 07/25/2018   Added automatically from request for surgery [id number]   Microscopic hematuria 10/17/2016   Migraine    "monthly" (08/28/2013)   NAUSEA AND VOMITING 12/20/2008   Qualifier: Diagnosis of  By: Laney Potash, [place] 12/20/2008   Pneumonia    "several times"   Type 2 diabetes mellitus ([place]) 07/02/2019   Family History  Problem Relation Age of Onset   Hypertension Mother    Alzheimer's disease Father    Throat cancer Sister 35       died at age 54   Diabetes Brother    Colon cancer Neg Hx  Rectal cancer Neg Hx    Stomach cancer Neg Hx    Past Surgical History:  Procedure Laterality Date   ABDOMINAL HYSTERECTOMY     APPENDECTOMY     AV FISTULA PLACEMENT Left 05/19/2019   Procedure: LEFT ARM Brachiocephalic ARTERIOVENOUS (AV) FISTULA CREATION;  Surgeon: Waynetta Sandy, MD;  Location: [facility];  Service: Vascular;  Laterality: Left;   AV FISTULA PLACEMENT Left 07/23/2019   Procedure: LEFT ARM ARTERIOVENOUS (AV) FISTULA CREATION;  Surgeon: Waynetta Sandy, MD;  Location: [facility];  Service: Vascular;  Laterality: Left;   BALLOON DILATION N/A 12/29/2012   Procedure: Larrie Kass  DILATION;  Surgeon: Inda Castle, MD;  [facility];  Service: Endoscopy;  Laterality: N/A;   BALLOON DILATION N/A 06/25/2013   Procedure: BALLOON DILATION;  Surgeon: Inda Castle, MD;  [facility];  Service: Endoscopy;  Laterality: N/A;   BASCILIC VEIN TRANSPOSITION Left 09/24/2019   Procedure: SECOND STAGE LEFT BASCILIC VEIN TRANSPOSITION;  Surgeon: Waynetta Sandy, MD;  Location: [facility];  Service: Vascular;  Laterality: Left;   BOTOX INJECTION  12/29/2012   Procedure: BOTOX INJECTION;  Surgeon: Inda Castle, MD;  [facility];  Service: Endoscopy;;   BOTOX INJECTION N/A 06/25/2013   Procedure: BOTOX INJECTION;  Surgeon: Inda Castle, MD;  [facility];  Service: Endoscopy;  Laterality: N/A;   CARDIAC CATHETERIZATION  X 2   /notes 08/28/2013   CARPAL TUNNEL RELEASE Right    CHOLECYSTECTOMY     COLONOSCOPY  04/10/2012   Normal    ESOPHAGEAL MANOMETRY N/A 12/26/2015   Procedure: ESOPHAGEAL MANOMETRY (EM);  Surgeon: Doran Stabler, MD;  [facility];  Service: Gastroenterology;  Laterality: N/A;   ESOPHAGOGASTRODUODENOSCOPY N/A 12/29/2012   Procedure: ESOPHAGOGASTRODUODENOSCOPY (EGD);  Surgeon: Inda Castle, MD;  [facility];  Service: Endoscopy;  Laterality: N/A;   ESOPHAGOGASTRODUODENOSCOPY N/A 06/25/2013   Procedure: ESOPHAGOGASTRODUODENOSCOPY (EGD);  Surgeon: Inda Castle, MD;  Location[facility];  Service: Endoscopy;  Laterality: N/A;   ESOPHAGOGASTRODUODENOSCOPY N/A 11/03/2013   Procedure: ESOPHAGOGASTRODUODENOSCOPY (EGD);  Surgeon: Inda Castle, MD;  Location: [facility];  Service: Endoscopy;  Laterality: N/A;  Botox - ballon dilation   ESOPHAGOGASTRODUODENOSCOPY N/A 08/14/2016   Procedure: ESOPHAGOGASTRODUODENOSCOPY (EGD);  Surgeon: Milus Banister, MD;  Location: [facility];  Service: Endoscopy;  Laterality: N/A;   FOOT FRACTURE SURGERY Right    HEEL SPUR EXCISION Right    TONSILLECTOMY      TUBAL LIGATION      Short Social History:  Social History   Tobacco Use   Smoking status: Never   Smokeless tobacco: Never  Substance Use Topics   Alcohol use: No    Allergies  Allergen Reactions   Hydrochlorothiazide Itching   Amlodipine     Foot swelling/chest pain    Duloxetine     Hallucinations    Lisinopril Cough   Metformin     Unknown reaction   Codeine Itching    No hives or rash, takes benadryl   Dilaudid [Hydromorphone Hcl] Itching    "mild itching hrs after dilaudid given"    Current Outpatient Medications  Medication Sig Dispense Refill   acetaminophen (TYLENOL) 500 MG tablet Take 1,000 mg by mouth 2 (two) times daily.     albuterol (VENTOLIN HFA) 108 (90 Base) MCG/ACT inhaler Inhale 2 puffs into the lungs every 4 (four) hours as needed for wheezing or shortness of breath.      ALPRAZolam (XANAX) 0.25  MG tablet Take 0.25 mg by mouth 3 (three) times daily as needed for anxiety.     cloNIDine (CATAPRES) 0.2 MG tablet Take 1 tablet (0.2 mg total) by mouth every 8 (eight) hours. 90 tablet 2   cyclobenzaprine (FLEXERIL) 10 MG tablet Take 10 mg by mouth 2 (two) times daily as needed for muscle spasms.     famotidine (PEPCID) 40 MG tablet Take 1 tablet (40 mg total) by mouth 2 (two) times daily. 60 tablet 3   fluticasone (FLONASE) 50 MCG/ACT nasal spray Place 2 sprays into both nostrils daily as needed for allergies or rhinitis.     furosemide (LASIX) 80 MG tablet Take 80 mg by mouth 2 (two) times daily.      gabapentin (NEURONTIN) 300 MG capsule Take 1 capsule (300 mg total) by mouth 2 (two) times daily. 60 capsule 2   HUMULIN N 100 UNIT/ML injection Inject 15 Units into the skin 2 (two) times daily.     hydrALAZINE (APRESOLINE) 25 MG tablet Take 25 mg by mouth every 12 (twelve) hours.     isosorbide-hydrALAZINE (BIDIL) 20-37.5 MG tablet Take 1 tablet by mouth 3 (three) times daily. 90 tablet 3   ketorolac (ACULAR) 0.5 % ophthalmic solution Place 1 drop into the  left eye in the morning, at noon, and at bedtime.     labetalol (NORMODYNE) 300 MG tablet Take 300 mg by mouth 2 (two) times daily.     lidocaine (LIDODERM) 5 % Place 1 patch onto the skin daily.     polyethylene glycol (MIRALAX / GLYCOLAX) 17 g packet Take 17 g by mouth 2 (two) times daily. 14 each 0   prednisoLONE acetate (PRED FORTE) 1 % ophthalmic suspension Place 1 drop into the left eye 3 (three) times daily.     amitriptyline (ELAVIL) 10 MG tablet Take 10 mg by mouth at bedtime. (Patient not taking: Reported on 01/26/2022)     calcitRIOL (ROCALTROL) 0.5 MCG capsule Take 2 capsules (1 mcg total) by mouth every Monday, Wednesday, and Friday with hemodialysis. (Patient not taking: Reported on 01/26/2022) 90 capsule 2   pantoprazole (PROTONIX) 40 MG tablet Take 1 tablet (40 mg total) by mouth 2 (two) times daily. 30 tablet 1   No current facility-administered medications for this visit.   Facility-Administered Medications Ordered in Other Visits  Medication Dose Route Frequency Provider Last Rate Last Admin   0.9 %  sodium chloride infusion   Intravenous Continuous PRN Neldon Newport, CRNA   New Bag at 07/02/19 1304    Review of Systems  Constitutional:  Constitutional negative. HENT: HENT negative.  Eyes: Eyes negative.  Respiratory: Respiratory negative.  Cardiovascular: Cardiovascular negative.  GI: Gastrointestinal negative.  Musculoskeletal: Musculoskeletal negative.  Skin: Skin negative.  Neurological: Neurological negative. Hematologic: Hematologic/lymphatic negative.  Psychiatric: Psychiatric negative.        Objective:  Objective   Vitals:   03/07/22 0918  BP: (!) 215/109  Pulse: 82  Resp: 20  Temp: 98.2 F (36.8 C)  SpO2: 95%  Weight: 234 lb (106.1 kg)  Height: 5\' 5"  (1.651 m)   Body mass index is 38.94 kg/m.  Physical Exam HENT:     Head: Normocephalic.     Nose: Nose normal.     Mouth/Throat:     Mouth: Mucous membranes are moist.  Eyes:      Pupils: Pupils are equal, round, and reactive to light.  Cardiovascular:     Rate and Rhythm: Normal rate.  Pulmonary:  Effort: Pulmonary effort is normal.  Abdominal:     General: Abdomen is flat. There is no distension.     Palpations: Abdomen is soft. There is no mass.  Musculoskeletal:        General: Normal range of motion.     Cervical back: Normal range of motion and neck supple.     Right lower leg: No edema.     Left lower leg: No edema.     Comments: Left arm strong thrill  Skin:    General: Skin is warm.     Capillary Refill: Capillary refill takes less than 2 seconds.  Neurological:     General: No focal deficit present.     Mental Status: She is alert.  Psychiatric:        Mood and Affect: Mood normal.        Behavior: Behavior normal.        Thought Content: Thought content normal.        Judgment: Judgment normal.     Data: No studies     Assessment/Plan:    62 year old female with end-stage renal disease currently on dialysis via a two-stage basilic vein fistula which is working well.  She is here to discuss peritoneal dialysis.  She does have a history of laparoscopic abdominal surgery including hysterectomy and cholecystectomy.  She does not have any significant scars on her abdomen other than the laparoscopic approaches.  We discussed the high risk nature of placing peritoneal dialysis catheter with abdominal adhesions and she demonstrates good understanding.  I also discussed the patient's weight is a concerning factor and the risk of primary nonfunction of the catheter which would require additional surgery to remove.  She demonstrates good understanding and would like to get peritoneal dialysis and attempt we will get her scheduled on a nondialysis day in the near future.     Waynetta Sandy MD [facility]

## 2022-03-07 NOTE — H&P (View-Only) (Signed)
Patient ID: [name] [name], female   DOB: [birth year], 61 y.o.   MRN: [id number]  Reason for Consult: Follow-up   Referred by Justin Mend, MD  Subjective:     HPI:  [name] [name] is a 62 y.o. female has a history of end-stage renal disease currently dialyzing via left arm AV fistula Tuesdays, Thursdays and Saturdays.  She does not take any anticoagulants.  She has recently lost weight.  She has a history of hysterectomy which she thinks was done laparoscopically as well as laparoscopic cholecystectomy.  No large abdominal surgeries.  Patient is here to consider peritoneal dialysis catheter placement.  Past Medical History:  Diagnosis Date   Abdominal pain 08/13/2016   Abdominal pain, unspecified site 07/17/2013   Abnormal EKG 08/29/2013   Acute gastric ulcer without hemorrhage or perforation    AKI (acute kidney injury) ([place]) 11/15/2016   Anemia    Anxiety    ARTHRITIS 03/10/2008   Qualifier: Diagnosis of  By: Nolon Rod CMA (AAMA), [place]     Asthma    Atypical chest pain 03/09/2009   Qualifier: Diagnosis of  By: Trellis Paganini PA-c, Amy S    BRONCHITIS, CHRONIC 03/10/2008   Qualifier: Diagnosis of  By: Nolon Rod CMA (AAMA), Robin     Chest pain 08/28/2013   Chronic lower back pain    10-27-13 not a problem at this time   CKD (chronic kidney disease), stage III ([place]) 10/17/2016   CKD (chronic kidney disease), stage V ([place]) 07/02/2019   Closed fracture of coccyx ([place]) 02/17/2019   Closed fracture of phalanx of foot 02/17/2019   Corns and callosities 08/13/2016   Diabetic neuropathy associated with type 2 diabetes mellitus ([place]) 12/25/2015   Diarrhea 07/17/2013   DM (diabetes mellitus), type 2 with neurological complications ([place]) 57/02/7791   Qualifier: Diagnosis of  By: Shane Crutch, Amy S    Dyskinesia of esophagus 03/10/2008   Centricity Description: ESOPHAGEAL SPASM Qualifier: Diagnosis of  By: Nolon Rod CMA ([place]), Robin   Centricity Description: ESOPHAGEAL MOTILITY  DISORDER Qualifier: Diagnosis of  By: Nolon Rod CMA ([place]), Robin     Dysphagia 03/09/2009   Qualifier: Diagnosis of  By: Shane Crutch, Amy S    End stage renal disease ([place]) 02/17/2019   Essential hypertension 03/10/2008   Qualifier: Diagnosis of  By: Nolon Rod CMA (AAMA), Robin     Frequent UTI    Gastritis 08/13/2016   GERD (gastroesophageal reflux disease)    HEPATITIS C, CHRONIC 03/10/2008   Qualifier: Diagnosis of  By: Nolon Rod CMA (AAMA), Robin     High cholesterol    History of hepatitis C 02/17/2019   Hypertensive crisis 10/17/2021   Hypertensive emergency 07/03/2019   Hypertensive renal disease 02/17/2019   Hypertensive urgency 08/28/2013   Ileus ([place]) 10/21/2021   Impingement syndrome of left shoulder region 02/17/2019   Lumbar radiculopathy 07/25/2018   Added automatically from request for surgery [id number]   Microscopic hematuria 10/17/2016   Migraine    "monthly" (08/28/2013)   NAUSEA AND VOMITING 12/20/2008   Qualifier: Diagnosis of  By: Laney Potash, [place] 12/20/2008   Pneumonia    "several times"   Type 2 diabetes mellitus ([place]) 07/02/2019   Family History  Problem Relation Age of Onset   Hypertension Mother    Alzheimer's disease Father    Throat cancer Sister 27       died at age 61   Diabetes Brother    Colon cancer Neg Hx  Rectal cancer Neg Hx    Stomach cancer Neg Hx    Past Surgical History:  Procedure Laterality Date   ABDOMINAL HYSTERECTOMY     APPENDECTOMY     AV FISTULA PLACEMENT Left 05/19/2019   Procedure: LEFT ARM Brachiocephalic ARTERIOVENOUS (AV) FISTULA CREATION;  Surgeon: Waynetta Sandy, MD;  Location: [place];  Service: Vascular;  Laterality: Left;   AV FISTULA PLACEMENT Left 07/23/2019   Procedure: LEFT ARM ARTERIOVENOUS (AV) FISTULA CREATION;  Surgeon: Waynetta Sandy, MD;  Location: [place];  Service: Vascular;  Laterality: Left;   BALLOON DILATION N/A 12/29/2012   Procedure: Larrie Kass  DILATION;  Surgeon: Inda Castle, MD;  [facility];  Service: Endoscopy;  Laterality: N/A;   BALLOON DILATION N/A 06/25/2013   Procedure: BALLOON DILATION;  Surgeon: Inda Castle, MD;  [facility];  Service: Endoscopy;  Laterality: N/A;   BASCILIC VEIN TRANSPOSITION Left 09/24/2019   Procedure: SECOND STAGE LEFT BASCILIC VEIN TRANSPOSITION;  Surgeon: Waynetta Sandy, MD;  Location: [place];  Service: Vascular;  Laterality: Left;   BOTOX INJECTION  12/29/2012   Procedure: BOTOX INJECTION;  Surgeon: Inda Castle, MD;  [facility];  Service: Endoscopy;;   BOTOX INJECTION N/A 06/25/2013   Procedure: BOTOX INJECTION;  Surgeon: Inda Castle, MD;  [facility];  Service: Endoscopy;  Laterality: N/A;   CARDIAC CATHETERIZATION  X 2   /notes 08/28/2013   CARPAL TUNNEL RELEASE Right    CHOLECYSTECTOMY     COLONOSCOPY  04/10/2012   Normal    ESOPHAGEAL MANOMETRY N/A 12/26/2015   Procedure: ESOPHAGEAL MANOMETRY (EM);  Surgeon: Doran Stabler, MD;  [facility];  Service: Gastroenterology;  Laterality: N/A;   ESOPHAGOGASTRODUODENOSCOPY N/A 12/29/2012   Procedure: ESOPHAGOGASTRODUODENOSCOPY (EGD);  Surgeon: Inda Castle, MD;  Location[facility];  Service: Endoscopy;  Laterality: N/A;   ESOPHAGOGASTRODUODENOSCOPY N/A 06/25/2013   Procedure: ESOPHAGOGASTRODUODENOSCOPY (EGD);  Surgeon: Inda Castle, MD;  Location: [facility];  Service: Endoscopy;  Laterality: N/A;   ESOPHAGOGASTRODUODENOSCOPY N/A 11/03/2013   Procedure: ESOPHAGOGASTRODUODENOSCOPY (EGD);  Surgeon: Inda Castle, MD;  Location: [facility];  Service: Endoscopy;  Laterality: N/A;  Botox - ballon dilation   ESOPHAGOGASTRODUODENOSCOPY N/A 08/14/2016   Procedure: ESOPHAGOGASTRODUODENOSCOPY (EGD);  Surgeon: Milus Banister, MD;  [facility];  Service: Endoscopy;  Laterality: N/A;   FOOT FRACTURE SURGERY Right    HEEL SPUR EXCISION Right    TONSILLECTOMY      TUBAL LIGATION      Short Social History:  Social History   Tobacco Use   Smoking status: Never   Smokeless tobacco: Never  Substance Use Topics   Alcohol use: No    Allergies  Allergen Reactions   Hydrochlorothiazide Itching   Amlodipine     Foot swelling/chest pain    Duloxetine     Hallucinations    Lisinopril Cough   Metformin     Unknown reaction   Codeine Itching    No hives or rash, takes benadryl   Dilaudid [Hydromorphone Hcl] Itching    "mild itching hrs after dilaudid given"    Current Outpatient Medications  Medication Sig Dispense Refill   acetaminophen (TYLENOL) 500 MG tablet Take 1,000 mg by mouth 2 (two) times daily.     albuterol (VENTOLIN HFA) 108 (90 Base) MCG/ACT inhaler Inhale 2 puffs into the lungs every 4 (four) hours as needed for wheezing or shortness of breath.      ALPRAZolam (XANAX) 0.25  MG tablet Take 0.25 mg by mouth 3 (three) times daily as needed for anxiety.     cloNIDine (CATAPRES) 0.2 MG tablet Take 1 tablet (0.2 mg total) by mouth every 8 (eight) hours. 90 tablet 2   cyclobenzaprine (FLEXERIL) 10 MG tablet Take 10 mg by mouth 2 (two) times daily as needed for muscle spasms.     famotidine (PEPCID) 40 MG tablet Take 1 tablet (40 mg total) by mouth 2 (two) times daily. 60 tablet 3   fluticasone (FLONASE) 50 MCG/ACT nasal spray Place 2 sprays into both nostrils daily as needed for allergies or rhinitis.     furosemide (LASIX) 80 MG tablet Take 80 mg by mouth 2 (two) times daily.      gabapentin (NEURONTIN) 300 MG capsule Take 1 capsule (300 mg total) by mouth 2 (two) times daily. 60 capsule 2   HUMULIN N 100 UNIT/ML injection Inject 15 Units into the skin 2 (two) times daily.     hydrALAZINE (APRESOLINE) 25 MG tablet Take 25 mg by mouth every 12 (twelve) hours.     isosorbide-hydrALAZINE (BIDIL) 20-37.5 MG tablet Take 1 tablet by mouth 3 (three) times daily. 90 tablet 3   ketorolac (ACULAR) 0.5 % ophthalmic solution Place 1 drop into the  left eye in the morning, at noon, and at bedtime.     labetalol (NORMODYNE) 300 MG tablet Take 300 mg by mouth 2 (two) times daily.     lidocaine (LIDODERM) 5 % Place 1 patch onto the skin daily.     polyethylene glycol (MIRALAX / GLYCOLAX) 17 g packet Take 17 g by mouth 2 (two) times daily. 14 each 0   prednisoLONE acetate (PRED FORTE) 1 % ophthalmic suspension Place 1 drop into the left eye 3 (three) times daily.     amitriptyline (ELAVIL) 10 MG tablet Take 10 mg by mouth at bedtime. (Patient not taking: Reported on 01/26/2022)     calcitRIOL (ROCALTROL) 0.5 MCG capsule Take 2 capsules (1 mcg total) by mouth every Monday, Wednesday, and Friday with hemodialysis. (Patient not taking: Reported on 01/26/2022) 90 capsule 2   pantoprazole (PROTONIX) 40 MG tablet Take 1 tablet (40 mg total) by mouth 2 (two) times daily. 30 tablet 1   No current facility-administered medications for this visit.   Facility-Administered Medications Ordered in Other Visits  Medication Dose Route Frequency Provider Last Rate Last Admin   0.9 %  sodium chloride infusion   Intravenous Continuous PRN Neldon Newport, CRNA   New Bag at 07/02/19 1304    Review of Systems  Constitutional:  Constitutional negative. HENT: HENT negative.  Eyes: Eyes negative.  Respiratory: Respiratory negative.  Cardiovascular: Cardiovascular negative.  GI: Gastrointestinal negative.  Musculoskeletal: Musculoskeletal negative.  Skin: Skin negative.  Neurological: Neurological negative. Hematologic: Hematologic/lymphatic negative.  Psychiatric: Psychiatric negative.        Objective:  Objective   Vitals:   03/07/22 0918  BP: (!) 215/109  Pulse: 82  Resp: 20  Temp: 98.2 F (36.8 C)  SpO2: 95%  Weight: 234 lb (106.1 kg)  Height: 5\' 5"  (1.651 m)   Body mass index is 38.94 kg/m.  Physical Exam HENT:     Head: Normocephalic.     Nose: Nose normal.     Mouth/Throat:     Mouth: Mucous membranes are moist.  Eyes:      Pupils: Pupils are equal, round, and reactive to light.  Cardiovascular:     Rate and Rhythm: Normal rate.  Pulmonary:  Effort: Pulmonary effort is normal.  Abdominal:     General: Abdomen is flat. There is no distension.     Palpations: Abdomen is soft. There is no mass.  Musculoskeletal:        General: Normal range of motion.     Cervical back: Normal range of motion and neck supple.     Right lower leg: No edema.     Left lower leg: No edema.     Comments: Left arm strong thrill  Skin:    General: Skin is warm.     Capillary Refill: Capillary refill takes less than 2 seconds.  Neurological:     General: No focal deficit present.     Mental Status: She is alert.  Psychiatric:        Mood and Affect: Mood normal.        Behavior: Behavior normal.        Thought Content: Thought content normal.        Judgment: Judgment normal.     Data: No studies     Assessment/Plan:    62 year old female with end-stage renal disease currently on dialysis via a two-stage basilic vein fistula which is working well.  She is here to discuss peritoneal dialysis.  She does have a history of laparoscopic abdominal surgery including hysterectomy and cholecystectomy.  She does not have any significant scars on her abdomen other than the laparoscopic approaches.  We discussed the high risk nature of placing peritoneal dialysis catheter with abdominal adhesions and she demonstrates good understanding.  I also discussed the patient's weight is a concerning factor and the risk of primary nonfunction of the catheter which would require additional surgery to remove.  She demonstrates good understanding and would like to get peritoneal dialysis and attempt we will get her scheduled on a nondialysis day in the near future.     Waynetta Sandy MD [facility]

## 2022-03-09 ENCOUNTER — Other Ambulatory Visit: Payer: Self-pay

## 2022-03-09 DIAGNOSIS — N186 End stage renal disease: Secondary | ICD-10-CM

## 2022-03-19 NOTE — Anesthesia Preprocedure Evaluation (Addendum)
Anesthesia Evaluation  Patient identified by MRN, date of birth, ID band Patient awake    Reviewed: Allergy & Precautions, NPO status , Patient's Chart, lab work & pertinent test results  History of Anesthesia Complications Negative for: history of anesthetic complications  Airway Mallampati: II  TM Distance: >3 FB Neck ROM: Full    Dental no notable dental hx. (+) Dental Advisory Given   Pulmonary asthma    Pulmonary exam normal        Cardiovascular hypertension, Pt. on medications Normal cardiovascular exam  IMPRESSIONS     1. Left ventricular ejection fraction, by estimation, is 60 to 65%. The  left ventricle has normal function. The left ventricle has no regional  wall motion abnormalities. There is moderate concentric left ventricular  hypertrophy. Left ventricular  diastolic parameters are consistent with Grade I diastolic dysfunction  (impaired relaxation). Elevated left atrial pressure.   2. Right ventricular systolic function is normal. The right ventricular  size is normal.   3. Left atrial size was mildly dilated.   4. The mitral valve is normal in structure. Trivial mitral valve  regurgitation. No evidence of mitral stenosis.   5. The aortic valve is tricuspid. Aortic valve regurgitation is trivial.  Aortic valve sclerosis is present, with no evidence of aortic valve  stenosis.      Neuro/Psych  Headaches PSYCHIATRIC DISORDERS Anxiety Depression       GI/Hepatic PUD,GERD  ,,  Endo/Other  diabetes    Renal/GU Renal disease     Musculoskeletal  (+) Arthritis ,    Abdominal   Peds  Hematology   Anesthesia Other Findings   Reproductive/Obstetrics                             Anesthesia Physical Anesthesia Plan  ASA: 3  Anesthesia Plan: General   Post-op Pain Management: Tylenol PO (pre-op)*   Induction: Intravenous  PONV Risk Score and Plan: 4 or greater and  Ondansetron, Dexamethasone, Midazolam and Scopolamine patch - Pre-op  Airway Management Planned: Oral ETT  Additional Equipment:   Intra-op Plan:   Post-operative Plan: Extubation in OR  Informed Consent: I have reviewed the patients History and Physical, chart, labs and discussed the procedure including the risks, benefits and alternatives for the proposed anesthesia with the patient or authorized representative who has indicated his/her understanding and acceptance.     Dental advisory given  Plan Discussed with: Anesthesiologist and CRNA  Anesthesia Plan Comments: (PAT note by Karoline Caldwell, PA-C: 62 year old female with medical history significant for ESRD on HD Monday Wednesday Friday, hep C treated with Harvoni in 2018, HTN, HLD, IDDM 2 (A1c 8.2 on 10/17/2021).  Recent admission 9/5 through 10/21/2021 for hypertensive crisis.  She underwent hemodialysis on 9/5 and 9/6.  Hydralazine and Imdur changed to BiDil and clonidine was increased to 3 times daily.  He was maintained on labetalol.  She was noted to have a mild elevation of troponin, cardiology was consulted, echo showed normal EF, no further workup planned by cardiology.  She did follow-up outpatient with cardiologist Dr. Geraldo Pitter on 11/17/2021.  Blood pressure reasonably well-controlled at that time, 156/60.  Diet and medication complication were stressed.  1 year follow-up recommended.  Patient will need day of surgery labs and evaluation.  EKG 11/17/2021: NSR.  Rate 75.  T wave abnormality, consider inferolateral ischemia.  Prolonged QT (QTc 480).  No significant change.  TTE 10/19/2021: 1. Left ventricular ejection fraction,  by estimation, is 60 to 65%. The  left ventricle has normal function. The left ventricle has no regional  wall motion abnormalities. There is moderate concentric left ventricular  hypertrophy. Left ventricular  diastolic parameters are consistent with Grade I diastolic dysfunction  (impaired relaxation).  Elevated left atrial pressure.  2. Right ventricular systolic function is normal. The right ventricular  size is normal.  3. Left atrial size was mildly dilated.  4. The mitral valve is normal in structure. Trivial mitral valve  regurgitation. No evidence of mitral stenosis.  5. The aortic valve is tricuspid. Aortic valve regurgitation is trivial.  Aortic valve sclerosis is present, with no evidence of aortic valve  stenosis.   Nuclear stress 07/15/2018: ? Nuclear stress EF: 59%. ? The left ventricular ejection fraction is normal (55-65%). ? Blood pressure demonstrated a normal response to exercise. ? There was no ST segment deviation noted during stress. ? This is a low risk study. ? No ischemia, no MI, normal EF.   )        Anesthesia Quick Evaluation

## 2022-03-19 NOTE — Progress Notes (Signed)
Anesthesia Chart Review: Same day workup  62 year old female with medical history significant for ESRD on HD Monday Wednesday Friday, hep C treated with Harvoni in 2018, HTN, HLD, IDDM 2 (A1c 8.2 on 10/17/2021).  Recent admission 9/5 through 10/21/2021 for hypertensive crisis.  She underwent hemodialysis on 9/5 and 9/6.  Hydralazine and Imdur changed to BiDil and clonidine was increased to 3 times daily.  He was maintained on labetalol.  She was noted to have a mild elevation of troponin, cardiology was consulted, echo showed normal EF, no further workup planned by cardiology.  She did follow-up outpatient with cardiologist Dr. Geraldo Pitter on 11/17/2021.  Blood pressure reasonably well-controlled at that time, 156/60.  Diet and medication complication were stressed.  1 year follow-up recommended.  Patient will need day of surgery labs and evaluation.  EKG 11/17/2021: NSR.  Rate 75.  T wave abnormality, consider inferolateral ischemia.  Prolonged QT (QTc 480).  No significant change.  TTE 10/19/2021:  1. Left ventricular ejection fraction, by estimation, is 60 to 65%. The  left ventricle has normal function. The left ventricle has no regional  wall motion abnormalities. There is moderate concentric left ventricular  hypertrophy. Left ventricular  diastolic parameters are consistent with Grade I diastolic dysfunction  (impaired relaxation). Elevated left atrial pressure.   2. Right ventricular systolic function is normal. The right ventricular  size is normal.   3. Left atrial size was mildly dilated.   4. The mitral valve is normal in structure. Trivial mitral valve  regurgitation. No evidence of mitral stenosis.   5. The aortic valve is tricuspid. Aortic valve regurgitation is trivial.  Aortic valve sclerosis is present, with no evidence of aortic valve  stenosis.   Nuclear stress 07/15/2018: Nuclear stress EF: 59%. The left ventricular ejection fraction is normal (55-65%). Blood pressure demonstrated  a normal response to exercise. There was no ST segment deviation noted during stress. This is a low risk study. No ischemia, no MI, normal EF.   Wynonia Musty [facility] Short Stay Center/Anesthesiology Phone [phone] 03/19/2022 11:16 AM

## 2022-03-19 NOTE — Progress Notes (Signed)
INSTRUCTIONS ONLY per pt's request. She was at dialysis  Anesthesia review: Y  Patient verbally denies any shortness of breath, fever, cough and chest pain during phone call   -------------  SDW INSTRUCTIONS given:  Your procedure is scheduled on 03/20/22.  Report to [facility] Main Entrance "A" at 0530 A.M., and check in at the Admitting office.  Call this number if you have problems the morning of surgery:  [phone]   Remember:  Do not eat or drink after midnight the night before your surgery     Take these medicines the morning of surgery with A SIP OF WATER  cloNIDine (CATAPRES)  cyclobenzaprine (FLEXERIL)  famotidine (PEPCID)  labetalol (NORMODYNE)  isosorbide-hydrALAZINE (BIDIL)  acetaminophen (TYLENOL)-if needed albuterol (VENTOLIN HFA)-if needed (Please bring on the day of surgery) fluticasone (FLONASE)-if needed gabapentin (NEURONTIN)-if needed  **HUMULIN ** take ONLY 50% of your normal dose at bed time and morning or surgery  ** PLEASE check your blood sugar the morning of your surgery when you wake up and every 2 hours until you get to the Short Stay unit.  If your blood sugar is less than 70 mg/dL, you will need to treat for low blood sugar: Do not take insulin. Treat a low blood sugar (less than 70 mg/dL) with  cup of clear juice (cranberry or apple), 4 glucose tablets, OR glucose gel. Recheck blood sugar in 15 minutes after treatment (to make sure it is greater than 70 mg/dL). If your blood sugar is not greater than 70 mg/dL on recheck, call [phone] for further instructions.  As of today, STOP taking any Aspirin (unless otherwise instructed by your surgeon) Aleve, Naproxen, Ibuprofen, Motrin, Advil, Goody's, BC's, all herbal medications, fish oil, and all vitamins.                      Do not wear jewelry, make up, or nail polish            Do not wear lotions, powders, perfumes/colognes, or deodorant.            Do not shave 48 hours prior to  surgery.  Men may shave face and neck.            Do not bring valuables to the hospital.            [facility] is not responsible for any belongings or valuables.  Do NOT Smoke (Tobacco/Vaping) 24 hours prior to your procedure If you use a CPAP at night, you may bring all equipment for your overnight stay.   Contacts, glasses, dentures or bridgework may not be worn into surgery.      For patients admitted to the hospital, discharge time will be determined by your treatment team.   Patients discharged the day of surgery will not be allowed to drive home, and someone needs to stay with them for 24 hours.    Special instructions:   [place]- Preparing For Surgery  Before surgery, you can play an important role. Because skin is not sterile, your skin needs to be as free of germs as possible. You can reduce the number of germs on your skin by washing with CHG (chlorahexidine gluconate) Soap before surgery.  CHG is an antiseptic cleaner which kills germs and bonds with the skin to continue killing germs even after washing.    Oral Hygiene is also important to reduce your risk of infection.  Remember - BRUSH YOUR TEETH THE MORNING OF SURGERY WITH  YOUR REGULAR TOOTHPASTE  Please do not use if you have an allergy to CHG or antibacterial soaps. If your skin becomes reddened/irritated stop using the CHG.  Do not shave (including legs and underarms) for at least 48 hours prior to first CHG shower. It is OK to shave your face.  Please follow these instructions carefully.   Shower the NIGHT BEFORE SURGERY and the MORNING OF SURGERY with DIAL Soap.   Pat yourself dry with a CLEAN TOWEL.  Wear CLEAN PAJAMAS to bed the night before surgery  Place CLEAN SHEETS on your bed the night of your first shower and DO NOT SLEEP WITH PETS.   Day of Surgery: Please shower morning of surgery  Wear Clean/Comfortable clothing the morning of surgery Do not apply any deodorants/lotions.   Remember to  brush your teeth WITH YOUR REGULAR TOOTHPASTE.   Questions were answered. Patient verbalized understanding of instructions.

## 2022-03-20 ENCOUNTER — Ambulatory Visit (HOSPITAL_COMMUNITY): Payer: 59 | Admitting: Physician Assistant

## 2022-03-20 ENCOUNTER — Encounter (HOSPITAL_COMMUNITY)
Admission: RE | Disposition: A | Discharge status: Home / Self Care | Payer: Self-pay | Source: Home / Self Care | Attending: Vascular Surgery

## 2022-03-20 ENCOUNTER — Other Ambulatory Visit: Payer: Self-pay

## 2022-03-20 ENCOUNTER — Ambulatory Visit (HOSPITAL_COMMUNITY)
Admission: RE | Admit: 2022-03-20 | Discharge: 2022-03-20 | Disposition: A | Discharge status: Home / Self Care | Payer: 59 | Attending: Vascular Surgery | Admitting: Vascular Surgery

## 2022-03-20 ENCOUNTER — Ambulatory Visit (HOSPITAL_BASED_OUTPATIENT_CLINIC_OR_DEPARTMENT_OTHER): Payer: 59 | Admitting: Physician Assistant

## 2022-03-20 ENCOUNTER — Encounter (HOSPITAL_COMMUNITY): Payer: Self-pay | Admitting: Vascular Surgery

## 2022-03-20 DIAGNOSIS — Z992 Dependence on renal dialysis: Secondary | ICD-10-CM | POA: Insufficient documentation

## 2022-03-20 DIAGNOSIS — E1122 Type 2 diabetes mellitus with diabetic chronic kidney disease: Secondary | ICD-10-CM

## 2022-03-20 DIAGNOSIS — I120 Hypertensive chronic kidney disease with stage 5 chronic kidney disease or end stage renal disease: Secondary | ICD-10-CM

## 2022-03-20 DIAGNOSIS — Z90710 Acquired absence of both cervix and uterus: Secondary | ICD-10-CM | POA: Diagnosis not present

## 2022-03-20 DIAGNOSIS — F418 Other specified anxiety disorders: Secondary | ICD-10-CM | POA: Diagnosis not present

## 2022-03-20 DIAGNOSIS — K219 Gastro-esophageal reflux disease without esophagitis: Secondary | ICD-10-CM | POA: Insufficient documentation

## 2022-03-20 DIAGNOSIS — Z9049 Acquired absence of other specified parts of digestive tract: Secondary | ICD-10-CM | POA: Diagnosis not present

## 2022-03-20 DIAGNOSIS — N186 End stage renal disease: Secondary | ICD-10-CM

## 2022-03-20 DIAGNOSIS — N185 Chronic kidney disease, stage 5: Secondary | ICD-10-CM | POA: Diagnosis not present

## 2022-03-20 DIAGNOSIS — Z8711 Personal history of peptic ulcer disease: Secondary | ICD-10-CM | POA: Insufficient documentation

## 2022-03-20 HISTORY — PX: CAPD INSERTION: SHX5233

## 2022-03-20 LAB — GLUCOSE, CAPILLARY
Glucose-Capillary: 56 mg/dL — ABNORMAL LOW (ref 70–99)
Glucose-Capillary: 71 mg/dL (ref 70–99)
Glucose-Capillary: 85 mg/dL (ref 70–99)
Glucose-Capillary: 41 mg/dL — CL (ref 70–99)
Glucose-Capillary: 55 mg/dL — ABNORMAL LOW (ref 70–99)
Glucose-Capillary: 46 mg/dL — ABNORMAL LOW (ref 70–99)
Glucose-Capillary: 80 mg/dL (ref 70–99)
Glucose-Capillary: 91 mg/dL (ref 70–99)

## 2022-03-20 LAB — POCT I-STAT, CHEM 8
BUN: 37 mg/dL — ABNORMAL HIGH (ref 6–20)
Calcium, Ion: 0.98 mmol/L — ABNORMAL LOW (ref 1.15–1.40)
Chloride: 90 mmol/L — ABNORMAL LOW (ref 98–111)
Creatinine, Ser: 5.4 mg/dL — ABNORMAL HIGH (ref 0.44–1.00)
Glucose, Bld: 63 mg/dL — ABNORMAL LOW (ref 70–99)
HCT: 23 % — ABNORMAL LOW (ref 36.0–46.0)
Hemoglobin: 7.8 g/dL — ABNORMAL LOW (ref 12.0–15.0)
Potassium: 4 mmol/L (ref 3.5–5.1)
Sodium: 145 mmol/L (ref 135–145)
TCO2: 46 mmol/L — ABNORMAL HIGH (ref 22–32)

## 2022-03-20 SURGERY — LAPAROSCOPIC INSERTION CONTINUOUS AMBULATORY PERITONEAL DIALYSIS  (CAPD) CATHETER
Anesthesia: General

## 2022-03-20 MED ORDER — SODIUM CHLORIDE 0.9 % IV SOLN: INTRAVENOUS | Status: DC

## 2022-03-20 MED ORDER — FENTANYL CITRATE (PF) 250 MCG/5ML IJ SOLN
INTRAMUSCULAR | Status: AC
  Filled 2022-03-20: qty 5

## 2022-03-20 MED ORDER — PROMETHAZINE HCL 25 MG/ML IJ SOLN: 6.2500 mg | INTRAMUSCULAR | Status: DC | PRN

## 2022-03-20 MED ORDER — HEPARIN 6000 UNIT IRRIGATION SOLUTION
Status: AC
  Filled 2022-03-20: qty 500

## 2022-03-20 MED ORDER — LIDOCAINE 2% (20 MG/ML) 5 ML SYRINGE
INTRAMUSCULAR | Status: DC | PRN
  Administered 2022-03-20: 100 mg via INTRAVENOUS

## 2022-03-20 MED ORDER — CEFAZOLIN SODIUM-DEXTROSE 2-4 GM/100ML-% IV SOLN
2.0000 g | INTRAVENOUS | Status: AC
  Administered 2022-03-20: 2 g via INTRAVENOUS

## 2022-03-20 MED ORDER — SODIUM CHLORIDE 0.9 % IR SOLN
Status: DC | PRN
  Administered 2022-03-20: 1000 mL

## 2022-03-20 MED ORDER — OXYCODONE-ACETAMINOPHEN 5-325 MG PO TABS: 1.0000 | ORAL_TABLET | Freq: Four times a day (QID) | ORAL | 0 refills | Status: DC | PRN

## 2022-03-20 MED ORDER — 0.9 % SODIUM CHLORIDE (POUR BTL) OPTIME
TOPICAL | Status: DC | PRN
  Administered 2022-03-20: 1000 mL

## 2022-03-20 MED ORDER — CHLORHEXIDINE GLUCONATE 4 % EX LIQD: 60.0000 mL | Freq: Once | CUTANEOUS | Status: DC

## 2022-03-20 MED ORDER — PHENYLEPHRINE 80 MCG/ML (10ML) SYRINGE FOR IV PUSH (FOR BLOOD PRESSURE SUPPORT)
PREFILLED_SYRINGE | INTRAVENOUS | Status: AC
  Filled 2022-03-20: qty 10

## 2022-03-20 MED ORDER — EPHEDRINE 5 MG/ML INJ
INTRAVENOUS | Status: AC
  Filled 2022-03-20: qty 5

## 2022-03-20 MED ORDER — IPRATROPIUM-ALBUTEROL 0.5-2.5 (3) MG/3ML IN SOLN
RESPIRATORY_TRACT | Status: AC
  Filled 2022-03-20: qty 3

## 2022-03-20 MED ORDER — SUGAMMADEX SODIUM 200 MG/2ML IV SOLN
INTRAVENOUS | Status: DC | PRN
  Administered 2022-03-20: 100 mg via INTRAVENOUS
  Administered 2022-03-20: 200 mg via INTRAVENOUS

## 2022-03-20 MED ORDER — LIDOCAINE 2% (20 MG/ML) 5 ML SYRINGE
INTRAMUSCULAR | Status: AC
  Filled 2022-03-20: qty 5

## 2022-03-20 MED ORDER — ROCURONIUM BROMIDE 10 MG/ML (PF) SYRINGE
PREFILLED_SYRINGE | INTRAVENOUS | Status: DC | PRN
  Administered 2022-03-20: 80 mg via INTRAVENOUS

## 2022-03-20 MED ORDER — AMISULPRIDE (ANTIEMETIC) 5 MG/2ML IV SOLN: 10.0000 mg | Freq: Once | INTRAVENOUS | Status: DC | PRN

## 2022-03-20 MED ORDER — ORAL CARE MOUTH RINSE: 15.0000 mL | Freq: Once | OROMUCOSAL | Status: AC

## 2022-03-20 MED ORDER — DEXTROSE 50 % IV SOLN
12.5000 g | Freq: Once | INTRAVENOUS | Status: AC
  Administered 2022-03-20: 12.5 g via INTRAVENOUS

## 2022-03-20 MED ORDER — DEXAMETHASONE SODIUM PHOSPHATE 10 MG/ML IJ SOLN
INTRAMUSCULAR | Status: DC | PRN
  Administered 2022-03-20: 5 mg via INTRAVENOUS

## 2022-03-20 MED ORDER — CEFAZOLIN SODIUM-DEXTROSE 2-4 GM/100ML-% IV SOLN
INTRAVENOUS | Status: AC
  Filled 2022-03-20: qty 100

## 2022-03-20 MED ORDER — DEXTROSE 50 % IV SOLN
25.0000 mL | Freq: Once | INTRAVENOUS | Status: AC
  Administered 2022-03-20: 25 mL via INTRAVENOUS

## 2022-03-20 MED ORDER — HEPARIN 6000 UNIT IRRIGATION SOLUTION
Status: DC | PRN
  Administered 2022-03-20: 1

## 2022-03-20 MED ORDER — BUPIVACAINE HCL (PF) 0.25 % IJ SOLN
INTRAMUSCULAR | Status: AC
  Filled 2022-03-20: qty 30

## 2022-03-20 MED ORDER — HYDRALAZINE HCL 20 MG/ML IJ SOLN
INTRAMUSCULAR | Status: DC | PRN
  Administered 2022-03-20: 5 mg via INTRAVENOUS

## 2022-03-20 MED ORDER — DEXTROSE 50 % IV SOLN
INTRAVENOUS | Status: AC
  Administered 2022-03-20: 12.5 g via INTRAVENOUS
  Filled 2022-03-20: qty 50

## 2022-03-20 MED ORDER — FENTANYL CITRATE (PF) 250 MCG/5ML IJ SOLN
INTRAMUSCULAR | Status: DC | PRN
  Administered 2022-03-20: 100 ug via INTRAVENOUS

## 2022-03-20 MED ORDER — FENTANYL CITRATE (PF) 100 MCG/2ML IJ SOLN: 25.0000 ug | INTRAMUSCULAR | Status: DC | PRN

## 2022-03-20 MED ORDER — DEXTROSE 50 % IV SOLN
12.5000 g | INTRAVENOUS | Status: AC
  Filled 2022-03-20: qty 50

## 2022-03-20 MED ORDER — DEXTROSE 50 % IV SOLN
INTRAVENOUS | Status: AC
  Filled 2022-03-20: qty 50

## 2022-03-20 MED ORDER — ROCURONIUM BROMIDE 10 MG/ML (PF) SYRINGE
PREFILLED_SYRINGE | INTRAVENOUS | Status: AC
  Filled 2022-03-20: qty 10

## 2022-03-20 MED ORDER — CHLORHEXIDINE GLUCONATE 0.12 % MT SOLN
OROMUCOSAL | Status: AC
  Administered 2022-03-20: 15 mL via OROMUCOSAL
  Filled 2022-03-20: qty 15

## 2022-03-20 MED ORDER — BUPIVACAINE-EPINEPHRINE 0.25% -1:200000 IJ SOLN
INTRAMUSCULAR | Status: DC | PRN
  Administered 2022-03-20: 7.2 mL

## 2022-03-20 MED ORDER — PROPOFOL 10 MG/ML IV BOLUS
INTRAVENOUS | Status: DC | PRN
  Administered 2022-03-20: 150 mg via INTRAVENOUS

## 2022-03-20 MED ORDER — ALBUTEROL SULFATE (2.5 MG/3ML) 0.083% IN NEBU
2.5000 mg | INHALATION_SOLUTION | Freq: Once | RESPIRATORY_TRACT | Status: AC
  Administered 2022-03-20: 2.5 mg via RESPIRATORY_TRACT

## 2022-03-20 MED ORDER — SUCCINYLCHOLINE CHLORIDE 200 MG/10ML IV SOSY
PREFILLED_SYRINGE | INTRAVENOUS | Status: AC
  Filled 2022-03-20: qty 10

## 2022-03-20 MED ORDER — DEXTROSE 50 % IV SOLN
INTRAVENOUS | Status: DC | PRN
  Administered 2022-03-20: .5 via INTRAVENOUS

## 2022-03-20 MED ORDER — PHENYLEPHRINE 80 MCG/ML (10ML) SYRINGE FOR IV PUSH (FOR BLOOD PRESSURE SUPPORT)
PREFILLED_SYRINGE | INTRAVENOUS | Status: DC | PRN
  Administered 2022-03-20: 80 ug via INTRAVENOUS

## 2022-03-20 MED ORDER — ALBUTEROL SULFATE (2.5 MG/3ML) 0.083% IN NEBU
INHALATION_SOLUTION | RESPIRATORY_TRACT | Status: AC
  Filled 2022-03-20: qty 3

## 2022-03-20 MED ORDER — ACETAMINOPHEN 500 MG PO TABS: 1000.0000 mg | ORAL_TABLET | Freq: Once | ORAL | Status: AC

## 2022-03-20 MED ORDER — PROPOFOL 10 MG/ML IV BOLUS
INTRAVENOUS | Status: AC
  Filled 2022-03-20: qty 20

## 2022-03-20 MED ORDER — ONDANSETRON HCL 4 MG/2ML IJ SOLN
INTRAMUSCULAR | Status: DC | PRN
  Administered 2022-03-20: 4 mg via INTRAVENOUS

## 2022-03-20 MED ORDER — IPRATROPIUM-ALBUTEROL 20-100 MCG/ACT IN AERS
INHALATION_SPRAY | RESPIRATORY_TRACT | Status: DC | PRN
  Administered 2022-03-20: 1 via RESPIRATORY_TRACT

## 2022-03-20 MED ORDER — CHLORHEXIDINE GLUCONATE 0.12 % MT SOLN: 15.0000 mL | Freq: Once | OROMUCOSAL | Status: AC

## 2022-03-20 MED ORDER — MIDAZOLAM HCL 2 MG/2ML IJ SOLN
INTRAMUSCULAR | Status: AC
  Filled 2022-03-20: qty 2

## 2022-03-20 MED ORDER — ACETAMINOPHEN 500 MG PO TABS
ORAL_TABLET | ORAL | Status: AC
  Administered 2022-03-20: 1000 mg
  Filled 2022-03-20: qty 2

## 2022-03-20 MED ORDER — ALBUTEROL SULFATE HFA 108 (90 BASE) MCG/ACT IN AERS
INHALATION_SPRAY | RESPIRATORY_TRACT | Status: DC | PRN
  Administered 2022-03-20: 6 via RESPIRATORY_TRACT

## 2022-03-20 SURGICAL SUPPLY — 59 items
ADAPTER TITANIUM MEDIONICS (MISCELLANEOUS) ×1 IMPLANT
ADH SKN CLS APL DERMABOND .7 (GAUZE/BANDAGES/DRESSINGS) ×1
ADPR DLYS CATH STRL LF DISP (MISCELLANEOUS) ×1
APL PRP STRL LF DISP 70% ISPRP (MISCELLANEOUS) ×1
APPLIER CLIP 5 13 M/L LIGAMAX5 (MISCELLANEOUS)
APR CLP MED LRG 5 ANG JAW (MISCELLANEOUS)
BAG DECANTER FOR FLEXI CONT (MISCELLANEOUS) ×1 IMPLANT
BIOPATCH RED 1 DISK 7.0 (GAUZE/BANDAGES/DRESSINGS) ×1 IMPLANT
BLADE CLIPPER SURG (BLADE) IMPLANT
BLADE SURG 11 STRL SS (BLADE) ×1 IMPLANT
CATH EXTENDED DIALYSIS (CATHETERS) IMPLANT
CHLORAPREP W/TINT 26 (MISCELLANEOUS) ×1 IMPLANT
CLIP APPLIE 5 13 M/L LIGAMAX5 (MISCELLANEOUS) IMPLANT
COVER SURGICAL LIGHT HANDLE (MISCELLANEOUS) ×1 IMPLANT
DERMABOND ADVANCED .7 DNX12 (GAUZE/BANDAGES/DRESSINGS) ×1 IMPLANT
DEVICE TROCAR PUNCTURE CLOSURE (ENDOMECHANICALS) ×1 IMPLANT
DRSG TEGADERM 4X4.75 (GAUZE/BANDAGES/DRESSINGS) ×3 IMPLANT
ELECT REM PT RETURN 9FT ADLT (ELECTROSURGICAL) ×1
ELECTRODE REM PT RTRN 9FT ADLT (ELECTROSURGICAL) ×1 IMPLANT
GAUZE SPONGE 4X4 12PLY STRL (GAUZE/BANDAGES/DRESSINGS) ×1 IMPLANT
GLOVE INDICATOR 6.5 STRL GRN (GLOVE) ×1 IMPLANT
GLOVE SURG UNDER LTX SZ7.5 (GLOVE) ×1 IMPLANT
GOWN STRL REUS W/ TWL LRG LVL3 (GOWN DISPOSABLE) ×2 IMPLANT
GOWN STRL REUS W/ TWL XL LVL3 (GOWN DISPOSABLE) ×1 IMPLANT
GOWN STRL REUS W/TWL LRG LVL3 (GOWN DISPOSABLE) ×2
GOWN STRL REUS W/TWL XL LVL3 (GOWN DISPOSABLE) ×1
GRASPER SUT TROCAR 14GX15 (MISCELLANEOUS) ×1 IMPLANT
IRRIG SUCT STRYKERFLOW 2 WTIP (MISCELLANEOUS)
IRRIGATION SUCT STRKRFLW 2 WTP (MISCELLANEOUS) IMPLANT
IV NS 1000ML (IV SOLUTION) ×1
IV NS 1000ML BAXH (IV SOLUTION) ×1 IMPLANT
KIT BASIN OR (CUSTOM PROCEDURE TRAY) ×1 IMPLANT
KIT TURNOVER KIT B (KITS) ×1 IMPLANT
NDL INSUFFLATION 14GA 120MM (NEEDLE) ×1 IMPLANT
NEEDLE INSUFFLATION 14GA 120MM (NEEDLE) ×1 IMPLANT
NS IRRIG 1000ML POUR BTL (IV SOLUTION) ×1 IMPLANT
PAD ARMBOARD 7.5X6 YLW CONV (MISCELLANEOUS) ×2 IMPLANT
POWDER SURGICEL 3.0 GRAM (HEMOSTASIS) IMPLANT
SCISSORS LAP 5X35 DISP (ENDOMECHANICALS) IMPLANT
SET CYSTO W/LG BORE CLAMP LF (SET/KITS/TRAYS/PACK) ×1 IMPLANT
SET EXT 12IN DIALYSIS STAY-SAF (MISCELLANEOUS) ×1 IMPLANT
SET TUBE SMOKE EVAC HIGH FLOW (TUBING) ×1 IMPLANT
SLEEVE Z-THREAD 5X100MM (TROCAR) ×2 IMPLANT
SPIKE FLUID TRANSFER (MISCELLANEOUS) ×1 IMPLANT
STYLET FALLER (MISCELLANEOUS) ×1 IMPLANT
STYLET FALLER MEDIONICS (MISCELLANEOUS) ×1 IMPLANT
SUT MNCRL AB 4-0 PS2 18 (SUTURE) ×1 IMPLANT
SUT PROLENE 0 SH 30 (SUTURE) ×2 IMPLANT
SUT SILK 0 TIES 10X30 (SUTURE) ×1 IMPLANT
SUT VICRYL 3 0 (SUTURE) IMPLANT
SYR 50ML LL SCALE MARK (SYRINGE) IMPLANT
TOWEL GREEN STERILE (TOWEL DISPOSABLE) ×1 IMPLANT
TOWEL GREEN STERILE FF (TOWEL DISPOSABLE) ×1 IMPLANT
TRAY LAPAROSCOPIC MC (CUSTOM PROCEDURE TRAY) ×1 IMPLANT
TROCAR 11X100 Z THREAD (TROCAR) IMPLANT
TROCAR 5MMX150MM (TROCAR) ×1 IMPLANT
TROCAR KII 8X100ML NONTHREADED (TROCAR) IMPLANT
TROCAR XCEL NON-BLD 5MMX100MML (ENDOMECHANICALS) ×1 IMPLANT
WATER STERILE IRR 1000ML POUR (IV SOLUTION) ×1 IMPLANT

## 2022-03-20 NOTE — Progress Notes (Signed)
Hypoglycemic Event  CBG: 56  Treatment: D50 25 mL (12.5 gm)  Symptoms: None- pt denies symptoms of hypoglycemia in short stay  Follow-up CBG: Time:6:46 CBG Result:85  Possible Reasons for Event: Medication regimen: Pt NPO for surgery, pt took Humulin N insulin this morning.   Comments/MD notified:Dr Tobias Alexander notified.   Pt states her blood sugar was 38 at home this morning. Pt drank orange juice at 3:00 AM. Pt then took 7.5 units Humulin N insulin (half dose) at 3:30AM. Pt CBG 56 upon arrival to short stay. D50 25 mL (12.5 gm) given per protocol. Dr. Tobias Alexander ordered D50 25 mL (12.5 gm) to be given after blood sugar re-check of 85. Pt blood sugar re-check at 7:21 was 71. Dr Tobias Alexander notified. Pt given D50 25 mL (12.5 gm) per Dr Glynda Jaeger order. CRNA notified.     Caryl Pina

## 2022-03-20 NOTE — Op Note (Signed)
    Patient name: KATALIA CHOMA MRN: [id number] DOB: [birth year] Sex: female  03/20/2022 Pre-operative Diagnosis: End-stage renal disease Post-operative diagnosis:  Same Surgeon:  Erlene Quan C. [name], MD Assistant: Vicente Serene, PA Procedure Performed:  Laparoscopic placement of continuous ambulatory peritoneal dialysis catheter  Indications: 62 year old female with end-stage renal disease currently dialyzing via left upper hemodialysis extremity access.  She is now planned for peritoneal dialysis catheter placement.  Findings: There were minimal adhesions in the abdomen.  The omentum was not large enough to reach into the pelvis and therefore no omentopexy was placed.  We instilled 800 cc of saline at completion and returned over 700 cc.   Procedure:  The patient was identified in the holding area and taken to the operating where she was placed upon upper table general anesthesia was induced.  She was sterilely prepped draped the abdomen in the usual fashion, antibiotics were administered and a timeout was called.  A Veress needle was used in the left upper quadrant we were able to get access into the abdomen and the abdomen was insufflated.  In the right upper quadrant we then used a Optiview trocar or able to gain access there.  The Veress needle was then removed.  The abdomen was inspected with 30 degree camera and there were no obvious intra-abdominal injuries and only minimal adhesions in the right lower quadrant.  There was evidence of scarring in the pelvis however no adhesions.  A second port was then placed in the right lateral and mid abdomen with direct visualization.  A grasper was then used and the patient was placed in Trendelenburg and the abdominal contents were moved cephalad.  The catheter was then measured out and an incision was made next the umbilicus and a long trocar was tunneled anterior to the peritoneum and entered the abdomen and the pelvis.  The catheter was then placed down  posterior to the bladder and the cuff was placed just outside the peritoneum.  2 ends of the catheter were then fixed together and this was tunneled up to the left upper quadrant and then out through the left mid abdomen.  Insufflation was taken down and patient was placed in reverse Trendelenburg and 800 cc of saline was easily instilled and then 700 quickly returned.  We reinsufflated the abdomen and inspected the abdominal contents.  As the omentum did not reach the pelvis there was no need for omentopexy.  We then removed all the trocars and the abdomen was desufflated.  Incisions were closed with 4 Monocryl and Dermabond at the skin level.  Patient was then awakened from anesthesia having tolerated the procedure without immediate complication.  All counts were correct at completion.  EBL: 25 cc    Addisyn Leclaire C. [name], MD [facility] Office: [phone] Pager: [phone]

## 2022-03-20 NOTE — Progress Notes (Signed)
Hgb 7.8 per istat. Dr Tobias Alexander notified.

## 2022-03-20 NOTE — Anesthesia Postprocedure Evaluation (Signed)
Anesthesia Post Note  Patient: [name] [name]  Procedure(s) Performed: LAPAROSCOPIC INSERTION CONTINUOUS AMBULATORY PERITONEAL DIALYSIS  (CAPD) CATHETER     Patient location during evaluation: PACU Anesthesia Type: General Level of consciousness: sedated Pain management: pain level controlled Vital Signs Assessment: post-procedure vital signs reviewed and stable Respiratory status: spontaneous breathing and respiratory function stable Cardiovascular status: stable Postop Assessment: no apparent nausea or vomiting Anesthetic complications: no   No notable events documented.  Last Vitals:  Vitals:   03/20/22 1130 03/20/22 1145  BP: (!) 185/83 (!) 163/75  Pulse: 93 96  Resp: 18 17  Temp:  37.1 C  SpO2: 100% 100%    Last Pain:  Vitals:   03/20/22 1145  TempSrc:   PainSc: 0-No pain                 [name] DANIEL

## 2022-03-20 NOTE — Transfer of Care (Signed)
Immediate Anesthesia Transfer of Care Note  Patient: Simmie Davies  Procedure(s) Performed: LAPAROSCOPIC INSERTION CONTINUOUS AMBULATORY PERITONEAL DIALYSIS  (CAPD) CATHETER  Patient Location: PACU  Anesthesia Type:General  Level of Consciousness: awake, alert , and oriented  Airway & Oxygen Therapy: Patient Spontanous Breathing and Patient connected to face mask oxygen  Post-op Assessment: Report given to RN and Post -op Vital signs reviewed and stable  Post vital signs: Reviewed and stable  Last Vitals:  Vitals Value Taken Time  BP 203/83 03/20/22 0915  Temp    Pulse 88 03/20/22 0918  Resp 20 03/20/22 0918  SpO2 100 % 03/20/22 0918  Vitals shown include unvalidated device data.  Last Pain:  Vitals:   03/20/22 0621  TempSrc: Oral  PainSc:          Complications: No notable events documented.

## 2022-03-20 NOTE — Interval H&P Note (Signed)
History and Physical Interval Note:  03/20/2022 7:22 AM  [name] [name]  has presented today for surgery, with the diagnosis of End Stage Renal Disease.  The various methods of treatment have been discussed with the patient and family. After consideration of risks, benefits and other options for treatment, the patient has consented to  Procedure(s): [place]  (CAPD) CATHETER (N/A) POSSIBLE LAPAROSCOPIC OMENTOPEXY/ (N/A) as a surgical intervention.  The patient's history has been reviewed, patient examined, no change in status, stable for surgery.  I have reviewed the patient's chart and labs.  Questions were answered to the patient's satisfaction.     Servando Snare

## 2022-03-20 NOTE — Anesthesia Procedure Notes (Signed)
Procedure Name: Intubation Date/Time: 03/20/2022 7:50 AM  Performed by: Sharee Pimple, CRNAPre-anesthesia Checklist: Patient identified, Emergency Drugs available, Suction available, Patient being monitored and Timeout performed Patient Re-evaluated:Patient Re-evaluated prior to induction Oxygen Delivery Method: Circle system utilized Preoxygenation: Pre-oxygenation with 100% oxygen Induction Type: IV induction Ventilation: Mask ventilation without difficulty Laryngoscope Size: Mac and 4 Grade View: Grade II Tube type: Oral Tube size: 7.0 mm Number of attempts: 2 Airway Equipment and Method: Stylet Placement Confirmation: ETT inserted through vocal cords under direct vision, positive ETCO2 and breath sounds checked- equal and bilateral Secured at: 23 cm Tube secured with: Tape Dental Injury: Teeth and Oropharynx as per pre-operative assessment  Comments: MAC 3 used, epiglottis deep and unable to reach. Switched to [place] 4 and had a grade IIa view.

## 2022-03-21 ENCOUNTER — Encounter (HOSPITAL_COMMUNITY): Payer: Self-pay | Admitting: Vascular Surgery

## 2022-04-16 ENCOUNTER — Telehealth (HOSPITAL_COMMUNITY): Payer: Self-pay | Admitting: *Deleted

## 2022-04-16 NOTE — Telephone Encounter (Signed)
Received fax from Dr Jannifer Hick requesting PD catheter removal. Will give to [facility].

## 2022-04-23 ENCOUNTER — Other Ambulatory Visit: Payer: Self-pay

## 2022-04-23 DIAGNOSIS — N186 End stage renal disease: Secondary | ICD-10-CM

## 2022-05-03 ENCOUNTER — Other Ambulatory Visit: Payer: Self-pay

## 2022-05-03 ENCOUNTER — Encounter (HOSPITAL_COMMUNITY): Payer: Self-pay | Admitting: Vascular Surgery

## 2022-05-03 NOTE — Progress Notes (Signed)
Ms [name] [name] denies  chest pain or shortness of breath.  Patient denies having any s/s of Covid in her household, also denies any known exposure to Covid. Ms [name] [name] denies any s/s of upper or lower respiratory infections in the past 8 weeks.  Ms [name] [name] has type II diabetes. I instructed her  if CBG is greater than 70 to take 1/2 os Humulin N- 7 units.  I instructed patient to check CBG after awaking and every 2 hours until arrival  to the hospital.  Instructed patient if CBG is less than 70 to take 4 Glucose Tablets or 1 tube of Glucose Gel or 1/2 cup of a clear juice. Recheck CBG in 15 minutes if CBG is not over 70 call, pre- op desk at [phone] for further instructions. If scheduled to receive Insulin, do not take Insulin

## 2022-05-04 ENCOUNTER — Other Ambulatory Visit: Payer: Self-pay

## 2022-05-04 ENCOUNTER — Ambulatory Visit (HOSPITAL_COMMUNITY)
Admission: RE | Admit: 2022-05-04 | Discharge: 2022-05-04 | Disposition: A | Discharge status: Home / Self Care | Payer: 59 | Attending: Vascular Surgery | Admitting: Vascular Surgery

## 2022-05-04 ENCOUNTER — Encounter (HOSPITAL_COMMUNITY): Payer: Self-pay | Admitting: Vascular Surgery

## 2022-05-04 ENCOUNTER — Ambulatory Visit (HOSPITAL_COMMUNITY): Payer: 59

## 2022-05-04 ENCOUNTER — Encounter (HOSPITAL_COMMUNITY)
Admission: RE | Disposition: A | Discharge status: Home / Self Care | Payer: Self-pay | Source: Home / Self Care | Attending: Vascular Surgery

## 2022-05-04 ENCOUNTER — Ambulatory Visit (HOSPITAL_BASED_OUTPATIENT_CLINIC_OR_DEPARTMENT_OTHER): Payer: 59

## 2022-05-04 DIAGNOSIS — Z794 Long term (current) use of insulin: Secondary | ICD-10-CM | POA: Diagnosis not present

## 2022-05-04 DIAGNOSIS — I120 Hypertensive chronic kidney disease with stage 5 chronic kidney disease or end stage renal disease: Secondary | ICD-10-CM | POA: Insufficient documentation

## 2022-05-04 DIAGNOSIS — T85691A Other mechanical complication of intraperitoneal dialysis catheter, initial encounter: Secondary | ICD-10-CM

## 2022-05-04 DIAGNOSIS — N186 End stage renal disease: Secondary | ICD-10-CM

## 2022-05-04 DIAGNOSIS — Z992 Dependence on renal dialysis: Secondary | ICD-10-CM

## 2022-05-04 DIAGNOSIS — E1122 Type 2 diabetes mellitus with diabetic chronic kidney disease: Secondary | ICD-10-CM | POA: Diagnosis not present

## 2022-05-04 DIAGNOSIS — N185 Chronic kidney disease, stage 5: Secondary | ICD-10-CM | POA: Diagnosis not present

## 2022-05-04 HISTORY — PX: CAPD REMOVAL: SHX5234

## 2022-05-04 HISTORY — DX: Cardiac murmur, unspecified: R01.1

## 2022-05-04 HISTORY — DX: Personal history of other medical treatment: Z92.89

## 2022-05-04 LAB — POCT I-STAT, CHEM 8
BUN: 50 mg/dL — ABNORMAL HIGH (ref 6–20)
Calcium, Ion: 1.07 mmol/L — ABNORMAL LOW (ref 1.15–1.40)
Chloride: 100 mmol/L (ref 98–111)
Creatinine, Ser: 7.5 mg/dL — ABNORMAL HIGH (ref 0.44–1.00)
Glucose, Bld: 64 mg/dL — ABNORMAL LOW (ref 70–99)
HCT: 31 % — ABNORMAL LOW (ref 36.0–46.0)
Hemoglobin: 10.5 g/dL — ABNORMAL LOW (ref 12.0–15.0)
Potassium: 4.5 mmol/L (ref 3.5–5.1)
Sodium: 136 mmol/L (ref 135–145)
TCO2: 26 mmol/L (ref 22–32)

## 2022-05-04 LAB — GLUCOSE, CAPILLARY
Glucose-Capillary: 66 mg/dL — ABNORMAL LOW (ref 70–99)
Glucose-Capillary: 107 mg/dL — ABNORMAL HIGH (ref 70–99)
Glucose-Capillary: 128 mg/dL — ABNORMAL HIGH (ref 70–99)

## 2022-05-04 SURGERY — LAPAROSCOPIC REMOVAL CONTINUOUS AMBULATORY PERITONEAL DIALYSIS  (CAPD) CATHETER
Anesthesia: General | Site: Abdomen

## 2022-05-04 MED ORDER — DEXTROSE 50 % IV SOLN: 12.5000 g | Freq: Once | INTRAVENOUS | Status: AC

## 2022-05-04 MED ORDER — SODIUM CHLORIDE 0.9 % IV SOLN: INTRAVENOUS | Status: DC

## 2022-05-04 MED ORDER — FENTANYL CITRATE (PF) 100 MCG/2ML IJ SOLN
25.0000 ug | INTRAMUSCULAR | Status: DC | PRN
  Administered 2022-05-04: 25 ug via INTRAVENOUS
  Administered 2022-05-04: 50 ug via INTRAVENOUS

## 2022-05-04 MED ORDER — LACTATED RINGERS IV SOLN: INTRAVENOUS | Status: DC

## 2022-05-04 MED ORDER — SUGAMMADEX SODIUM 200 MG/2ML IV SOLN
INTRAVENOUS | Status: DC | PRN
  Administered 2022-05-04: 200 mg via INTRAVENOUS

## 2022-05-04 MED ORDER — CHLORHEXIDINE GLUCONATE 4 % EX LIQD: 60.0000 mL | Freq: Once | CUTANEOUS | Status: DC

## 2022-05-04 MED ORDER — LABETALOL HCL 5 MG/ML IV SOLN
INTRAVENOUS | Status: AC
  Filled 2022-05-04: qty 4

## 2022-05-04 MED ORDER — FENTANYL CITRATE (PF) 250 MCG/5ML IJ SOLN: INTRAMUSCULAR | Status: DC | PRN

## 2022-05-04 MED ORDER — MEPERIDINE HCL 25 MG/ML IJ SOLN: 6.2500 mg | INTRAMUSCULAR | Status: DC | PRN

## 2022-05-04 MED ORDER — CLONIDINE HCL 0.2 MG PO TABS
ORAL_TABLET | ORAL | Status: AC
  Filled 2022-05-04: qty 1

## 2022-05-04 MED ORDER — LABETALOL HCL 5 MG/ML IV SOLN
5.0000 mg | INTRAVENOUS | Status: AC
  Administered 2022-05-04 (×2): 5 mg via INTRAVENOUS

## 2022-05-04 MED ORDER — CEFAZOLIN SODIUM-DEXTROSE 2-4 GM/100ML-% IV SOLN
2.0000 g | INTRAVENOUS | Status: AC
  Administered 2022-05-04: 2 g via INTRAVENOUS
  Filled 2022-05-04: qty 100

## 2022-05-04 MED ORDER — LABETALOL HCL 5 MG/ML IV SOLN
INTRAVENOUS | Status: DC | PRN
  Administered 2022-05-04: 10 mg via INTRAVENOUS

## 2022-05-04 MED ORDER — INSULIN ASPART 100 UNIT/ML IJ SOLN: 0.0000 [IU] | INTRAMUSCULAR | Status: DC | PRN

## 2022-05-04 MED ORDER — MIDAZOLAM HCL 2 MG/2ML IJ SOLN
INTRAMUSCULAR | Status: DC | PRN
  Administered 2022-05-04: 1 mg via INTRAVENOUS

## 2022-05-04 MED ORDER — PROMETHAZINE HCL 25 MG/ML IJ SOLN: 6.2500 mg | INTRAMUSCULAR | Status: DC | PRN

## 2022-05-04 MED ORDER — FENTANYL CITRATE (PF) 250 MCG/5ML IJ SOLN
INTRAMUSCULAR | Status: DC | PRN
  Administered 2022-05-04 (×2): 50 ug via INTRAVENOUS

## 2022-05-04 MED ORDER — FENTANYL CITRATE (PF) 250 MCG/5ML IJ SOLN
INTRAMUSCULAR | Status: AC
  Filled 2022-05-04: qty 5

## 2022-05-04 MED ORDER — OXYCODONE HCL 5 MG/5ML PO SOLN: 5.0000 mg | Freq: Once | ORAL | Status: DC | PRN

## 2022-05-04 MED ORDER — ONDANSETRON HCL 4 MG/2ML IJ SOLN
INTRAMUSCULAR | Status: DC | PRN
  Administered 2022-05-04: 4 mg via INTRAVENOUS

## 2022-05-04 MED ORDER — ORAL CARE MOUTH RINSE: 15.0000 mL | Freq: Once | OROMUCOSAL | Status: AC

## 2022-05-04 MED ORDER — OXYCODONE HCL 5 MG PO TABS: 5.0000 mg | ORAL_TABLET | Freq: Once | ORAL | Status: DC | PRN

## 2022-05-04 MED ORDER — OXYCODONE-ACETAMINOPHEN 10-325 MG PO TABS: 1.0000 | ORAL_TABLET | Freq: Four times a day (QID) | ORAL | 0 refills | Status: DC | PRN

## 2022-05-04 MED ORDER — MIDAZOLAM HCL 2 MG/2ML IJ SOLN
INTRAMUSCULAR | Status: AC
  Filled 2022-05-04: qty 2

## 2022-05-04 MED ORDER — PROPOFOL 10 MG/ML IV BOLUS
INTRAVENOUS | Status: AC
  Filled 2022-05-04: qty 20

## 2022-05-04 MED ORDER — CLONIDINE HCL 0.2 MG PO TABS
0.2000 mg | ORAL_TABLET | Freq: Once | ORAL | Status: AC
  Administered 2022-05-04: 0.2 mg via ORAL

## 2022-05-04 MED ORDER — FENTANYL CITRATE (PF) 100 MCG/2ML IJ SOLN
INTRAMUSCULAR | Status: AC
  Filled 2022-05-04: qty 2

## 2022-05-04 MED ORDER — 0.9 % SODIUM CHLORIDE (POUR BTL) OPTIME
TOPICAL | Status: DC | PRN
  Administered 2022-05-04: 1000 mL

## 2022-05-04 MED ORDER — LIDOCAINE-EPINEPHRINE 1 %-1:100000 IJ SOLN
INTRAMUSCULAR | Status: AC
  Filled 2022-05-04: qty 1

## 2022-05-04 MED ORDER — LIDOCAINE-EPINEPHRINE (PF) 1 %-1:200000 IJ SOLN
INTRAMUSCULAR | Status: AC
  Filled 2022-05-04: qty 30

## 2022-05-04 MED ORDER — LIDOCAINE 2% (20 MG/ML) 5 ML SYRINGE
INTRAMUSCULAR | Status: DC | PRN
  Administered 2022-05-04: 20 mg via INTRAVENOUS

## 2022-05-04 MED ORDER — DEXTROSE 50 % IV SOLN
INTRAVENOUS | Status: AC
  Administered 2022-05-04: 12.5 g via INTRAVENOUS
  Filled 2022-05-04: qty 50

## 2022-05-04 MED ORDER — ROCURONIUM BROMIDE 10 MG/ML (PF) SYRINGE
PREFILLED_SYRINGE | INTRAVENOUS | Status: DC | PRN
  Administered 2022-05-04: 50 mg via INTRAVENOUS

## 2022-05-04 MED ORDER — LIDOCAINE-EPINEPHRINE (PF) 1 %-1:200000 IJ SOLN
INTRAMUSCULAR | Status: DC | PRN
  Administered 2022-05-04: 13 mL

## 2022-05-04 MED ORDER — PROPOFOL 10 MG/ML IV BOLUS
INTRAVENOUS | Status: DC | PRN
  Administered 2022-05-04 (×2): 100 mg via INTRAVENOUS
  Administered 2022-05-04: 40 mg via INTRAVENOUS

## 2022-05-04 MED ORDER — DEXAMETHASONE SODIUM PHOSPHATE 10 MG/ML IJ SOLN
INTRAMUSCULAR | Status: DC | PRN
  Administered 2022-05-04: 10 mg via INTRAVENOUS

## 2022-05-04 MED ORDER — ACETAMINOPHEN 500 MG PO TABS
1000.0000 mg | ORAL_TABLET | Freq: Once | ORAL | Status: AC
  Administered 2022-05-04: 1000 mg via ORAL
  Filled 2022-05-04: qty 2

## 2022-05-04 MED ORDER — MIDAZOLAM HCL 2 MG/2ML IJ SOLN: 0.5000 mg | Freq: Once | INTRAMUSCULAR | Status: DC | PRN

## 2022-05-04 MED ORDER — CHLORHEXIDINE GLUCONATE 0.12 % MT SOLN
15.0000 mL | Freq: Once | OROMUCOSAL | Status: AC
  Administered 2022-05-04: 15 mL via OROMUCOSAL
  Filled 2022-05-04: qty 15

## 2022-05-04 SURGICAL SUPPLY — 38 items
ADH SKN CLS APL DERMABOND .7 (GAUZE/BANDAGES/DRESSINGS) ×1
APL PRP STRL LF DISP 70% ISPRP (MISCELLANEOUS) ×1
BAG COUNTER SPONGE SURGICOUNT (BAG) ×1 IMPLANT
BAG SPNG CNTER NS LX DISP (BAG) ×1
CANISTER SUCT 3000ML PPV (MISCELLANEOUS) IMPLANT
CHLORAPREP W/TINT 26 (MISCELLANEOUS) ×1 IMPLANT
COVER SURGICAL LIGHT HANDLE (MISCELLANEOUS) ×1 IMPLANT
DERMABOND ADVANCED .7 DNX12 (GAUZE/BANDAGES/DRESSINGS) ×1 IMPLANT
DISSECTOR BLUNT TIP ENDO 5MM (MISCELLANEOUS) IMPLANT
DRAPE INCISE IOBAN 66X45 STRL (DRAPES) IMPLANT
DRAPE LAPAROSCOPIC ABDOMINAL (DRAPES) IMPLANT
ELECT REM PT RETURN 9FT ADLT (ELECTROSURGICAL)
ELECTRODE REM PT RTRN 9FT ADLT (ELECTROSURGICAL) ×1 IMPLANT
GAUZE SPONGE 2X2 8PLY STRL LF (GAUZE/BANDAGES/DRESSINGS) IMPLANT
GLOVE BIOGEL PI IND STRL 7.5 (GLOVE) ×1 IMPLANT
GOWN STRL REUS W/ TWL LRG LVL3 (GOWN DISPOSABLE) ×2 IMPLANT
GOWN STRL REUS W/ TWL XL LVL3 (GOWN DISPOSABLE) ×1 IMPLANT
GOWN STRL REUS W/TWL LRG LVL3 (GOWN DISPOSABLE) ×2
GOWN STRL REUS W/TWL XL LVL3 (GOWN DISPOSABLE) ×1
KIT BASIN OR (CUSTOM PROCEDURE TRAY) ×1 IMPLANT
KIT TURNOVER KIT B (KITS) ×1 IMPLANT
NDL HYPO 25GX1X1/2 BEV (NEEDLE) IMPLANT
NEEDLE HYPO 25GX1X1/2 BEV (NEEDLE) ×1 IMPLANT
NS IRRIG 1000ML POUR BTL (IV SOLUTION) ×1 IMPLANT
PACK GENERAL/GYN (CUSTOM PROCEDURE TRAY) ×1 IMPLANT
PAD ARMBOARD 7.5X6 YLW CONV (MISCELLANEOUS) ×1 IMPLANT
SUT ETHILON 5 0 PS 2 18 (SUTURE) IMPLANT
SUT MNCRL AB 4-0 PS2 18 (SUTURE) IMPLANT
SUT MON AB 5-0 PS2 18 (SUTURE) IMPLANT
SUT VIC AB 2-0 CT1 27 (SUTURE) ×2
SUT VIC AB 2-0 CT1 TAPERPNT 27 (SUTURE) IMPLANT
SUT VIC AB 3-0 SH 27 (SUTURE) ×1
SUT VIC AB 3-0 SH 27X BRD (SUTURE) ×2 IMPLANT
SUT VICRYL 0 UR6 27IN ABS (SUTURE) ×2 IMPLANT
SUT VICRYL 4-0 PS2 18IN ABS (SUTURE) ×2 IMPLANT
SYR CONTROL 10ML LL (SYRINGE) IMPLANT
TOWEL GREEN STERILE FF (TOWEL DISPOSABLE) ×1 IMPLANT
WATER STERILE IRR 1000ML POUR (IV SOLUTION) ×1 IMPLANT

## 2022-05-04 NOTE — H&P (Addendum)
ASSESSMENT & PLAN   END-STAGE RENAL DISEASE: The patient has a functioning fistula and dialyzes on Tuesdays Thursdays and Saturdays.  She does not want to do peritoneal dialysis and presents today to have her dialysis catheter removed.  The procedure and potential complications were discussed with the patient.  All of her questions were answered and she is agreeable to proceed.  REASON FOR VISIT:    For removal of peritoneal dialysis catheter.  HPI:   [name] [name] is a 62 y.o. female who has a history of end-stage renal disease and dialyzes with a left arm fistula on Tuesdays Thursdays and Saturdays.  She wished to do peritoneal dialysis.  On 03/20/2022 she had placement of a peritoneal dialysis catheter laparoscopically by Dr. [name].  She has now decided that she does not want to do peritoneal dialysis.  She is here to have the catheter removed.  Past Medical History:  Diagnosis Date   + 02/17/2019   treated   Abdominal pain 08/13/2016   Abdominal pain, unspecified site 07/17/2013   Abnormal EKG 08/29/2013   Acute gastric ulcer without hemorrhage or perforation    AKI (acute kidney injury) ([place]) 11/15/2016   Anemia    Anxiety    ARTHRITIS 03/10/2008   Qualifier: Diagnosis of  By: Nolon Rod CMA ([place]), Robin     Asthma    Atypical chest pain 03/09/2009   Qualifier: Diagnosis of  By: Trellis Paganini PA-c, Amy S    BRONCHITIS, CHRONIC 03/10/2008   Qualifier: Diagnosis of  By: Nolon Rod CMA (AAMA), Robin     Chest pain 08/28/2013   Chronic lower back pain    10-27-13 not a problem at this time   CKD (chronic kidney disease), stage III ([place]) 10/17/2016   CKD (chronic kidney disease), stage V ([place]) 07/02/2019   Closed fracture of coccyx ([place]) 02/17/2019   Closed fracture of phalanx of foot 02/17/2019   Corns and callosities 08/13/2016   Depression    Diabetic neuropathy associated with type 2 diabetes mellitus ([place]) 12/25/2015   Diarrhea 07/17/2013   DM (diabetes mellitus), type 2 with  neurological complications ([place]) [id number]   Qualifier: Diagnosis of  By: Shane Crutch, Amy S    Dyskinesia of esophagus 03/10/2008   Centricity Description: ESOPHAGEAL SPASM Qualifier: Diagnosis of  By: Nolon Rod CMA ([name]), Robin   Centricity Description: ESOPHAGEAL MOTILITY DISORDER Qualifier: Diagnosis of  By: Nolon Rod CMA ([place]), Robin     Dysphagia 03/09/2009   Qualifier: Diagnosis of  By: Shane Crutch, Amy S    End stage renal disease ([place]) 02/17/2019   Essential hypertension 03/10/2008   Qualifier: Diagnosis of  By: Nolon Rod CMA (AAMA), Robin     Frequent UTI    Gastritis 08/13/2016   GERD (gastroesophageal reflux disease)    Heart murmur    in Dr. Geraldo Pitter notes   HEPATITIS C, CHRONIC 03/10/2008   Qualifier: Diagnosis of  By: Nolon Rod CMA ([place]), Robin     High cholesterol    History of blood transfusion    Hypertensive crisis 10/17/2021   Hypertensive emergency 07/03/2019   Hypertensive renal disease 02/17/2019   Hypertensive urgency 08/28/2013   Ileus ([place]) 10/21/2021   Impingement syndrome of left shoulder region 02/17/2019   Lumbar radiculopathy 07/25/2018   Added automatically from request for surgery [id number]   Microscopic hematuria 10/17/2016   Migraine    "monthly" (08/28/2013)   NAUSEA AND VOMITING 12/20/2008   Qualifier: Diagnosis of  By: Samule Dry AND  VOMITING 12/20/2008   Pneumonia    "several times"   Type 2 diabetes mellitus ([place]) 07/02/2019    Family History  Problem Relation Age of Onset   Hypertension Mother    Alzheimer's disease Father    Throat cancer Sister 47       died at age 18   Diabetes Brother    Colon cancer Neg Hx    Rectal cancer Neg Hx    Stomach cancer Neg Hx     SOCIAL HISTORY: Social History   Tobacco Use   Smoking status: Never   Smokeless tobacco: Never  Substance Use Topics   Alcohol use: Never    Allergies  Allergen Reactions   Hydrochlorothiazide Itching   Amlodipine     Foot  swelling/chest pain    Duloxetine     Hallucinations    Lisinopril Cough   Metformin     Unknown reaction   Codeine Itching    No hives or rash, takes benadryl   Dilaudid [Hydromorphone Hcl] Itching    "mild itching hrs after dilaudid given"    Current Facility-Administered Medications  Medication Dose Route Frequency Provider Last Rate Last Admin   0.9 %  sodium chloride infusion   Intravenous Continuous Waynetta Sandy, MD 10 mL/hr at 05/04/22 [id number] Continued from Pre-op at 05/04/22 0937   acetaminophen (TYLENOL) tablet 1,000 mg  1,000 mg Oral Once Annye Asa, MD       ceFAZolin (ANCEF) IVPB 2g/100 mL premix  2 g Intravenous To SS-Surg Waynetta Sandy, MD       chlorhexidine (HIBICLENS) 4 % liquid 4 Application  60 mL Topical Once Waynetta Sandy, MD       And   [START ON 05/05/2022] chlorhexidine (HIBICLENS) 4 % liquid 4 Application  60 mL Topical Once Waynetta Sandy, MD       chlorhexidine (PERIDEX) 0.12 % solution 15 mL  15 mL Mouth/Throat Once Annye Asa, MD       Or   Oral care mouth rinse  15 mL Mouth Rinse Once Annye Asa, MD       insulin aspart (novoLOG) injection 0-7 Units  0-7 Units Subcutaneous Q2H PRN Annye Asa, MD       lactated ringers infusion   Intravenous Continuous Annye Asa, MD       Facility-Administered Medications Ordered in Other Encounters  Medication Dose Route Frequency Provider Last Rate Last Admin   0.9 %  sodium chloride infusion   Intravenous Continuous PRN Neldon Newport, CRNA   New Bag at 07/02/19 1304    REVIEW OF SYSTEMS:  [X]  denotes positive finding, [ ]  denotes negative finding Cardiac  Comments:  Chest pain or chest pressure:    Shortness of breath upon exertion:    Short of breath when lying flat:    Irregular heart rhythm:        Vascular    Pain in calf, thigh, or hip brought on by ambulation:    Pain in feet at night that wakes you up from your sleep:      Blood clot in your veins:    Leg swelling:         Pulmonary    Oxygen at home:    Productive cough:     Wheezing:         Neurologic    Sudden weakness in arms or legs:     Sudden numbness in arms or legs:     Sudden onset of  difficulty speaking or slurred speech:    Temporary loss of vision in one eye:     Problems with dizziness:         Gastrointestinal    Blood in stool:     Vomited blood:         Genitourinary    Burning when urinating:     Blood in urine:        Psychiatric    Major depression:         Hematologic    Bleeding problems:    Problems with blood clotting too easily:        Skin    Rashes or ulcers:        Constitutional    Fever or chills:    -  PHYSICAL EXAM:   Vitals:   05/04/22 0859  BP: (!) 186/86  Pulse: 86  Resp: 16  Temp: 99.8 F (37.7 C)  TempSrc: Oral  SpO2: 99%  Weight: 99.8 kg  Height: 5\' 5"  (1.651 m)   Body mass index is 36.61 kg/m. GENERAL: The patient is a well-nourished female, in no acute distress. The vital signs are documented above. CARDIAC: There is a regular rate and rhythm.  VASCULAR: She has a good thrill in her left upper arm access.  PULMONARY: There is good air exchange bilaterally without wheezing or rales. ABDOMEN: Soft and non-tender. MUSCULOSKELETAL: There are no major deformities. NEUROLOGIC: No focal weakness or paresthesias are detected. SKIN: There are no ulcers or rashes noted. PSYCHIATRIC: The patient has a normal affect.  DATA:    Today's labs are pending.  Deitra Mayo [facility]

## 2022-05-04 NOTE — Anesthesia Preprocedure Evaluation (Addendum)
Anesthesia Evaluation  Patient identified by MRN, date of birth, ID band Patient awake    Reviewed: Allergy & Precautions, NPO status , Patient's Chart, lab work & pertinent test results, reviewed documented beta blocker date and time   History of Anesthesia Complications Negative for: history of anesthetic complications  Airway Mallampati: II  TM Distance: >3 FB Neck ROM: Full    Dental  (+) Dental Advisory Given, Missing   Pulmonary COPD,  COPD inhaler   breath sounds clear to auscultation       Cardiovascular hypertension, Pt. on medications and Pt. on home beta blockers (-) angina  Rhythm:Regular Rate:Normal  10/2021 ECHO: EF 60-65%, normal LVF, mod LVH, Grade 1 DD, normal RVF, trivial MR, trivial AI   Neuro/Psych  Headaches  Anxiety Depression       GI/Hepatic ,GERD  Medicated and Controlled,,(+) Hepatitis -  Endo/Other  diabetes (glu 64), Insulin Dependent  BMI 36.6  Renal/GU Dialysis and ESRFRenal disease (K+ 4.5)     Musculoskeletal  (+) Arthritis ,    Abdominal   Peds  Hematology   Anesthesia Other Findings   Reproductive/Obstetrics                             Anesthesia Physical Anesthesia Plan  ASA: 3  Anesthesia Plan: General   Post-op Pain Management: Tylenol PO (pre-op)*   Induction:   PONV Risk Score and Plan: 3 and Ondansetron and Dexamethasone  Airway Management Planned: Oral ETT  Additional Equipment: None  Intra-op Plan:   Post-operative Plan: Extubation in OR  Informed Consent: I have reviewed the patients History and Physical, chart, labs and discussed the procedure including the risks, benefits and alternatives for the proposed anesthesia with the patient or authorized representative who has indicated his/her understanding and acceptance.     Dental advisory given  Plan Discussed with: CRNA and Surgeon  Anesthesia Plan Comments:          Anesthesia Quick Evaluation

## 2022-05-04 NOTE — Anesthesia Procedure Notes (Cosign Needed)
Procedure Name: Intubation Date/Time: 05/04/2022 10:22 AM  Performed by: Annye Asa, MDPre-anesthesia Checklist: Patient identified, Emergency Drugs available, Suction available and Patient being monitored Patient Re-evaluated:Patient Re-evaluated prior to induction Oxygen Delivery Method: Circle system utilized Preoxygenation: Pre-oxygenation with 100% oxygen Induction Type: IV induction Ventilation: Mask ventilation without difficulty Tube type: Oral Tube size: 7.0 mm Number of attempts: 1 Airway Equipment and Method: Stylet and Oral airway Placement Confirmation: ETT inserted through vocal cords under direct vision, positive ETCO2 and breath sounds checked- equal and bilateral Tube secured with: Tape Dental Injury: Teeth and Oropharynx as per pre-operative assessment

## 2022-05-04 NOTE — Progress Notes (Signed)
Blood sugar 64 on istat8. Dr Jenita Seashore notified. Pt asymptomatic. Pt states that she has not taken any insulin this morning.  New order for half of an amp of D50. Dextrose given and Clarise Cruz, RN notified.

## 2022-05-04 NOTE — Op Note (Signed)
    NAME: [name]    MRN: [id number] DOB: 07-Feb-1961    DATE OF OPERATION: 05/04/2022  PREOP DIAGNOSIS:    End-stage renal disease  POSTOP DIAGNOSIS:    Same  PROCEDURE:    Removal of peritoneal dialysis catheter  SURGEON: Judeth Cornfield. Scot Dock, MD  ASSIST: None  ANESTHESIA: General  EBL: Minimal  INDICATIONS:    [name] [name] is a 62 y.o. female who had a peritoneal dialysis catheter placed previously but elected to no longer proceed with peritoneal dialysis.  She presents to have this removed.  She has a functioning left upper arm access.  FINDINGS:   No signs of infection.  TECHNIQUE:   The patient was taken to the operating room and received a general anesthetic.  The abdomen was prepped and draped in usual sterile fashion.  The incision just to the left of the umbilicus was opened and the dissection carried down to the catheter and the cuff here the cuff tear was dissected free.  The catheter was clamped and then divided.  A separate incision was made at the superior aspect of the abdomen where the most superior cuff was located.  Here this was dissected free.  Finally at the exit site the catheter here was dissected free.  The catheter was easily removed.  Next I remove the catheter where it entered the fascia.  A 2-0 Vicryl was placed here to close the over the fascia.  The wound was irrigated.  This wound was closed with 2 deep layers of 3-0 Vicryl and the skin closed with 4-0 Monocryl.  The exit site was closed with a 4-0 Monocryl.  The superior incision was closed with a deep layer of 3-0 Vicryl and the skin closed with 4-0 Monocryl.  Dermabond was applied.  The patient tolerated the procedure well and was transferred to the recovery room in stable condition.  All needle and sponge counts were correct.  Deitra Mayo, MD, FACS [facility]  DATE OF DICTATION:   05/04/2022

## 2022-05-04 NOTE — Transfer of Care (Signed)
Immediate Anesthesia Transfer of Care Note  Patient: [name] [name]  Procedure(s) Performed: REMOVAL CONTINUOUS AMBULATORY PERITONEAL DIALYSIS  (CAPD) CATHETER (Abdomen)  Patient Location: PACU  Anesthesia Type:General  Level of Consciousness: awake, drowsy, and patient cooperative  Airway & Oxygen Therapy: Patient Spontanous Breathing and Patient connected to face mask oxygen  Post-op Assessment: Report given to RN, Post -op Vital signs reviewed and stable, and Patient moving all extremities X 4  Post vital signs: Reviewed and stable  Last Vitals:  Vitals Value Taken Time  BP 162/123 05/04/22 1136  Temp    Pulse 80 05/04/22 1140  Resp 19 05/04/22 1140  SpO2 98 % 05/04/22 1140  Vitals shown include unvalidated device data.  Last Pain:  Vitals:   05/04/22 0934  TempSrc:   PainSc: 0-No pain         Complications: No notable events documented.

## 2022-05-04 NOTE — Anesthesia Postprocedure Evaluation (Signed)
Anesthesia Post Note  Patient: [name] [name]  Procedure(s) Performed: REMOVAL CONTINUOUS AMBULATORY PERITONEAL DIALYSIS  (CAPD) CATHETER (Abdomen)     Patient location during evaluation: PACU Anesthesia Type: General Level of consciousness: awake and alert, patient cooperative and oriented Pain management: pain level controlled Vital Signs Assessment: post-procedure vital signs reviewed and stable Respiratory status: spontaneous breathing, nonlabored ventilation and respiratory function stable Cardiovascular status: blood pressure returned to baseline and stable Postop Assessment: no apparent nausea or vomiting and able to ambulate Anesthetic complications: no   No notable events documented.  Last Vitals:  Vitals:   05/04/22 1206 05/04/22 1212  BP: (!) 182/99 (!) 156/83  Pulse: 78 78  Resp: 17 15  Temp:    SpO2: 94% 92%    Last Pain:  Vitals:   05/04/22 1204  TempSrc:   PainSc: 8                  Oluwanifemi Susman,E. Oswald Pott

## 2022-05-04 NOTE — Progress Notes (Signed)
25 mcg IV fentanyl wasted in stericycle with Jeannine Kitten RN after patient discharged.

## 2022-05-05 ENCOUNTER — Encounter (HOSPITAL_COMMUNITY): Payer: Self-pay | Admitting: Vascular Surgery

## 2022-06-11 ENCOUNTER — Other Ambulatory Visit: Payer: Self-pay | Admitting: Physician Assistant

## 2022-12-27 ENCOUNTER — Ambulatory Visit: Payer: Medicare HMO | Admitting: Podiatry

## 2023-01-12 ENCOUNTER — Encounter (HOSPITAL_COMMUNITY): Payer: Self-pay

## 2023-01-12 ENCOUNTER — Telehealth: Payer: Self-pay | Admitting: Neurology

## 2023-01-12 NOTE — Telephone Encounter (Signed)
Discussion with Hospitalist at [facility]:  Briefly, Ms. [name] [name] is a 62 y.o. female with ESRD on HD who missed 2 sessions of HD and then had a GTC seizure at home and was brought in to [facility] by EMS. She underwent HD at [facility] x 2 and had a routine EEG which was notable for R temporal sharps. Pending MRI. CT Head negative. She had another seizure at [place] where she bit her tongue. No prior hx of seizures. Teleneurology consulted and 1 hour of rEEG obtained with R temporal sharp waves. Teleneurology loaded with Fosphenytoin and started on PHT 100 TID. She is not back to her baseline. They requested transfer to a center with cEEG.  Per hospitalist, she is somnolent, she wakes up to voice and sluggish responses to voice, moves all extermities and goes back to sleep.  However, having periods of somnolence where she was difficult to arouse for the RN for over an hour. This raises some suspicion for subclinical seizures.  I think it is fair to bring her to [facility] for evaluation for a cEEG. She will need to be in a progressive bed with hospitalist team being primary and neurology consult.  Erick Blinks [facility]

## 2023-01-13 ENCOUNTER — Other Ambulatory Visit: Payer: Self-pay

## 2023-01-13 ENCOUNTER — Inpatient Hospital Stay (HOSPITAL_COMMUNITY)
Admission: EM | Admit: 2023-01-13 | Discharge: 2023-01-16 | DRG: 100 | Disposition: A | Discharge status: Home Health | Payer: Medicare HMO | Source: Other Acute Inpatient Hospital | Attending: Internal Medicine | Admitting: Internal Medicine

## 2023-01-13 ENCOUNTER — Inpatient Hospital Stay (HOSPITAL_COMMUNITY): Payer: Medicare HMO

## 2023-01-13 ENCOUNTER — Encounter (HOSPITAL_COMMUNITY): Payer: Self-pay | Admitting: Student

## 2023-01-13 DIAGNOSIS — Z885 Allergy status to narcotic agent status: Secondary | ICD-10-CM

## 2023-01-13 DIAGNOSIS — Z87440 Personal history of urinary (tract) infections: Secondary | ICD-10-CM

## 2023-01-13 DIAGNOSIS — R569 Unspecified convulsions: Principal | ICD-10-CM | POA: Diagnosis present

## 2023-01-13 DIAGNOSIS — E1140 Type 2 diabetes mellitus with diabetic neuropathy, unspecified: Secondary | ICD-10-CM | POA: Diagnosis present

## 2023-01-13 DIAGNOSIS — Z90710 Acquired absence of both cervix and uterus: Secondary | ICD-10-CM

## 2023-01-13 DIAGNOSIS — I120 Hypertensive chronic kidney disease with stage 5 chronic kidney disease or end stage renal disease: Secondary | ICD-10-CM | POA: Diagnosis present

## 2023-01-13 DIAGNOSIS — E1149 Type 2 diabetes mellitus with other diabetic neurological complication: Secondary | ICD-10-CM | POA: Diagnosis present

## 2023-01-13 DIAGNOSIS — E1165 Type 2 diabetes mellitus with hyperglycemia: Secondary | ICD-10-CM | POA: Diagnosis present

## 2023-01-13 DIAGNOSIS — Z820 Family history of epilepsy and other diseases of the nervous system: Secondary | ICD-10-CM

## 2023-01-13 DIAGNOSIS — K219 Gastro-esophageal reflux disease without esophagitis: Secondary | ICD-10-CM | POA: Diagnosis present

## 2023-01-13 DIAGNOSIS — M25512 Pain in left shoulder: Secondary | ICD-10-CM | POA: Diagnosis present

## 2023-01-13 DIAGNOSIS — Z79899 Other long term (current) drug therapy: Secondary | ICD-10-CM

## 2023-01-13 DIAGNOSIS — D72829 Elevated white blood cell count, unspecified: Secondary | ICD-10-CM | POA: Diagnosis present

## 2023-01-13 DIAGNOSIS — Z833 Family history of diabetes mellitus: Secondary | ICD-10-CM

## 2023-01-13 DIAGNOSIS — Z794 Long term (current) use of insulin: Secondary | ICD-10-CM

## 2023-01-13 DIAGNOSIS — D631 Anemia in chronic kidney disease: Secondary | ICD-10-CM | POA: Diagnosis present

## 2023-01-13 DIAGNOSIS — Z9049 Acquired absence of other specified parts of digestive tract: Secondary | ICD-10-CM | POA: Diagnosis not present

## 2023-01-13 DIAGNOSIS — Z8619 Personal history of other infectious and parasitic diseases: Secondary | ICD-10-CM

## 2023-01-13 DIAGNOSIS — Z808 Family history of malignant neoplasm of other organs or systems: Secondary | ICD-10-CM

## 2023-01-13 DIAGNOSIS — N2581 Secondary hyperparathyroidism of renal origin: Secondary | ICD-10-CM | POA: Diagnosis present

## 2023-01-13 DIAGNOSIS — E669 Obesity, unspecified: Secondary | ICD-10-CM | POA: Diagnosis present

## 2023-01-13 DIAGNOSIS — Z8673 Personal history of transient ischemic attack (TIA), and cerebral infarction without residual deficits: Secondary | ICD-10-CM | POA: Diagnosis not present

## 2023-01-13 DIAGNOSIS — N186 End stage renal disease: Secondary | ICD-10-CM | POA: Diagnosis present

## 2023-01-13 DIAGNOSIS — E7800 Pure hypercholesterolemia, unspecified: Secondary | ICD-10-CM | POA: Diagnosis present

## 2023-01-13 DIAGNOSIS — I10 Essential (primary) hypertension: Secondary | ICD-10-CM | POA: Diagnosis present

## 2023-01-13 DIAGNOSIS — E119 Type 2 diabetes mellitus without complications: Secondary | ICD-10-CM

## 2023-01-13 DIAGNOSIS — Z888 Allergy status to other drugs, medicaments and biological substances status: Secondary | ICD-10-CM | POA: Diagnosis not present

## 2023-01-13 DIAGNOSIS — Y92009 Unspecified place in unspecified non-institutional (private) residence as the place of occurrence of the external cause: Secondary | ICD-10-CM | POA: Diagnosis not present

## 2023-01-13 DIAGNOSIS — W06XXXA Fall from bed, initial encounter: Secondary | ICD-10-CM | POA: Diagnosis present

## 2023-01-13 DIAGNOSIS — E1122 Type 2 diabetes mellitus with diabetic chronic kidney disease: Secondary | ICD-10-CM | POA: Diagnosis present

## 2023-01-13 DIAGNOSIS — F419 Anxiety disorder, unspecified: Secondary | ICD-10-CM | POA: Diagnosis present

## 2023-01-13 DIAGNOSIS — Z992 Dependence on renal dialysis: Secondary | ICD-10-CM | POA: Diagnosis not present

## 2023-01-13 DIAGNOSIS — Z6835 Body mass index (BMI) 35.0-35.9, adult: Secondary | ICD-10-CM

## 2023-01-13 DIAGNOSIS — Z8711 Personal history of peptic ulcer disease: Secondary | ICD-10-CM

## 2023-01-13 DIAGNOSIS — Z8249 Family history of ischemic heart disease and other diseases of the circulatory system: Secondary | ICD-10-CM

## 2023-01-13 DIAGNOSIS — F32A Depression, unspecified: Secondary | ICD-10-CM | POA: Diagnosis present

## 2023-01-13 DIAGNOSIS — R748 Abnormal levels of other serum enzymes: Secondary | ICD-10-CM | POA: Diagnosis present

## 2023-01-13 DIAGNOSIS — Z91158 Patient's noncompliance with renal dialysis for other reason: Secondary | ICD-10-CM

## 2023-01-13 DIAGNOSIS — Z7985 Long-term (current) use of injectable non-insulin antidiabetic drugs: Secondary | ICD-10-CM

## 2023-01-13 LAB — RENAL FUNCTION PANEL
Albumin: 3.2 g/dL — ABNORMAL LOW (ref 3.5–5.0)
Anion gap: 15 (ref 5–15)
BUN: 36 mg/dL — ABNORMAL HIGH (ref 8–23)
CO2: 23 mmol/L (ref 22–32)
Calcium: 8.4 mg/dL — ABNORMAL LOW (ref 8.9–10.3)
Chloride: 93 mmol/L — ABNORMAL LOW (ref 98–111)
Creatinine, Ser: 6.71 mg/dL — ABNORMAL HIGH (ref 0.44–1.00)
GFR, Estimated: 7 mL/min — ABNORMAL LOW (ref >60)
Glucose, Bld: 449 mg/dL — ABNORMAL HIGH (ref 70–99)
Phosphorus: 6.8 mg/dL — ABNORMAL HIGH (ref 2.5–4.6)
Potassium: 5.1 mmol/L (ref 3.5–5.1)
Sodium: 131 mmol/L — ABNORMAL LOW (ref 135–145)

## 2023-01-13 LAB — CBC WITH DIFFERENTIAL/PLATELET
Abs Immature Granulocytes: 0.15 10*3/uL — ABNORMAL HIGH (ref 0.00–0.07)
Basophils Absolute: 0 10*3/uL (ref 0.0–0.1)
Basophils Relative: 0 %
Eosinophils Absolute: 0.3 10*3/uL (ref 0.0–0.5)
Eosinophils Relative: 1 %
HCT: 30.9 % — ABNORMAL LOW (ref 36.0–46.0)
Hemoglobin: 10.8 g/dL — ABNORMAL LOW (ref 12.0–15.0)
Immature Granulocytes: 1 %
Lymphocytes Relative: 6 %
Lymphs Abs: 1.3 10*3/uL (ref 0.7–4.0)
MCH: 29.7 pg (ref 26.0–34.0)
MCHC: 35 g/dL (ref 30.0–36.0)
MCV: 84.9 fL (ref 80.0–100.0)
Monocytes Absolute: 0.9 10*3/uL (ref 0.1–1.0)
Monocytes Relative: 5 %
Neutro Abs: 17.8 10*3/uL — ABNORMAL HIGH (ref 1.7–7.7)
Neutrophils Relative %: 87 %
Platelets: 235 10*3/uL (ref 150–400)
RBC: 3.64 MIL/uL — ABNORMAL LOW (ref 3.87–5.11)
RDW: 17.8 % — ABNORMAL HIGH (ref 11.5–15.5)
WBC: 20.4 10*3/uL — ABNORMAL HIGH (ref 4.0–10.5)
nRBC: 0.1 % (ref 0.0–0.2)

## 2023-01-13 LAB — CK: Total CK: 880 U/L — ABNORMAL HIGH (ref 38–234)

## 2023-01-13 LAB — HIV ANTIBODY (ROUTINE TESTING W REFLEX): HIV Screen 4th Generation wRfx: NONREACTIVE

## 2023-01-13 LAB — HEMOGLOBIN A1C
Hgb A1c MFr Bld: 7 % — ABNORMAL HIGH (ref 4.8–5.6)
Mean Plasma Glucose: 154.2 mg/dL

## 2023-01-13 LAB — MAGNESIUM: Magnesium: 1.8 mg/dL (ref 1.7–2.4)

## 2023-01-13 LAB — GLUCOSE, CAPILLARY
Glucose-Capillary: 424 mg/dL — ABNORMAL HIGH (ref 70–99)
Glucose-Capillary: 345 mg/dL — ABNORMAL HIGH (ref 70–99)

## 2023-01-13 LAB — HEPATITIS B SURFACE ANTIGEN: Hepatitis B Surface Ag: NONREACTIVE

## 2023-01-13 LAB — MRSA NEXT GEN BY PCR, NASAL: MRSA by PCR Next Gen: NOT DETECTED

## 2023-01-13 MED ORDER — ORAL CARE MOUTH RINSE: 15.0000 mL | OROMUCOSAL | Status: DC | PRN

## 2023-01-13 MED ORDER — ONDANSETRON HCL 4 MG/2ML IJ SOLN: 4.0000 mg | Freq: Four times a day (QID) | INTRAMUSCULAR | Status: DC | PRN

## 2023-01-13 MED ORDER — INSULIN GLARGINE-YFGN 100 UNIT/ML ~~LOC~~ SOLN
15.0000 [IU] | Freq: Every day | SUBCUTANEOUS | Status: DC
  Administered 2023-01-13: 15 [IU] via SUBCUTANEOUS
  Filled 2023-01-13 (×2): qty 0.15

## 2023-01-13 MED ORDER — LABETALOL HCL 200 MG PO TABS
300.0000 mg | ORAL_TABLET | Freq: Two times a day (BID) | ORAL | Status: DC
  Administered 2023-01-13 – 2023-01-15 (×5): 300 mg via ORAL
  Filled 2023-01-13 (×5): qty 2

## 2023-01-13 MED ORDER — FUROSEMIDE 40 MG PO TABS
80.0000 mg | ORAL_TABLET | Freq: Every day | ORAL | Status: DC
  Administered 2023-01-13 – 2023-01-16 (×4): 80 mg via ORAL
  Filled 2023-01-13 (×4): qty 2

## 2023-01-13 MED ORDER — FAMOTIDINE 20 MG PO TABS
20.0000 mg | ORAL_TABLET | Freq: Every day | ORAL | Status: DC
  Administered 2023-01-14 – 2023-01-16 (×3): 20 mg via ORAL
  Filled 2023-01-13 (×3): qty 1

## 2023-01-13 MED ORDER — INSULIN ASPART 100 UNIT/ML IJ SOLN
0.0000 [IU] | Freq: Three times a day (TID) | INTRAMUSCULAR | Status: DC
  Administered 2023-01-14 (×2): 5 [IU] via SUBCUTANEOUS
  Administered 2023-01-15: 18 [IU] via SUBCUTANEOUS
  Administered 2023-01-15: 5 [IU] via SUBCUTANEOUS
  Administered 2023-01-16: 3 [IU] via SUBCUTANEOUS

## 2023-01-13 MED ORDER — NITROGLYCERIN 0.4 MG SL SUBL: 0.4000 mg | SUBLINGUAL_TABLET | SUBLINGUAL | Status: DC | PRN

## 2023-01-13 MED ORDER — CLONIDINE HCL 0.1 MG PO TABS
0.2000 mg | ORAL_TABLET | Freq: Two times a day (BID) | ORAL | Status: DC
  Administered 2023-01-13 – 2023-01-16 (×7): 0.2 mg via ORAL
  Filled 2023-01-13 (×7): qty 2

## 2023-01-13 MED ORDER — ACETAMINOPHEN 650 MG RE SUPP: 650.0000 mg | Freq: Four times a day (QID) | RECTAL | Status: DC | PRN

## 2023-01-13 MED ORDER — LIDOCAINE 5 % EX PTCH
1.0000 | MEDICATED_PATCH | CUTANEOUS | Status: DC
  Administered 2023-01-13 – 2023-01-15 (×3): 1 via TRANSDERMAL
  Filled 2023-01-13 (×3): qty 1

## 2023-01-13 MED ORDER — ONDANSETRON HCL 4 MG PO TABS: 4.0000 mg | ORAL_TABLET | Freq: Four times a day (QID) | ORAL | Status: DC | PRN

## 2023-01-13 MED ORDER — ALBUTEROL SULFATE (2.5 MG/3ML) 0.083% IN NEBU: 2.0000 mg | INHALATION_SOLUTION | RESPIRATORY_TRACT | Status: DC | PRN

## 2023-01-13 MED ORDER — ISOSORB DINITRATE-HYDRALAZINE 20-37.5 MG PO TABS
1.0000 | ORAL_TABLET | Freq: Three times a day (TID) | ORAL | Status: DC
Start: 2023-01-13 — End: 2023-01-16
  Administered 2023-01-13 – 2023-01-16 (×9): 1 via ORAL
  Filled 2023-01-13 (×9): qty 1

## 2023-01-13 MED ORDER — INSULIN ASPART 100 UNIT/ML IJ SOLN
10.0000 [IU] | Freq: Once | INTRAMUSCULAR | Status: AC
  Administered 2023-01-13: 10 [IU] via SUBCUTANEOUS

## 2023-01-13 MED ORDER — GABAPENTIN 100 MG PO CAPS
100.0000 mg | ORAL_CAPSULE | Freq: Every day | ORAL | Status: DC
  Administered 2023-01-13 – 2023-01-16 (×4): 100 mg via ORAL
  Filled 2023-01-13 (×4): qty 1

## 2023-01-13 MED ORDER — ACETAMINOPHEN 325 MG PO TABS
650.0000 mg | ORAL_TABLET | Freq: Four times a day (QID) | ORAL | Status: DC | PRN
  Administered 2023-01-13 – 2023-01-16 (×8): 650 mg via ORAL
  Filled 2023-01-13 (×8): qty 2

## 2023-01-13 MED ORDER — SENNOSIDES-DOCUSATE SODIUM 8.6-50 MG PO TABS: 1.0000 | ORAL_TABLET | Freq: Every evening | ORAL | Status: DC | PRN

## 2023-01-13 MED ORDER — CHLORHEXIDINE GLUCONATE CLOTH 2 % EX PADS
6.0000 | MEDICATED_PAD | Freq: Every day | CUTANEOUS | Status: DC
  Administered 2023-01-14 – 2023-01-15 (×2): 6 via TOPICAL

## 2023-01-13 MED ORDER — HEPARIN SODIUM (PORCINE) 5000 UNIT/ML IJ SOLN
5000.0000 [IU] | Freq: Three times a day (TID) | INTRAMUSCULAR | Status: DC
  Administered 2023-01-13 – 2023-01-16 (×9): 5000 [IU] via SUBCUTANEOUS
  Filled 2023-01-13 (×9): qty 1

## 2023-01-13 MED ORDER — SODIUM CHLORIDE 0.9 % IV SOLN
100.0000 mg | Freq: Three times a day (TID) | INTRAVENOUS | Status: DC
  Administered 2023-01-13 – 2023-01-14 (×2): 100 mg via INTRAVENOUS
  Filled 2023-01-13 (×5): qty 2

## 2023-01-13 MED ORDER — ORAL CARE MOUTH RINSE
15.0000 mL | OROMUCOSAL | Status: DC
  Administered 2023-01-13 – 2023-01-14 (×10): 15 mL via OROMUCOSAL

## 2023-01-13 MED ORDER — CYCLOBENZAPRINE HCL 10 MG PO TABS
10.0000 mg | ORAL_TABLET | Freq: Two times a day (BID) | ORAL | Status: DC | PRN
  Administered 2023-01-13 – 2023-01-15 (×5): 10 mg via ORAL
  Filled 2023-01-13 (×5): qty 1

## 2023-01-13 MED ORDER — FAMOTIDINE 20 MG PO TABS
40.0000 mg | ORAL_TABLET | Freq: Two times a day (BID) | ORAL | Status: DC
  Administered 2023-01-13: 40 mg via ORAL
  Filled 2023-01-13: qty 2

## 2023-01-13 MED ORDER — INSULIN ASPART 100 UNIT/ML IJ SOLN
0.0000 [IU] | Freq: Every day | INTRAMUSCULAR | Status: DC
  Administered 2023-01-13: 4 [IU] via SUBCUTANEOUS
  Administered 2023-01-15: 2 [IU] via SUBCUTANEOUS

## 2023-01-13 NOTE — Plan of Care (Signed)
Mental status improving, no s/s seizures.

## 2023-01-13 NOTE — Consult Note (Signed)
NEUROLOGY CONSULT NOTE   Date of service: January 13, 2023 Patient Name: [name] [name] MRN:  [id number] DOB:  05-23-1960 Chief Complaint: "seizures, periods of increased somnolence" Requesting Provider: Steffanie Rainwater, MD  History of Present Illness  [name] [name] is a 62 yo remale with PMH of ESRD on HD (M/W/F), GERD with previous ulcer, anxiety, recent foot fracture, T2DM, HTN, recurrent UTI, and hepatitis C.  According to records, patient did not have her HD on Wednesday or Friday of this past week due to leaving because she did not want to get sick and others were coughing. She was found by spouse to be twitching on Friday evening. Per the spouse, she initially was talking to him and having L face and L hemibody jerks and then fell face first onto ground and began to have full body jerks.  She returned to baseline. She was taken to [facility] ED by EMS.  She had additional seizure shortly after arrival and has been drowsy since. Confirmed no seizure history. No recent falls or trauma prior to seizure.  No new medications that the husband is aware of.  The patient was noted to have left arm soreness and underwent Xray of the arm which per the husband was neg for fracture.  At [facility], she also underwent rEEG and teleneurology w/u.  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  She was loaded with fosphenytoin and started on 100 mg TID.  Concern was for persistent drowsiness and subclinical seizures.  ROS  Comprehensive ROS performed and pertinent positives documented in HPI   Past History   Past Medical History:  Diagnosis Date   + 02/17/2019   treated   Abdominal pain 08/13/2016   Abdominal pain, unspecified site 07/17/2013   Abnormal EKG 08/29/2013   Acute gastric ulcer without hemorrhage or perforation    AKI (acute kidney injury) (HCC) 11/15/2016   Anemia    Anxiety    ARTHRITIS 03/10/2008   Qualifier:  Diagnosis of  By: Creta Levin CMA (AAMA), Robin     Asthma    Atypical chest pain 03/09/2009   Qualifier: Diagnosis of  By: Monica Becton PA-c, Amy S    BRONCHITIS, CHRONIC 03/10/2008   Qualifier: Diagnosis of  By: Creta Levin CMA (AAMA), Robin     Chest pain 08/28/2013   Chronic lower back pain    10-27-13 not a problem at this time   CKD (chronic kidney disease), stage III (HCC) 10/17/2016   CKD (chronic kidney disease), stage V (HCC) 07/02/2019   Closed fracture of coccyx (HCC) 02/17/2019   Closed fracture of phalanx of foot 02/17/2019   Corns and callosities 08/13/2016   Depression    Diabetic neuropathy associated with type 2 diabetes mellitus (HCC) 12/25/2015   Diarrhea 07/17/2013   DM (diabetes mellitus), type 2 with neurological complications (HCC) 09/05/2009   Qualifier: Diagnosis of  By: Myrtie Hawk, Amy S    Dyskinesia of esophagus 03/10/2008   Centricity Description: ESOPHAGEAL SPASM Qualifier: Diagnosis of  By: Creta Levin CMA (Duncan Dull), Robin   Centricity Description: ESOPHAGEAL MOTILITY DISORDER Qualifier: Diagnosis of  By: Creta Levin CMA (AAMA), Robin     Dysphagia 03/09/2009   Qualifier: Diagnosis of  By: Myrtie Hawk, Amy S    End stage renal disease (HCC) 02/17/2019   Essential hypertension 03/10/2008   Qualifier: Diagnosis of  By: Creta Levin CMA (AAMA), Robin     Frequent UTI    Gastritis 08/13/2016  GERD (gastroesophageal reflux disease)    Heart murmur    in Dr. Tomie China notes   HEPATITIS C, CHRONIC 03/10/2008   Qualifier: Diagnosis of  By: Creta Levin CMA (AAMA), Robin     High cholesterol    History of blood transfusion    Hypertensive crisis 10/17/2021   Hypertensive emergency 07/03/2019   Hypertensive renal disease 02/17/2019   Hypertensive urgency 08/28/2013   Ileus (HCC) 10/21/2021   Impingement syndrome of left shoulder region 02/17/2019   Lumbar radiculopathy 07/25/2018   Added automatically from request for surgery [id number]   Microscopic hematuria  10/17/2016   Migraine    "monthly" (08/28/2013)   NAUSEA AND VOMITING 12/20/2008   Qualifier: Diagnosis of  By: Dorian Pod, Pam     NAUSEA AND VOMITING 12/20/2008   Pneumonia    "several times"   Type 2 diabetes mellitus (HCC) 07/02/2019    Past Surgical History:  Procedure Laterality Date   ABDOMINAL HYSTERECTOMY     APPENDECTOMY     AV FISTULA PLACEMENT Left 05/19/2019   Procedure: LEFT ARM Brachiocephalic ARTERIOVENOUS (AV) FISTULA CREATION;  Surgeon: Maeola Harman, MD;  Location: [facility] OR;  Service: Vascular;  Laterality: Left;   AV FISTULA PLACEMENT Left 07/23/2019   Procedure: LEFT ARM ARTERIOVENOUS (AV) FISTULA CREATION;  Surgeon: Maeola Harman, MD;  Location: [facility] OR;  Service: Vascular;  Laterality: Left;   BALLOON DILATION N/A 12/29/2012   Procedure: Marvis Repress DILATION;  Surgeon: Louis Meckel, MD;  Location: [facility]COPY;  Service: Endoscopy;  Laterality: N/A;   BALLOON DILATION N/A 06/25/2013   Procedure: BALLOON DILATION;  Surgeon: Louis Meckel, MD;  Location: [facility];  Service: Endoscopy;  Laterality: N/A;   BASCILIC VEIN TRANSPOSITION Left 09/24/2019   Procedure: SECOND STAGE LEFT BASCILIC VEIN TRANSPOSITION;  Surgeon: Maeola Harman, MD;  Location: [facility] OR;  Service: Vascular;  Laterality: Left;   BOTOX INJECTION  12/29/2012   Procedure: BOTOX INJECTION;  Surgeon: Louis Meckel, MD;  Location: [facility];  Service: Endoscopy;;   BOTOX INJECTION N/A 06/25/2013   Procedure: BOTOX INJECTION;  Surgeon: Louis Meckel, MD;  Location: [facility];  Service: Endoscopy;  Laterality: N/A;   CAPD INSERTION N/A 03/20/2022   Procedure: LAPAROSCOPIC INSERTION CONTINUOUS AMBULATORY PERITONEAL DIALYSIS  (CAPD) CATHETER;  Surgeon: Maeola Harman, MD;  Location: [facility] OR;  Service: Vascular;  Laterality: N/A;   CAPD REMOVAL N/A 05/04/2022   Procedure: REMOVAL CONTINUOUS AMBULATORY PERITONEAL DIALYSIS  (CAPD) CATHETER;  Surgeon: Chuck Hint, MD;  Location: [facility] OR;  Service: Vascular;  Laterality: N/A;   CARDIAC CATHETERIZATION  X 2   [name] [name] 08/28/2013   CARPAL TUNNEL RELEASE Right    CHOLECYSTECTOMY     COLONOSCOPY  04/10/2012   Normal    ESOPHAGEAL MANOMETRY N/A 12/26/2015   Procedure: ESOPHAGEAL MANOMETRY (EM);  Surgeon: Sherrilyn Rist, MD;  Location: [facility];  Service: Gastroenterology;  Laterality: N/A;   ESOPHAGOGASTRODUODENOSCOPY N/A 12/29/2012   Procedure: ESOPHAGOGASTRODUODENOSCOPY (EGD);  Surgeon: Louis Meckel, MD;  Location: [facility];  Service: Endoscopy;  Laterality: N/A;   ESOPHAGOGASTRODUODENOSCOPY N/A 06/25/2013   Procedure: ESOPHAGOGASTRODUODENOSCOPY (EGD);  Surgeon: Louis Meckel, MD;  Location: [facility];  Service: Endoscopy;  Laterality: N/A;   ESOPHAGOGASTRODUODENOSCOPY N/A 11/03/2013   Procedure: ESOPHAGOGASTRODUODENOSCOPY (EGD);  Surgeon: Louis Meckel, MD;  Location: [facility];  Service: Endoscopy;  Laterality: N/A;  Botox - ballon dilation   ESOPHAGOGASTRODUODENOSCOPY N/A 08/14/2016   Procedure: ESOPHAGOGASTRODUODENOSCOPY (EGD);  Surgeon: Rachael Fee,  MD;  Location: [facility] ENDOSCOPY;  Service: Endoscopy;  Laterality: N/A;   FOOT FRACTURE SURGERY Right    HEEL SPUR EXCISION Right    TONSILLECTOMY     TUBAL LIGATION      Family History: Family History  Problem Relation Age of Onset   Hypertension Mother    Alzheimer's disease Father    Throat cancer Sister 73       died at age 2   Diabetes Brother    Colon cancer Neg Hx    Rectal cancer Neg Hx    Stomach cancer Neg Hx     Social History  reports that she has never smoked. She has never used smokeless tobacco. She reports that she does not drink alcohol and does not use drugs.  Allergies  Allergen Reactions   Hydrochlorothiazide Itching   Amlodipine     Foot swelling/chest pain    Duloxetine     Hallucinations    Lisinopril Cough   Metformin     Unknown reaction   Codeine Itching    No hives or  rash, takes benadryl   Dilaudid [Hydromorphone Hcl] Itching    "mild itching hrs after dilaudid given"    Medications   Current Facility-Administered Medications:    acetaminophen (TYLENOL) tablet 650 mg, 650 mg, Oral, Q6H PRN, 650 mg at 01/13/23 1304 **OR** acetaminophen (TYLENOL) suppository 650 mg, 650 mg, Rectal, Q6H PRN, Kirke Corin, Flossie Buffy, MD   albuterol (PROVENTIL) (2.5 MG/3ML) 0.083% nebulizer solution 2 mg, 2 mg, Inhalation, Q4H PRN, Steffanie Rainwater, MD   cloNIDine (CATAPRES) tablet 0.2 mg, 0.2 mg, Oral, BID, Steffanie Rainwater, MD, 0.2 mg at 01/13/23 1304   cyclobenzaprine (FLEXERIL) tablet 10 mg, 10 mg, Oral, BID PRN, Steffanie Rainwater, MD, 10 mg at 01/13/23 1415   famotidine (PEPCID) tablet 40 mg, 40 mg, Oral, BID, Steffanie Rainwater, MD, 40 mg at 01/13/23 1304   furosemide (LASIX) tablet 80 mg, 80 mg, Oral, Daily, Steffanie Rainwater, MD, 80 mg at 01/13/23 1304   gabapentin (NEURONTIN) capsule 100 mg, 100 mg, Oral, Daily, Steffanie Rainwater, MD, 100 mg at 01/13/23 1415   heparin injection 5,000 Units, 5,000 Units, Subcutaneous, Q8H, [place], Flossie Buffy, MD, 5,000 Units at 01/13/23 1418   isosorbide-hydrALAZINE (BIDIL) 20-37.5 MG per tablet 1 tablet, 1 tablet, Oral, TID, Steffanie Rainwater, MD, 1 tablet at 01/13/23 1304   labetalol (NORMODYNE) tablet 300 mg, 300 mg, Oral, BID, Steffanie Rainwater, MD, 300 mg at 01/13/23 1303   nitroGLYCERIN (NITROSTAT) SL tablet 0.4 mg, 0.4 mg, Sublingual, Q5 min PRN, Kirke Corin, Flossie Buffy, MD   ondansetron (ZOFRAN) tablet 4 mg, 4 mg, Oral, Q6H PRN **OR** ondansetron (ZOFRAN) injection 4 mg, 4 mg, Intravenous, Q6H PRN, Kirke Corin, Flossie Buffy, MD   Oral care mouth rinse, 15 mL, Mouth Rinse, Q2H, [place], Flossie Buffy, MD, 15 mL at 01/13/23 1416   Oral care mouth rinse, 15 mL, Mouth Rinse, PRN, Kirke Corin, Flossie Buffy, MD   senna-docusate (Senokot-S) tablet 1 tablet, 1 tablet, Oral, QHS PRN, Kirke Corin, Flossie Buffy, MD  Facility-Administered Medications  Ordered in Other Encounters:    0.9 %  sodium chloride infusion, , Intravenous, Continuous PRN, Carmela Rima, CRNA, New Bag at 07/02/19 1304  Vitals   Vitals:   01/13/23 0940 01/13/23 0943 01/13/23 1000 01/13/23 1406  BP: (!) 161/73  (!) 187/80 (!) 172/79  Pulse: (!) 109  (!) 109 (!) 102  Resp: 19  19 20   Temp: 98.9 F (37.2 C)  TempSrc: Oral     SpO2: 93% 93% 96% 98%  Weight: 97.9 kg     Height: 5\' 5"  (1.651 m)       Body mass index is 35.92 kg/m.  Physical Exam   Constitutional: Appears well-developed and well-nourished.  Psych: Affect appropriate to situation when aroused for conversation.  Eyes: No scleral injection.  Head: Normocephalic. Atraumatic Cardiovascular: Normal rate and regular rhythm. Tachy rate at times. Respiratory: Effort normal, non-labored breathing.  GI: Soft.  No distension.  Skin: WDI.   Neurologic Examination   Patient is drowsy.  She is arousable to tactile stimulus.  Once stimulated she is oriented to person, place and time.  She has fluent speech without dysarthria. Conjugate gaze. EOMI. Face is symmetric.  Tongue is midline.  Sensation is equal B/L to face.  Hearing is intact to voice.  Muscles have normal tone and bulk with 5/5 strength throughout although she is sore when trying to move LUE and BLE.   Sensation is intact BUE, BLE to light touch. Finger to nose is intact.  She is able to perform rapid alternating movements. Gait not observed.  Labs/Imaging/Neurodiagnostic studies   CBC:  Recent Labs  Lab January 30, 2023 1357  WBC 20.4*  NEUTROABS 17.8*  HGB 10.8*  HCT 30.9*  MCV 84.9  PLT 235   Basic Metabolic Panel:  Lab Results  Component Value Date   NA 136 05/04/2022   K 4.5 05/04/2022   CO2 26 10/20/2021   GLUCOSE 64 (L) 05/04/2022   BUN 50 (H) 05/04/2022   CREATININE 7.50 (H) 05/04/2022   CALCIUM 9.3 10/20/2021   GFRNONAA 6 (L) 10/20/2021   GFRAA 12 (L) 08/18/2019   Lipid Panel:  Lab Results  Component Value Date    LDLCALC 142 (H) 08/28/2013   HgbA1c:  Lab Results  Component Value Date   HGBA1C 8.2 (H) 10/17/2021   Urine Drug Screen: No results found for: "LABOPIA", "COCAINSCRNUR", "LABBENZ", "AMPHETMU", "THCU", "LABBARB"  Alcohol Level No results found for: "ETH" INR  Lab Results  Component Value Date   INR 1.0 10/17/2021   APTT  Lab Results  Component Value Date   APTT 25 10/22/2013   AED levels: No results found for: "PHENYTOIN", "ZONISAMIDE", "LAMOTRIGINE", "LEVETIRACETA"  MRI brain  pending  ASSESSMENT   Shonita Ackroyd is 62 yo female with PMH of ESRD on HD, HTN, DM2, depression, GERD with previous ulcer, IDA, hx of Hep C, and HLD. Presents to [employer] as transfer from [facility] for new onset seizure in the setting of missed HD doses x 2. Althou seizures could have been due to electrolyte imbalance/poor clearance of toxins in the setting of missed HD, rEEG at [facility] with R temporal sharps and along with noted periods of drowsiness and difficult waking up at times, sent to [facility] for cEEG.  RECOMMENDATIONS  - cEEG (ordered) - MRI brain w/o (ordered) to assess for underlying structural abnormality or other cause for seizure (R temporal sharps) - seizure precautions. - Avoid neurotoxic meds - Correct underlying metabolic disturbances. - recommend normothermia and euglycemia. - further recs pending test results. - Continue fosphenytoin for now, however if no seizures noted on EEG may need to change to less sedating medication. - Phenytoin levels in AM. ______________________________________________________________________   Signed, Wynell Balloon, NP [facility]   NEUROHOSPITALIST ADDENDUM Performed a face to face diagnostic evaluation.   I have reviewed the contents of history and physical exam as documented by PA/ARNP/Resident and agree with above documentation.  I have discussed and formulated the above plan as documented. Edits to the note have been  made as needed.  Erick Blinks, MD [facility] [phone]   If 7pm to 7am, please call on call as listed on AMION.

## 2023-01-13 NOTE — Progress Notes (Signed)
EEG LTM with MRI compatible leads. [facility] monitoring. Test button tested.

## 2023-01-13 NOTE — H&P (Signed)
History and Physical    Patient: [name] [name] GNF:[id number] DOB: 1960/06/27 DOA: 01/13/2023 DOS: the patient was seen and examined on 01/13/2023 PCP: Shelle Iron, MD  Patient coming from: [facility]  Chief Complaint: Seizure  HPI: [name] [name] is a 62 y.o. female with medical history significant of HTN, T2DM, ESRD on HD MWF, depression, GERD and HLD who was was transferred from [facility] to [facility] for further evaluation of seizure.  Friday evening, spouse reports that he found patient twitching while in bed.  Afterwards, she was slow to response.  He called EMS and on EMS arrival, patient had a seizure episode where her face and arm started twitching causing her to fall off the bed.  Patient was transported to the [facility] ED and on arrival patient had another seizure episode when she bit her tongue. Patient reports that she missed her HD sessions on Wednesday and Friday because at the HD center, she noticed other patients were coughing so she left the HD center both days because she did not want to get sick. During my evaluation, patient reports diffuse muscle aches but denies any dizziness, headaches, vision changes, chest pain, shortness of breath or abdominal pain. She denies any history of seizures.  I am unable to see records from [facility] however according to neurologist, patient underwent HD at [facility] x 2 and had a routine EEG which was notable for right temporal sharps. CTH neg. teleneurologist was consulted and 1 hour of  rEEG also showed right temporal sharp waves. Patient was  loaded with fosphenytoin 1 started on phenytoin 100 mg 3 times daily. She remained post-ictal. They requested transfer to [facility] for continuous EEG.    Review of Systems: As mentioned in the history of present illness. All other systems reviewed and are negative. Past Medical History:  Diagnosis Date   + 02/17/2019   treated   Abdominal pain 08/13/2016   Abdominal pain,  unspecified site 07/17/2013   Abnormal EKG 08/29/2013   Acute gastric ulcer without hemorrhage or perforation    AKI (acute kidney injury) (HCC) 11/15/2016   Anemia    Anxiety    ARTHRITIS 03/10/2008   Qualifier: Diagnosis of  By: Creta Levin CMA (AAMA), Robin     Asthma    Atypical chest pain 03/09/2009   Qualifier: Diagnosis of  By: Monica Becton PA-c, Amy S    BRONCHITIS, CHRONIC 03/10/2008   Qualifier: Diagnosis of  By: Creta Levin CMA (AAMA), Robin     Chest pain 08/28/2013   Chronic lower back pain    10-27-13 not a problem at this time   CKD (chronic kidney disease), stage III (HCC) 10/17/2016   CKD (chronic kidney disease), stage V (HCC) 07/02/2019   Closed fracture of coccyx (HCC) 02/17/2019   Closed fracture of phalanx of foot 02/17/2019   Corns and callosities 08/13/2016   Depression    Diabetic neuropathy associated with type 2 diabetes mellitus (HCC) 12/25/2015   Diarrhea 07/17/2013   DM (diabetes mellitus), type 2 with neurological complications (HCC) 09/05/2009   Qualifier: Diagnosis of  By: Myrtie Hawk, Amy S    Dyskinesia of esophagus 03/10/2008   Centricity Description: ESOPHAGEAL SPASM Qualifier: Diagnosis of  By: Creta Levin CMA Duncan Dull), Robin   Centricity Description: ESOPHAGEAL MOTILITY DISORDER Qualifier: Diagnosis of  By: Creta Levin CMA (AAMA), Robin     Dysphagia 03/09/2009   Qualifier: Diagnosis of  By: Myrtie Hawk, Amy S    End stage renal disease (HCC) 02/17/2019   Essential hypertension  03/10/2008   Qualifier: Diagnosis of  By: Creta Levin CMA (AAMA), Robin     Frequent UTI    Gastritis 08/13/2016   GERD (gastroesophageal reflux disease)    Heart murmur    in Dr. Tomie China notes   HEPATITIS C, CHRONIC 03/10/2008   Qualifier: Diagnosis of  By: Creta Levin CMA (AAMA), Robin     High cholesterol    History of blood transfusion    Hypertensive crisis 10/17/2021   Hypertensive emergency 07/03/2019   Hypertensive renal disease 02/17/2019   Hypertensive urgency  08/28/2013   Ileus (HCC) 10/21/2021   Impingement syndrome of left shoulder region 02/17/2019   Lumbar radiculopathy 07/25/2018   Added automatically from request for surgery [id number]   Microscopic hematuria 10/17/2016   Migraine    "monthly" (08/28/2013)   NAUSEA AND VOMITING 12/20/2008   Qualifier: Diagnosis of  By: Dorian Pod, Pam     NAUSEA AND VOMITING 12/20/2008   Pneumonia    "several times"   Type 2 diabetes mellitus (HCC) 07/02/2019   Past Surgical History:  Procedure Laterality Date   ABDOMINAL HYSTERECTOMY     APPENDECTOMY     AV FISTULA PLACEMENT Left 05/19/2019   Procedure: LEFT ARM Brachiocephalic ARTERIOVENOUS (AV) FISTULA CREATION;  Surgeon: Maeola Harman, MD;  Location: [facility] OR;  Service: Vascular;  Laterality: Left;   AV FISTULA PLACEMENT Left 07/23/2019   Procedure: LEFT ARM ARTERIOVENOUS (AV) FISTULA CREATION;  Surgeon: Maeola Harman, MD;  Location: [facility] OR;  Service: Vascular;  Laterality: Left;   BALLOON DILATION N/A 12/29/2012   Procedure: Marvis Repress DILATION;  Surgeon: Louis Meckel, MD;  Location: [facility] ENDOSCOPY;  Service: Endoscopy;  Laterality: N/A;   BALLOON DILATION N/A 06/25/2013   Procedure: BALLOON DILATION;  Surgeon: Louis Meckel, MD;  Location: [facility];  Service: Endoscopy;  Laterality: N/A;   BASCILIC VEIN TRANSPOSITION Left 09/24/2019   Procedure: SECOND STAGE LEFT BASCILIC VEIN TRANSPOSITION;  Surgeon: Maeola Harman, MD;  Location: [facility] OR;  Service: Vascular;  Laterality: Left;   BOTOX INJECTION  12/29/2012   Procedure: BOTOX INJECTION;  Surgeon: Louis Meckel, MD;  Location: [facility]OS[facility];  Service: Endoscopy;;   BOTOX INJECTION N/A 06/25/2013   Procedure: BOTOX INJECTION;  Surgeon: Louis Meckel, MD;  Location: [facility];  Service: Endoscopy;  Laterality: N/A;   CAPD INSERTION N/A 03/20/2022   Procedure: LAPAROSCOPIC INSERTION CONTINUOUS AMBULATORY PERITONEAL DIALYSIS  (CAPD) CATHETER;  Surgeon: Maeola Harman, MD;  Location: [facility] OR;  Service: Vascular;  Laterality: N/A;   CAPD REMOVAL N/A 05/04/2022   Procedure: REMOVAL CONTINUOUS AMBULATORY PERITONEAL DIALYSIS  (CAPD) CATHETER;  Surgeon: Chuck Hint, MD;  Location: [facility] OR;  Service: Vascular;  Laterality: N/A;   CARDIAC CATHETERIZATION  X 2   Hattie Perch 08/28/2013   CARPAL TUNNEL RELEASE Right    CHOLECYSTECTOMY     COLONOSCOPY  04/10/2012   Normal    ESOPHAGEAL MANOMETRY N/A 12/26/2015   Procedure: ESOPHAGEAL MANOMETRY (EM);  Surgeon: Sherrilyn Rist, MD;  Location: [facility];  Service: Gastroenterology;  Laterality: N/A;   ESOPHAGOGASTRODUODENOSCOPY N/A 12/29/2012   Procedure: ESOPHAGOGASTRODUODENOSCOPY (EGD);  Surgeon: Louis Meckel, MD;  Location: [facility];  Service: Endoscopy;  Laterality: N/A;   ESOPHAGOGASTRODUODENOSCOPY N/A 06/25/2013   Procedure: ESOPHAGOGASTRODUODENOSCOPY (EGD);  Surgeon: Louis Meckel, MD;  Location: [facility];  Service: Endoscopy;  Laterality: N/A;   ESOPHAGOGASTRODUODENOSCOPY N/A 11/03/2013   Procedure: ESOPHAGOGASTRODUODENOSCOPY (EGD);  Surgeon: Louis Meckel, MD;  Location: [facility];  Service:  Endoscopy;  Laterality: N/A;  Botox - ballon dilation   ESOPHAGOGASTRODUODENOSCOPY N/A 08/14/2016   Procedure: ESOPHAGOGASTRODUODENOSCOPY (EGD);  Surgeon: Rachael Fee, MD;  Location: [facility] ENDOSCOPY;  Service: Endoscopy;  Laterality: N/A;   FOOT FRACTURE SURGERY Right    HEEL SPUR EXCISION Right    TONSILLECTOMY     TUBAL LIGATION     Social History:  reports that she has never smoked. She has never used smokeless tobacco. She reports that she does not drink alcohol and does not use drugs.  Allergies  Allergen Reactions   Hydrochlorothiazide Itching   Amlodipine     Foot swelling/chest pain    Duloxetine     Hallucinations    Lisinopril Cough   Metformin     Unknown reaction   Codeine Itching    No hives or rash, takes benadryl   Dilaudid [Hydromorphone Hcl] Itching    "mild  itching hrs after dilaudid given"    Family History  Problem Relation Age of Onset   Hypertension Mother    Alzheimer's disease Father    Throat cancer Sister 2       died at age 40   Diabetes Brother    Colon cancer Neg Hx    Rectal cancer Neg Hx    Stomach cancer Neg Hx     Prior to Admission medications   Medication Sig Start Date End Date Taking? Authorizing Provider  acetaminophen (TYLENOL) 500 MG tablet Take 1,000 mg by mouth every 6 (six) hours as needed for mild pain or headache.   Yes [provider]  albuterol (VENTOLIN HFA) 108 (90 Base) MCG/ACT inhaler Inhale 2 puffs into the lungs every 4 (four) hours as needed for wheezing or shortness of breath.    Yes [provider]  amLODipine (NORVASC) 5 MG tablet Take 5 mg by mouth daily.   Yes [provider]  calcitRIOL (ROCALTROL) 0.5 MCG capsule Take 2 capsules (1 mcg total) by mouth every Monday, Wednesday, and Friday with hemodialysis. Patient taking differently: Take 1 mcg by mouth every Monday, Wednesday, and Friday with hemodialysis. Patient had Dialysis yesterday at [facility] 01/12/2023. 10/23/21  Yes Regalado, Belkys A, MD  cloNIDine (CATAPRES) 0.1 MG tablet Take 0.1-0.2 mg by mouth 2 (two) times daily. 12/19/22  Yes [provider]  cyclobenzaprine (FLEXERIL) 10 MG tablet Take 10 mg by mouth 2 (two) times daily as needed for muscle spasms. 03/21/21  Yes [provider]  famotidine (PEPCID) 40 MG tablet Take 1 tablet by mouth twice daily 06/11/22  Yes Lemmon, Violet Baldy, PA  furosemide (LASIX) 80 MG tablet Take 80 mg by mouth 2 (two) times daily. 07/17/19  Yes [provider]  HUMULIN N 100 UNIT/ML injection Inject 15 Units into the skin daily before breakfast. 06/26/18  Yes [provider]  ibuprofen (ADVIL) 600 MG tablet Take 600 mg by mouth every 6 (six) hours as needed. 10/29/22  Yes [provider]  isosorbide-hydrALAZINE (BIDIL) 20-37.5 MG tablet Take 1  tablet by mouth 3 (three) times daily. Patient taking differently: Take 1 tablet by mouth daily. 10/21/21  Yes Regalado, Belkys A, MD  labetalol (NORMODYNE) 100 MG tablet Take 100 mg by mouth daily. 05/04/21  Yes [provider]  lanthanum (FOSRENOL) 1000 MG chewable tablet Chew 1,000 mg by mouth 3 (three) times daily. 12/14/22  Yes [provider]  lidocaine (LIDODERM) 5 % Place 1 patch onto the skin daily. 12/27/22  Yes [provider]  nitroGLYCERIN (NITROSTAT) 0.4 MG  SL tablet Place 0.4 mg under the tongue every 5 (five) minutes as needed for chest pain. 01/01/23  Yes [provider]  OZEMPIC, 0.25 OR 0.5 MG/DOSE, 2 MG/3ML SOPN Inject 0.25 mg into the skin once a week. Monday 01/07/2023 last injection. 01/01/23  Yes [provider]  colestipol (COLESTID) 1 g tablet Take 0.5 g by mouth 2 (two) times daily.    [provider]  gabapentin (NEURONTIN) 300 MG capsule Take 1 capsule (300 mg total) by mouth 2 (two) times daily. 10/21/21   Alba Cory, MD    Physical Exam: Vitals:   01/13/23 0940 01/13/23 0943 01/13/23 1000  BP: (!) 161/73  (!) 187/80  Pulse: (!) 109  (!) 109  Resp: 19  19  Temp: 98.9 F (37.2 C)    TempSrc: Oral    SpO2: 93% 93% 96%  Weight: 97.9 kg    Height: 5\' 5"  (1.651 m)     General: Lethargic appearing obese female laying in bed. No acute distress. HEENT: [employer]/AT. Anicteric sclera. PERRLA. CV: Tachycardic. Regular rhythm. Soft systolic murmur. No LE edema Pulmonary: Lungs CTAB. Normal effort. No wheezing or rales. Abdominal: Soft, nontender, nondistended. Normal bowel sounds. Extremities: Palpable radial and DP pulses. LUE AVF with palpable thrill. Mild tenderness to palpation of the left shoulder. Skin: Warm and dry. No obvious rash or lesions. Neuro: A&Ox3. Moves all extremities. Normal sensation to light touch. No focal deficit. Psych: Normal mood and affect  Data Reviewed:  Pending  Assessment and  Plan:  PEREL ALPHIN is a 62 y.o. female with medical history significant of HTN, T2DM, ESRD on HD MWF, depression, GERD and HLD who was was transferred from [facility] to [facility] for further evaluation of seizure.  # New onset seizure ESRD patient with no history of seizure presented to outside hospital for evaluation of multiple seizures. She had a witnessed seizure at the ED and EEG showed right temporal sharps. CT head was negative. Labs do not show any metabolic or toxic cause of her new seizure. She has been transferred here for continuous EEG due to concern for subclinical seizures. Pending MRI brain to evaluate for structural causes. -Admit to progressive bed -Neurology following, appreciate recs -Pending MRI brain -Continuous EEG overnight -IV phenytoin 100 mg every 8 hours -Seizure precautions  # ESRD On HD MWF.  Patient missed her last 2 HD sessions on Wednesday and Friday. She receives HD at [facility] on Friday and Saturday. Renal function stable with K+ of 5.1, Phos 6.8. Patient on room air and does not look fluid overloaded. -Nephrology following, appreciate recs -Next HD tomorrow -Avoid nephrotoxic's agents -Renal diet  # HTN BP elevated with SBP in the 150s to 180s -Labetalol 300 mg twice daily -BiDil 1 tablet 3 times daily -Clonidine 0.2 mg twice daily  # T2DM Repeat A1c improved from 8.2% a year ago to 7.0%.  Home regimen includes Humulin N 15 units daily and Ozempic. Blood sugar significantly elevated on renal function panel 449. CBG elevated to 424 -NovoLog 10 units x 1 -Semglee 15 units at bedtime -SSI with meals, CBG monitoring  # Leukocytosis CBC showed WBC of 20.4. Patient is afebrile without any respiratory symptoms. Leukocytosis likely reactive in the setting of multiple seizures. Will consider getting a CXR to rule out aspiration pneumonia in the setting of her seizures if leukocytosis persist. -Follow-up morning CBC  # Muscle aches #  Elevated CK Patient reports diffuse muscle pain worse in her left shoulder  after his seizure episodes. X-ray of the left shoulder at outside facility did not reveal any fractures. CK elevated to 880.  Elevated CK and muscle aches all secondary to repeated seizures. -As needed Tylenol for pain -Flexeril 10 mg twice daily as needed for muscle spasm -Lidocaine patch, apply to left shoulder  # GERD -Protonix 40 mg daily   Advance Care Planning:   Code Status: Full Code   Consults: Neurology  Family Communication: Discussed admission with spouse at bedside  Severity of Illness: The appropriate patient status for this patient is INPATIENT. Inpatient status is judged to be reasonable and necessary in order to provide the required intensity of service to ensure the patient's safety. The patient's presenting symptoms, physical exam findings, and initial radiographic and laboratory data in the context of their chronic comorbidities is felt to place them at high risk for further clinical deterioration. Furthermore, it is not anticipated that the patient will be medically stable for discharge from the hospital within 2 midnights of admission.   * I certify that at the point of admission it is my clinical judgment that the patient will require inpatient hospital care spanning beyond 2 midnights from the point of admission due to high intensity of service, high risk for further deterioration and high frequency of surveillance required.*  Author: Steffanie Rainwater, MD 01/13/2023 1:03 PM  For on call review [email or web address].

## 2023-01-13 NOTE — Care Plan (Signed)
LTM eeg reviewed till 1730. No seizures. Please review final report for details.  Bern Fare Annabelle Harman

## 2023-01-13 NOTE — Consult Note (Signed)
[facility] Renal Consultation Note    Indication for Consultation:  Management of ESRD/hemodialysis; anemia, hypertension/volume and secondary hyperparathyroidism   HPI: [name] [name] is a 62 y.o. female with ESRD on HD MWF, DMT2, HTN, HLD, Hep C s/p  treatment who is admitted with new onset seizures. Seizures happened Friday evening after she missed dialysis on Wednesday and Friday. She was admitted to [facility] that evening and had another seizure there. Per notes anti seizure meds started. Head CT negative. Routine EEG abnormal and she was transferred to [facility] or further neuro evaluation. MRI brain ordered here. Labs notable for elevated WBCs. Chemistries pending.  Last outpatient dialysis was Monday 11/25. She did have dialysis at least once while at [place] this weekend. She does occasionally miss treatments.  Husband at bedside providing most of history. He denies any history of seizures. Says he will make sure she doesn't miss anymore dialysis treatments. She does wake briefly from sleeping  - has no particular complaints.    Past Medical History:  Diagnosis Date   + 02/17/2019   treated   Abdominal pain 08/13/2016   Abdominal pain, unspecified site 07/17/2013   Abnormal EKG 08/29/2013   Acute gastric ulcer without hemorrhage or perforation    AKI (acute kidney injury) (HCC) 11/15/2016   Anemia    Anxiety    ARTHRITIS 03/10/2008   Qualifier: Diagnosis of  By: Creta Levin CMA (AAMA), Robin     Asthma    Atypical chest pain 03/09/2009   Qualifier: Diagnosis of  By: Monica Becton PA-c, Amy S    BRONCHITIS, CHRONIC 03/10/2008   Qualifier: Diagnosis of  By: Creta Levin CMA (AAMA), Robin     Chest pain 08/28/2013   Chronic lower back pain    10-27-13 not a problem at this time   CKD (chronic kidney disease), stage III (HCC) 10/17/2016   CKD (chronic kidney disease), stage V (HCC) 07/02/2019   Closed fracture of coccyx (HCC) 02/17/2019   Closed fracture of phalanx  of foot 02/17/2019   Corns and callosities 08/13/2016   Depression    Diabetic neuropathy associated with type 2 diabetes mellitus (HCC) 12/25/2015   Diarrhea 07/17/2013   DM (diabetes mellitus), type 2 with neurological complications (HCC) 09/05/2009   Qualifier: Diagnosis of  By: Myrtie Hawk, Amy S    Dyskinesia of esophagus 03/10/2008   Centricity Description: ESOPHAGEAL SPASM Qualifier: Diagnosis of  By: Creta Levin CMA (Duncan Dull), Robin   Centricity Description: ESOPHAGEAL MOTILITY DISORDER Qualifier: Diagnosis of  By: Creta Levin CMA (AAMA), Robin     Dysphagia 03/09/2009   Qualifier: Diagnosis of  By: Myrtie Hawk, Amy S    End stage renal disease (HCC) 02/17/2019   Essential hypertension 03/10/2008   Qualifier: Diagnosis of  By: Creta Levin CMA (AAMA), Robin     Frequent UTI    Gastritis 08/13/2016   GERD (gastroesophageal reflux disease)    Heart murmur    in Dr. Tomie China notes   HEPATITIS C, CHRONIC 03/10/2008   Qualifier: Diagnosis of  By: Creta Levin CMA (AAMA), Robin     High cholesterol    History of blood transfusion    Hypertensive crisis 10/17/2021   Hypertensive emergency 07/03/2019   Hypertensive renal disease 02/17/2019   Hypertensive urgency 08/28/2013   Ileus (HCC) 10/21/2021   Impingement syndrome of left shoulder region 02/17/2019   Lumbar radiculopathy 07/25/2018   Added automatically from request for surgery [id number]   Microscopic hematuria 10/17/2016   Migraine    "monthly" (08/28/2013)   NAUSEA  AND VOMITING 12/20/2008   Qualifier: Diagnosis of  By: Dorian Pod, Pam     NAUSEA AND VOMITING 12/20/2008   Pneumonia    "several times"   Type 2 diabetes mellitus (HCC) 07/02/2019   Past Surgical History:  Procedure Laterality Date   ABDOMINAL HYSTERECTOMY     APPENDECTOMY     AV FISTULA PLACEMENT Left 05/19/2019   Procedure: LEFT ARM Brachiocephalic ARTERIOVENOUS (AV) FISTULA CREATION;  Surgeon: Maeola Harman, MD;  Location: [facility] OR;  Service:  Vascular;  Laterality: Left;   AV FISTULA PLACEMENT Left 07/23/2019   Procedure: LEFT ARM ARTERIOVENOUS (AV) FISTULA CREATION;  Surgeon: Maeola Harman, MD;  Location: [facility] OR;  Service: Vascular;  Laterality: Left;   BALLOON DILATION N/A 12/29/2012   Procedure: Marvis Repress DILATION;  Surgeon: Louis Meckel, MD;  Location: [facility] ENDOSCOPY;  Service: Endoscopy;  Laterality: N/A;   BALLOON DILATION N/A 06/25/2013   Procedure: BALLOON DILATION;  Surgeon: Louis Meckel, MD;  Location: [facility] ENDOSCOPY;  Service: Endoscopy;  Laterality: N/A;   BASCILIC VEIN TRANSPOSITION Left 09/24/2019   Procedure: SECOND STAGE LEFT BASCILIC VEIN TRANSPOSITION;  Surgeon: Maeola Harman, MD;  Location: [facility] OR;  Service: Vascular;  Laterality: Left;   BOTOX INJECTION  12/29/2012   Procedure: BOTOX INJECTION;  Surgeon: Louis Meckel, MD;  Location: [facility] ENDOSCOPY;  Service: Endoscopy;;   BOTOX INJECTION N/A 06/25/2013   Procedure: BOTOX INJECTION;  Surgeon: Louis Meckel, MD;  Location: [facility] ENDOSCOPY;  Service: Endoscopy;  Laterality: N/A;   CAPD INSERTION N/A 03/20/2022   Procedure: LAPAROSCOPIC INSERTION CONTINUOUS AMBULATORY PERITONEAL DIALYSIS  (CAPD) CATHETER;  Surgeon: Maeola Harman, MD;  Location: [facility] OR;  Service: Vascular;  Laterality: N/A;   CAPD REMOVAL N/A 05/04/2022   Procedure: REMOVAL CONTINUOUS AMBULATORY PERITONEAL DIALYSIS  (CAPD) CATHETER;  Surgeon: Chuck Hint, MD;  Location: [facility] OR;  Service: Vascular;  Laterality: N/A;   CARDIAC CATHETERIZATION  X 2   Hattie Perch 08/28/2013   CARPAL TUNNEL RELEASE Right    CHOLECYSTECTOMY     COLONOSCOPY  04/10/2012   Normal    ESOPHAGEAL MANOMETRY N/A 12/26/2015   Procedure: ESOPHAGEAL MANOMETRY (EM);  Surgeon: Sherrilyn Rist, MD;  Location: [facility] ENDOSCOPY;  Service: Gastroenterology;  Laterality: N/A;   ESOPHAGOGASTRODUODENOSCOPY N/A 12/29/2012   Procedure: ESOPHAGOGASTRODUODENOSCOPY (EGD);  Surgeon: Louis Meckel, MD;  Location: [facility]  END[facility];  Service: Endoscopy;  Laterality: N/A;   ESOPHAGOGASTRODUODENOSCOPY N/A 06/25/2013   Procedure: ESOPHAGOGASTRODUODENOSCOPY (EGD);  Surgeon: Louis Meckel, MD;  Location: [facility] ENDOSCOPY;  Service: Endoscopy;  Laterality: N/A;   ESOPHAGOGASTRODUODENOSCOPY N/A 11/03/2013   Procedure: ESOPHAGOGASTRODUODENOSCOPY (EGD);  Surgeon: Louis Meckel, MD;  Location: [facility] ENDOSCOPY;  Service: Endoscopy;  Laterality: N/A;  Botox - ballon dilation   ESOPHAGOGASTRODUODENOSCOPY N/A 08/14/2016   Procedure: ESOPHAGOGASTRODUODENOSCOPY (EGD);  Surgeon: Rachael Fee, MD;  Location: [facility] ENDOSCOPY;  Service: Endoscopy;  Laterality: N/A;   FOOT FRACTURE SURGERY Right    HEEL SPUR EXCISION Right    TONSILLECTOMY     TUBAL LIGATION     Family History  Problem Relation Age of Onset   Hypertension Mother    Alzheimer's disease Father    Throat cancer Sister 56       died at age 76   Diabetes Brother    Colon cancer Neg Hx    Rectal cancer Neg Hx    Stomach cancer Neg Hx    Social History:  reports that she has never smoked. She has never  used smokeless tobacco. She reports that she does not drink alcohol and does not use drugs. Allergies  Allergen Reactions   Hydrochlorothiazide Itching   Amlodipine     Foot swelling/chest pain    Duloxetine     Hallucinations    Lisinopril Cough   Metformin     Unknown reaction   Codeine Itching    No hives or rash, takes benadryl   Dilaudid [Hydromorphone Hcl] Itching    "mild itching hrs after dilaudid given"   Prior to Admission medications   Medication Sig Start Date End Date Taking? Authorizing Provider  acetaminophen (TYLENOL) 500 MG tablet Take 1,000 mg by mouth every 6 (six) hours as needed for mild pain or headache.   Yes [provider]  albuterol (VENTOLIN HFA) 108 (90 Base) MCG/ACT inhaler Inhale 2 puffs into the lungs every 4 (four) hours as needed for wheezing or shortness of breath.    Yes [provider]  amLODipine  (NORVASC) 5 MG tablet Take 5 mg by mouth daily.   Yes [provider]  calcitRIOL (ROCALTROL) 0.5 MCG capsule Take 2 capsules (1 mcg total) by mouth every Monday, Wednesday, and Friday with hemodialysis. Patient taking differently: Take 1 mcg by mouth every Monday, Wednesday, and Friday with hemodialysis. Patient had Dialysis yesterday at [facility] 01/12/2023. 10/23/21  Yes Regalado, Belkys A, MD  cloNIDine (CATAPRES) 0.1 MG tablet Take 0.1-0.2 mg by mouth 2 (two) times daily. 12/19/22  Yes [provider]  cyclobenzaprine (FLEXERIL) 10 MG tablet Take 10 mg by mouth 2 (two) times daily as needed for muscle spasms. 03/21/21  Yes [provider]  famotidine (PEPCID) 40 MG tablet Take 1 tablet by mouth twice daily 06/11/22  Yes Lemmon, Violet Baldy, PA  furosemide (LASIX) 80 MG tablet Take 80 mg by mouth 2 (two) times daily. 07/17/19  Yes [provider]  HUMULIN N 100 UNIT/ML injection Inject 15 Units into the skin daily before breakfast. 06/26/18  Yes [provider]  ibuprofen (ADVIL) 600 MG tablet Take 600 mg by mouth every 6 (six) hours as needed. 10/29/22  Yes [provider]  isosorbide-hydrALAZINE (BIDIL) 20-37.5 MG tablet Take 1 tablet by mouth 3 (three) times daily. Patient taking differently: Take 1 tablet by mouth daily. 10/21/21  Yes Regalado, Belkys A, MD  labetalol (NORMODYNE) 100 MG tablet Take 100 mg by mouth daily. 05/04/21  Yes [provider]  lanthanum (FOSRENOL) 1000 MG chewable tablet Chew 1,000 mg by mouth 3 (three) times daily. 12/14/22  Yes [provider]  lidocaine (LIDODERM) 5 % Place 1 patch onto the skin daily. 12/27/22  Yes [provider]  nitroGLYCERIN (NITROSTAT) 0.4 MG SL tablet Place 0.4 mg under the tongue every 5 (five) minutes as needed for chest pain. 01/01/23  Yes [provider]  OZEMPIC, 0.25 OR 0.5 MG/DOSE, 2 MG/3ML SOPN Inject 0.25 mg into the skin once a week. Monday 01/07/2023  last injection. 01/01/23  Yes [provider]  colestipol (COLESTID) 1 g tablet Take 0.5 g by mouth 2 (two) times daily.    [provider]  gabapentin (NEURONTIN) 300 MG capsule Take 1 capsule (300 mg total) by mouth 2 (two) times daily. 10/21/21   Regalado, Jon Billings A, MD   Current Facility-Administered Medications  Medication Dose Route Frequency Provider Last Rate Last Admin   acetaminophen (TYLENOL) tablet 650 mg  650 mg Oral Q6H PRN Steffanie Rainwater, MD   650 mg at 01/13/23 1304   Or  acetaminophen (TYLENOL) suppository 650 mg  650 mg Rectal Q6H PRN Steffanie Rainwater, MD       albuterol (PROVENTIL) (2.5 MG/3ML) 0.083% nebulizer solution 2 mg  2 mg Inhalation Q4H PRN Steffanie Rainwater, MD       cloNIDine (CATAPRES) tablet 0.2 mg  0.2 mg Oral BID Steffanie Rainwater, MD   0.2 mg at 01/13/23 1304   cyclobenzaprine (FLEXERIL) tablet 10 mg  10 mg Oral BID PRN Steffanie Rainwater, MD   10 mg at 01/13/23 1415   famotidine (PEPCID) tablet 40 mg  40 mg Oral BID Steffanie Rainwater, MD   40 mg at 01/13/23 1304   furosemide (LASIX) tablet 80 mg  80 mg Oral Daily Steffanie Rainwater, MD   80 mg at 01/13/23 1304   gabapentin (NEURONTIN) capsule 100 mg  100 mg Oral Daily Steffanie Rainwater, MD   100 mg at 01/13/23 1415   heparin injection 5,000 Units  5,000 Units Subcutaneous Q8H Steffanie Rainwater, MD   5,000 Units at 01/13/23 1418   isosorbide-hydrALAZINE (BIDIL) 20-37.5 MG per tablet 1 tablet  1 tablet Oral TID Steffanie Rainwater, MD   1 tablet at 01/13/23 1304   labetalol (NORMODYNE) tablet 300 mg  300 mg Oral BID Steffanie Rainwater, MD   300 mg at 01/13/23 1303   nitroGLYCERIN (NITROSTAT) SL tablet 0.4 mg  0.4 mg Sublingual Q5 min PRN Steffanie Rainwater, MD       ondansetron [facility]) tablet 4 mg  4 mg Oral Q6H PRN Steffanie Rainwater, MD       Or   ondansetron (ZOFRAN) injection 4 mg  4 mg Intravenous Q6H PRN Steffanie Rainwater, MD       Oral care mouth rinse  15 mL  Mouth Rinse Q2H Steffanie Rainwater, MD   15 mL at 01/13/23 1416   Oral care mouth rinse  15 mL Mouth Rinse PRN Steffanie Rainwater, MD       senna-docusate (Senokot-S) tablet 1 tablet  1 tablet Oral QHS PRN Steffanie Rainwater, MD       Facility-Administered Medications Ordered in Other Encounters  Medication Dose Route Frequency Provider Last Rate Last Admin   0.9 %  sodium chloride infusion   Intravenous Continuous PRN Carmela Rima, CRNA   New Bag at 07/02/19 1304     ROS: As per HPI otherwise negative.  Physical Exam: Vitals:   01/13/23 0940 01/13/23 0943 01/13/23 1000 01/13/23 1406  BP: (!) 161/73  (!) 187/80 (!) 172/79  Pulse: (!) 109  (!) 109 (!) 102  Resp: 19  19 20   Temp: 98.9 F (37.2 C)     TempSrc: Oral     SpO2: 93% 93% 96% 98%  Weight: 97.9 kg     Height: 5\' 5"  (1.651 m)        General: Appears comfortable, in no distress  Head: NCAT sclera not icteric MMM Neck: Supple. No JVD appreciated  Lungs: Clear bilaterally without wheezes, rales, or rhonchi. Normal WOB  Heart: RRR, no murmur, rub, or gallop  Abdomen: soft non-tender, bowel sounds normal, no masses  Lower extremities:without edema or ischemic changes, no open wounds  Neuro: A & O X 3. Moves all extremities spontaneously. Psych:  Responds to questions appropriately with a normal affect. Dialysis Access: LUE AVF +bruit   Labs: Basic Metabolic Panel: No results for input(s): "NA", "K", "CL", "CO2", "GLUCOSE", "BUN", "CREATININE", "CALCIUM", "PHOS" in the last 168  hours.  Invalid input(s): "ALB" Liver Function Tests: No results for input(s): "AST", "ALT", "ALKPHOS", "BILITOT", "PROT", "ALBUMIN" in the last 168 hours. No results for input(s): "LIPASE", "AMYLASE" in the last 168 hours. No results for input(s): "AMMONIA" in the last 168 hours. CBC: Recent Labs  Lab 01/13/23 1357  WBC 20.4*  NEUTROABS 17.8*  HGB 10.8*  HCT 30.9*  MCV 84.9  PLT 235   Cardiac Enzymes: Recent Labs  Lab  01/13/23 1357  CKTOTAL 880*   CBG: No results for input(s): "GLUCAP" in the last 168 hours. Iron Studies: No results for input(s): "IRON", "TIBC", "TRANSFERRIN", "FERRITIN" in the last 72 hours. Studies/Results: No results found.  Dialysis Orders:  [facility] MWF 4:00 400/1.5A EDW 96.5kg 3K/2.5Ca  AVF No heparin Mircera 50 q 2 wks -last 11/25 No VDRA   Assessment/Plan: Seizures, new onset - neuro consulted. For cEEG/MRI  ESRD -  HD MWF - Missed 2 treatments this week but did have dialysis Fri/Sat at [facility]. Check labs today. Plan for HD Mond.  Hypertension/volume  - BP elevated. Continue home meds.  Anemia  - On ESA as outpatient. Hgb 10.8  Metabolic bone disease -  Continue home binders when eating  Leukocytosis - WBC 20 on admit - per primary  DMT2 - per primary  Nutrition - Renal diet with fluid restriction   Tomasa Blase PA-C [facility] 01/13/2023, 2:57 PM

## 2023-01-14 ENCOUNTER — Inpatient Hospital Stay (HOSPITAL_COMMUNITY): Payer: Medicare HMO

## 2023-01-14 DIAGNOSIS — R569 Unspecified convulsions: Secondary | ICD-10-CM | POA: Diagnosis not present

## 2023-01-14 LAB — GLUCOSE, CAPILLARY
Glucose-Capillary: 249 mg/dL — ABNORMAL HIGH (ref 70–99)
Glucose-Capillary: 248 mg/dL — ABNORMAL HIGH (ref 70–99)
Glucose-Capillary: 181 mg/dL — ABNORMAL HIGH (ref 70–99)

## 2023-01-14 LAB — RENAL FUNCTION PANEL
Albumin: 3 g/dL — ABNORMAL LOW (ref 3.5–5.0)
Anion gap: 13 (ref 5–15)
BUN: 48 mg/dL — ABNORMAL HIGH (ref 8–23)
CO2: 22 mmol/L (ref 22–32)
Calcium: 8.3 mg/dL — ABNORMAL LOW (ref 8.9–10.3)
Chloride: 97 mmol/L — ABNORMAL LOW (ref 98–111)
Creatinine, Ser: 7.51 mg/dL — ABNORMAL HIGH (ref 0.44–1.00)
GFR, Estimated: 6 mL/min — ABNORMAL LOW (ref >60)
Glucose, Bld: 344 mg/dL — ABNORMAL HIGH (ref 70–99)
Phosphorus: 6.1 mg/dL — ABNORMAL HIGH (ref 2.5–4.6)
Potassium: 4.3 mmol/L (ref 3.5–5.1)
Sodium: 132 mmol/L — ABNORMAL LOW (ref 135–145)

## 2023-01-14 LAB — CBC
HCT: 28.4 % — ABNORMAL LOW (ref 36.0–46.0)
Hemoglobin: 10 g/dL — ABNORMAL LOW (ref 12.0–15.0)
MCH: 29.9 pg (ref 26.0–34.0)
MCHC: 35.2 g/dL (ref 30.0–36.0)
MCV: 84.8 fL (ref 80.0–100.0)
Platelets: 220 10*3/uL (ref 150–400)
RBC: 3.35 MIL/uL — ABNORMAL LOW (ref 3.87–5.11)
RDW: 17.5 % — ABNORMAL HIGH (ref 11.5–15.5)
WBC: 13.8 10*3/uL — ABNORMAL HIGH (ref 4.0–10.5)
nRBC: 0.1 % (ref 0.0–0.2)

## 2023-01-14 LAB — PHENYTOIN LEVEL, TOTAL: Phenytoin Lvl: 12 ug/mL (ref 10.0–20.0)

## 2023-01-14 LAB — HEPATITIS B SURFACE ANTIBODY, QUANTITATIVE: Hep B S AB Quant (Post): 37.4 m[IU]/mL

## 2023-01-14 MED ORDER — PENTAFLUOROPROP-TETRAFLUOROETH EX AERO: 1.0000 | INHALATION_SPRAY | CUTANEOUS | Status: DC | PRN

## 2023-01-14 MED ORDER — LABETALOL HCL 5 MG/ML IV SOLN
10.0000 mg | INTRAVENOUS | Status: DC | PRN
  Administered 2023-01-14: 10 mg via INTRAVENOUS
  Filled 2023-01-14: qty 4

## 2023-01-14 MED ORDER — ANTICOAGULANT SODIUM CITRATE 4% (200MG/5ML) IV SOLN
5.0000 mL | Status: DC | PRN
  Filled 2023-01-14: qty 5

## 2023-01-14 MED ORDER — LIDOCAINE HCL (PF) 1 % IJ SOLN
5.0000 mL | INTRAMUSCULAR | Status: DC | PRN
  Filled 2023-01-14: qty 5

## 2023-01-14 MED ORDER — LEVETIRACETAM 500 MG PO TABS
500.0000 mg | ORAL_TABLET | Freq: Every day | ORAL | Status: DC
  Administered 2023-01-14 – 2023-01-16 (×3): 500 mg via ORAL
  Filled 2023-01-14 (×3): qty 1

## 2023-01-14 MED ORDER — ALTEPLASE 2 MG IJ SOLR
2.0000 mg | Freq: Once | INTRAMUSCULAR | Status: DC | PRN
  Filled 2023-01-14: qty 2

## 2023-01-14 MED ORDER — HYDRALAZINE HCL 20 MG/ML IJ SOLN
20.0000 mg | Freq: Once | INTRAMUSCULAR | Status: AC
  Administered 2023-01-14: 20 mg via INTRAVENOUS
  Filled 2023-01-14: qty 1

## 2023-01-14 MED ORDER — INSULIN GLARGINE-YFGN 100 UNIT/ML ~~LOC~~ SOLN
20.0000 [IU] | Freq: Every day | SUBCUTANEOUS | Status: DC
  Administered 2023-01-14 – 2023-01-15 (×2): 20 [IU] via SUBCUTANEOUS
  Filled 2023-01-14 (×3): qty 0.2

## 2023-01-14 MED ORDER — LIDOCAINE-PRILOCAINE 2.5-2.5 % EX CREA
1.0000 | TOPICAL_CREAM | CUTANEOUS | Status: DC | PRN
  Filled 2023-01-14: qty 5

## 2023-01-14 MED ORDER — HYDROCORTISONE 1 % EX CREA
1.0000 | TOPICAL_CREAM | Freq: Three times a day (TID) | CUTANEOUS | Status: DC | PRN
  Administered 2023-01-15: 1 via TOPICAL
  Filled 2023-01-14: qty 28

## 2023-01-14 MED ORDER — AMLODIPINE BESYLATE 5 MG PO TABS
5.0000 mg | ORAL_TABLET | Freq: Every day | ORAL | Status: DC
  Administered 2023-01-14 – 2023-01-16 (×3): 5 mg via ORAL
  Filled 2023-01-14 (×3): qty 1

## 2023-01-14 MED ORDER — HEPARIN SODIUM (PORCINE) 1000 UNIT/ML DIALYSIS
1000.0000 [IU] | INTRAMUSCULAR | Status: DC | PRN
  Filled 2023-01-14: qty 1

## 2023-01-14 MED ORDER — LEVETIRACETAM 250 MG PO TABS
250.0000 mg | ORAL_TABLET | ORAL | Status: DC
  Administered 2023-01-14: 250 mg via ORAL
  Filled 2023-01-14 (×2): qty 1

## 2023-01-14 NOTE — Procedures (Addendum)
Patient Name: [name] [name]  MRN: [id number]  Epilepsy Attending: Charlsie Quest  Referring Physician/Provider: Wynell Balloon, NP  Duration: 01/13/2023 1634 to 01/14/2023 1422  Patient history: 62 yo female with PMH of ESRD on HD, HTN, DM2, depression, GERD with previous ulcer, IDA, hx of Hep C, and HLD. Presents to [employer] as transfer from [facility] for new onset seizure in the setting of missed HD doses x 2. EEG to evaluate for seizure  Level of alertness: Awake, asleep  AEDs during EEG study: GBP, PHT  Technical aspects: This EEG study was done with scalp electrodes positioned according to the 10-20 International system of electrode placement. Electrical activity was reviewed with band pass filter of 1-70Hz , sensitivity of 7 uV/mm, display speed of 9mm/sec with a 60Hz  notched filter applied as appropriate. EEG data were recorded continuously and digitally stored.  Video monitoring was available and reviewed as appropriate.  Description: The posterior dominant rhythm consists of 7.5 Hz activity of moderate voltage (25-35 uV) seen predominantly in posterior head regions, symmetric and reactive to eye opening and eye closing. Sleep was characterized by vertex waves, sleep spindles (12 to 14 Hz), maximal frontocentral region. EEG showed continuous generalized 5 to 7 Hz theta-delta slowing. Hyperventilation and photic stimulation were not performed.     ABNORMALITY - Continuous slow, generalized  IMPRESSION: This study is suggestive of mild diffuse encephalopathy. No seizures or epileptiform discharges were seen throughout the recording.  Aprill Banko Annabelle Harman

## 2023-01-14 NOTE — Procedures (Signed)
HD Note:  Some information was entered later than the data was gathered due to patient care needs. The stated time with the data is accurate.  Received patient in bed to unit.   Alert and oriented.   Informed consent signed and in chart.   Access used: upper left arm fistula Access issues: None  Patient tolerated treatment well.   TX duration:4 hours  Alert, without acute distress.  Total UF removed: 3000 ml  Hand-off given to patient's nurse.   Transported back to the room   Haniah Penny L. Dareen Piano, RN [facility].

## 2023-01-14 NOTE — Progress Notes (Signed)
Pt receives out-pt HD at [facility] on MWF. Will assist as needed.   Melven Sartorius Renal Navigator [phone]

## 2023-01-14 NOTE — Progress Notes (Signed)
PROGRESS NOTE    BURNIS BRESSETTE  KVQ:[id number] DOB: Sep 15, 1960 DOA: 01/13/2023 PCP: Shelle Iron, MD     Brief Narrative:  [name] [name] is a 62 y.o. female with medical history significant of HTN, T2DM, ESRD on HD MWF, depression, GERD and HLD who was was transferred from [facility] to [facility] for further evaluation of seizure.  Friday evening, spouse reports that he found patient twitching while in bed.  Afterwards, she was slow to respond.  He called EMS and on EMS arrival, patient had a seizure episode where her face and arm started twitching causing her to fall off the bed.  Patient was transported to the [facility] ED and on arrival patient had another seizure episode when she bit her tongue. Patient reports that she missed her HD sessions on Wednesday and Friday because at the HD center, she noticed other patients were coughing so she left the HD center both days because she did not want to get sick.  At [facility], routine EEG which was notable for right temporal sharps. CTH neg. teleneurologist was consulted and 1 hour of  rEEG also showed right temporal sharp waves. Patient was  loaded with fosphenytoin 1 started on phenytoin 100 mg 3 times daily. She remained post-ictal. They requested transfer to [facility] for continuous EEG.    New events last 24 hours / Subjective: Patient currently undergoing continuous EEG.  She has no physical complaints.  Awaiting dialysis today  Assessment & Plan:   Principal Problem:   Seizure (HCC) Active Problems:   DM (diabetes mellitus), type 2 with neurological complications (HCC)   Essential hypertension   GERD (gastroesophageal reflux disease)   Diabetic neuropathy associated with type 2 diabetes mellitus (HCC)   ESRD on dialysis (HCC)   New onset seizure -In setting of missing dialysis x 2 -Currently undergoing continuous EEG -MRI brain: Negative for acute intracranial abnormality -Neurology following -Seizure precaution -IV  phenytoin  ESRD -HD MWF -Nephrology following  Hypertension -Clonidine, BiDil, labetalol, Norvasc, Lasix  Diabetes mellitus type 2, with hyperglycemia -A1c 7 -Semglee, NovoLog sliding scale -Neurontin  Leukocytosis -Insetting of seizures, continue to monitor  GERD -Pepcid  DVT prophylaxis:  heparin injection 5,000 Units Start: 01/13/23 1400  Code Status: Full code Family Communication: Spouse at bedside Disposition Plan: Home Status is: Inpatient Remains inpatient appropriate because: IV antiepileptic    Antimicrobials:  Anti-infectives (From admission, onward)    None        Objective: Vitals:   01/14/23 0557 01/14/23 0629 01/14/23 0828 01/14/23 1130  BP: (!) 205/86 (!) 196/82 (!) 187/87 (!) 171/78  Pulse: 93 97 94 88  Resp: 19 18 18 16   Temp:   98.5 F (36.9 C) 98.7 F (37.1 C)  TempSrc:   Axillary Oral  SpO2: 95% 97% 98% 98%  Weight:      Height:        Intake/Output Summary (Last 24 hours) at 01/14/2023 1327 Last data filed at 01/14/2023 0804 Gross per 24 hour  Intake 654 ml  Output 901 ml  Net -247 ml   Filed Weights   01/13/23 0940  Weight: 97.9 kg    Examination:  General exam: Appears calm and comfortable  Respiratory system: Clear to auscultation. Respiratory effort normal. No respiratory distress. No conversational dyspnea.  Cardiovascular system: S1 & S2 heard, RRR. No murmurs. No pedal edema. Gastrointestinal system: Abdomen is nondistended, soft and nontender. Normal bowel sounds heard. Central nervous system: Alert and oriented. No focal neurological deficits.  Speech clear.  Extremities: Symmetric in appearance  Skin: No rashes, lesions or ulcers on exposed skin  Psychiatry: Judgement and insight appear normal. Mood & affect appropriate.   Data Reviewed: I have personally reviewed following labs and imaging studies  CBC: Recent Labs  Lab 01/13/23 1357 01/14/23 0439  WBC 20.4* 13.8*  NEUTROABS 17.8*  --   HGB 10.8* 10.0*   HCT 30.9* 28.4*  MCV 84.9 84.8  PLT 235 220   Basic Metabolic Panel: Recent Labs  Lab 01/13/23 1357 01/14/23 0439  NA 131* 132*  K 5.1 4.3  CL 93* 97*  CO2 23 22  GLUCOSE 449* 344*  BUN 36* 48*  CREATININE 6.71* 7.51*  CALCIUM 8.4* 8.3*  MG 1.8  --   PHOS 6.8* 6.1*   GFR: Estimated Creatinine Clearance: 9.1 mL/min (A) (by C-G formula based on SCr of 7.51 mg/dL (H)). Liver Function Tests: Recent Labs  Lab 01/13/23 1357 01/14/23 0439  ALBUMIN 3.2* 3.0*   No results for input(s): "LIPASE", "AMYLASE" in the last 168 hours. No results for input(s): "AMMONIA" in the last 168 hours. Coagulation Profile: No results for input(s): "INR", "PROTIME" in the last 168 hours. Cardiac Enzymes: Recent Labs  Lab 01/13/23 1357  CKTOTAL 880*   BNP (last 3 results) No results for input(s): "PROBNP" in the last 8760 hours. HbA1C: Recent Labs    01/13/23 1357  HGBA1C 7.0*   CBG: Recent Labs  Lab 01/13/23 1720 01/13/23 2143 01/14/23 0602 01/14/23 1134  GLUCAP 424* 345* 249* 248*   Lipid Profile: No results for input(s): "CHOL", "HDL", "LDLCALC", "TRIG", "CHOLHDL", "LDLDIRECT" in the last 72 hours. Thyroid Function Tests: No results for input(s): "TSH", "T4TOTAL", "FREET4", "T3FREE", "THYROIDAB" in the last 72 hours. Anemia Panel: No results for input(s): "VITAMINB12", "FOLATE", "FERRITIN", "TIBC", "IRON", "RETICCTPCT" in the last 72 hours. Sepsis Labs: No results for input(s): "PROCALCITON", "LATICACIDVEN" in the last 168 hours.  Recent Results (from the past 240 hour(s))  MRSA Next Gen by PCR, Nasal     Status: None   Collection Time: 01/13/23  2:22 PM   Specimen: Nasal Mucosa; Nasal Swab  Result Value Ref Range Status   MRSA by PCR Next Gen NOT DETECTED NOT DETECTED Final    Comment: (NOTE) The GeneXpert MRSA Assay (FDA approved for NASAL specimens only), is one component of a comprehensive MRSA colonization surveillance program. It is not intended to diagnose  MRSA infection nor to guide or monitor treatment for MRSA infections. Test performance is not FDA approved in patients less than 24 years old. Performed at [facility], [address] [place], [place] [address]       Radiology Studies: MR BRAIN WO CONTRAST  Result Date: 01/14/2023 CLINICAL DATA:  Provided history: Seizure, new onset, no history of trauma. EXAM: MRI HEAD WITHOUT CONTRAST TECHNIQUE: Multiplanar, multiecho pulse sequences of the brain and surrounding structures were obtained without intravenous contrast. COMPARISON:  Brain MRI 10/19/2021. FINDINGS: Brain: No age advanced or lobar predominant parenchymal atrophy. Small chronic lacunar infarcts within the left caudate nucleus. Mild multifocal T2 FLAIR hyperintense signal abnormality within the cerebral white matter and pons. Findings are similar to the prior brain MRI of 10/19/2021. No cortical encephalomalacia is identified. No appreciable hippocampal size or signal asymmetry. There is no acute infarct. No evidence of an intracranial mass. No chronic intracranial blood products. No extra-axial fluid collection. No midline shift. Vascular: Maintained flow voids within the proximal large arterial vessels. Skull and upper cervical spine: No focal  worrisome marrow lesion. Sinuses/Orbits: No mass or acute finding within the imaged orbits. Prior bilateral ocular lens replacement. No significant paranasal sinus disease. Other: Trace fluid within the left mastoid air cells. Asymmetric T2 hyperintense signal within the left petrous apex, unchanged from the prior MRI and likely reflecting trapped fluid (series 4, image 8). IMPRESSION: 1. No evidence of an acute intracranial abnormality. 2. 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 3. Mild multifocal T2 FLAIR hyperintense signal abnormality within the cerebral white matter and pons, nonspecific but likely reflecting chronic  small vessel ischemic disease given the patient's history of diabetes mellitus, hypertension and hypercholesterolemia. 4. Otherwise unremarkable non-contrast MRI appearance of the brain. 5. Trapped fluid suspected within the left petrous apex. Electronically Signed   By: Jackey Loge D.O.   On: 01/14/2023 12:06   Overnight EEG with video  Result Date: 01/14/2023 Charlsie Quest, MD     01/14/2023  9:49 AM Patient Name: TRALANA GETTELFINGER MRN: [id number] Epilepsy Attending: Charlsie Quest Referring Physician/Provider: Wynell Balloon, NP Duration: 01/13/2023 1634 to 01/14/2023 0945 Patient history: 62 yo female with PMH of ESRD on HD, HTN, DM2, depression, GERD with previous ulcer, IDA, hx of Hep C, and HLD. Presents to [employer] as transfer from [facility] for new onset seizure in the setting of missed HD doses x 2. EEG to evaluate for seizure Level of alertness: Awake, asleep AEDs during EEG study: GBP, PHT Technical aspects: This EEG study was done with scalp electrodes positioned according to the 10-20 International system of electrode placement. Electrical activity was reviewed with band pass filter of 1-70Hz , sensitivity of 7 uV/mm, display speed of 40mm/sec with a 60Hz  notched filter applied as appropriate. EEG data were recorded continuously and digitally stored.  Video monitoring was available and reviewed as appropriate. Description: The posterior dominant rhythm consists of 7.5 Hz activity of moderate voltage (25-35 uV) seen predominantly in posterior head regions, symmetric and reactive to eye opening and eye closing. Sleep was characterized by vertex waves, sleep spindles (12 to 14 Hz), maximal frontocentral region. EEG showed continuous generalized 5 to 7 Hz theta-delta slowing. Hyperventilation and photic stimulation were not performed.   ABNORMALITY - Continuous slow, generalized IMPRESSION: This study is suggestive of mild diffuse encephalopathy. No seizures or epileptiform discharges were  seen throughout the recording. Priyanka Annabelle Harman      Scheduled Meds:  amLODipine  5 mg Oral Daily   Chlorhexidine Gluconate Cloth  6 each Topical Q0600   cloNIDine  0.2 mg Oral BID   famotidine  20 mg Oral Daily   furosemide  80 mg Oral Daily   gabapentin  100 mg Oral Daily   heparin  5,000 Units Subcutaneous Q8H   insulin aspart  0-15 Units Subcutaneous TID WC   insulin aspart  0-5 Units Subcutaneous QHS   insulin glargine-yfgn  15 Units Subcutaneous QHS   isosorbide-hydrALAZINE  1 tablet Oral TID   labetalol  300 mg Oral BID   lidocaine  1 patch Transdermal Q24H   mouth rinse  15 mL Mouth Rinse Q2H   Continuous Infusions:  anticoagulant sodium citrate     fosPHENYtoin (CEREBYX) IV 100 mg PE (01/14/23 0352)     LOS: 1 day   Time spent: 35 minutes   Noralee Stain, DO [facility] 01/14/2023, 1:27 PM   Available via Epic secure chat 7am-7pm After these hours, please refer to coverage provider listed on  [email or web address]

## 2023-01-14 NOTE — Progress Notes (Signed)
[facility] NEPHROLOGY PROGRESS NOTE  Assessment/ Plan: Pt is a 62 y.o. yo female  with ESRD on HD MWF, DMT2, HTN, HLD, Hep C s/p treatment who is admitted with new onset seizures.  Missed 2 HD treatment before admission. Dialysis Orders:  [facility] MWF 4:00 400/1.5A EDW 96.5kg 3K/2.5Ca  AVF No heparin Mircera 50 q 2 wks -last 11/25 No VDRA   # Seizures, new onset - neuro consulted.  EEG with no seizure, for MRI  #ESRD -  HD MWF - Missed 2 treatments prior to admission.  Plan for HD today.   #Hypertension/volume  - BP elevated. Continue home meds.  UF with HD today. #Anemia  - On ESA as outpatient. Hgb at goal. # CKD-metabolic bone disease -  Continue home binders when eating  #Leukocytosis - WBC count improving, thought to be due to reactive.  Trend  #DMT2 - per primary  #Nutrition - Renal diet with fluid restriction   Subjective: Seen and examined at the bedside.  Denies nausea, vomiting, chest pain or shortness of breath.  On EEG monitor.  Her husband was presented with her. Objective Vital signs in last 24 hours: Vitals:   01/14/23 0537 01/14/23 0557 01/14/23 0629 01/14/23 0828  BP: (!) 206/95 (!) 205/86 (!) 196/82 (!) 187/87  Pulse: 99 93 97 94  Resp: 17 19 18 18   Temp:    98.5 F (36.9 C)  TempSrc:    Axillary  SpO2: 96% 95% 97% 98%  Weight:      Height:       Weight change:   Intake/Output Summary (Last 24 hours) at 01/14/2023 1034 Last data filed at 01/14/2023 0600 Gross per 24 hour  Intake 774 ml  Output 901 ml  Net -127 ml       Labs: RENAL PANEL Recent Labs  Lab 01/13/23 1357 01/14/23 0439  NA 131* 132*  K 5.1 4.3  CL 93* 97*  CO2 23 22  GLUCOSE 449* 344*  BUN 36* 48*  CREATININE 6.71* 7.51*  CALCIUM 8.4* 8.3*  MG 1.8  --   PHOS 6.8* 6.1*  ALBUMIN 3.2* 3.0*    Liver Function Tests: Recent Labs  Lab 01/13/23 1357 01/14/23 0439  ALBUMIN 3.2* 3.0*   No results for input(s): "LIPASE", "AMYLASE" in the last 168 hours. No results  for input(s): "AMMONIA" in the last 168 hours. CBC: Recent Labs    03/20/22 0619 05/04/22 0919 01/13/23 1357 01/14/23 0439  HGB 7.8* 10.5* 10.8* 10.0*  MCV  --   --  84.9 84.8    Cardiac Enzymes: Recent Labs  Lab 01/13/23 1357  CKTOTAL 880*   CBG: Recent Labs  Lab 01/13/23 1720 01/13/23 2143 01/14/23 0602  GLUCAP 424* 345* 249*    Iron Studies: No results for input(s): "IRON", "TIBC", "TRANSFERRIN", "FERRITIN" in the last 72 hours. Studies/Results: Overnight EEG with video  Result Date: 01/14/2023 Charlsie Quest, MD     01/14/2023  9:49 AM Patient Name: [name] [name] MRN: [id number] Epilepsy Attending: Charlsie Quest Referring Physician/Provider: Wynell Balloon, NP Duration: 01/13/2023 1634 to 01/14/2023 0945 Patient history: 62 yo female with PMH of ESRD on HD, HTN, DM2, depression, GERD with previous ulcer, IDA, hx of Hep C, and HLD. Presents to [employer] as transfer from [facility] for new onset seizure in the setting of missed HD doses x 2. EEG to evaluate for seizure Level of alertness: Awake, asleep AEDs during EEG study: GBP, PHT Technical aspects: This EEG study  was done with scalp electrodes positioned according to the 10-20 International system of electrode placement. Electrical activity was reviewed with band pass filter of 1-70Hz , sensitivity of 7 uV/mm, display speed of 30mm/sec with a 60Hz  notched filter applied as appropriate. EEG data were recorded continuously and digitally stored.  Video monitoring was available and reviewed as appropriate. Description: The posterior dominant rhythm consists of 7.5 Hz activity of moderate voltage (25-35 uV) seen predominantly in posterior head regions, symmetric and reactive to eye opening and eye closing. Sleep was characterized by vertex waves, sleep spindles (12 to 14 Hz), maximal frontocentral region. EEG showed continuous generalized 5 to 7 Hz theta-delta slowing. Hyperventilation and photic stimulation were not  performed.   ABNORMALITY - Continuous slow, generalized IMPRESSION: This study is suggestive of mild diffuse encephalopathy. No seizures or epileptiform discharges were seen throughout the recording. Priyanka Annabelle Harman    Medications: Infusions:  fosPHENYtoin (CEREBYX) IV 100 mg PE (01/14/23 0352)    Scheduled Medications:  Chlorhexidine Gluconate Cloth  6 each Topical Q0600   cloNIDine  0.2 mg Oral BID   famotidine  20 mg Oral Daily   furosemide  80 mg Oral Daily   gabapentin  100 mg Oral Daily   heparin  5,000 Units Subcutaneous Q8H   insulin aspart  0-15 Units Subcutaneous TID WC   insulin aspart  0-5 Units Subcutaneous QHS   insulin glargine-yfgn  15 Units Subcutaneous QHS   isosorbide-hydrALAZINE  1 tablet Oral TID   labetalol  300 mg Oral BID   lidocaine  1 patch Transdermal Q24H   mouth rinse  15 mL Mouth Rinse Q2H    have reviewed scheduled and prn medications.  Physical Exam: General:NAD, comfortable Heart:RRR, s1s2 nl Lungs:clear b/l, no crackle Abdomen:soft, Non-tender, non-distended Extremities:No edema Dialysis Access: Left upper extremity AV fistula has good thrill and bruit.  Izzak Fries Prasad Matricia Begnaud 01/14/2023,10:34 AM  LOS: 1 day

## 2023-01-14 NOTE — Progress Notes (Signed)
LTM EEG discontinued - no skin breakdown at unhook.   

## 2023-01-14 NOTE — Progress Notes (Addendum)
NEUROLOGY CONSULT FOLLOW UP NOTE   Date of service: January 14, 2023 Patient Name: [name] [name] MRN:  [id number] DOB:  October 23, 1960  Brief HPI  [name] [name] is a 62 y.o. female  has a past medical history of + (02/17/2019), Abdominal pain (08/13/2016), Abdominal pain, unspecified site (07/17/2013), Abnormal EKG (08/29/2013), Acute gastric ulcer without hemorrhage or perforation, AKI (acute kidney injury) (HCC) (11/15/2016), Anemia, Anxiety, ARTHRITIS (03/10/2008), Asthma, Atypical chest pain (03/09/2009), BRONCHITIS, CHRONIC (03/10/2008), Chest pain (08/28/2013), Chronic lower back pain, CKD (chronic kidney disease), stage III (HCC) (10/17/2016), CKD (chronic kidney disease), stage V (HCC) (07/02/2019), Closed fracture of coccyx (HCC) (02/17/2019), Closed fracture of phalanx of foot (02/17/2019), Corns and callosities (08/13/2016), Depression, Diabetic neuropathy associated with type 2 diabetes mellitus (HCC) (12/25/2015), Diarrhea (07/17/2013), DM (diabetes mellitus), type 2 with neurological complications (HCC) (09/05/2009), Dyskinesia of esophagus (03/10/2008), Dysphagia (03/09/2009), End stage renal disease (HCC) (02/17/2019), Essential hypertension (03/10/2008), Frequent UTI, Gastritis (08/13/2016), GERD (gastroesophageal reflux disease), Heart murmur, HEPATITIS C, CHRONIC (03/10/2008), High cholesterol, History of blood transfusion, Hypertensive crisis (10/17/2021), Hypertensive emergency (07/03/2019), Hypertensive renal disease (02/17/2019), Hypertensive urgency (08/28/2013), Ileus (HCC) (10/21/2021), Impingement syndrome of left shoulder region (02/17/2019), Lumbar radiculopathy (07/25/2018), Microscopic hematuria (10/17/2016), Migraine, NAUSEA AND VOMITING (12/20/2008), NAUSEA AND VOMITING (12/20/2008), Pneumonia, and Type 2 diabetes mellitus (HCC) (07/02/2019). who presented with fever consisting of left face and hemibody jerking converting into full body jerking in the setting of 2 missed dialysis  sessions.  Patient has never had a seizure before but does have a nephew with seizures, had no problems with her birth and development, never had a serious head injury and never had encephalitis or meningitis.  She has had no other symptoms and states that she missed dialysis because she was worried about getting sick since others at the dialysis center appeared to be coughing.  She was originally seen at [facility] and was found to have right temporal sharp waves on resting EEG and was transferred here for continuous EEG.  They loaded her with fosphenytoin and started maintenance phenytoin.  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   Interval Hx/subjective   Patient has been hemodynamically stable with some blood pressures on the high end.  She has had no further seizure activity on long-term EEG and appears to be approaching her mental baseline. Vitals   Vitals:   01/14/23 0557 01/14/23 0629 01/14/23 0828 01/14/23 1130  BP: (!) 205/86 (!) 196/82 (!) 187/87 (!) 171/78  Pulse: 93 97 94 88  Resp: 19 18 18 16   Temp:   98.5 F (36.9 C) 98.7 F (37.1 C)  TempSrc:   Axillary Oral  SpO2: 95% 97% 98% 98%  Weight:      Height:         Body mass index is 35.92 kg/m.  Physical Exam   Constitutional: Appears well-developed and well-nourished.  Psych: Affect appropriate to situation.  Eyes: No scleral injection.  HENT: No OP obstrucion.  Head: Normocephalic.  Cardiovascular: Normal rate and regular rhythm.  Respiratory: Effort normal, non-labored breathing.  Skin: WDI.   Neurologic Examination    NEURO:  Mental Status: Oriented to person place and month but states that year is December.  Able to describe some things that happened prior to admission to the hospital but has no memory of seizure episode Speech/Language: speech is without dysarthria or aphasia.    Cranial Nerves:  II: PERRL. Visual fields full.  III,  IV,  VI: EOMI. Eyelids elevate symmetrically.  V: Sensation is intact to light touch and symmetrical to face.  VII: Smile is symmetrical. Able to puff cheeks and raise eyebrows.  VIII: hearing intact to voice. IX, X: Phonation is normal.  BJ:YNWGNFAO shrug 5/5. XII: tongue is midline without fasciculations. Motor: Able to move right upper extremity and bilateral lower extremities with good antigravity strength, some antigravity strength in left arm but motion is limited due to pain.  Right hand grip is strong at the left, but this is again limited due to pain Tone: is normal and bulk is normal Sensation- Intact to light touch bilaterally.  Coordination: FTN intact on the right, unable to perform on the left due to pain Gait- deferred   Medications  Current Facility-Administered Medications:    acetaminophen (TYLENOL) tablet 650 mg, 650 mg, Oral, Q6H PRN, 650 mg at 01/14/23 1211 **OR** acetaminophen (TYLENOL) suppository 650 mg, 650 mg, Rectal, Q6H PRN, [name] [name], [name] Buffy, MD   albuterol (PROVENTIL) (2.5 MG/3ML) 0.083% nebulizer solution 2 mg, 2 mg, Inhalation, Q4H PRN, [name] Rainwater, MD   Chlorhexidine Gluconate Cloth 2 % PADS 6 each, 6 each, Topical, Q0600, Tomasa Blase, PA-C, 6 each at 01/14/23 0355   cloNIDine (CATAPRES) tablet 0.2 mg, 0.2 mg, Oral, BID, [name] Rainwater, MD, 0.2 mg at 01/14/23 1308   cyclobenzaprine (FLEXERIL) tablet 10 mg, 10 mg, Oral, BID PRN, [name] Rainwater, MD, 10 mg at 01/14/23 0209   famotidine (PEPCID) tablet 20 mg, 20 mg, Oral, Daily, [name] Rainwater, MD, 20 mg at 01/14/23 6578   fosPHENYtoin (CEREBYX) 100 mg PE in sodium chloride 0.9 % 25 mL IVPB, 100 mg PE, Intravenous, Q8H, Leanord Hawking F, NP, Last Rate: 162 mL/hr at 01/14/23 0352, 100 mg PE at 01/14/23 0352   furosemide (LASIX) tablet 80 mg, 80 mg, Oral, Daily, [name] Rainwater, MD, 80 mg at 01/14/23 4696   gabapentin (NEURONTIN) capsule 100 mg, 100 mg, Oral, Daily, [name] Rainwater, MD, 100 mg at 01/14/23 0903   heparin injection 5,000 Units, 5,000 Units, Subcutaneous, Q8H, [name] Rainwater, MD, 5,000 Units at 01/14/23 0539   insulin aspart (novoLOG) injection 0-15 Units, 0-15 Units, Subcutaneous, TID WC, [name] Rainwater, MD, 5 Units at 01/14/23 1210   insulin aspart (novoLOG) injection 0-5 Units, 0-5 Units, Subcutaneous, QHS, [name] Rainwater, MD, 4 Units at 01/13/23 2148   insulin glargine-yfgn (SEMGLEE) injection 15 Units, 15 Units, Subcutaneous, QHS, [name] Rainwater, MD, 15 Units at 01/13/23 2148   isosorbide-hydrALAZINE (BIDIL) 20-37.5 MG per tablet 1 tablet, 1 tablet, Oral, TID, [name] Rainwater, MD, 1 tablet at 01/14/23 0903   labetalol (NORMODYNE) injection 10 mg, 10 mg, Intravenous, Q2H PRN, Howerter, Justin B, DO, 10 mg at 01/14/23 0537   labetalol (NORMODYNE) tablet 300 mg, 300 mg, Oral, BID, [name] Rainwater, MD, 300 mg at 01/14/23 0903   lidocaine (LIDODERM) 5 % 1 patch, 1 patch, Transdermal, Q24H, [name] Rainwater, MD, 1 patch at 01/13/23 1835   nitroGLYCERIN (NITROSTAT) SL tablet 0.4 mg, 0.4 mg, Sublingual, Q5 min PRN, [name] [name], [name] Buffy, MD   ondansetron (ZOFRAN) tablet 4 mg, 4 mg, Oral, Q6H PRN **OR** ondansetron (ZOFRAN) injection 4 mg, 4 mg, Intravenous, Q6H PRN, [name] [name], [name] Buffy, MD   Oral care mouth rinse, 15 mL, Mouth Rinse, Q2H, [place], [name] Buffy, MD, 15 mL at 01/14/23 0904   Oral care mouth rinse, 15 mL, Mouth Rinse, PRN, [name] [name], [name] Buffy, MD  senna-docusate (Senokot-S) tablet 1 tablet, 1 tablet, Oral, QHS PRN, [name] Rainwater, MD Labs and Diagnostic Imaging   CBC:  Recent Labs  Lab 01/13/23 1357 01/14/23 0439  WBC 20.4* 13.8*  NEUTROABS 17.8*  --   HGB 10.8* 10.0*  HCT 30.9* 28.4*  MCV 84.9 84.8  PLT 235 220    Basic Metabolic Panel:  Lab Results  Component Value Date   NA 132 (L) 01/14/2023   K 4.3 01/14/2023   CO2 22 01/14/2023   GLUCOSE 344 (H) 01/14/2023   BUN 48 (H) 01/14/2023    CREATININE 7.51 (H) 01/14/2023   CALCIUM 8.3 (L) 01/14/2023   GFRNONAA 6 (L) 01/14/2023   GFRAA 12 (L) 08/18/2019   Lipid Panel:  Lab Results  Component Value Date   LDLCALC 142 (H) 08/28/2013   HgbA1c:  Lab Results  Component Value Date   HGBA1C 7.0 (H) 01/13/2023   Urine Drug Screen: No results found for: "LABOPIA", "COCAINSCRNUR", "LABBENZ", "AMPHETMU", "THCU", "LABBARB"  Alcohol Level No results found for: "ETH" INR  Lab Results  Component Value Date   INR 1.0 10/17/2021   APTT  Lab Results  Component Value Date   APTT 25 10/22/2013   AED levels:  Lab Results  Component Value Date   PHENYTOIN 12.0 01/14/2023    MRI Brain(Personally reviewed): No acute abnormality, 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 female with history of end-stage renal disease on dialysis, hypertension, hyperlipidemia, diabetes, depression, GERD, ulcer and hepatitis C who presents with seizure activity consisting of first left-sided hemibody jerking and then spreading to generalized jerking in the setting of 2 missed dialysis treatments.  Patient has never had a seizure before and only has the seizure risk factor of 1 family member, identity, with seizures.  Suspect that seizure activity was due to electrolyte imbalance in the setting of missed hemodialysis sessions however given focality described at onset this has high risk of seizure recurrence and therefore she is indicated for long-term antiseizure medication.  Patient was started on phenytoin, but this can easily be changed to Keppra, as this AED has fewer drug interactions and does not require as much monitoring.  Recommendations  -Begin Keppra 500 mg daily and 250 mg after each dialysis  session -Discontinue Dilantin -Discontinue LTM EEG -Neurology will sign off, please call with questions or concerns ______________________________________________________________________ Patient discussed with Dr. Iver Nestle.  Signed, Cortney E Ernestina Columbia, NP [facility]   Patient discussed with me and plan discussed with me.  Patient seen by nurse practitioner, billing by nurse practitioner

## 2023-01-14 NOTE — Inpatient Diabetes Management (Signed)
Inpatient Diabetes Program Recommendations  AACE/ADA: New Consensus Statement on Inpatient Glycemic Control (2015)  Target Ranges:  Prepandial:   less than 140 mg/dL      Peak postprandial:   less than 180 mg/dL (1-2 hours)      Critically ill patients:  140 - 180 mg/dL   Lab Results  Component Value Date   GLUCAP 248 (H) 01/14/2023   HGBA1C 7.0 (H) 01/13/2023    Review of Glycemic Control  Latest Reference Range & Units 01/13/23 17:20 01/13/23 21:43 01/14/23 06:02 01/14/23 11:34  Glucose-Capillary 70 - 99 mg/dL 161 (H) 096 (H) 045 (H) 248 (H)  (H): Data is abnormally high Diabetes history: Type 2 DM Outpatient Diabetes medications: Humulin N 15 units every day, Ozempic .25 mg qwk Current orders for Inpatient glycemic control: Semglee 15 units every day, Novolog 0-15 units TID & HS  Inpatient Diabetes Program Recommendations:    Consider further increasing Semglee 20 units every day.   Thanks, Lujean Rave, MSN, RNC-OB Diabetes Coordinator [phone] (8a-5p)

## 2023-01-14 NOTE — Plan of Care (Signed)
?  Problem: Education: ?Goal: Expressions of having a comfortable level of knowledge regarding the disease process will increase ?Outcome: Progressing ?  ?Problem: Coping: ?Goal: Ability to adjust to condition or change in health will improve ?Outcome: Progressing ?  ?

## 2023-01-14 NOTE — Progress Notes (Addendum)
TR[facility] night cross cover note:   I was notified by RN of the patient's blood pressure 194/89, with overnight heart rates in the 80s to 90s.  She is on oral clonidine and oral labetalol, and is in end-stage renal disease patient on hemodialysis, with next hemodialysis scheduled to occur later today.  I subsequently ordered as needed IV labetalol for systolic blood pressure greater than 180 or diastolic blood pressure greater than 110 mmHg.  Update: no significant improvement in BP with iv labetalol.  Events that the patient is on clonidine be given currently, and I have also ordered a one-time dose of iv hydralazine leading up to today's HD session.      Newton Pigg, DO Hospitalist

## 2023-01-14 NOTE — Progress Notes (Signed)
vLTM maintenance  All impedances below 10kohms.  No skin breakdown noted at  FP1  FP2  A1  A2

## 2023-01-15 DIAGNOSIS — R569 Unspecified convulsions: Secondary | ICD-10-CM | POA: Diagnosis not present

## 2023-01-15 LAB — GLUCOSE, RANDOM: Glucose, Bld: 407 mg/dL — ABNORMAL HIGH (ref 70–99)

## 2023-01-15 LAB — CBC
HCT: 29.9 % — ABNORMAL LOW (ref 36.0–46.0)
Hemoglobin: 10.5 g/dL — ABNORMAL LOW (ref 12.0–15.0)
MCH: 29.6 pg (ref 26.0–34.0)
MCHC: 35.1 g/dL (ref 30.0–36.0)
MCV: 84.2 fL (ref 80.0–100.0)
Platelets: 217 10*3/uL (ref 150–400)
RBC: 3.55 MIL/uL — ABNORMAL LOW (ref 3.87–5.11)
RDW: 17.7 % — ABNORMAL HIGH (ref 11.5–15.5)
WBC: 12.6 10*3/uL — ABNORMAL HIGH (ref 4.0–10.5)
nRBC: 0 % (ref 0.0–0.2)

## 2023-01-15 LAB — GLUCOSE, CAPILLARY
Glucose-Capillary: 249 mg/dL — ABNORMAL HIGH (ref 70–99)
Glucose-Capillary: 102 mg/dL — ABNORMAL HIGH (ref 70–99)
Glucose-Capillary: 404 mg/dL — ABNORMAL HIGH (ref 70–99)
Glucose-Capillary: 123 mg/dL — ABNORMAL HIGH (ref 70–99)

## 2023-01-15 LAB — BASIC METABOLIC PANEL
Anion gap: 12 (ref 5–15)
BUN: 24 mg/dL — ABNORMAL HIGH (ref 8–23)
CO2: 27 mmol/L (ref 22–32)
Calcium: 8.6 mg/dL — ABNORMAL LOW (ref 8.9–10.3)
Chloride: 96 mmol/L — ABNORMAL LOW (ref 98–111)
Creatinine, Ser: 4.81 mg/dL — ABNORMAL HIGH (ref 0.44–1.00)
GFR, Estimated: 10 mL/min — ABNORMAL LOW (ref >60)
Glucose, Bld: 344 mg/dL — ABNORMAL HIGH (ref 70–99)
Potassium: 4 mmol/L (ref 3.5–5.1)
Sodium: 135 mmol/L (ref 135–145)

## 2023-01-15 MED ORDER — LABETALOL HCL 200 MG PO TABS
300.0000 mg | ORAL_TABLET | Freq: Three times a day (TID) | ORAL | Status: DC
  Administered 2023-01-15 – 2023-01-16 (×3): 300 mg via ORAL
  Filled 2023-01-15 (×3): qty 2

## 2023-01-15 MED ORDER — CHLORHEXIDINE GLUCONATE CLOTH 2 % EX PADS: 6.0000 | MEDICATED_PAD | Freq: Every day | CUTANEOUS | Status: DC

## 2023-01-15 NOTE — Progress Notes (Signed)
Mobility Specialist Progress Note:   01/15/23 1639  Mobility  Activity Ambulated with assistance in hallway  Level of Assistance Minimal assist, patient does 75% or more  Assistive Device Front wheel walker  Distance Ambulated (ft) 90 ft  Activity Response Tolerated well  Mobility Referral Yes  $Mobility charge 1 Mobility  Mobility Specialist Start Time (ACUTE ONLY) 1625  Mobility Specialist Stop Time (ACUTE ONLY) 1640  Mobility Specialist Time Calculation (min) (ACUTE ONLY) 15 min   Pt received in bed, agreeable to mobility. Pt very unsteady during ambulation requiring MinA for correction to prevent fall. C/o slight LUE pain when using RW, otherwise asx throughout. VSS. Pt returned to bed with call bell in reach and all needs met. Family present.   Leory Plowman  Mobility Specialist Please contact via [employer] office at [phone]

## 2023-01-15 NOTE — Progress Notes (Signed)
Dr. Alvino Chapel notified of stat glucose random confirmation of 407. Order given to give 18 units Sliding scale insulin.      01/15/23 1758  Provider Notification  Provider Name/Title Dr. Alvino Chapel Attending  Date Provider Notified 01/15/23  Time Provider Notified 1757  Method of Notification Call  Notification Reason Other (Comment) (Glucose Random result 407)  Provider response Evaluate remotely  Date of Provider Response 01/15/23  Time of Provider Response 1759

## 2023-01-15 NOTE — Plan of Care (Signed)
  Problem: Education: Goal: Expressions of having a comfortable level of knowledge regarding the disease process will increase Outcome: Progressing   Problem: Coping: Goal: Ability to adjust to condition or change in health will improve Outcome: Progressing   Problem: Health Behavior/Discharge Planning: Goal: Compliance with prescribed medication regimen will improve Outcome: Progressing

## 2023-01-15 NOTE — Plan of Care (Signed)
  Problem: Education: Goal: Expressions of having a comfortable level of knowledge regarding the disease process will increase Outcome: Progressing   Problem: Coping: Goal: Ability to adjust to condition or change in health will improve Outcome: Progressing Goal: Ability to identify appropriate support needs will improve Outcome: Progressing   Problem: Health Behavior/Discharge Planning: Goal: Compliance with prescribed medication regimen will improve Outcome: Progressing   

## 2023-01-15 NOTE — Evaluation (Signed)
Physical Therapy Evaluation Patient Details Name: [name] [name] MRN: [id number] DOB: 17-Jan-1961 Today's Date: 01/15/2023  History of Present Illness  62 y.o. female presents to [facility] hospital on 01/13/2023 as a transfer from [place] hospital due to concern for possible seizure. PMH includes HTN, DMII, ESRD MWF, depression, GERD, HLD.  Clinical Impression  Pt received in supine and agreeable to PT session. The pt reports to PT with deficits in strength, balance, and activity tolerance. The pt was able to ambulate household distances with hand held assist from the therapist. PT observed frequent LOB, staggering, and knees buckling. The pt would have likely fallen multiple times without therapy assistance. The pt has decreased awareness of her deficits at this time. Pt suspects pt will progress quickly in the acute setting prior to being discharged. The pt will continue to benefit from skilled PT to address remaining functional deficits.      If plan is discharge home, recommend the following: A little help with walking and/or transfers;A little help with bathing/dressing/bathroom;Assistance with cooking/housework;Assist for transportation;Help with stairs or ramp for entrance   Can travel by private vehicle        Equipment Recommendations Rolling walker (2 wheels) (TBD based on pt progress prior to discharge)  Recommendations for Other Services       Functional Status Assessment Patient has had a recent decline in their functional status and demonstrates the ability to make significant improvements in function in a reasonable and predictable amount of time.     Precautions / Restrictions Precautions Precautions: Fall Restrictions Weight Bearing Restrictions: No      Mobility  Bed Mobility Overal bed mobility: Needs Assistance Bed Mobility: Supine to Sit, Sit to Supine     Supine to sit: Supervision, HOB elevated, Used rails Sit to supine: Min assist, HOB elevated, Used rails (Pt  required assistance with elevating BLE onto the bed)        Transfers Overall transfer level: Needs assistance Equipment used: None Transfers: Sit to/from Stand Sit to Stand: Contact guard assist (Pt used the bed rails to rise to standing)                Ambulation/Gait Ambulation/Gait assistance: Mod assist Gait Distance (Feet): 300 Feet Assistive device: 1 person hand held assist (Therapist other hand around pt waist for trunk control) Gait Pattern/deviations: Step-through pattern, Decreased stride length, Knees buckling, Staggering left, Staggering right Gait velocity: slow Gait velocity interpretation: <1.31 ft/sec, indicative of household ambulator   General Gait Details: frequent LOB, Staggering right and left.  Stairs            Wheelchair Mobility     Tilt Bed    Modified Rankin (Stroke Patients Only)       Balance Overall balance assessment: Needs assistance Sitting-balance support: Single extremity supported, Feet supported Sitting balance-Leahy Scale: Fair     Standing balance support: Single extremity supported, During functional activity (Reliant on therapist for balance) Standing balance-Leahy Scale: Poor Standing balance comment: Pt required close hand held assist from therapist for balance                             Pertinent Vitals/Pain Pain Assessment Pain Assessment: No/denies pain (Pt reports feeling sore all over her body from fall during seizure.)    Home Living Family/patient expects to be discharged to:: Private residence Living Arrangements: Spouse/significant other Available Help at Discharge: Family;Available 24 hours/day Type of Home: House Home Access:  Stairs to enter Entrance Stairs-Rails: Can reach both Entrance Stairs-Number of Steps: 5   Home Layout: One level Home Equipment: Cane - single point      Prior Function Prior Level of Function : Independent/Modified Independent;Driving              Mobility Comments: Pt did not use any AD prior to hospitalization.       Extremity/Trunk Assessment   Upper Extremity Assessment Upper Extremity Assessment: Overall WFL for tasks assessed    Lower Extremity Assessment Lower Extremity Assessment: Generalized weakness    Cervical / Trunk Assessment Cervical / Trunk Assessment: Normal  Communication   Communication Communication: No apparent difficulties Cueing Techniques: Verbal cues;Tactile cues;Visual cues  Cognition Arousal: Alert Behavior During Therapy: Restless Overall Cognitive Status: No family/caregiver present to determine baseline cognitive functioning                                 General Comments: Pt has decreased awareness of her deficits and pt frequently ask the same questions throughout the session.        General Comments General comments (skin integrity, edema, etc.): Pt reports dizziness with ambulation    Exercises     Assessment/Plan    PT Assessment Patient needs continued PT services  PT Problem List Decreased strength;Decreased activity tolerance;Decreased balance;Decreased mobility;Decreased coordination       PT Treatment Interventions DME instruction;Gait training;Stair training;Functional mobility training;Therapeutic activities;Therapeutic exercise;Balance training;Neuromuscular re-education;Patient/family education;Cognitive remediation    PT Goals (Current goals can be found in the Care Plan section)  Acute Rehab PT Goals Patient Stated Goal: To get better PT Goal Formulation: With patient Time For Goal Achievement: 01/29/23 Potential to Achieve Goals: Good    Frequency Min 1X/week     Co-evaluation               AM-PAC PT "6 Clicks" Mobility  Outcome Measure Help needed turning from your back to your side while in a flat bed without using bedrails?: None Help needed moving from lying on your back to sitting on the side of a flat bed without using  bedrails?: None Help needed moving to and from a bed to a chair (including a wheelchair)?: A Little Help needed standing up from a chair using your arms (e.g., wheelchair or bedside chair)?: A Little Help needed to walk in hospital room?: A Lot Help needed climbing 3-5 steps with a railing? : Total 6 Click Score: 17    End of Session Equipment Utilized During Treatment: Gait belt Activity Tolerance: Patient limited by fatigue Patient left: in bed;with call bell/phone within reach;with bed alarm set Nurse Communication: Mobility status PT Visit Diagnosis: Unsteadiness on feet (R26.81);Muscle weakness (generalized) (M62.81);History of falling (Z91.81)    Time: 3474-2595 PT Time Calculation (min) (ACUTE ONLY): 28 min   Charges:   PT Evaluation $PT Eval Low Complexity: 1 Low   PT General Charges $$ ACUTE PT VISIT: 1 Visit         Caryl Comes, SPT Acute Rehabilitation Office Phone [phone]   Caryl Comes 01/15/2023, 10:19 AM

## 2023-01-15 NOTE — Progress Notes (Signed)
[facility] NEPHROLOGY PROGRESS NOTE  Assessment/ Plan: Pt is a 62 y.o. yo female  with ESRD on HD MWF, DMT2, HTN, HLD, Hep C s/p treatment who is admitted with new onset seizures.  Missed 2 HD treatment before admission. Dialysis Orders:  [facility] MWF 4:00 400/1.5A EDW 96.5kg 3K/2.5Ca  AVF No heparin Mircera 50 q 2 wks -last 11/25 No VDRA   # Seizures, new onset - neuro consulted.  EEG with no seizure, for MRI  #ESRD -  HD MWF - Missed 2 treatments prior to admission.  Dialysis yesterday with 3 L UF, tolerated well.  Plan for next HD tomorrow. #Hypertension/volume  - BP mostly acceptable. Continue home meds.  UF with HD. #Anemia  - On ESA as outpatient. Hgb at goal. # CKD-metabolic bone disease -  Continue home binders when eating  #Leukocytosis - WBC count improving, thought to be due to reactive.  Trend  #DMT2 - per primary  #Nutrition - Renal diet with fluid restriction  Discussed with the patient and her 2 daughters at the bedside.  Subjective: Seen and examined.  Tolerated dialysis well.  Denies nausea, vomiting, chest pain or shortness of breath.  Family members with her.  Objective Vital signs in last 24 hours: Vitals:   01/15/23 0546 01/15/23 0812 01/15/23 0844 01/15/23 1112  BP:  (!) 180/88 (!) 179/81 (!) 121/57  Pulse:  95 99 94  Resp:  20 20 20   Temp:  98.7 F (37.1 C)    TempSrc:  Oral  Oral  SpO2:    95%  Weight: 94.3 kg     Height:       Weight change: 0 kg  Intake/Output Summary (Last 24 hours) at 01/15/2023 1327 Last data filed at 01/15/2023 0549 Gross per 24 hour  Intake --  Output 3002 ml  Net -3002 ml       Labs: RENAL PANEL Recent Labs  Lab 01/13/23 1357 01/14/23 0439 01/15/23 0336  NA 131* 132* 135  K 5.1 4.3 4.0  CL 93* 97* 96*  CO2 23 22 27   GLUCOSE 449* 344* 344*  BUN 36* 48* 24*  CREATININE 6.71* 7.51* 4.81*  CALCIUM 8.4* 8.3* 8.6*  MG 1.8  --   --   PHOS 6.8* 6.1*  --   ALBUMIN 3.2* 3.0*  --     Liver Function  Tests: Recent Labs  Lab 01/13/23 1357 01/14/23 0439  ALBUMIN 3.2* 3.0*   No results for input(s): "LIPASE", "AMYLASE" in the last 168 hours. No results for input(s): "AMMONIA" in the last 168 hours. CBC: Recent Labs    03/20/22 0619 05/04/22 0919 01/13/23 1357 01/13/23 1357 01/14/23 0439 01/15/23 0336  HGB 7.8* 10.5* 10.8*  --  10.0* 10.5*  MCV  --   --  84.9   < > 84.8 84.2   < > = values in this interval not displayed.    Cardiac Enzymes: Recent Labs  Lab 01/13/23 1357  CKTOTAL 880*   CBG: Recent Labs  Lab 01/14/23 0602 01/14/23 1134 01/14/23 2135 01/15/23 0553 01/15/23 1115  GLUCAP 249* 248* 181* 249* 102*    Iron Studies: No results for input(s): "IRON", "TIBC", "TRANSFERRIN", "FERRITIN" in the last 72 hours. Studies/Results: MR BRAIN WO CONTRAST  Result Date: 01/14/2023 CLINICAL DATA:  Provided history: Seizure, new onset, no history of trauma. EXAM: MRI HEAD WITHOUT CONTRAST TECHNIQUE: Multiplanar, multiecho pulse sequences of the brain and surrounding structures were obtained without intravenous contrast. COMPARISON:  Brain MRI 10/19/2021. FINDINGS: Brain:  No age advanced or lobar predominant parenchymal atrophy. Small chronic lacunar infarcts within the left caudate nucleus. Mild multifocal T2 FLAIR hyperintense signal abnormality within the cerebral white matter and pons. Findings are similar to the prior brain MRI of 10/19/2021. No cortical encephalomalacia is identified. No appreciable hippocampal size or signal asymmetry. There is no acute infarct. No evidence of an intracranial mass. No chronic intracranial blood products. No extra-axial fluid collection. No midline shift. Vascular: Maintained flow voids within the proximal large arterial vessels. Skull and upper cervical spine: No focal worrisome marrow lesion. Sinuses/Orbits: No mass or acute finding within the imaged orbits. Prior bilateral ocular lens replacement. No significant paranasal sinus disease.  Other: Trace fluid within the left mastoid air cells. Asymmetric T2 hyperintense signal within the left petrous apex, unchanged from the prior MRI and likely reflecting trapped fluid (series 4, image 8). IMPRESSION: 1. No evidence of an acute intracranial abnormality. 2. 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 3. Mild multifocal T2 FLAIR hyperintense signal abnormality within the cerebral white matter and pons, nonspecific but likely reflecting chronic small vessel ischemic disease given the patient's history of diabetes mellitus, hypertension and hypercholesterolemia. 4. Otherwise unremarkable non-contrast MRI appearance of the brain. 5. Trapped fluid suspected within the left petrous apex. Electronically Signed   By: Jackey Loge D.O.   On: 01/14/2023 12:06   Overnight EEG with video  Result Date: 01/14/2023 Charlsie Quest, MD     01/14/2023  3:16 PM Patient Name: [name] [name] MRN: [id number] Epilepsy Attending: Charlsie Quest Referring Physician/Provider: Wynell Balloon, NP Duration: 01/13/2023 1634 to 01/14/2023 1422 Patient history: 61 yo female with PMH of ESRD on HD, HTN, DM2, depression, GERD with previous ulcer, IDA, hx of Hep C, and HLD. Presents to [facility] as transfer from [facility] for new onset seizure in the setting of missed HD doses x 2. EEG to evaluate for seizure Level of alertness: Awake, asleep AEDs during EEG study: GBP, PHT Technical aspects: This EEG study was done with scalp electrodes positioned according to the 10-20 International system of electrode placement. Electrical activity was reviewed with band pass filter of 1-70Hz , sensitivity of 7 uV/mm, display speed of 63mm/sec with a 60Hz  notched filter applied as appropriate. EEG data were recorded continuously and digitally stored.  Video monitoring was available and reviewed as appropriate. Description: The posterior dominant rhythm consists of  7.5 Hz activity of moderate voltage (25-35 uV) seen predominantly in posterior head regions, symmetric and reactive to eye opening and eye closing. Sleep was characterized by vertex waves, sleep spindles (12 to 14 Hz), maximal frontocentral region. EEG showed continuous generalized 5 to 7 Hz theta-delta slowing. Hyperventilation and photic stimulation were not performed.   ABNORMALITY - Continuous slow, generalized IMPRESSION: This study is suggestive of mild diffuse encephalopathy. No seizures or epileptiform discharges were seen throughout the recording. Priyanka Annabelle Harman    Medications: Infusions:    Scheduled Medications:  amLODipine  5 mg Oral Daily   Chlorhexidine Gluconate Cloth  6 each Topical Q0600   cloNIDine  0.2 mg Oral BID   famotidine  20 mg Oral Daily   furosemide  80 mg Oral Daily   gabapentin  100 mg Oral Daily   heparin  5,000 Units Subcutaneous Q8H   insulin aspart  0-15 Units Subcutaneous TID WC   insulin aspart  0-5 Units Subcutaneous QHS   insulin glargine-yfgn  20 Units Subcutaneous  QHS   isosorbide-hydrALAZINE  1 tablet Oral TID   labetalol  300 mg Oral TID   levETIRAcetam  500 mg Oral Daily   And   levETIRAcetam  250 mg Oral Q M,W,F-HD   lidocaine  1 patch Transdermal Q24H    have reviewed scheduled and prn medications.  Physical Exam: General:NAD, comfortable Heart:RRR, s1s2 nl Lungs:clear b/l, no crackle Abdomen:soft, Non-tender, non-distended Extremities:No edema Dialysis Access: Left upper extremity AV fistula has good thrill and bruit.  Chauncy Mangiaracina Prasad Paizlee Kinder 01/15/2023,1:27 PM  LOS: 2 days

## 2023-01-15 NOTE — Evaluation (Signed)
Occupational Therapy Evaluation Patient Details Name: [name] [name] MRN: [id number] DOB: August 03, 1960 Today's Date: 01/15/2023   History of Present Illness 62 y.o. female presents to [facility] hospital on 01/13/2023 as a transfer from [place] [facility] due to concern for possible seizure. PMH includes HTN, DMII, ESRD MWF, depression, GERD, HLD.   Clinical Impression   Patient admitted for the diagnosis above.  PTA she lives at home with her spouse, and needed no assist with any aspect of ADL, iADL or mobility.  Patient was undergoing dialysis.  Currently she presents with the deficits listed below, and is needing up to Mod A for ADL completion from a sit to stand level, and Min A for balance for in room mobility/toileting.  Patient is stating her L shoulder has become increasingly more painful, husband stating the patient did fall on the L shoulder at home prior to admission.  OT will continue efforts in the acute setting, and patient would benefit from [facility] OT, but if she does not progress as expected, SNF may be the option.          If plan is discharge home, recommend the following: Assist for transportation;A lot of help with walking and/or transfers;A lot of help with bathing/dressing/bathroom;Direct supervision/assist for medications management;Assistance with cooking/housework;Help with stairs or ramp for entrance    Functional Status Assessment  Patient has had a recent decline in their functional status and demonstrates the ability to make significant improvements in function in a reasonable and predictable amount of time.  Equipment Recommendations  None recommended by OT    Recommendations for Other Services       Precautions / Restrictions Precautions Precautions: Fall Restrictions Weight Bearing Restrictions: No      Mobility Bed Mobility   Bed Mobility: Sidelying to Sit, Sit to Sidelying   Sidelying to sit: Mod assist     Sit to sidelying: Mod assist General bed mobility  comments: patient unable to raise trunk without assist, and assist for feet back to bed    Transfers Overall transfer level: Needs assistance Equipment used: 1 person hand held assist Transfers: Sit to/from Stand, Bed to chair/wheelchair/BSC Sit to Stand: Contact guard assist     Step pivot transfers: Min assist            Balance Overall balance assessment: Needs assistance Sitting-balance support: Single extremity supported, Feet supported Sitting balance-Leahy Scale: Fair     Standing balance support: Single extremity supported, During functional activity Standing balance-Leahy Scale: Poor                             ADL either performed or assessed with clinical judgement   ADL       Grooming: Minimal assistance;Standing           Upper Body Dressing : Minimal assistance;Sitting Upper Body Dressing Details (indicate cue type and reason): painful L arm Lower Body Dressing: Minimal assistance;Sit to/from stand Lower Body Dressing Details (indicate cue type and reason): poor balance Toilet Transfer: Minimal assistance;Moderate assistance;Regular Toilet;Ambulation Toilet Transfer Details (indicate cue type and reason): HHA - poor safety and poor dynamic balance                 Vision Patient Visual Report: No change from baseline       Perception Perception: Within Functional Limits       Praxis Praxis: [facility] [place]       Pertinent Vitals/Pain Pain Assessment Pain Assessment: Faces  Faces Pain Scale: Hurts little more Pain Location: L shoulder Pain Descriptors / Indicators: Sharp Pain Intervention(s): Monitored during session, Patient requesting pain meds-RN notified     Extremity/Trunk Assessment Upper Extremity Assessment Upper Extremity Assessment: LUE deficits/detail LUE: Shoulder pain with ROM LUE Sensation: WNL LUE Coordination: WNL   Lower Extremity Assessment Lower Extremity Assessment: Defer to PT evaluation   Cervical /  Trunk Assessment Cervical / Trunk Assessment: Normal   Communication Communication Communication: No apparent difficulties   Cognition Arousal: Alert Behavior During Therapy: Flat affect Overall Cognitive Status: Impaired/Different from baseline Area of Impairment: Orientation, Following commands, Safety/judgement, Awareness                 Orientation Level: Disoriented to, Time, Situation     Following Commands: Follows one step commands with increased time Safety/Judgement: Decreased awareness of safety, Decreased awareness of deficits Awareness: Emergent         General Comments   VSS on RA    Exercises     Shoulder Instructions      Home Living Family/patient expects to be discharged to:: Private residence Living Arrangements: Spouse/significant other Available Help at Discharge: Family;Available 24 hours/day Type of Home: House Home Access: Stairs to enter [employer] of Steps: 5 Entrance Stairs-Rails: Can reach both Home Layout: One level     Bathroom Shower/Tub: Producer, television/film/video: Standard Bathroom Accessibility: Yes How Accessible: Accessible via walker Home Equipment: Cane - single point;Rolling Walker (2 wheels);Shower seat          Prior Functioning/Environment Prior Level of Function : Independent/Modified Independent;Driving             Mobility Comments: Pt did not use any AD prior to hospitalization. ADLs Comments: Ind ADL and iADL        OT Problem List: Decreased range of motion;Impaired balance (sitting and/or standing);Decreased safety awareness;Decreased cognition;Pain      OT Treatment/Interventions: Self-care/ADL training;Therapeutic activities;DME and/or AE instruction;Balance training    OT Goals(Current goals can be found in the care plan section) Acute Rehab OT Goals Patient Stated Goal: Return home OT Goal Formulation: With patient Time For Goal Achievement: 01/29/23 Potential to  Achieve Goals: Fair ADL Goals Pt Will Perform Grooming: with modified independence;standing Pt Will Perform Lower Body Dressing: with modified independence;sit to/from stand Pt Will Transfer to Toilet: with modified independence;ambulating;regular height toilet  OT Frequency: Min 1X/week    Co-evaluation              AM-PAC OT "6 Clicks" Daily Activity     Outcome Measure Help from another person eating meals?: None Help from another person taking care of personal grooming?: A Little Help from another person toileting, which includes using toliet, bedpan, or urinal?: A Lot Help from another person bathing (including washing, rinsing, drying)?: A Lot Help from another person to put on and taking off regular upper body clothing?: A Little Help from another person to put on and taking off regular lower body clothing?: A Lot 6 Click Score: 16   End of Session Equipment Utilized During Treatment: Gait belt Nurse Communication: Mobility status  Activity Tolerance: Patient tolerated treatment well Patient left: in bed;with call bell/phone within reach;with family/visitor present  OT Visit Diagnosis: Unsteadiness on feet (R26.81);Other symptoms and signs involving cognitive function;Pain Pain - Right/Left: Left Pain - part of body: Shoulder                Time: 5643-3295 OT Time Calculation (min): 18 min  Charges:  OT General Charges $OT Visit: 1 Visit OT Evaluation $OT Eval Moderate Complexity: 1 Mod  01/15/2023  RP, OTR/L  Acute Rehabilitation Services  Office:  [phone]   Suzanna Obey 01/15/2023, 3:30 PM

## 2023-01-15 NOTE — Discharge Instructions (Signed)
Per [place] DMV statutes, patients with seizures are not allowed to drive until they have been seizure-free for six months. Use caution when using heavy equipment or power tools. Avoid working on ladders or at heights. Take showers instead of baths. Ensure the water temperature is not too high on the home water heater. Do not go swimming alone. When caring for infants or small children, sit down when holding, feeding, or changing them to minimize risk of injury to the child in the event you have a seizure. Also, Maintain good sleep hygiene. Avoid alcohol. ° °If patient has another seizure, call 911 and bring them back to the ED if: °A.  The seizure lasts longer than 5 minutes.      °B.  The patient doesn't wake shortly after the seizure or has new problems such as difficulty seeing, speaking or moving following the seizure °C.  The patient was injured during the seizure °D.  The patient has a temperature over 102 F (39C) °E.  The patient vomited during the seizure and now is having trouble breathing °  ° °

## 2023-01-15 NOTE — Progress Notes (Signed)
PROGRESS NOTE    [name] [name]  YQM:[id number] DOB: Jan 06, 1961 DOA: 01/13/2023 PCP: Shelle Iron, MD     Brief Narrative:  [name] [name] is a 62 y.o. female with medical history significant of HTN, T2DM, ESRD on HD MWF, depression, GERD and HLD who was was transferred from [facility] to [facility] for further evaluation of seizure.  Friday evening, spouse reports that he found patient twitching while in bed.  Afterwards, she was slow to respond.  He called EMS and on EMS arrival, patient had a seizure episode where her face and arm started twitching causing her to fall off the bed.  Patient was transported to the [facility] ED and on arrival patient had another seizure episode when she bit her tongue. Patient reports that she missed her HD sessions on Wednesday and Friday because at the HD center, she noticed other patients were coughing so she left the HD center both days because she did not want to get sick.  At [facility], routine EEG which was notable for right temporal sharps. CTH neg. teleneurologist was consulted and 1 hour of  rEEG also showed right temporal sharp waves. Patient was  loaded with fosphenytoin 1 started on phenytoin 100 mg 3 times daily. She remained post-ictal. They requested transfer to [facility] for continuous EEG.  Patient remained stable and was transitioned to Keppra.  New events last 24 hours / Subjective: Feeling well overall.  No acute events.  PT worked with patient, recommended continued PT due to deficit in strength, balance and activity tolerance.  Suspected patient will progress quickly prior to being discharged.  Assessment & Plan:   Principal Problem:   Seizure (HCC) Active Problems:   DM (diabetes mellitus), type 2 with neurological complications (HCC)   Essential hypertension   GERD (gastroesophageal reflux disease)   Diabetic neuropathy associated with type 2 diabetes mellitus (HCC)   ESRD on dialysis (HCC)   New onset seizure -In  setting of missing dialysis x 2 -Status post continuous EEG -MRI brain: Negative for acute intracranial abnormality -Neurology signed off 12/2 -Seizure precaution -IV phenytoin --> Keppra  ESRD -HD MWF -Nephrology following  Hypertension -Clonidine, BiDil, labetalol, Norvasc, Lasix -Labetalol dose increased today  Diabetes mellitus type 2, with hyperglycemia -A1c 7 -Semglee, NovoLog sliding scale -Neurontin  Leukocytosis -Insetting of seizures, continue to monitor  GERD -Pepcid  DVT prophylaxis:  heparin injection 5,000 Units Start: 01/13/23 1400  Code Status: Full code Family Communication: No family at bedside Disposition Plan: Home Status is: Inpatient Remains inpatient appropriate because: PT    Antimicrobials:  Anti-infectives (From admission, onward)    None        Objective: Vitals:   01/15/23 0546 01/15/23 0812 01/15/23 0844 01/15/23 1112  BP:  (!) 180/88 (!) 179/81 (!) 121/57  Pulse:  95 99 94  Resp:  20 20 20   Temp:  98.7 F (37.1 C)    TempSrc:  Oral  Oral  SpO2:    95%  Weight: 94.3 kg     Height:        Intake/Output Summary (Last 24 hours) at 01/15/2023 1348 Last data filed at 01/15/2023 0549 Gross per 24 hour  Intake --  Output 3002 ml  Net -3002 ml   Filed Weights   01/13/23 0940 01/14/23 1541 01/15/23 0546  Weight: 97.9 kg 97.9 kg 94.3 kg    Examination:  General exam: Appears calm and comfortable  Respiratory system: Clear to auscultation. Respiratory effort normal. No respiratory distress.  No conversational dyspnea.  Cardiovascular system: S1 & S2 heard, RRR. No murmurs. No pedal edema. Gastrointestinal system: Abdomen is nondistended, soft and nontender. Normal bowel sounds heard. Central nervous system: Alert and oriented. No focal neurological deficits. Speech clear.  Extremities: Symmetric in appearance  Skin: No rashes, lesions or ulcers on exposed skin  Psychiatry: Judgement and insight appear normal. Mood & affect  appropriate.   Data Reviewed: I have personally reviewed following labs and imaging studies  CBC: Recent Labs  Lab 01/13/23 1357 01/14/23 0439 01/15/23 0336  WBC 20.4* 13.8* 12.6*  NEUTROABS 17.8*  --   --   HGB 10.8* 10.0* 10.5*  HCT 30.9* 28.4* 29.9*  MCV 84.9 84.8 84.2  PLT 235 220 217   Basic Metabolic Panel: Recent Labs  Lab 01/13/23 1357 01/14/23 0439 01/15/23 0336  NA 131* 132* 135  K 5.1 4.3 4.0  CL 93* 97* 96*  CO2 23 22 27   GLUCOSE 449* 344* 344*  BUN 36* 48* 24*  CREATININE 6.71* 7.51* 4.81*  CALCIUM 8.4* 8.3* 8.6*  MG 1.8  --   --   PHOS 6.8* 6.1*  --    GFR: Estimated Creatinine Clearance: 13.9 mL/min (A) (by C-G formula based on SCr of 4.81 mg/dL (H)). Liver Function Tests: Recent Labs  Lab 01/13/23 1357 01/14/23 0439  ALBUMIN 3.2* 3.0*   No results for input(s): "LIPASE", "AMYLASE" in the last 168 hours. No results for input(s): "AMMONIA" in the last 168 hours. Coagulation Profile: No results for input(s): "INR", "PROTIME" in the last 168 hours. Cardiac Enzymes: Recent Labs  Lab 01/13/23 1357  CKTOTAL 880*   BNP (last 3 results) No results for input(s): "PROBNP" in the last 8760 hours. HbA1C: Recent Labs    01/13/23 1357  HGBA1C 7.0*   CBG: Recent Labs  Lab 01/14/23 0602 01/14/23 1134 01/14/23 2135 01/15/23 0553 01/15/23 1115  GLUCAP 249* 248* 181* 249* 102*   Lipid Profile: No results for input(s): "CHOL", "HDL", "LDLCALC", "TRIG", "CHOLHDL", "LDLDIRECT" in the last 72 hours. Thyroid Function Tests: No results for input(s): "TSH", "T4TOTAL", "FREET4", "T3FREE", "THYROIDAB" in the last 72 hours. Anemia Panel: No results for input(s): "VITAMINB12", "FOLATE", "FERRITIN", "TIBC", "IRON", "RETICCTPCT" in the last 72 hours. Sepsis Labs: No results for input(s): "PROCALCITON", "LATICACIDVEN" in the last 168 hours.  Recent Results (from the past 240 hour(s))  MRSA Next Gen by PCR, Nasal     Status: None   Collection Time:  01/13/23  2:22 PM   Specimen: Nasal Mucosa; Nasal Swab  Result Value Ref Range Status   MRSA by PCR Next Gen NOT DETECTED NOT DETECTED Final    Comment: (NOTE) The GeneXpert MRSA Assay (FDA approved for NASAL specimens only), is one component of a comprehensive MRSA colonization surveillance program. It is not intended to diagnose MRSA infection nor to guide or monitor treatment for MRSA infections. Test performance is not FDA approved in patients less than 6 years old. Performed at [facility], [address], [place], [place] [address]       Radiology Studies: MR BRAIN WO CONTRAST  Result Date: 01/14/2023 CLINICAL DATA:  Provided history: Seizure, new onset, no history of trauma. EXAM: MRI HEAD WITHOUT CONTRAST TECHNIQUE: Multiplanar, multiecho pulse sequences of the brain and surrounding structures were obtained without intravenous contrast. COMPARISON:  Brain MRI 10/19/2021. FINDINGS: Brain: No age advanced or lobar predominant parenchymal atrophy. Small chronic lacunar infarcts within the left caudate nucleus. Mild multifocal T2 FLAIR hyperintense signal abnormality within the  cerebral white matter and pons. Findings are similar to the prior brain MRI of 10/19/2021. No cortical encephalomalacia is identified. No appreciable hippocampal size or signal asymmetry. There is no acute infarct. No evidence of an intracranial mass. No chronic intracranial blood products. No extra-axial fluid collection. No midline shift. Vascular: Maintained flow voids within the proximal large arterial vessels. Skull and upper cervical spine: No focal worrisome marrow lesion. Sinuses/Orbits: No mass or acute finding within the imaged orbits. Prior bilateral ocular lens replacement. No significant paranasal sinus disease. Other: Trace fluid within the left mastoid air cells. Asymmetric T2 hyperintense signal within the left petrous apex, unchanged from the prior MRI and likely reflecting trapped fluid (series  4, image 8). IMPRESSION: 1. No evidence of an acute intracranial abnormality. 2. 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 3. Mild multifocal T2 FLAIR hyperintense signal abnormality within the cerebral white matter and pons, nonspecific but likely reflecting chronic small vessel ischemic disease given the patient's history of diabetes mellitus, hypertension and hypercholesterolemia. 4. Otherwise unremarkable non-contrast MRI appearance of the brain. 5. Trapped fluid suspected within the left petrous apex. Electronically Signed   By: Jackey Loge D.O.   On: 01/14/2023 12:06   Overnight EEG with video  Result Date: 01/14/2023 Charlsie Quest, MD     01/14/2023  3:16 PM Patient Name: RORY KEIMIG MRN: [id number] Epilepsy Attending: Charlsie Quest Referring Physician/Provider: Wynell Balloon, NP Duration: 01/13/2023 1634 to 01/14/2023 1422 Patient history: 62 yo female with PMH of ESRD on HD, HTN, DM2, depression, GERD with previous ulcer, IDA, hx of Hep C, and HLD. Presents to [employer] as transfer from [facility] for new onset seizure in the setting of missed HD doses x 2. EEG to evaluate for seizure Level of alertness: Awake, asleep AEDs during EEG study: GBP, PHT Technical aspects: This EEG study was done with scalp electrodes positioned according to the 10-20 International system of electrode placement. Electrical activity was reviewed with band pass filter of 1-70Hz , sensitivity of 7 uV/mm, display speed of 67mm/sec with a 60Hz  notched filter applied as appropriate. EEG data were recorded continuously and digitally stored.  Video monitoring was available and reviewed as appropriate. Description: The posterior dominant rhythm consists of 7.5 Hz activity of moderate voltage (25-35 uV) seen predominantly in posterior head regions, symmetric and reactive to eye opening and eye closing. Sleep was characterized by vertex waves,  sleep spindles (12 to 14 Hz), maximal frontocentral region. EEG showed continuous generalized 5 to 7 Hz theta-delta slowing. Hyperventilation and photic stimulation were not performed.   ABNORMALITY - Continuous slow, generalized IMPRESSION: This study is suggestive of mild diffuse encephalopathy. No seizures or epileptiform discharges were seen throughout the recording. Priyanka Annabelle Harman      Scheduled Meds:  amLODipine  5 mg Oral Daily   Chlorhexidine Gluconate Cloth  6 each Topical Q0600   [START ON 01/16/2023] Chlorhexidine Gluconate Cloth  6 each Topical Q0600   cloNIDine  0.2 mg Oral BID   famotidine  20 mg Oral Daily   furosemide  80 mg Oral Daily   gabapentin  100 mg Oral Daily   heparin  5,000 Units Subcutaneous Q8H   insulin aspart  0-15 Units Subcutaneous TID WC   insulin aspart  0-5 Units Subcutaneous QHS   insulin glargine-yfgn  20 Units Subcutaneous QHS   isosorbide-hydrALAZINE  1 tablet Oral TID   labetalol  300 mg Oral  TID   levETIRAcetam  500 mg Oral Daily   And   levETIRAcetam  250 mg Oral Q M,W,F-HD   lidocaine  1 patch Transdermal Q24H   Continuous Infusions:     LOS: 2 days   Time spent: 20 minutes   Noralee Stain, DO [facility] 01/15/2023, 1:48 PM   Available via Epic secure chat 7am-7pm After these hours, please refer to coverage provider listed on [email or web address]

## 2023-01-15 NOTE — Progress Notes (Signed)
TRH night cross cover note:   I was notified by RN that the patient continues to complain of left shoulder discomfort stemming from a fall at home several days ago.  I subsequently placed order for plain films of the left shoulder.    Newton Pigg, DO Hospitalist

## 2023-01-15 NOTE — Care Management Important Message (Signed)
Important Message  Patient Details  Name: PELLA ORIGER MRN: [id number] Date of Birth: 07-26-1960   Important Message Given:  Yes - Medicare IM     Sherilyn Banker 01/15/2023, 2:24 PM

## 2023-01-15 NOTE — Plan of Care (Signed)
  Problem: Coping: Goal: Ability to identify appropriate support needs will improve Outcome: Progressing   Problem: Health Behavior/Discharge Planning: Goal: Compliance with prescribed medication regimen will improve Outcome: Progressing   Problem: Self-Concept: Goal: Level of anxiety will decrease Outcome: Progressing Goal: Ability to verbalize feelings about condition will improve Outcome: Progressing   Problem: Education: Goal: Expressions of having a comfortable level of knowledge regarding the disease process will increase Outcome: Not Progressing   Problem: Nutritional: Goal: Progress toward achieving an optimal weight will improve Outcome: Not Progressing

## 2023-01-16 ENCOUNTER — Inpatient Hospital Stay (HOSPITAL_COMMUNITY): Payer: Medicare HMO

## 2023-01-16 ENCOUNTER — Other Ambulatory Visit (HOSPITAL_COMMUNITY): Payer: Self-pay

## 2023-01-16 DIAGNOSIS — R569 Unspecified convulsions: Secondary | ICD-10-CM | POA: Diagnosis not present

## 2023-01-16 LAB — BASIC METABOLIC PANEL
Anion gap: 14 (ref 5–15)
BUN: 46 mg/dL — ABNORMAL HIGH (ref 8–23)
CO2: 24 mmol/L (ref 22–32)
Calcium: 8.2 mg/dL — ABNORMAL LOW (ref 8.9–10.3)
Chloride: 96 mmol/L — ABNORMAL LOW (ref 98–111)
Creatinine, Ser: 6.95 mg/dL — ABNORMAL HIGH (ref 0.44–1.00)
GFR, Estimated: 6 mL/min — ABNORMAL LOW (ref >60)
Glucose, Bld: 125 mg/dL — ABNORMAL HIGH (ref 70–99)
Potassium: 4 mmol/L (ref 3.5–5.1)
Sodium: 134 mmol/L — ABNORMAL LOW (ref 135–145)

## 2023-01-16 LAB — CBC
HCT: 29.7 % — ABNORMAL LOW (ref 36.0–46.0)
Hemoglobin: 10.4 g/dL — ABNORMAL LOW (ref 12.0–15.0)
MCH: 29.5 pg (ref 26.0–34.0)
MCHC: 35 g/dL (ref 30.0–36.0)
MCV: 84.1 fL (ref 80.0–100.0)
Platelets: 214 10*3/uL (ref 150–400)
RBC: 3.53 MIL/uL — ABNORMAL LOW (ref 3.87–5.11)
RDW: 17.7 % — ABNORMAL HIGH (ref 11.5–15.5)
WBC: 13.1 10*3/uL — ABNORMAL HIGH (ref 4.0–10.5)
nRBC: 0 % (ref 0.0–0.2)

## 2023-01-16 LAB — GLUCOSE, CAPILLARY
Glucose-Capillary: 177 mg/dL — ABNORMAL HIGH (ref 70–99)
Glucose-Capillary: 180 mg/dL — ABNORMAL HIGH (ref 70–99)

## 2023-01-16 MED ORDER — FAMOTIDINE 20 MG PO TABS
20.0000 mg | ORAL_TABLET | Freq: Every day | ORAL | 0 refills | Status: DC
  Filled 2023-01-16: qty 30, 30d supply, fill #0

## 2023-01-16 MED ORDER — LABETALOL HCL 300 MG PO TABS
300.0000 mg | ORAL_TABLET | Freq: Three times a day (TID) | ORAL | 0 refills | Status: DC
  Filled 2023-01-16: qty 90, 30d supply, fill #0

## 2023-01-16 MED ORDER — PENTAFLUOROPROP-TETRAFLUOROETH EX AERO: 1.0000 | INHALATION_SPRAY | CUTANEOUS | Status: DC | PRN

## 2023-01-16 MED ORDER — ALTEPLASE 2 MG IJ SOLR: 2.0000 mg | Freq: Once | INTRAMUSCULAR | Status: DC | PRN

## 2023-01-16 MED ORDER — LIDOCAINE-PRILOCAINE 2.5-2.5 % EX CREA: 1.0000 | TOPICAL_CREAM | CUTANEOUS | Status: DC | PRN

## 2023-01-16 MED ORDER — FUROSEMIDE 80 MG PO TABS
80.0000 mg | ORAL_TABLET | Freq: Every day | ORAL | 0 refills | Status: DC
  Filled 2023-01-16: qty 30, 30d supply, fill #0

## 2023-01-16 MED ORDER — LIDOCAINE HCL (PF) 1 % IJ SOLN: 5.0000 mL | INTRAMUSCULAR | Status: DC | PRN

## 2023-01-16 MED ORDER — GABAPENTIN 100 MG PO CAPS
100.0000 mg | ORAL_CAPSULE | Freq: Every day | ORAL | 0 refills | Status: DC
  Filled 2023-01-16: qty 30, 30d supply, fill #0

## 2023-01-16 MED ORDER — LEVETIRACETAM 250 MG PO TABS
ORAL_TABLET | ORAL | 0 refills | Status: AC
  Filled 2023-01-16: qty 72, 30d supply, fill #0

## 2023-01-16 MED ORDER — ANTICOAGULANT SODIUM CITRATE 4% (200MG/5ML) IV SOLN: 5.0000 mL | Status: DC | PRN

## 2023-01-16 MED ORDER — HEPARIN SODIUM (PORCINE) 1000 UNIT/ML DIALYSIS: 1000.0000 [IU] | INTRAMUSCULAR | Status: DC | PRN

## 2023-01-16 NOTE — Procedures (Signed)
Patient was seen on dialysis and the procedure was supervised.  BFR 400  Via AVF BP is  135/82.   Patient appears to be tolerating treatment well.  [name] [name] [name] [name] 01/16/2023

## 2023-01-16 NOTE — Progress Notes (Signed)
Received patient in bed to unit.  Alert and oriented.  Informed consent signed and in chart.   TX duration: 3 hours and 17 minutes  Patient's dialysis cartridge tried clotting off a 2nd time.  Patient self terminated session and signed AMA paperwork.  Dr. Ronalee Belts informed.  Patient tolerated well.  Transported back to the room  Alert, without acute distress.  Hand-off given to patient's nurse.   Access used: Left upper arm fistula Access issues: clotting issue to   Total UF removed: 2.4L Medication(s) given: none   01/16/23 1200  Vitals  Temp (!) 97.4 F (36.3 C)  Temp Source Oral  BP (!) 152/89  MAP (mmHg) 107  Pulse Rate 89  ECG Heart Rate 90  Resp 20  Oxygen Therapy  SpO2 98 %  O2 Device Room Air  During Treatment Monitoring  Duration of HD Treatment -hour(s) 3.28 hour(s) (3 hours and 17 minutes)  HD Safety Checks Performed Yes  Intra-Hemodialysis Comments See progress note (Patient's cartridge clotted off for second time.  Patient signed AMA paperwork to be done now.  Dr. Ronalee Belts informed.)  Dialysis Fluid Bolus Normal Saline  Bolus Amount (mL) 300 mL  Post Treatment  Dialyzer Clearance Lightly streaked  Liters Processed 65.3  Fluid Removed (mL) 2400 mL  Fistula / Graft Left Upper arm Arteriovenous fistula  Placement Date/Time: 07/23/19 1052   Placed prior to admission: No  Orientation: Left  Access Location: Upper arm  Access Type: (c) Arteriovenous fistula  Status Deaccessed     Stacie Glaze LPN [facility]

## 2023-01-16 NOTE — Progress Notes (Signed)
D/C order noted. Contacted [facility] to be advised of pt's d/c today and that pt should resume care on Friday.   Olivia Canter Renal Navigator [phone]

## 2023-01-16 NOTE — Discharge Planning (Signed)
[facility] Patient Discharge Orders- [facility] CLINIC: [facility]  Patient's name: [name] [name] Admit/DC Dates: 01/13/2023 - 01/16/23  Discharge Diagnoses: Seizure, new onset    Aranesp: Given: no Last Hgb: 10.4 PRBC's Given: no  ESA dose for discharge: no change IV Iron dose at discharge: no change  Heparin change: no  EDW Change: yes New EDW: 95kg  Bath Change: no  Access intervention/Change: no Details:  Hectorol/Calcitriol change: no  Discharge Labs: Calcium 8.2 Phosphorus 6.1 Albumin 3.0 K+ 4.0  Start ONSP if not already started.  IV Antibiotics: no Details:  On Coumadin?: no :   OTHER/APPTS/LAB ORDERS:    D/C Meds to be reconciled by nurse after every discharge.  Completed By: Virgina Norfolk, PA-C   Reviewed by: MD:______ RN_______

## 2023-01-16 NOTE — Hospital Course (Addendum)
Brief Narrative:  62 year old with history of HTN, DM2, ESRD on HD MWF, depression, GERD, HLD transferred from [facility] for evaluation of seizure.  At [facility] routine EEG was notable for right temporal sharp waves, CT head negative.  Patient was loaded with fosphenytoin and started on phenytoin.  Patient remained postictal therefore transferred here.  Patient was seen by neurology, IV phenytoin was switched to Keppra.  MRI brain was negative and eventually neurology signed off. Today he is doing significantly well, tolerated HD today as well.  Progressing with physical therapy, will arrange for home health services.  Assessment & Plan:  Principal Problem:   Seizure (HCC) Active Problems:   DM (diabetes mellitus), type 2 with neurological complications (HCC)   Essential hypertension   GERD (gastroesophageal reflux disease)   Diabetic neuropathy associated with type 2 diabetes mellitus (HCC)   ESRD on dialysis (HCC)   Seizures, new onset - New onset, transferred here from [facility] as mentioned above.  Possibly in the setting of missed dialysis.  MRI brain did not show acute pathology but did show chronic infarct.  Continuous EEG showed generalized slowing.  Patient is now on Keppra 500 mg daily and 250 mg after each dialysis session.  Follow-up outpatient neurology.  ESRD on hemodialysis -Nephrology following, HD today.  She can go home thereafter  Depression -Does not appear to be any medications  Hypertension -Continue current meds.  IV as needed  Diabetes mellitus type 2 - A1c 7.  Resume home regimen  Hyperlipidemia -Statin PT/OT-home health services arranged.  DVT prophylaxis: heparin injection 5,000 Units Start: 01/13/23 1400 Code Status: Full code Family Communication: Daughters updated by me. Status is: Inpatient Remains inpatient appropriate because: Discharge today    Subjective: Seen well no complaints at the moment seen on her  dialysis.   Examination:  General exam: Appears calm and comfortable  Respiratory system: Clear to auscultation. Respiratory effort normal. Cardiovascular system: S1 & S2 heard, RRR. No JVD, murmurs, rubs, gallops or clicks. No pedal edema. Gastrointestinal system: Abdomen is nondistended, soft and nontender. No organomegaly or masses felt. Normal bowel sounds heard. Central nervous system: Alert and oriented. No focal neurological deficits. Extremities: Symmetric 5 x 5 power. Skin: No rashes, lesions or ulcers Psychiatry: Judgement and insight appear normal. Mood & affect appropriate.

## 2023-01-16 NOTE — Discharge Summary (Addendum)
Physician Discharge Summary  [name] [name]:[id number] DOB: [birth year] DOA: 01/13/2023  PCP: Shelle Iron, MD  Admit date: 01/13/2023 Discharge date: 01/16/2023  Admitted From: Home Disposition: Home  Recommendations for Outpatient Follow-up:  Follow up with PCP in 1-2 weeks Please obtain BMP/CBC in one week your next doctors visit.  Keppra 500 mg daily, 250 mg after each dialysis session She has been advised against driving.  Should be seizure-free for minimum 6 months. Follow-up outpatient neurology   Discharge Condition: Stable CODE STATUS: Diabetic Diet recommendation: Renal/diabetic  Brief/Interim Summary: Brief Narrative:  62 year old with history of HTN, DM2, ESRD on HD MWF, depression, GERD, HLD transferred from [name] for evaluation of seizure.  At [facility] routine EEG was notable for right temporal sharp waves, CT head negative.  Patient was loaded with fosphenytoin and started on phenytoin.  Patient remained postictal therefore transferred here.  Patient was seen by neurology, IV phenytoin was switched to Keppra.  MRI brain was negative and eventually neurology signed off. Today he is doing significantly well, tolerated HD today as well.  Progressing with physical therapy, will arrange for home health services.  Assessment & Plan:  Principal Problem:   Seizure (HCC) Active Problems:   DM (diabetes mellitus), type 2 with neurological complications (HCC)   Essential hypertension   GERD (gastroesophageal reflux disease)   Diabetic neuropathy associated with type 2 diabetes mellitus (HCC)   ESRD on dialysis (HCC)   Seizures, new onset - New onset, transferred here from [facility] as mentioned above.  Possibly in the setting of missed dialysis.  MRI brain did not show acute pathology but did show chronic infarct.  Continuous EEG showed generalized slowing.  Patient is now on Keppra 500 mg daily and 250 mg after each dialysis session.  Follow-up  outpatient neurology.  ESRD on hemodialysis -Nephrology following, HD today.  She can go home thereafter  Depression -Does not appear to be any medications  Hypertension -Continue current meds.  IV as needed  Diabetes mellitus type 2 - A1c 7.  Resume home regimen  Hyperlipidemia -Statin PT/OT-home health services arranged.  DVT prophylaxis: heparin injection 5,000 Units Start: 01/13/23 1400 Code Status: Full code Family Communication: Daughters updated by me. Status is: Inpatient Remains inpatient appropriate because: Discharge today    Subjective: Seen well no complaints at the moment seen on her dialysis.   Examination:  General exam: Appears calm and comfortable  Respiratory system: Clear to auscultation. Respiratory effort normal. Cardiovascular system: S1 & S2 heard, RRR. No JVD, murmurs, rubs, gallops or clicks. No pedal edema. Gastrointestinal system: Abdomen is nondistended, soft and nontender. No organomegaly or masses felt. Normal bowel sounds heard. Central nervous system: Alert and oriented. No focal neurological deficits. Extremities: Symmetric 5 x 5 power. Skin: No rashes, lesions or ulcers Psychiatry: Judgement and insight appear normal. Mood & affect appropriate.    Discharge Diagnoses:  Principal Problem:   Seizure (HCC) Active Problems:   DM (diabetes mellitus), type 2 with neurological complications (HCC)   Essential hypertension   GERD (gastroesophageal reflux disease)   Diabetic neuropathy associated with type 2 diabetes mellitus (HCC)   ESRD on dialysis [facility])      Consultations: Nephrology Neurology  Subjective: Feeling well no complaints  Discharge Exam: Vitals:   01/16/23 1200 01/16/23 1244  BP: (!) 152/89 (!) 151/104  Pulse: 89 94  Resp: 20 12  Temp: (!) 97.4 F (36.3 C) 98 F (36.7 C)  SpO2: 98% 98%   Vitals:  01/16/23 1131 01/16/23 1200 01/16/23 1211 01/16/23 1244  BP: 135/82 (!) 152/89  (!) 151/104  Pulse: 91 89   94  Resp: 15 20  12   Temp:  (!) 97.4 F (36.3 C)  98 F (36.7 C)  TempSrc:  Oral  Oral  SpO2: 98% 98%  98%  Weight:   93.7 kg   Height:        General: Pt is alert, awake, not in acute distress Cardiovascular: RRR, S1/S2 +, no rubs, no gallops Respiratory: CTA bilaterally, no wheezing, no rhonchi Abdominal: Soft, NT, ND, bowel sounds + Extremities: no edema, no cyanosis  Discharge Instructions   Allergies as of 01/16/2023       Reactions   Hydrochlorothiazide Itching   Amlodipine    Foot swelling/chest pain   Duloxetine    Hallucinations    Lisinopril Cough   Metformin    Unknown reaction   Codeine Itching   No hives or rash, takes benadryl   Dilaudid [hydromorphone Hcl] Itching   "mild itching hrs after dilaudid given"        Medication List     TAKE these medications    acetaminophen 500 MG tablet Commonly known as: TYLENOL Take 1,000 mg by mouth every 6 (six) hours as needed for mild pain or headache.   albuterol 108 (90 Base) MCG/ACT inhaler Commonly known as: VENTOLIN HFA Inhale 2 puffs into the lungs every 4 (four) hours as needed for wheezing or shortness of breath.   amLODipine 5 MG tablet Commonly known as: NORVASC Take 5 mg by mouth daily.   calcitRIOL 0.5 MCG capsule Commonly known as: ROCALTROL Take 2 capsules (1 mcg total) by mouth every Monday, Wednesday, and Friday with hemodialysis. What changed: additional instructions   cloNIDine 0.1 MG tablet Commonly known as: CATAPRES Take 0.1-0.2 mg by mouth 2 (two) times daily.   colestipol 1 g tablet Commonly known as: COLESTID Take 0.5 g by mouth 2 (two) times daily.   cyclobenzaprine 10 MG tablet Commonly known as: FLEXERIL Take 10 mg by mouth 2 (two) times daily as needed for muscle spasms.   famotidine 20 MG tablet Commonly known as: PEPCID Take 1 tablet (20 mg total) by mouth daily. Start taking on: January 17, 2023 What changed:  medication strength how much to take when to  take this   furosemide 80 MG tablet Commonly known as: LASIX Take 1 tablet (80 mg total) by mouth daily. Start taking on: January 17, 2023 What changed: when to take this   gabapentin 100 MG capsule Commonly known as: NEURONTIN Take 1 capsule (100 mg total) by mouth daily. Start taking on: January 17, 2023 What changed:  medication strength how much to take when to take this   HumuLIN N 100 UNIT/ML injection Generic drug: insulin NPH Human Inject 15 Units into the skin daily before breakfast.   ibuprofen 600 MG tablet Commonly known as: ADVIL Take 600 mg by mouth every 6 (six) hours as needed.   isosorbide-hydrALAZINE 20-37.5 MG tablet Commonly known as: BIDIL Take 1 tablet by mouth 3 (three) times daily. What changed: when to take this   labetalol 300 MG tablet Commonly known as: NORMODYNE Take 1 tablet (300 mg total) by mouth 3 (three) times daily. What changed:  medication strength how much to take when to take this   lanthanum 1000 MG chewable tablet Commonly known as: FOSRENOL Chew 1,000 mg by mouth 3 (three) times daily.   levETIRAcetam 250 MG tablet Commonly known  as: KEPPRA Take 2 tablets (500 mg total) by mouth daily AND 1 tablet (250 mg total) every Monday, Wednesday, and Friday with hemodialysis.   lidocaine 5 % Commonly known as: LIDODERM Place 1 patch onto the skin daily.   nitroGLYCERIN 0.4 MG SL tablet Commonly known as: NITROSTAT Place 0.4 mg under the tongue every 5 (five) minutes as needed for chest pain.   Ozempic (0.25 or 0.5 MG/DOSE) 2 MG/3ML Sopn Generic drug: Semaglutide(0.25 or 0.5MG /DOS) Inject 0.25 mg into the skin once a week. Monday 01/07/2023 last injection.        Follow-up Information     [facility] Follow up.   Specialty: Home Health Services Why: HHPT/OT/aide arranged- they will contact you to schedule Contact information: [address] [place] [address] [phone]                 Allergies  Allergen Reactions   Hydrochlorothiazide Itching   Amlodipine     Foot swelling/chest pain    Duloxetine     Hallucinations    Lisinopril Cough   Metformin     Unknown reaction   Codeine Itching    No hives or rash, takes benadryl   Dilaudid [Hydromorphone Hcl] Itching    "mild itching hrs after dilaudid given"    You were cared for by a hospitalist during your hospital stay. If you have any questions about your discharge medications or the care you received while you were in the hospital after you are discharged, you can call the unit and asked to speak with the hospitalist on call if the hospitalist that took care of you is not available. Once you are discharged, your primary care physician will handle any further medical issues. Please note that no refills for any discharge medications will be authorized once you are discharged, as it is imperative that you return to your primary care physician (or establish a relationship with a primary care physician if you do not have one) for your aftercare needs so that they can reassess your need for medications and monitor your lab values.  You were cared for by a hospitalist during your hospital stay. If you have any questions about your discharge medications or the care you received while you were in the hospital after you are discharged, you can call the unit and asked to speak with the hospitalist on call if the hospitalist that took care of you is not available. Once you are discharged, your primary care physician will handle any further medical issues. Please note that NO REFILLS for any discharge medications will be authorized once you are discharged, as it is imperative that you return to your primary care physician (or establish a relationship with a primary care physician if you do not have one) for your aftercare needs so that they can reassess your need for medications and monitor your lab values.  Please request  your Prim.MD to go over all Hospital Tests and Procedure/Radiological results at the follow up, please get all Hospital records sent to your Prim MD by signing hospital release before you go home.  Get CBC, CMP, 2 view Chest X ray checked  by Primary MD during your next visit or SNF MD in 5-7 days ( we routinely change or add medications that can affect your baseline labs and fluid status, therefore we recommend that you get the mentioned basic workup next visit with your PCP, your PCP may decide not to get  them or add new tests based on their clinical decision)  On your next visit with your primary care physician please Get Medicines reviewed and adjusted.  If you experience worsening of your admission symptoms, develop shortness of breath, life threatening emergency, suicidal or homicidal thoughts you must seek medical attention immediately by calling 911 or calling your MD immediately  if symptoms less severe.  You Must read complete instructions/literature along with all the possible adverse reactions/side effects for all the Medicines you take and that have been prescribed to you. Take any new Medicines after you have completely understood and accpet all the possible adverse reactions/side effects.   Do not drive, operate heavy machinery, perform activities at heights, swimming or participation in water activities or provide baby sitting services if your were admitted for syncope or siezures until you have seen by Primary MD or a Neurologist and advised to do so again.  Do not drive when taking Pain medications.   Procedures/Studies: DG Shoulder Left Port  Result Date: 01/16/2023 CLINICAL DATA:  Left shoulder tenderness and pain with motion. EXAM: LEFT SHOULDER COMPARISON:  None Available. FINDINGS: There is no evidence of fracture or dislocation. There is no evidence of arthropathy or other focal bone abnormality. Soft tissues are unremarkable. IMPRESSION: Negative. Electronically Signed    By: Amie Portland M.D.   On: 01/16/2023 10:09   MR BRAIN WO CONTRAST  Result Date: 01/14/2023 CLINICAL DATA:  Provided history: Seizure, new onset, no history of trauma. EXAM: MRI HEAD WITHOUT CONTRAST TECHNIQUE: Multiplanar, multiecho pulse sequences of the brain and surrounding structures were obtained without intravenous contrast. COMPARISON:  Brain MRI 10/19/2021. FINDINGS: Brain: No age advanced or lobar predominant parenchymal atrophy. Small chronic lacunar infarcts within the left caudate nucleus. Mild multifocal T2 FLAIR hyperintense signal abnormality within the cerebral white matter and pons. Findings are similar to the prior brain MRI of 10/19/2021. No cortical encephalomalacia is identified. No appreciable hippocampal size or signal asymmetry. There is no acute infarct. No evidence of an intracranial mass. No chronic intracranial blood products. No extra-axial fluid collection. No midline shift. Vascular: Maintained flow voids within the proximal large arterial vessels. Skull and upper cervical spine: No focal worrisome marrow lesion. Sinuses/Orbits: No mass or acute finding within the imaged orbits. Prior bilateral ocular lens replacement. No significant paranasal sinus disease. Other: Trace fluid within the left mastoid air cells. Asymmetric T2 hyperintense signal within the left petrous apex, unchanged from the prior MRI and likely reflecting trapped fluid (series 4, image 8). IMPRESSION: 1. No evidence of an acute intracranial abnormality. 2. 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 3. Mild multifocal T2 FLAIR hyperintense signal abnormality within the cerebral white matter and pons, nonspecific but likely reflecting chronic small vessel ischemic disease given the patient's history of diabetes mellitus, hypertension and hypercholesterolemia. 4. Otherwise unremarkable non-contrast MRI appearance of the brain. 5. Trapped fluid  suspected within the left petrous apex. Electronically Signed   By: Jackey Loge D.O.   On: 01/14/2023 12:06   Overnight EEG with video  Result Date: 01/14/2023 Charlsie Quest, MD     01/14/2023  3:16 PM Patient Name: [name] [name] MRN: [id number] Epilepsy Attending: Charlsie Quest Referring Physician/Provider: Wynell Balloon, NP Duration: 01/13/2023 1634 to 01/14/2023 1422 Patient history: 62 yo female with PMH of ESRD on HD, HTN, DM2, depression, GERD with previous ulcer, IDA, hx of Hep C, and HLD. Presents to [employer]  as transfer from [facility] for new onset seizure in the setting of missed HD doses x 2. EEG to evaluate for seizure Level of alertness: Awake, asleep AEDs during EEG study: GBP, PHT Technical aspects: This EEG study was done with scalp electrodes positioned according to the 10-20 International system of electrode placement. Electrical activity was reviewed with band pass filter of 1-70Hz , sensitivity of 7 uV/mm, display speed of 69mm/sec with a 60Hz  notched filter applied as appropriate. EEG data were recorded continuously and digitally stored.  Video monitoring was available and reviewed as appropriate. Description: The posterior dominant rhythm consists of 7.5 Hz activity of moderate voltage (25-35 uV) seen predominantly in posterior head regions, symmetric and reactive to eye opening and eye closing. Sleep was characterized by vertex waves, sleep spindles (12 to 14 Hz), maximal frontocentral region. EEG showed continuous generalized 5 to 7 Hz theta-delta slowing. Hyperventilation and photic stimulation were not performed.   ABNORMALITY - Continuous slow, generalized IMPRESSION: This study is suggestive of mild diffuse encephalopathy. No seizures or epileptiform discharges were seen throughout the recording. Charlsie Quest     The results of significant diagnostics from this hospitalization (including imaging, microbiology, ancillary and laboratory) are listed below for  reference.     Microbiology: Recent Results (from the past 240 hour(s))  MRSA Next Gen by PCR, Nasal     Status: None   Collection Time: 01/13/23  2:22 PM   Specimen: Nasal Mucosa; Nasal Swab  Result Value Ref Range Status   MRSA by PCR Next Gen NOT DETECTED NOT DETECTED Final    Comment: (NOTE) The GeneXpert MRSA Assay (FDA approved for NASAL specimens only), is one component of a comprehensive MRSA colonization surveillance program. It is not intended to diagnose MRSA infection nor to guide or monitor treatment for MRSA infections. Test performance is not FDA approved in patients less than 45 years old. Performed at [facility], [address], [place], [place] [address]      Labs: BNP (last 3 results) No results for input(s): "BNP" in the last 8760 hours. Basic Metabolic Panel: Recent Labs  Lab 01/13/23 1357 01/14/23 0439 01/15/23 0336 01/15/23 1659 01/16/23 0321  NA 131* 132* 135  --  134*  K 5.1 4.3 4.0  --  4.0  CL 93* 97* 96*  --  96*  CO2 23 22 27   --  24  GLUCOSE 449* 344* 344* 407* 125*  BUN 36* 48* 24*  --  46*  CREATININE 6.71* 7.51* 4.81*  --  6.95*  CALCIUM 8.4* 8.3* 8.6*  --  8.2*  MG 1.8  --   --   --   --   PHOS 6.8* 6.1*  --   --   --    Liver Function Tests: Recent Labs  Lab 01/13/23 1357 01/14/23 0439  ALBUMIN 3.2* 3.0*   No results for input(s): "LIPASE", "AMYLASE" in the last 168 hours. No results for input(s): "AMMONIA" in the last 168 hours. CBC: Recent Labs  Lab 01/13/23 1357 01/14/23 0439 01/15/23 0336 01/16/23 0321  WBC 20.4* 13.8* 12.6* 13.1*  NEUTROABS 17.8*  --   --   --   HGB 10.8* 10.0* 10.5* 10.4*  HCT 30.9* 28.4* 29.9* 29.7*  MCV 84.9 84.8 84.2 84.1  PLT 235 220 217 214   Cardiac Enzymes: Recent Labs  Lab 01/13/23 1357  CKTOTAL 880*   BNP: Invalid input(s): "POCBNP" CBG: Recent Labs  Lab 01/15/23 1115 01/15/23 1622 01/15/23 2129 01/16/23 6045  01/16/23 1244  GLUCAP 102* 404* 123* 177* 180*    D-Dimer No results for input(s): "DDIMER" in the last 72 hours. Hgb A1c No results for input(s): "HGBA1C" in the last 72 hours.  Lipid Profile No results for input(s): "CHOL", "HDL", "LDLCALC", "TRIG", "CHOLHDL", "LDLDIRECT" in the last 72 hours. Thyroid function studies No results for input(s): "TSH", "T4TOTAL", "T3FREE", "THYROIDAB" in the last 72 hours.  Invalid input(s): "FREET3" Anemia work up No results for input(s): "VITAMINB12", "FOLATE", "FERRITIN", "TIBC", "IRON", "RETICCTPCT" in the last 72 hours. Urinalysis    Component Value Date/Time   COLORURINE YELLOW 07/02/2019 2350   APPEARANCEUR CLEAR 07/02/2019 2350   LABSPEC 1.014 07/02/2019 2350   PHURINE 6.0 07/02/2019 2350   GLUCOSEU 150 (A) 07/02/2019 2350   HGBUR NEGATIVE 07/02/2019 2350   BILIRUBINUR NEGATIVE 07/02/2019 2350   KETONESUR NEGATIVE 07/02/2019 2350   PROTEINUR >=300 (A) 07/02/2019 2350   UROBILINOGEN 0.2 10/22/2013 1757   NITRITE NEGATIVE 07/02/2019 2350   LEUKOCYTESUR TRACE (A) 07/02/2019 2350   Sepsis Labs Recent Labs  Lab 01/13/23 1357 01/14/23 0439 01/15/23 0336 01/16/23 0321  WBC 20.4* 13.8* 12.6* 13.1*   Microbiology Recent Results (from the past 240 hour(s))  MRSA Next Gen by PCR, Nasal     Status: None   Collection Time: 01/13/23  2:22 PM   Specimen: Nasal Mucosa; Nasal Swab  Result Value Ref Range Status   MRSA by PCR Next Gen NOT DETECTED NOT DETECTED Final    Comment: (NOTE) The GeneXpert MRSA Assay (FDA approved for NASAL specimens only), is one component of a comprehensive MRSA colonization surveillance program. It is not intended to diagnose MRSA infection nor to guide or monitor treatment for MRSA infections. Test performance is not FDA approved in patients less than 29 years old. Performed at [facility], [address] [place], [place] [address]      Time coordinating discharge:  I have spent 35 minutes face to face with the patient and on the ward  discussing the patients care, assessment, plan and disposition with other care givers. >50% of the time was devoted counseling the patient about the risks and benefits of treatment/Discharge disposition and coordinating care.   SIGNED:   Miguel Rota, MD  [facility] 01/16/2023, 2:42 PM   If 7PM-7AM, please contact night-coverage

## 2023-01-16 NOTE — TOC Transition Note (Signed)
Transition of Care (TOC) - CM/SW Discharge Note Donn Pierini RN, BSN Transitions of Care Unit 4E- RN Case Manager See Treatment Team for direct phone #   Patient Details  Name: [name] [name] MRN: [id number] Date of Birth: 01-13-1961  Transition of Care [facility]) CM/SW Contact:  Darrold Span, RN Phone Number: 01/16/2023, 1:32 PM   Clinical Narrative:    Pt stable for transition home today, Orders placed for HHPTOT/aide.   CM in to speak with pt at bedside, daughter also present.  Discussed HH orders and list provided for [facility] choice Per CMS guidelines from [email or web address] website with star ratings (copy placed in shadow chart)- pt voiced she has had HH in past and would like "[facility]" again- daughter placed call to [facility] to see which agency she works with-  Per TC w/ [facility] she is an Engineer, production w/ [employer]- agency does not have skilled services for PT/OT- pt will not be able to use this agency for skilled PT/OT needs- this explained to pt and daughter. After review of list- daughter selected [facility] services.  Pt voiced she has RW at home- pt voiced she has no other DME needs at this time.   Daughter to provide transport home.   Call made to [facility] liaison-Lynette- for [facility] referral- referral has been accepted for PT/OT/aide needs. They will contact pt for scheduling start of care visit within 48 hr.   No further TOC needs noted.    Final next level of care: Home w Home Health Services Barriers to Discharge: No Barriers Identified   Patient Goals and CMS Choice CMS Medicare.gov Compare Post Acute Care list provided to:: Patient Choice offered to / list presented to : Patient, Adult Children  Discharge Placement                 Home w/ [facility]        Discharge Plan and Services Additional resources added to the After Visit Summary for     Discharge Planning Services: CM Consult Post Acute Care Choice: Home Health          DME Arranged: N/A DME Agency: NA        HH Arranged: PT, OT, Nurse's Aide HH Agency: [facility] Date [facility] Agency Contacted: 01/16/23 Time HH Agency Contacted: 1332 Representative spoke with at [facility] Agency: [facility]  Social Determinants of Health (SDOH) Interventions SDOH Screenings   Food Insecurity: No Food Insecurity (01/13/2023)  Housing: Low Risk  (01/13/2023)  Transportation Needs: No Transportation Needs (01/13/2023)  Utilities: Not At Risk (01/13/2023)  Tobacco Use: Low Risk  (01/13/2023)     Readmission Risk Interventions    01/16/2023    1:32 PM  Readmission Risk Prevention Plan  Transportation Screening Complete  Home Care Screening Complete  Medication Review (RN CM) Complete

## 2023-01-17 ENCOUNTER — Telehealth: Payer: Self-pay | Admitting: Nephrology

## 2023-01-17 NOTE — Telephone Encounter (Signed)
Transition of Care Contact from Inpatient Facility  Date of Discharge: 01/16/23 Date of Contact: 01/17/23 Method of contact: phone - attempted  Attempted to contact patient to discuss transition of care from inpatient admission.  Patient did not answer the phone.  Will attempt to call them again and if unable to reach will follow up at dialysis.  Virgina Norfolk, PA-C [employer]

## 2023-01-21 ENCOUNTER — Telehealth: Payer: Self-pay | Admitting: Physician Assistant

## 2023-01-21 NOTE — Telephone Encounter (Signed)
Transition of care contact from inpatient facility  Date of Discharge: 01/16/23 Date of Contact: 01/21/23 Method of contact: Phone  Attempted to contact patient to discuss transition of care from inpatient admission. Spoke with patient's husband. He reports she did not go to dialysis today because she was having bilateral shoulder pain. Went to the ED and is now resting. He reports they plan to follow up with her neurologist.  Reviewed med changes. Confirmed she is now also taking keppra 250mg  post HD.  Patient has home health aide, PT and OT ordered. Husband reports they called today and plan to come out tomorrow.   No other needs identified at this time. Will follow up with patient at her dialysis unit.  Rogers Blocker, PA-C 01/21/2023, 2:34 PM  [facility] Pager: [phone]

## 2023-03-07 ENCOUNTER — Other Ambulatory Visit: Payer: Self-pay | Admitting: Physician Assistant

## 2023-04-22 ENCOUNTER — Telehealth: Payer: Self-pay

## 2023-04-22 NOTE — Telephone Encounter (Signed)
 Spoke with pt who states that she has been having chest pain for the past few days. Pt states that today while on dialysis she had chest pain that radiated from her chest into her neck and down her left arm. Advised that we could call 911 but she needed to be in the ED for an evaluation. Pt declined EMS and states that her nephew would drive her. Advised that this is concerning sx and she needed urgent testing. Pt verbalized understanding and agreed to plan to go to [facility] ED.

## 2023-05-06 DIAGNOSIS — R079 Chest pain, unspecified: Secondary | ICD-10-CM | POA: Diagnosis not present

## 2023-07-17 DIAGNOSIS — R079 Chest pain, unspecified: Secondary | ICD-10-CM | POA: Diagnosis not present

## 2023-07-24 ENCOUNTER — Emergency Department (HOSPITAL_COMMUNITY)
Admission: EM | Admit: 2023-07-24 | Discharge: 2023-07-25 | Disposition: A | Discharge status: Home / Self Care | Attending: Emergency Medicine | Admitting: Emergency Medicine

## 2023-07-24 ENCOUNTER — Other Ambulatory Visit: Payer: Self-pay

## 2023-07-24 ENCOUNTER — Emergency Department (HOSPITAL_COMMUNITY)

## 2023-07-24 ENCOUNTER — Encounter (HOSPITAL_COMMUNITY): Payer: Self-pay

## 2023-07-24 DIAGNOSIS — Z992 Dependence on renal dialysis: Secondary | ICD-10-CM | POA: Insufficient documentation

## 2023-07-24 DIAGNOSIS — Z79899 Other long term (current) drug therapy: Secondary | ICD-10-CM | POA: Insufficient documentation

## 2023-07-24 DIAGNOSIS — I120 Hypertensive chronic kidney disease with stage 5 chronic kidney disease or end stage renal disease: Secondary | ICD-10-CM | POA: Diagnosis not present

## 2023-07-24 DIAGNOSIS — E1122 Type 2 diabetes mellitus with diabetic chronic kidney disease: Secondary | ICD-10-CM | POA: Diagnosis not present

## 2023-07-24 DIAGNOSIS — R519 Headache, unspecified: Secondary | ICD-10-CM | POA: Insufficient documentation

## 2023-07-24 DIAGNOSIS — I1A0 Resistant hypertension: Secondary | ICD-10-CM | POA: Insufficient documentation

## 2023-07-24 DIAGNOSIS — R079 Chest pain, unspecified: Secondary | ICD-10-CM | POA: Diagnosis present

## 2023-07-24 DIAGNOSIS — N186 End stage renal disease: Secondary | ICD-10-CM | POA: Diagnosis not present

## 2023-07-24 LAB — CBC
HCT: 32.1 % — ABNORMAL LOW (ref 36.0–46.0)
Hemoglobin: 11.3 g/dL — ABNORMAL LOW (ref 12.0–15.0)
MCH: 30 pg (ref 26.0–34.0)
MCHC: 35.2 g/dL (ref 30.0–36.0)
MCV: 85.1 fL (ref 80.0–100.0)
Platelets: 329 10*3/uL (ref 150–400)
RBC: 3.77 MIL/uL — ABNORMAL LOW (ref 3.87–5.11)
RDW: 20.3 % — ABNORMAL HIGH (ref 11.5–15.5)
WBC: 14.3 10*3/uL — ABNORMAL HIGH (ref 4.0–10.5)
nRBC: 0 % (ref 0.0–0.2)

## 2023-07-24 LAB — TROPONIN I (HIGH SENSITIVITY)
Troponin I (High Sensitivity): 34 ng/L — ABNORMAL HIGH (ref <18)
Troponin I (High Sensitivity): 34 ng/L — ABNORMAL HIGH (ref <18)

## 2023-07-24 LAB — BASIC METABOLIC PANEL WITH GFR
Anion gap: 16 — ABNORMAL HIGH (ref 5–15)
BUN: 79 mg/dL — ABNORMAL HIGH (ref 8–23)
CO2: 23 mmol/L (ref 22–32)
Calcium: 8.1 mg/dL — ABNORMAL LOW (ref 8.9–10.3)
Chloride: 95 mmol/L — ABNORMAL LOW (ref 98–111)
Creatinine, Ser: 8.46 mg/dL — ABNORMAL HIGH (ref 0.44–1.00)
GFR, Estimated: 5 mL/min — ABNORMAL LOW (ref >60)
Glucose, Bld: 201 mg/dL — ABNORMAL HIGH (ref 70–99)
Potassium: 5 mmol/L (ref 3.5–5.1)
Sodium: 134 mmol/L — ABNORMAL LOW (ref 135–145)

## 2023-07-24 MED ORDER — LABETALOL HCL 5 MG/ML IV SOLN: 20.0000 mg | Freq: Once | INTRAVENOUS | Status: DC

## 2023-07-24 MED ORDER — IOHEXOL 350 MG/ML SOLN
100.0000 mL | Freq: Once | INTRAVENOUS | Status: AC | PRN
  Administered 2023-07-24: 100 mL via INTRAVENOUS

## 2023-07-24 MED ORDER — LABETALOL HCL 5 MG/ML IV SOLN
10.0000 mg | Freq: Once | INTRAVENOUS | Status: AC
  Administered 2023-07-24: 10 mg via INTRAVENOUS
  Filled 2023-07-24: qty 4

## 2023-07-24 MED ORDER — AMLODIPINE BESYLATE 5 MG PO TABS
10.0000 mg | ORAL_TABLET | Freq: Once | ORAL | Status: AC
  Administered 2023-07-24: 10 mg via ORAL
  Filled 2023-07-24: qty 2

## 2023-07-24 MED ORDER — HYDRALAZINE HCL 25 MG PO TABS: 50.0000 mg | ORAL_TABLET | Freq: Once | ORAL | Status: DC

## 2023-07-24 MED ORDER — LABETALOL HCL 200 MG PO TABS
300.0000 mg | ORAL_TABLET | Freq: Once | ORAL | Status: AC
  Administered 2023-07-25: 300 mg via ORAL
  Filled 2023-07-24: qty 2

## 2023-07-24 MED ORDER — ACETAMINOPHEN 500 MG PO TABS
1000.0000 mg | ORAL_TABLET | Freq: Once | ORAL | Status: AC
  Administered 2023-07-25: 1000 mg via ORAL
  Filled 2023-07-24: qty 2

## 2023-07-24 NOTE — ED Provider Triage Note (Signed)
 Emergency Medicine Provider Triage Evaluation Note  SAVANHA ISLAND , a 63 y.o. female  was evaluated in triage.  Pt complains of chest pain.  Started 2 weeks ago but has been much worse in the last couple days.  It is central and left-sided but does radiate to the back and left arm.  She states it feels like someone sitting on my chest.  Endorses associated shortness of breath.  She also states she has a really bad headache.  Denies visual disturbance.  Last dialysis treatment was Monday.  History of ESRD.  Review of Systems  Positive: See above Negative: See above  Physical Exam  BP (!) 211/85   Pulse 95   Temp 98 F (36.7 C)   Resp 16   Ht 5' 5 (1.651 m)   Wt 95.3 kg   SpO2 98%   BMI 34.95 kg/m  Gen:   Awake, no distress   Resp:  Normal effort  MSK:   Moves extremities without difficulty  Other:    Medical Decision Making  Medically screening exam initiated at 6:09 PM.  Appropriate orders placed.  Lavell Portugal was informed that the remainder of the evaluation will be completed by another provider, this initial triage assessment does not replace that evaluation, and the importance of remaining in the ED until their evaluation is complete.  Work up started   Janalee Mcmurray, PA-C 07/24/23 1811

## 2023-07-24 NOTE — ED Triage Notes (Addendum)
 Pt was sup[posed to get eye surgery but BP was 200 systolic. C/O CP/shob/ and headache. Denies dizziness/numbness/tingling. Last full dialysis tx was Monday.

## 2023-07-24 NOTE — ED Provider Notes (Signed)
 [place] EMERGENCY DEPARTMENT AT [facility] Provider Note   CSN: [id number] Arrival date & time: 07/24/23  1726     History  Chief Complaint  Patient presents with   Chest Pain    [name] [name] is a 63 y.o. female.  HPI Patient with history of hypertension, type 2 diabetes, ESRD on dialysis, GERD, seizure.  Patient reports she has been experiencing chest pain.  Has been on and off for 2 weeks but getting worse for a few days.  Its central and to the left.  She denies radiation to her back.  Patient reports that some intense pressure sensation.  She has associated shortness of breath.  Patient reports that she has had changes to her blood pressure medications that she does not understand.  Her blood pressure has been very poorly controlled.  Patient reports that she has had side effects to many of her blood pressure medications and has stopped taking some of them due to side effects.  She reports anything with hydralazine  and it makes her itch too much to take.  She reports every time she goes to dialysis her blood pressures are really high and her doctors are aware of this but her medications accurately bring her blood pressure down under 180s systolic.  Patient voices although it is generalized headache no confusion no focal weakness.    Home Medications Prior to Admission medications   Medication Sig Start Date End Date Taking? Authorizing Provider  amLODipine  (NORVASC ) 10 MG tablet Take 1 tablet (10 mg total) by mouth daily. 07/25/23  Yes Wynetta Heckle, MD  isosorbide  dinitrate (ISORDIL ) 20 MG tablet Take 1 tablet (20 mg total) by mouth 3 (three) times daily. 07/25/23  Yes Wynetta Heckle, MD  labetalol  (NORMODYNE ) 300 MG tablet Take 1 tablet (300 mg total) by mouth 3 (three) times daily. 07/25/23  Yes Wynetta Heckle, MD  acetaminophen  (TYLENOL ) 500 MG tablet Take 1,000 mg by mouth every 6 (six) hours as needed for mild pain or headache.    [provider]   albuterol  (VENTOLIN  HFA) 108 (90 Base) MCG/ACT inhaler Inhale 2 puffs into the lungs every 4 (four) hours as needed for wheezing or shortness of breath.     [provider]  amLODipine  (NORVASC ) 5 MG tablet Take 5 mg by mouth daily.    [provider]  calcitRIOL  (ROCALTROL ) 0.5 MCG capsule Take 2 capsules (1 mcg total) by mouth every Monday, Wednesday, and Friday with hemodialysis. Patient taking differently: Take 1 mcg by mouth every Monday, Wednesday, and Friday with hemodialysis. Patient had Dialysis yesterday at [facility] 01/12/2023. 10/23/21   Regalado, Clifford Dam A, MD  cloNIDine  (CATAPRES ) 0.1 MG tablet Take 0.1-0.2 mg by mouth 2 (two) times daily. 12/19/22   [provider]  colestipol (COLESTID) 1 g tablet Take 0.5 g by mouth 2 (two) times daily.    [provider]  cyclobenzaprine  (FLEXERIL ) 10 MG tablet Take 10 mg by mouth 2 (two) times daily as needed for muscle spasms. 03/21/21   [provider]  famotidine  (PEPCID ) 20 MG tablet Take 1 tablet (20 mg total) by mouth daily. 01/17/23   Amin, Ankit C, MD  furosemide  (LASIX ) 80 MG tablet Take 1 tablet (80 mg total) by mouth daily. 01/17/23   Amin, Ankit C, MD  gabapentin  (NEURONTIN ) 100 MG capsule Take 1 capsule (100 mg total) by mouth daily. 01/17/23   Amin, Ankit C, MD  HUMULIN  N 100 UNIT/ML injection Inject 15 Units into the skin  daily before breakfast. 06/26/18   [provider]  ibuprofen (ADVIL) 600 MG tablet Take 600 mg by mouth every 6 (six) hours as needed. 10/29/22   [provider]  isosorbide -hydrALAZINE  (BIDIL ) 20-37.5 MG tablet Take 1 tablet by mouth 3 (three) times daily. Patient taking differently: Take 1 tablet by mouth daily. 10/21/21   Regalado, Belkys A, MD  labetalol  (NORMODYNE ) 300 MG tablet Take 1 tablet (300 mg total) by mouth 3 (three) times daily. 01/16/23   Amin, Ankit C, MD  lanthanum (FOSRENOL) 1000 MG chewable tablet Chew 1,000 mg by mouth 3 (three) times daily.  12/14/22   [provider]  levETIRAcetam  (KEPPRA ) 250 MG tablet Take 2 tablets (500 mg total) by mouth daily AND 1 tablet (250 mg total) every Monday, Wednesday, and Friday with hemodialysis. 01/16/23 02/15/23  Amin, Ankit C, MD  lidocaine  (LIDODERM ) 5 % Place 1 patch onto the skin daily. 12/27/22   [provider]  nitroGLYCERIN  (NITROSTAT ) 0.4 MG SL tablet Place 0.4 mg under the tongue every 5 (five) minutes as needed for chest pain. 01/01/23   [provider]  OZEMPIC, 0.25 OR 0.5 MG/DOSE, 2 MG/3ML SOPN Inject 0.25 mg into the skin once a week. Monday 01/07/2023 last injection. 01/01/23   [provider]      Allergies    Hydrochlorothiazide, Amlodipine , Duloxetine , Lisinopril , Metformin, Codeine, and Dilaudid  [hydromorphone  hcl]    Review of Systems   Review of Systems  Physical Exam Updated Vital Signs BP (!) 165/67 (BP Location: Right Arm)   Pulse 84   Temp 98.4 F (36.9 C) (Oral)   Resp 19   Ht 5' 5 (1.651 m)   Wt 95.3 kg   SpO2 98%   BMI 34.95 kg/m  Physical Exam Constitutional:      Comments: No acute distress.  Mental status is clear.  Speech is clear.  No respiratory distress at rest.  Nontoxic appearance.  HENT:     Head: Normocephalic and atraumatic.     Mouth/Throat:     Pharynx: Oropharynx is clear.   Eyes:     Extraocular Movements: Extraocular movements intact.    Cardiovascular:     Rate and Rhythm: Normal rate and regular rhythm.  Pulmonary:     Effort: Pulmonary effort is normal.     Breath sounds: Normal breath sounds.  Abdominal:     General: There is no distension.     Palpations: Abdomen is soft.     Tenderness: There is no abdominal tenderness. There is no guarding.   Musculoskeletal:        General: Normal range of motion.   Skin:    General: Skin is warm and dry.   Neurological:     General: No focal deficit present.     Mental Status: She is alert and oriented to person, place, and time.     Cranial  Nerves: No cranial nerve deficit.     Sensory: No sensory deficit.     Motor: No weakness.     Coordination: Coordination normal.   Psychiatric:        Mood and Affect: Mood normal.     ED Results / Procedures / Treatments   Labs (all labs ordered are listed, but only abnormal results are displayed) Labs Reviewed  BASIC METABOLIC PANEL WITH GFR - Abnormal; Notable for the following components:      Result Value   Sodium 134 (*)    Chloride 95 (*)    Glucose, Bld  201 (*)    BUN 79 (*)    Creatinine, Ser 8.46 (*)    Calcium  8.1 (*)    GFR, Estimated 5 (*)    Anion gap 16 (*)    All other components within normal limits  CBC - Abnormal; Notable for the following components:   WBC 14.3 (*)    RBC 3.77 (*)    Hemoglobin 11.3 (*)    HCT 32.1 (*)    RDW 20.3 (*)    All other components within normal limits  TROPONIN I (HIGH SENSITIVITY) - Abnormal; Notable for the following components:   Troponin I (High Sensitivity) 34 (*)    All other components within normal limits  TROPONIN I (HIGH SENSITIVITY) - Abnormal; Notable for the following components:   Troponin I (High Sensitivity) 34 (*)    All other components within normal limits    EKG EKG Interpretation Date/Time:  Wednesday July 24 2023 18:02:38 EDT Ventricular Rate:  93 PR Interval:  150 QRS Duration:  82 QT Interval:  374 QTC Calculation: 465 R Axis:   10  Text Interpretation: Normal sinus rhythm Left ventricular hypertrophy with repolarization abnormality ( R in aVL , Sokolow-Lyon , Cornell product ) Abnormal ECG When compared with ECG of 18-Oct-2021 16:48, PREVIOUS ECG IS PRESENT Confirmed by Eve Hinders [phone]) on 07/26/2023 3:17:47 AM  Radiology No results found.   Procedures Procedures   CRITICAL CARE Performed by: Wynetta Heckle   Total critical care time: 30 minutes  Critical care time was exclusive of separately billable procedures and treating other patients.  Critical care was necessary to  treat or prevent imminent or life-threatening deterioration.  Critical care was time spent personally by me on the following activities: development of treatment plan with patient and/or surrogate as well as nursing, discussions with consultants, evaluation of patient's response to treatment, examination of patient, obtaining history from patient or surrogate, ordering and performing treatments and interventions, ordering and review of laboratory studies, ordering and review of radiographic studies, pulse oximetry and re-evaluation of patient's condition.  Medications Ordered in ED Medications  iohexol  (OMNIPAQUE ) 350 MG/ML injection 100 mL (100 mLs Intravenous Contrast Given 07/24/23 2134)  labetalol  (NORMODYNE ) tablet 300 mg (300 mg Oral Given 07/25/23 0004)  labetalol  (NORMODYNE ) injection 10 mg (10 mg Intravenous Given 07/24/23 2328)  amLODipine  (NORVASC ) tablet 10 mg (10 mg Oral Given 07/24/23 2327)  acetaminophen  (TYLENOL ) tablet 1,000 mg (1,000 mg Oral Given 07/25/23 0004)    ED Course/ Medical Decision Making/ A&P                                 Medical Decision Making Risk OTC drugs. Prescription drug management.   Patient presents as outlined.  She has complex medical history including ESRD on dialysis and severe hypertension.  Patient reports both headache and chest pain.  Patient's initial blood pressure is significantly elevated at 211/85.  Patient described multiple blood pressure medication changes and now only taking half her original amlodipine  dose due to swelling in her ankles and not currently taking the labetalol  because she did not think it was effective.  It was significantly difficult to really clarify the pattern with which she is taking blood pressure medications and changes described.  Overall it does sound that there are missed doses and confusion about which medications the patient is actually taking as she describes some medications she does not take because of side  effects  and then other dose changes by multiple providers.    CT scanning of head and CT angio rule out dissection already done with orders from triage evaluation.  At this time no acute findings per radiology interpretation  hypertension treated with labetalol  and amlodipine .  Blood pressures began appropriately trending down and patient's symptoms improved.  She continued to look well.  At no time did she exhibit any encephalopathy.  Patient also does not show any signs of decompensated volume overload.  Lungs remain clear without any respiratory distress.  At this time with blood pressures improved and symptomatically improved with management of hypertension I think the patient symptoms most likely due to uncontrolled hypertension.  In the emergency department, the patient is responding appropriately to antihypertensives.  We extensively discussed the plan for restarting her labetalol  on a consistent schedule dose of 300 mg 3 times a day as well as amlodipine  10 mg daily and isosorbide  three times daily.  Patient voiced understanding and plan to continue with these medications with close follow-up and discussion of management of hypertension with her nephrologist.  At this time patient does not appear to need urgent or emergent dialysis.  Stable for discharge with symptoms controlled and plan in place.        Final Clinical Impression(s) / ED Diagnoses Final diagnoses:  Resistant hypertension  Nonintractable headache, unspecified chronicity pattern, unspecified headache type  Chest pain, unspecified type  ESRD (end stage renal disease) on dialysis [facility])    Rx / DC Orders ED Discharge Orders          Ordered    labetalol  (NORMODYNE ) 300 MG tablet  3 times daily        07/25/23 0008    amLODipine  (NORVASC ) 10 MG tablet  Daily        07/25/23 0008    isosorbide  dinitrate (ISORDIL ) 20 MG tablet  3 times daily        07/25/23 0008              Wynetta Heckle, MD 08/01/23  1527

## 2023-07-25 MED ORDER — ISOSORBIDE DINITRATE 20 MG PO TABS: 20.0000 mg | ORAL_TABLET | Freq: Three times a day (TID) | ORAL | 0 refills | Status: DC

## 2023-07-25 MED ORDER — LABETALOL HCL 300 MG PO TABS: 300.0000 mg | ORAL_TABLET | Freq: Three times a day (TID) | ORAL | 0 refills | Status: DC

## 2023-07-25 MED ORDER — AMLODIPINE BESYLATE 10 MG PO TABS: 10.0000 mg | ORAL_TABLET | Freq: Every day | ORAL | 0 refills | Status: DC

## 2023-07-25 NOTE — Discharge Instructions (Addendum)
 1.  Take the labetalol  as previously prescribed at 300 mg 3 times a day.  You must take this at the same time every day and do not miss doses.  Take amlodipine  (Norvasc ) in the morning when you get up with your first dose of labetalol .  Take isosorbide  dinitrate (Isordil ) 3 times a day with the labetalol . 2.  Your blood pressure medications have been advised this way based on medications you have previously taken.  It is extremely important that you take them at the same time, do not miss doses and measure your blood pressures at home.  Discussed this with your nephrologist because they are the most appropriate for managing your hypertension.  Make sure they agree with this plan and do not need to make any changes. 3.  Return to the emergency department if you have new worsening or concerning symptoms.  Try to manage your diet for controlling blood pressure and fluid retention.

## 2023-07-25 NOTE — ED Provider Notes (Incomplete)
 [name] Provider Note   CSN: [id number] Arrival date & time: 07/24/23  1726     History {Add pertinent medical, surgical, social history, OB history to HPI:1} Chief Complaint  Patient presents with  . Chest Pain    [name] [name] is a 63 y.o. female.  HPI Patient reports she has had chest pain that has been coming and going.  Has been present for about 2 weeks but feels like it is getting worse.  Patient's pain is mostly central with some radiation to the left shoulder.  Sometimes it feels like a heavy weight on her chest.  Patient reports she also intermittently feels short of breath.  Patient is on dialysis and describes multiple changes and irregularities to her antihypertensive medication regimen.  There are medications she reports she does not tolerate well such as hydralazine  citing gives her an incredible amount of itching.  She describes having her physicians changed her medication regimens multiple times.  Patient reports that her blood pressures are quite consistently elevated and is not unusual for her blood pressures to be mostly around 180s when taking her medications.  Patient reports that she has had a generalized headache.  No visual changes.  No weakness no numbness no incoordination or falls.    Home Medications Prior to Admission medications   Medication Sig Start Date End Date Taking? Authorizing Provider  amLODipine  (NORVASC ) 10 MG tablet Take 1 tablet (10 mg total) by mouth daily. 07/25/23  Yes Wynetta Heckle, MD  isosorbide  dinitrate (ISORDIL ) 20 MG tablet Take 1 tablet (20 mg total) by mouth 3 (three) times daily. 07/25/23  Yes Wynetta Heckle, MD  labetalol  (NORMODYNE ) 300 MG tablet Take 1 tablet (300 mg total) by mouth 3 (three) times daily. 07/25/23  Yes Wynetta Heckle, MD  acetaminophen  (TYLENOL ) 500 MG tablet Take 1,000 mg by mouth every 6 (six) hours as needed for mild pain or headache.    [provider]   albuterol  (VENTOLIN  HFA) 108 (90 Base) MCG/ACT inhaler Inhale 2 puffs into the lungs every 4 (four) hours as needed for wheezing or shortness of breath.     [provider]  amLODipine  (NORVASC ) 5 MG tablet Take 5 mg by mouth daily.    [provider]  calcitRIOL  (ROCALTROL ) 0.5 MCG capsule Take 2 capsules (1 mcg total) by mouth every Monday, Wednesday, and Friday with hemodialysis. Patient taking differently: Take 1 mcg by mouth every Monday, Wednesday, and Friday with hemodialysis. Patient had Dialysis yesterday at [facility] 01/12/2023. 10/23/21   Regalado, Clifford Dam A, MD  cloNIDine  (CATAPRES ) 0.1 MG tablet Take 0.1-0.2 mg by mouth 2 (two) times daily. 12/19/22   [provider]  colestipol (COLESTID) 1 g tablet Take 0.5 g by mouth 2 (two) times daily.    [provider]  cyclobenzaprine  (FLEXERIL ) 10 MG tablet Take 10 mg by mouth 2 (two) times daily as needed for muscle spasms. 03/21/21   [provider]  famotidine  (PEPCID ) 20 MG tablet Take 1 tablet (20 mg total) by mouth daily. 01/17/23   Amin, Ankit C, MD  furosemide  (LASIX ) 80 MG tablet Take 1 tablet (80 mg total) by mouth daily. 01/17/23   Amin, Ankit C, MD  gabapentin  (NEURONTIN ) 100 MG capsule Take 1 capsule (100 mg total) by mouth daily. 01/17/23   Maggie Schooner, MD  HUMULIN  N 100 UNIT/ML injection Inject 15 Units into the skin daily before breakfast. 06/26/18   [provider]  ibuprofen (  ADVIL) 600 MG tablet Take 600 mg by mouth every 6 (six) hours as needed. 10/29/22   [provider]  isosorbide -hydrALAZINE  (BIDIL ) 20-37.5 MG tablet Take 1 tablet by mouth 3 (three) times daily. Patient taking differently: Take 1 tablet by mouth daily. 10/21/21   Regalado, Belkys A, MD  labetalol  (NORMODYNE ) 300 MG tablet Take 1 tablet (300 mg total) by mouth 3 (three) times daily. 01/16/23   Amin, Ankit C, MD  lanthanum (FOSRENOL) 1000 MG chewable tablet Chew 1,000 mg by mouth 3 (three) times daily.  12/14/22   [provider]  levETIRAcetam  (KEPPRA ) 250 MG tablet Take 2 tablets (500 mg total) by mouth daily AND 1 tablet (250 mg total) every Monday, Wednesday, and Friday with hemodialysis. 01/16/23 02/15/23  Amin, Ankit C, MD  lidocaine  (LIDODERM ) 5 % Place 1 patch onto the skin daily. 12/27/22   [provider]  nitroGLYCERIN  (NITROSTAT ) 0.4 MG SL tablet Place 0.4 mg under the tongue every 5 (five) minutes as needed for chest pain. 01/01/23   [provider]  OZEMPIC, 0.25 OR 0.5 MG/DOSE, 2 MG/3ML SOPN Inject 0.25 mg into the skin once a week. Monday 01/07/2023 last injection. 01/01/23   [provider]      Allergies    Hydrochlorothiazide, Amlodipine , Duloxetine , Lisinopril , Metformin, Codeine, and Dilaudid  [hydromorphone  hcl]    Review of Systems   Review of Systems  Physical Exam Updated Vital Signs BP (!) 165/67 (BP Location: Right Arm)   Pulse 84   Temp 98.4 F (36.9 C) (Oral)   Resp 19   Ht 5' 5 (1.651 m)   Wt 95.3 kg   SpO2 98%   BMI 34.95 kg/m  Physical Exam Constitutional:      Comments: The patient is alert.  Nontoxic.  No acute distress.  No respiratory distress.  HENT:     Head: Normocephalic.     Mouth/Throat:     Mouth: Mucous membranes are moist.     Pharynx: Oropharynx is clear.  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  She reports she has mostly loss of vision in the right eye.  Cardiovascular:     Rate and Rhythm: Normal rate and regular rhythm.  Pulmonary:     Effort: Pulmonary effort is normal.     Breath sounds: Normal breath sounds.  Abdominal:     General: There is no distension.     Palpations: Abdomen is soft.     Tenderness: There is no abdominal tenderness. There is no guarding.  Musculoskeletal:        General: No swelling or tenderness. Normal range of motion.     Cervical back: Neck supple.     Right lower leg: No edema.     Left lower leg: No edema.   Skin:    General: Skin is warm and dry.  Neurological:     General: No focal deficit present.     Mental Status: She is oriented to person, place, and time.     Cranial Nerves: No cranial nerve deficit.     Motor: No weakness.     Coordination: Coordination normal.  Psychiatric:        Mood and Affect: Mood normal.     ED Results / Procedures / Treatments   Labs (all labs ordered are listed, but only abnormal results are displayed) Labs Reviewed  BASIC METABOLIC PANEL WITH GFR - Abnormal; Notable for the following components:  Result Value   Sodium 134 (*)    Chloride 95 (*)    Glucose, Bld 201 (*)    BUN 79 (*)    Creatinine, Ser 8.46 (*)    Calcium  8.1 (*)    GFR, Estimated 5 (*)    Anion gap 16 (*)    All other components within normal limits  CBC - Abnormal; Notable for the following components:   WBC 14.3 (*)    RBC 3.77 (*)    Hemoglobin 11.3 (*)    HCT 32.1 (*)    RDW 20.3 (*)    All other components within normal limits  TROPONIN I (HIGH SENSITIVITY) - Abnormal; Notable for the following components:   Troponin I (High Sensitivity) 34 (*)    All other components within normal limits  TROPONIN I (HIGH SENSITIVITY) - Abnormal; Notable for the following components:   Troponin I (High Sensitivity) 34 (*)    All other components within normal limits    EKG None  Radiology CT Angio Chest/Abd/Pel for Dissection W and/or W/WO Result Date: 07/24/2023 CLINICAL DATA:  Acute aortic syndrome suspected. Chest pain worsening in the last couple of days. The pain is central and left-sided and radiates to the back and left arm. EXAM: CT ANGIOGRAPHY CHEST, ABDOMEN AND PELVIS TECHNIQUE: Non-contrast CT of the chest was initially obtained. Multidetector CT imaging through the chest, abdomen and pelvis was performed using the standard protocol during bolus administration of intravenous contrast. Multiplanar reconstructed images and MIPs were obtained and reviewed to evaluate  the vascular anatomy. RADIATION DOSE REDUCTION: This exam was performed according to the departmental dose-optimization program which includes automated exposure control, adjustment of the mA and/or kV according to patient size and/or use of iterative reconstruction technique. CONTRAST:  OMNIPAQUE  IOHEXOL  350 MG/ML SOLN COMPARISON:  Same day chest radiograph; CT abdomen pelvis 10/20/2021; CTA chest 10/18/2021 FINDINGS: CTA CHEST FINDINGS Cardiovascular: Normal heart size. No pericardial effusion. Normal caliber thoracic aorta without intramural hematoma, penetrating atherosclerotic ulcer, or dissection. Mild aortic atherosclerotic plaque. AV fistula in the left arm. Mediastinum/Nodes: Trachea and esophagus are unremarkable. Lungs/Pleura: No focal consolidation, pleural effusion, or pneumothorax. Musculoskeletal: No acute fracture. Review of the MIP images confirms the above findings. CTA ABDOMEN AND PELVIS FINDINGS VASCULAR Aorta: Scattered calcified atherosclerotic plaque in the aorta and its mesenteric, renal, and iliac artery branches. There is severe narrowing of the proximal right renal artery. Otherwise no hemodynamically significant stenosis, aneurysm, or dissection. Review of the MIP images confirms the above findings. NON-VASCULAR Hepatobiliary: Cholecystectomy. Unremarkable liver and biliary tree. Pancreas: Unremarkable. Spleen: Unremarkable. Adrenals/Urinary Tract: Normal adrenal glands. No urinary calculi or hydronephrosis. Unremarkable bladder. Stomach/Bowel: Normal caliber large and small bowel. Stomach is within normal limits. Appendectomy. No bowel wall thickening. Lymphatic: No lymphadenopathy. Reproductive: Unremarkable.  No free intraperitoneal fluid or air. Other: Subcutaneous nodules and emphysema in the anterior abdominal wall likely due to injections. Musculoskeletal: No acute fracture. Review of the MIP images confirms the above findings. IMPRESSION: 1. No acute aortic syndrome. No  acute abnormality in the chest, abdomen, or pelvis. 2. 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 3. Aortic Atherosclerosis (ICD10-I70.0). Electronically Signed   By: Rozell Cornet M.D.   On: 07/24/2023 21:43   CT Head Wo Contrast Result Date: 07/24/2023 CLINICAL DATA:  Headache EXAM: CT HEAD WITHOUT CONTRAST TECHNIQUE: Contiguous axial images were obtained from the base of the skull through the vertex without intravenous contrast. RADIATION DOSE REDUCTION: This exam was performed according to the  departmental dose-optimization program which includes automated exposure control, adjustment of the mA and/or kV according to patient size and/or use of iterative reconstruction technique. COMPARISON:  MRI 07/01/2023, CT brain 07/02/2019, 06/27/2023 FINDINGS: Brain: No acute territorial infarction, hemorrhage or intracranial mass. 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 Stable ventricle size Vascular: No hyperdense vessels.  No unexpected calcification Skull: Normal. Negative for fracture or focal lesion. Sinuses/Orbits: No acute finding. Other: None IMPRESSION: No CT evidence for acute intracranial abnormality Electronically Signed   By: Esmeralda Hedge M.D.   On: 07/24/2023 21:39   DG Chest 1 View Result Date: 07/24/2023 CLINICAL DATA:  [id number] Chest pain [id number] EXAM: CHEST  1 VIEW COMPARISON:  July 15, 2023, October 18, 2021 FINDINGS: No focal airspace consolidation, pleural effusion, or pneumothorax. No cardiomegaly. No acute fracture or destructive lesion. IMPRESSION: No acute cardiopulmonary abnormality. Electronically Signed   By: Rance Burrows M.D.   On: 07/24/2023 19:07    Procedures Procedures  {Document cardiac monitor, telemetry assessment procedure when appropriate:1}  Medications Ordered in ED Medications  iohexol  (OMNIPAQUE ) 350 MG/ML injection 100 mL (100 mLs Intravenous Contrast Given 07/24/23 2134)  labetalol  (NORMODYNE ) tablet 300 mg (300 mg Oral Given  07/25/23 0004)  labetalol  (NORMODYNE ) injection 10 mg (10 mg Intravenous Given 07/24/23 2328)  amLODipine  (NORVASC ) tablet 10 mg (10 mg Oral Given 07/24/23 2327)  acetaminophen  (TYLENOL ) tablet 1,000 mg (1,000 mg Oral Given 07/25/23 0004)    ED Course/ Medical Decision Making/ A&P   {   Click here for ABCD2, HEART and other calculatorsREFRESH Note before signing :1}                              Medical Decision Making Risk OTC drugs. Prescription drug management.   ***  {Document critical care time when appropriate:1} {Document review of labs and clinical decision tools ie heart score, Chads2Vasc2 etc:1}  {Document your independent review of radiology images, and any outside records:1} {Document your discussion with family members, caretakers, and with consultants:1} {Document social determinants of health affecting pt's care:1} {Document your decision making why or why not admission, treatments were needed:1} Final Clinical Impression(s) / ED Diagnoses Final diagnoses:  Resistant hypertension  Nonintractable headache, unspecified chronicity pattern, unspecified headache type  Chest pain, unspecified type  ESRD (end stage renal disease) on dialysis [facility])    Rx / DC Orders ED Discharge Orders          Ordered    labetalol  (NORMODYNE ) 300 MG tablet  3 times daily        07/25/23 0008    amLODipine  (NORVASC ) 10 MG tablet  Daily        07/25/23 0008    isosorbide  dinitrate (ISORDIL ) 20 MG tablet  3 times daily        07/25/23 0008

## 2023-08-02 ENCOUNTER — Encounter: Payer: Self-pay | Admitting: Internal Medicine

## 2023-12-14 DEATH — deceased
